# Patient Record
Sex: Female | Born: 1940 | Race: White | Hispanic: No | Smoking: Former smoker
Health system: Southern US, Community
[De-identification: ages and names within clinical notes are randomized; demographics above are authoritative.]

## PROBLEM LIST (undated history)

## (undated) DIAGNOSIS — I8500 Esophageal varices without bleeding: Secondary | ICD-10-CM

## (undated) DIAGNOSIS — K449 Diaphragmatic hernia without obstruction or gangrene: Secondary | ICD-10-CM

## (undated) DIAGNOSIS — K7460 Unspecified cirrhosis of liver: Secondary | ICD-10-CM

## (undated) DIAGNOSIS — R188 Other ascites: Secondary | ICD-10-CM

## (undated) DIAGNOSIS — K921 Melena: Secondary | ICD-10-CM

## (undated) DIAGNOSIS — N186 End stage renal disease: Secondary | ICD-10-CM

## (undated) DIAGNOSIS — I10 Essential (primary) hypertension: Secondary | ICD-10-CM

## (undated) DIAGNOSIS — K219 Gastro-esophageal reflux disease without esophagitis: Secondary | ICD-10-CM

## (undated) DIAGNOSIS — Z820 Family history of epilepsy and other diseases of the nervous system: Secondary | ICD-10-CM

## (undated) DIAGNOSIS — I5181 Takotsubo syndrome: Secondary | ICD-10-CM

## (undated) DIAGNOSIS — D649 Anemia, unspecified: Secondary | ICD-10-CM

## (undated) DIAGNOSIS — N189 Chronic kidney disease, unspecified: Secondary | ICD-10-CM

## (undated) DIAGNOSIS — M719 Bursopathy, unspecified: Secondary | ICD-10-CM

## (undated) DIAGNOSIS — N200 Calculus of kidney: Secondary | ICD-10-CM

## (undated) DIAGNOSIS — E119 Type 2 diabetes mellitus without complications: Secondary | ICD-10-CM

## (undated) DIAGNOSIS — C649 Malignant neoplasm of unspecified kidney, except renal pelvis: Secondary | ICD-10-CM

## (undated) DIAGNOSIS — Z9189 Other specified personal risk factors, not elsewhere classified: Secondary | ICD-10-CM

## (undated) DIAGNOSIS — K766 Portal hypertension: Secondary | ICD-10-CM

## (undated) DIAGNOSIS — E7800 Pure hypercholesterolemia, unspecified: Secondary | ICD-10-CM

## (undated) DIAGNOSIS — K2990 Gastroduodenitis, unspecified, without bleeding: Secondary | ICD-10-CM

## (undated) HISTORY — DX: Takotsubo syndrome: I51.81

## (undated) HISTORY — PX: HIP SURGERY: SHX245

## (undated) HISTORY — DX: Calculus of kidney: N20.0

## (undated) HISTORY — DX: Pure hypercholesterolemia, unspecified: E78.00

## (undated) HISTORY — PX: TENCKHOFF CATHETER INSERTION: SHX5251

## (undated) HISTORY — PX: BACK SURGERY: SHX140

## (undated) HISTORY — DX: Essential (primary) hypertension: I10

## (undated) HISTORY — PX: PARTIAL HYSTERECTOMY: SHX80

## (undated) HISTORY — DX: Bursopathy, unspecified: M71.9

## (undated) HISTORY — DX: Gastro-esophageal reflux disease without esophagitis: K21.9

## (undated) HISTORY — DX: Type 2 diabetes mellitus without complications: E11.9

## (undated) HISTORY — DX: Family history of epilepsy and other diseases of the nervous system: Z82.0

## (undated) HISTORY — PX: ABDOMINAL HYSTERECTOMY: SHX81

## (undated) HISTORY — PX: CHOLECYSTECTOMY: SHX55

---
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
## 1999-01-13 ENCOUNTER — Encounter: Admission: RE | Admit: 1999-01-13 | Discharge: 1999-04-13 | Payer: Self-pay | Admitting: Anesthesiology

## 2001-07-17 ENCOUNTER — Encounter: Payer: Self-pay | Admitting: Orthopedic Surgery

## 2001-07-17 ENCOUNTER — Ambulatory Visit (HOSPITAL_COMMUNITY): Admission: RE | Admit: 2001-07-17 | Discharge: 2001-07-17 | Payer: Self-pay | Admitting: Orthopedic Surgery

## 2001-11-28 ENCOUNTER — Encounter: Payer: Self-pay | Admitting: Family Medicine

## 2001-11-28 ENCOUNTER — Ambulatory Visit (HOSPITAL_COMMUNITY): Admission: RE | Admit: 2001-11-28 | Discharge: 2001-11-28 | Payer: Self-pay | Admitting: Family Medicine

## 2001-12-14 ENCOUNTER — Ambulatory Visit (HOSPITAL_COMMUNITY): Admission: RE | Admit: 2001-12-14 | Discharge: 2001-12-14 | Payer: Self-pay | Admitting: Internal Medicine

## 2001-12-14 HISTORY — PX: COLONOSCOPY: SHX174

## 2002-11-06 ENCOUNTER — Encounter: Payer: Self-pay | Admitting: Family Medicine

## 2002-11-06 ENCOUNTER — Ambulatory Visit (HOSPITAL_COMMUNITY): Admission: RE | Admit: 2002-11-06 | Discharge: 2002-11-06 | Payer: Self-pay | Admitting: Family Medicine

## 2003-01-30 ENCOUNTER — Encounter: Payer: Self-pay | Admitting: Family Medicine

## 2003-01-30 ENCOUNTER — Ambulatory Visit (HOSPITAL_COMMUNITY): Admission: RE | Admit: 2003-01-30 | Discharge: 2003-01-30 | Payer: Self-pay | Admitting: Family Medicine

## 2003-10-03 ENCOUNTER — Emergency Department (HOSPITAL_COMMUNITY): Admission: EM | Admit: 2003-10-03 | Discharge: 2003-10-03 | Payer: Self-pay | Admitting: Emergency Medicine

## 2003-10-05 ENCOUNTER — Inpatient Hospital Stay (HOSPITAL_COMMUNITY): Admission: EM | Admit: 2003-10-05 | Discharge: 2003-10-06 | Payer: Self-pay | Admitting: Emergency Medicine

## 2003-10-07 ENCOUNTER — Inpatient Hospital Stay (HOSPITAL_COMMUNITY): Admission: AD | Admit: 2003-10-07 | Discharge: 2003-10-09 | Payer: Self-pay | Admitting: Family Medicine

## 2003-10-14 ENCOUNTER — Ambulatory Visit (HOSPITAL_COMMUNITY): Admission: RE | Admit: 2003-10-14 | Discharge: 2003-10-14 | Payer: Self-pay | Admitting: Family Medicine

## 2005-06-15 ENCOUNTER — Ambulatory Visit: Payer: Self-pay | Admitting: Internal Medicine

## 2005-06-15 ENCOUNTER — Ambulatory Visit (HOSPITAL_COMMUNITY): Admission: RE | Admit: 2005-06-15 | Discharge: 2005-06-15 | Payer: Self-pay | Admitting: Internal Medicine

## 2005-06-15 HISTORY — PX: COLONOSCOPY: SHX174

## 2005-09-15 ENCOUNTER — Ambulatory Visit: Payer: Self-pay | Admitting: Orthopedic Surgery

## 2006-06-13 ENCOUNTER — Ambulatory Visit (HOSPITAL_COMMUNITY): Admission: RE | Admit: 2006-06-13 | Discharge: 2006-06-13 | Payer: Self-pay | Admitting: Family Medicine

## 2006-11-10 ENCOUNTER — Inpatient Hospital Stay (HOSPITAL_COMMUNITY): Admission: AD | Admit: 2006-11-10 | Discharge: 2006-11-14 | Payer: Self-pay | Admitting: Family Medicine

## 2007-01-02 ENCOUNTER — Ambulatory Visit (HOSPITAL_COMMUNITY): Admission: RE | Admit: 2007-01-02 | Discharge: 2007-01-02 | Payer: Self-pay | Admitting: Urology

## 2007-05-31 ENCOUNTER — Ambulatory Visit: Payer: Self-pay | Admitting: Internal Medicine

## 2007-06-12 ENCOUNTER — Ambulatory Visit: Payer: Self-pay | Admitting: Internal Medicine

## 2007-06-12 ENCOUNTER — Ambulatory Visit (HOSPITAL_COMMUNITY): Admission: RE | Admit: 2007-06-12 | Discharge: 2007-06-12 | Payer: Self-pay | Admitting: Internal Medicine

## 2007-06-12 HISTORY — PX: ESOPHAGOGASTRODUODENOSCOPY: SHX1529

## 2007-06-15 ENCOUNTER — Ambulatory Visit (HOSPITAL_COMMUNITY): Admission: RE | Admit: 2007-06-15 | Discharge: 2007-06-15 | Payer: Self-pay | Admitting: Family Medicine

## 2007-06-26 ENCOUNTER — Encounter (HOSPITAL_COMMUNITY): Admission: RE | Admit: 2007-06-26 | Discharge: 2007-07-26 | Payer: Self-pay | Admitting: Internal Medicine

## 2008-06-19 ENCOUNTER — Ambulatory Visit (HOSPITAL_COMMUNITY): Admission: RE | Admit: 2008-06-19 | Discharge: 2008-06-19 | Payer: Self-pay | Admitting: Family Medicine

## 2008-10-28 ENCOUNTER — Ambulatory Visit (HOSPITAL_COMMUNITY): Admission: RE | Admit: 2008-10-28 | Discharge: 2008-10-28 | Payer: Self-pay | Admitting: Family Medicine

## 2009-01-20 ENCOUNTER — Ambulatory Visit (HOSPITAL_COMMUNITY): Admission: RE | Admit: 2009-01-20 | Discharge: 2009-01-20 | Payer: Self-pay | Admitting: Family Medicine

## 2009-03-24 ENCOUNTER — Encounter (INDEPENDENT_AMBULATORY_CARE_PROVIDER_SITE_OTHER): Payer: Self-pay | Admitting: *Deleted

## 2009-05-14 DIAGNOSIS — I10 Essential (primary) hypertension: Secondary | ICD-10-CM | POA: Insufficient documentation

## 2009-05-14 DIAGNOSIS — K219 Gastro-esophageal reflux disease without esophagitis: Secondary | ICD-10-CM | POA: Insufficient documentation

## 2009-05-14 DIAGNOSIS — E7800 Pure hypercholesterolemia, unspecified: Secondary | ICD-10-CM | POA: Insufficient documentation

## 2009-05-14 DIAGNOSIS — N200 Calculus of kidney: Secondary | ICD-10-CM | POA: Insufficient documentation

## 2009-05-14 DIAGNOSIS — M7150 Other bursitis, not elsewhere classified, unspecified site: Secondary | ICD-10-CM | POA: Insufficient documentation

## 2009-05-14 DIAGNOSIS — K8020 Calculus of gallbladder without cholecystitis without obstruction: Secondary | ICD-10-CM | POA: Insufficient documentation

## 2009-05-14 DIAGNOSIS — E119 Type 2 diabetes mellitus without complications: Secondary | ICD-10-CM | POA: Insufficient documentation

## 2009-05-14 DIAGNOSIS — G43909 Migraine, unspecified, not intractable, without status migrainosus: Secondary | ICD-10-CM | POA: Insufficient documentation

## 2009-05-15 ENCOUNTER — Ambulatory Visit: Payer: Self-pay | Admitting: Internal Medicine

## 2009-05-15 DIAGNOSIS — K921 Melena: Secondary | ICD-10-CM | POA: Insufficient documentation

## 2009-05-15 DIAGNOSIS — Z862 Personal history of diseases of the blood and blood-forming organs and certain disorders involving the immune mechanism: Secondary | ICD-10-CM | POA: Insufficient documentation

## 2009-05-16 ENCOUNTER — Encounter: Payer: Self-pay | Admitting: Internal Medicine

## 2009-05-19 ENCOUNTER — Encounter: Payer: Self-pay | Admitting: Internal Medicine

## 2009-05-30 HISTORY — PX: COLONOSCOPY: SHX174

## 2009-06-02 ENCOUNTER — Ambulatory Visit: Payer: Self-pay | Admitting: Internal Medicine

## 2009-06-02 ENCOUNTER — Encounter: Payer: Self-pay | Admitting: Internal Medicine

## 2009-06-02 ENCOUNTER — Ambulatory Visit (HOSPITAL_COMMUNITY): Admission: RE | Admit: 2009-06-02 | Discharge: 2009-06-02 | Payer: Self-pay | Admitting: Internal Medicine

## 2009-06-06 ENCOUNTER — Encounter: Payer: Self-pay | Admitting: Internal Medicine

## 2009-06-24 ENCOUNTER — Ambulatory Visit (HOSPITAL_COMMUNITY): Admission: RE | Admit: 2009-06-24 | Discharge: 2009-06-24 | Payer: Self-pay | Admitting: Family Medicine

## 2009-08-07 ENCOUNTER — Ambulatory Visit (HOSPITAL_COMMUNITY): Admission: RE | Admit: 2009-08-07 | Discharge: 2009-08-07 | Payer: Self-pay | Admitting: Family Medicine

## 2009-08-08 ENCOUNTER — Ambulatory Visit (HOSPITAL_COMMUNITY): Admission: RE | Admit: 2009-08-08 | Discharge: 2009-08-08 | Payer: Self-pay | Admitting: Family Medicine

## 2009-09-04 ENCOUNTER — Ambulatory Visit (HOSPITAL_COMMUNITY): Admission: RE | Admit: 2009-09-04 | Discharge: 2009-09-04 | Payer: Self-pay | Admitting: Family Medicine

## 2010-06-13 ENCOUNTER — Emergency Department (HOSPITAL_COMMUNITY): Admission: EM | Admit: 2010-06-13 | Discharge: 2010-06-13 | Payer: Self-pay | Admitting: Emergency Medicine

## 2010-06-25 ENCOUNTER — Ambulatory Visit (HOSPITAL_COMMUNITY): Admission: RE | Admit: 2010-06-25 | Discharge: 2010-06-25 | Payer: Self-pay | Admitting: Family Medicine

## 2010-08-13 ENCOUNTER — Ambulatory Visit (HOSPITAL_COMMUNITY)
Admission: RE | Admit: 2010-08-13 | Discharge: 2010-08-13 | Payer: Self-pay | Source: Home / Self Care | Attending: Physical Medicine and Rehabilitation | Admitting: Physical Medicine and Rehabilitation

## 2010-09-04 ENCOUNTER — Encounter
Admission: RE | Admit: 2010-09-04 | Discharge: 2010-09-04 | Payer: Self-pay | Source: Home / Self Care | Attending: Unknown Physician Specialty | Admitting: Unknown Physician Specialty

## 2010-09-04 LAB — BASIC METABOLIC PANEL
BUN: 29 mg/dL — ABNORMAL HIGH (ref 6–23)
CO2: 26 mEq/L (ref 19–32)
Calcium: 9.2 mg/dL (ref 8.4–10.5)
Chloride: 103 mEq/L (ref 96–112)
Creatinine, Ser: 1.27 mg/dL — ABNORMAL HIGH (ref 0.4–1.2)
GFR calc Af Amer: 50 mL/min — ABNORMAL LOW (ref >60)
GFR calc non Af Amer: 42 mL/min — ABNORMAL LOW (ref >60)
Glucose, Bld: 166 mg/dL — ABNORMAL HIGH (ref 70–99)
Potassium: 4.9 mEq/L (ref 3.5–5.1)
Sodium: 137 mEq/L (ref 135–145)

## 2010-09-08 ENCOUNTER — Ambulatory Visit
Admission: RE | Admit: 2010-09-08 | Discharge: 2010-09-08 | Payer: Self-pay | Source: Home / Self Care | Attending: Unknown Physician Specialty | Admitting: Unknown Physician Specialty

## 2010-09-14 LAB — GLUCOSE, CAPILLARY
Glucose-Capillary: 156 mg/dL — ABNORMAL HIGH (ref 70–99)
Glucose-Capillary: 138 mg/dL — ABNORMAL HIGH (ref 70–99)

## 2010-09-14 LAB — POCT HEMOGLOBIN-HEMACUE: Hemoglobin: 9.4 g/dL — ABNORMAL LOW (ref 12.0–15.0)

## 2010-09-20 ENCOUNTER — Encounter: Payer: Self-pay | Admitting: Internal Medicine

## 2010-09-21 ENCOUNTER — Encounter: Payer: Self-pay | Admitting: Family Medicine

## 2010-12-03 LAB — CBC
Platelets: 302 10*3/uL (ref 150–400)
RBC: 3.86 MIL/uL — ABNORMAL LOW (ref 3.87–5.11)
WBC: 10.4 10*3/uL (ref 4.0–10.5)

## 2010-12-03 LAB — GLUCOSE, CAPILLARY: Glucose-Capillary: 107 mg/dL — ABNORMAL HIGH (ref 70–99)

## 2011-01-12 NOTE — H&P (Signed)
NAME:  [name], [name]           ACCOUNT NO.:  [id number]   MEDICAL RECORD NO.:  [id number]          PATIENT TYPE:  AMB   LOCATION:  DAY                           FACILITY:  [facility]   PHYSICIAN:  R. Garfield Cornea, M.D. DATE OF BIRTH:  1941/03/27   DATE OF ADMISSION:  DATE OF DISCHARGE:  LH                              HISTORY & PHYSICAL   REASON FOR CONSULTATION:  Hemoccult positive stool and refractory GERD.   HISTORY OF PRESENT ILLNESS:  [name] is a 70 year old Caucasian  female who has been having heartburn and indigestion as well as  rumination and regurgitation for about a year now.  She had been on PPI.  This was increased to Prevacid 30 mg b.i.d.  Recently, she continues to  complain of breakthrough symptoms, also complains of epigastric  tenderness and postprandial bloating.  She denies any nausea or  vomiting.  Denies any dysphagia or odynophagia.  Denies any anorexia or  early satiety.  She was started on Carafate 1 gram q.i.d. which does  seem to help some, although her symptoms are not completely resolved.  She generally has between one to two bowel movements a day.  Denies any  rectal bleeding or melena.  She is on aspirin 81 mg daily.  She was  found to have Hemoccult positive stools.  She is not on any other NSAID  at this time.   She has a family history of colorectal carcinoma.  She had her last high-  risk screening colonoscopy June 15, 2005, which showed minimal  internal hemorrhoids and otherwise normal exam.  Previous exam back in  2003 by [name] showed an inflammatory polyp at 30 and 35 cm which was  removed with inflammatory.  She has history of tubular adenoma on  colonoscopy in 2000.   Her weight is stable.   She had a CT scan abdomen that was with and without contrast on Jan 02, 2007, which showed bilateral renal calculi with bilateral renal cyst  including complicated cyst.  She was seen by [name] for this.  The  exam was otherwise  normal.  She had LFTs on February 02, 2007, which showed  an ALT of 41 otherwise normal.  She had a normal BMP except for a  glucose of 128 and a CBC which showed a white blood cell count of 11.4  on April 26, 2007.  She had a normal iron, TIBC and a ferritin of 53.   PAST MEDICAL AND SURGICAL HISTORY:  1. Chronic GERD.  2. Hypercholesterolemia.  3. Diabetes mellitus.  4. Hypertension.  5. Bursitis.  6. Migraine headaches.  7. Renal lithiasis.  She is followed by [name].  8. Partial hysterectomy.   1. She had right hip surgery.  2. She has had a cholecystectomy for cholelithiasis.   CURRENT MEDICATIONS:  1. Verapamil 240 mg daily.  2. Hydrochlorothiazide 25 mg daily.  3. Lipitor 20 mg daily.  4. Prevacid 30 mg b.i.d.  5. Clarinex 5 mg daily.  6. Carafate 1 gram a.c. and h.s.  7. Lovaza 2 grams b.i.d.  8. Darvocet N 100  p.r.n.  9. Aspirin 81 mg daily.  10.Metformin 500 mg b.i.d.   ALLERGIES:  NO KNOWN DRUG ALLERGIES.   FAMILY HISTORY:  Positive for a mother diagnosed with colon cancer in  her late 23s.  Father deceased due to pancreatic carcinoma at age 75.   SOCIAL HISTORY:  [name] is divorced, she lives alone.  She has  three healthy daughters.  She is retired from [employer].  She has a 30  pack-year history of tobacco use.  Denies any alcohol or drug use.   REVIEW OF SYSTEMS:  See HPI, otherwise negative.   PHYSICAL EXAMINATION:  VITAL SIGNS: Weight 129-1/2 pound, height 62  inches, temperature 98.3, blood pressure 120/80, pulse 88.  GENERAL:  [name] is a 70 year old Caucasian female who is alert,  oriented, pleasant, cooperative in no acute distress.  HEENT:  Sclerae are clear, nonicteric.  Conjunctivae are [name].  Oropharynx [name] and moist without lesions.  NECK:  Supple without thyromegaly.  CHEST:  Heart regular rhythm.  Normal Q000111Q no murmurs, clicks, rubs or  gallops.  LUNGS:  Clear to auscultation bilaterally.  ABDOMEN:  Positive bowel sounds  x4.  No bruits auscultated.  Soft,  nontender, nondistended without palpable mass or hepatosplenomegaly.  No  rebound or guarding.  EXTREMITIES:  Without clubbing or edema bilaterally.  SKIN:  [name], warm, dry without rash or jaundice.   IMPRESSION:  [name] is a 70 year old female with refractory GERD  symptoms and epigastric pain.  She has had some response to b.i.d. PPI  and Carafate, although not completely would be concerned with gastritis  or peptic ulcer disease.  She is also found to have Hemoccult positive  stool.  Her last colonoscopy was two years ago, and I do not feel she  needs repeat colonoscopy at this time.   PLAN:  1. EGD with [name] in the near future.  I have discussed the      procedure including risks and benefits which include but are not      limited to bleeding, infection, perforation, drug reaction.  She      agrees to plan and consent      will be obtained.  2. No aspirin 3 days prior to procedure.  3. Continue Prevacid 30 mg b.i.d.      [name], N.P.      [name], M.D.  Electronically Signed    KJ/MEDQ  D:  05/31/2007  T:  06/01/2007  Job:  [id number]   cc:   Marie Reaper A. Wolfgang Phoenix, MD  Fax: [phone]

## 2011-01-12 NOTE — Op Note (Signed)
NAME:  [name], [name]           ACCOUNT NO.:  [id number]   MEDICAL RECORD NO.:  [id number]          PATIENT TYPE:  AMB   LOCATION:  DAY                           FACILITY:  [facility]   PHYSICIAN:  R. Garfield Cornea, M.D. DATE OF BIRTH:  March 24, 1941   DATE OF PROCEDURE:  06/12/2007  DATE OF DISCHARGE:                               OPERATIVE REPORT   PROCEDURE:  Diagnostic esophagogastroduodenoscopy.   ENDOSCOPIST:  Cristopher Estimable. Rourk, M.D.   INDICATIONS FOR PROCEDURE:  A 70 year old lady with type 2 diabetes  mellitus with regurgitation, refractory reflux symptoms, and early  satiety.  She is Hemoccult-positive with positive family history of  colon cancer.  Last colonoscopy was 2 years ago without significant  findings.  From April 27, 2007 she is not anemic with H&H of 12.5 and  38.2, MCV 88.2.  She has had an melena, hematochezia, or hematemesis.  EGD is now being done.  This approach has been discussed with the  patient at length.  The potential risks, benefits, and alternatives have  been reviewed, questions answered, she is agreeable.  Please see the  documentation in the medical record.   PROCEDURE NOTE:  O2 saturation, blood pressure, pulse, and respirations  monitored throughout the entire procedure.   CONSCIOUS SEDATION:  Versed 3 mg IV, Demerol 75 mg IV in divided doses.   INSTRUMENT:  Pentax video chip system.   EGD FINDINGS:  Examination of the tubular esophagus revealed a patulous  EG junction.  The esophageal mucosa appeared entirely normal.  There was  a common cavity phenomena between the distal esophagus and stomach.  The  EG junction was very easily traversed.   STOMACH:  The gastric cavity was empty.  It insufflated well with air.  A thorough examination of the gastric mucosa including a retroflex view  of the proximal stomach and esophagogastric junction again demonstrated  a patulous EG junction with a moderate sized hiatal hernia, otherwise  gastric mucosa  appeared normal.  Pylorus was patent and easily  traversed.  Examination of the bulb and second portion revealed no  abnormalities.   THERAPEUTIC/DIAGNOSTIC MANEUVERS:  None.   The patient tolerated the procedure well and was reacted in endoscopy.   IMPRESSION:  Normal esophagus/patulous EG junction, moderate sized  hiatal hernia, otherwise normal stomach, D1, D2.   RECOMMENDATIONS:  1. Will proceed with a solid phase gastric emptying study to rule out      gastroparesis as a contributing factor to her upper GI tract      symptoms.  2. Continue Prevacid 30 mg orally b.i.d. for the time being.  3. We will have her return 3 more Hemoccult cards.  If any are      positive, would advocate a repeat colonoscopy in the future as      well.  4. Further recommendations to follow.      Bridgette Habermann, M.D.  Electronically Signed     RMR/MEDQ  D:  06/12/2007  T:  06/13/2007  Job:  [id number]   cc:   Nicki Reaper A. Wolfgang Phoenix, MD  Fax: [phone]

## 2011-01-15 NOTE — Discharge Summary (Signed)
[name], [name]           ACCOUNT NO.:  [id number]   MEDICAL RECORD NO.:  [id number]          PATIENT TYPE:  INP   LOCATION:  A332                          FACILITY:  [facility]   PHYSICIAN:  Margaretmary Eddy, M.D.DATE OF BIRTH:  [birth year]   DATE OF ADMISSION:  11/10/2006  DATE OF DISCHARGE:  03/17/2008LH                               DISCHARGE SUMMARY   FINAL DIAGNOSIS:  1. Pyelonephritis.  2. Type 2 diabetes.  3. Hypertension.  4. Renal stones.   DISPOSITION:  1. Patient discharged home.  2. Darvocet N-100 q. 04/06 p.r.n. for pain due to sensitivity to      hydrocodone.  3. Antibiotics to be selected based on culture results from the      office, of note culture here was negative.  However, this was after      the patient had already received an injection of Rocephin in the      office.  4. Follow up in the office next week for follow-up p.r.n. and revisit.   INITIAL HISTORY AND PHYSICAL:  Please see H&P as dictated   HOSPITAL COURSE:  This patient is a 70 year old white female with  history of type 2 diabetes, hypertension and a clinical pyelonephritis  diagnosed one month ago.  At that time, the patient grew out Proteus  which was sensitive to cephalosporins.  She was given a 10-day  prescription she felt much better.  She even had a follow-up urinalysis  which was normal.  She returned to the office on Thursday with fever,  flank pain and vomiting.  The patient was given injection Rocephin.  She  came back 6 hours later still feeling poorly.  She had a T-max of 103.  We admitted her to the hospital.  White blood count was 12,000.  Urinalysis showed 20-30 whites per high-power field.  We did a renal  ultrasound which showed a stone calculus in the left kidney area.  However, the patient has history of chronic stones.  There was no  obstructive stone present.  The patient was given IV fluids for  rehydration purposes along with IV Cipro and Rocephin.  Over the next  40  hours she improved nicely.  Her back pain diminished.  Her high fevers  came down to normal.  Today, the day of discharge, she still has slight  flank tenderness.  She has no nausea, eating well, feeling good.  She is  ready to go home.  The patient is discharged home with diagnosis and  disposition as noted above.  As noted, we will call an antibiotic to the  [facility] based on culture results in the office.      Margaretmary Eddy, M.D.  Electronically Signed     WSL/MEDQ  D:  11/14/2006  T:  11/14/2006  Job:  [id number]

## 2011-01-15 NOTE — Group Therapy Note (Signed)
NAME:  [name], [name]           ACCOUNT NO.:  [id number]   MEDICAL RECORD NO.:  [id number]          PATIENT TYPE:  INP   LOCATION:  [id number]                          FACILITY:  [facility]   PHYSICIAN:  Audria Nine, M.D.DATE OF BIRTH:  Apr 30, 1941   DATE OF PROCEDURE:  11/12/2006  DATE OF DISCHARGE:                                 PROGRESS NOTE   SUBJECTIVE:  [name] feels better today.  She denies any fevers or  chills.  Her back pain has improved. There was pain control although she  feels a little constipated and would like to have Dulcolax.   OBJECTIVE:  GENERAL:  Conscious, alert, comfortable not in acute  distress. VITAL SIGNS:  Blood pressure 109/62, pulse 76, respirations  20, temperature 97.6.  Blood sugars range between 160-170.  HEENT EXAM:  Normocephalic, atraumatic.  Oral mucosa was moist with no exudate.  NECK:  Supple.  No JVD.  LUNGS:  Clear clinically.  HEART:  S1-S2  regular.  ABDOMEN:  Soft.  EXTREMITIES:  No edema.   LABORATORY/DA GNOSTIC DATA:  White blood cell count was 11.2, hemoglobin  11.7, hematocrit 35.3, platelet count was 237 with no left shift.   ASSESSMENT AND PLAN:  Acute pyelonephritis, negative blood cultures thus  far.  The patient's temperature has improved.  Overall symptoms have  improved.  We will continue on current IV antibiotics with Ceftriaxone  and Cipro.  We do not have any identified organisms as of yet.  We will  give a Dulcolax suppository for her bowel movements.  Overall she  remains stable.      Audria Nine, M.D.  Electronically Signed     AM/MEDQ  D:  11/12/2006  T:  11/12/2006  Job:  [id number]

## 2011-01-15 NOTE — Group Therapy Note (Signed)
NAME:  KHLOEI, PALELLA           ACCOUNT NO.:  [id number]   MEDICAL RECORD NO.:  [id number]          PATIENT TYPE:  INP   LOCATION:  [id number]                          FACILITY:  [facility]   PHYSICIAN:  Audria Nine, M.D.DATE OF BIRTH:  01-31-1941   DATE OF PROCEDURE:  11/13/2006  DATE OF DISCHARGE:                                 PROGRESS NOTE   SUBJECTIVE:  The patient is doing fairly well, denies any fevers or  chills.  Back pain is improved.  She is yet to have any bowel movement.   OBJECTIVE:  GENERAL:  Conscious, alert, comfortable, in no acute  distress.   LABORATORY DATA:  White blood cell count is improved to 7.6, hemoglobin  10.8, hematocrit 32.2, platelet count 351 with no left shift.  Basic  metabolic profile was normal except for a glucose of 164, calcium was  8.8.   ASSESSMENT:  Acute pyelonephritis involving the left kidney.  The  patient has negative blood cultures thus far.  Temperature is improved.  Continue on IV antibiotics with Ceftriaxone and Cipro.  Also continue IV  fluids.  Dr. Wolfgang Phoenix will be back in the morning in time to see the  patient.      Audria Nine, M.D.  Electronically Signed     AM/MEDQ  D:  11/13/2006  T:  11/13/2006  Job:  [id number]

## 2011-01-15 NOTE — Discharge Summary (Signed)
NAME:  [name], Marie A                     ACCOUNT NO.:  o   MEDICAL RECORD NO.:  [id number]                   PATIENT TYPE:  INP   LOCATION:  A309                                 FACILITY:  [facility]   PHYSICIAN:  Micah Flesher. Halm, D.O.                DATE OF BIRTH:  [birth year]   DATE OF ADMISSION:  10/05/2003  DATE OF DISCHARGE:  10/06/2003                                 DISCHARGE SUMMARY   FINAL DIAGNOSES:  1. Epistaxis.  2. Anemia.  3. Hypovolemia.   BRIEF HISTORY:  The patient is a 70 year old female who presented to the  emergency room with recurrent and moderately severe epistaxis.  Hemostasis  was obtained in the emergency room without packing.  The patient was  orthostatic and anemic in the emergency room and was admitted to the  hospital for further observation.   HOSPITAL COURSE:  [name] was admitted for IV hydration.  She did well  in her 24 hours of being in the hospital.  She had no further epistaxis.  Her aspirin products and Clarinex were discontinued.  She was placed on  q.i.d. nasal saline solution and her activity was limited.   The patient had repeat hemoglobin on the day of discharge which revealed a  hemoglobin of 8.5 mg/dL, down from her presentation of 9.4.  This was likely  due to her acute bleeding and her hydration overnight.  Her vital signs  remained stable with blood pressure in the 123456 systolic range.  Her  heart rate came down from 112-80 with hydration.   The patient was discharged in stable condition.  Once we were sure she was  not going to continue to bleed.   DISCHARGE MEDICATIONS:  1. All of her previous medications including:     A. Verapamil.     B. Maxzide.     C. Darvocet.     D. Prevacid.     E. Lipitor.     F. Wellbutrin.  2. She was asked to discontinue her daily aspirin as well as Clarinex due to     its antihistamine and possible drying effects.  3. Prescription for Chromagen capsules to take once a day for the next  2-3     months for her anemia.  She is to get this filled if she is unable to     take over-the-counter iron sulfate 325 mg t.i.d. with orange juice.  4. She was asked to use saline nose spray q.i.d.   FOLLOWUP:  She is to see Dr. Wolfgang Phoenix in 3-5 days for recheck and to have  arrangements confirmed for an ENT evaluation which are apparently in  progress.     ___________________________________________                                         Micah Flesher.  Cleta Alberts, D.O.   [facility]/MEDQ  D:  10/06/2003  T:  10/07/2003  Job:  [id number]   cc:   Nicki Reaper A. Wolfgang Phoenix, M.D.  [address], [place]  [place] [address]  Fax: [phone]

## 2011-01-15 NOTE — Procedures (Signed)
NAME:  [name], [name]                     ACCOUNT NO.:  [id number]   MEDICAL RECORD NO.:  [id number]                   PATIENT TYPE:  INP   LOCATION:  A336                                 FACILITY:  [facility]   PHYSICIAN:  Margaretmary Eddy, M.D.             DATE OF BIRTH:  1940-10-28   DATE OF PROCEDURE:  10/07/2003  DATE OF DISCHARGE:  10/09/2003                                EKG INTERPRETATION   FINDINGS:  Electrocardiogram reveals normal sinus rhythm with nonspecific ST-  T changes.      ___________________________________________                                            Margaretmary Eddy, M.D.   WSL/MEDQ  D:  12/01/2003  T:  12/02/2003  Job:  [id number]

## 2011-01-15 NOTE — Op Note (Signed)
[facility]  Patient:    [name], [name] Visit Number: [id number] MRN: [id number]          Service Type: END Location: DAY Attending Physician:  Bridgette Habermann Dictated by:   Garfield Cornea, M.D. Proc. Date: 12/14/01 Admit Date:  12/14/2001   CC:         __________________   Operative Report  PROCEDURE:  Colonoscopy and snare polypectomy.  INDICATIONS FOR PROCEDURE:  The patient is a 70 year old lady with a history of adenomatous polyps removed from her left colon in 2000. She returns for surveillance. She is devoid of any lower GI tract symptoms. Colonoscopy as a surveillance maneuver has been discussed with [place] and the potential risks, benefits, and alternatives have been reviewed. Please see my handwritten H&P for more information.  MONITORING:  O2 saturation, blood pressure, pulse and respirations were monitored throughout the entirety of the procedure.  CONSCIOUS SEDATION:  Versed 3 mg IV, Demerol 75 mg IV in divided doses.  INSTRUMENT:  Olympus colonoscope.  FINDINGS:  Digital rectal examination revealed no abnormalities.  ENDOSCOPIC FINDINGS:  The prep was good.  RECTUM:  Examination of the rectal mucosa including retroflexed view of the anal verge revealed only internal hemorrhoids.  COLON:  The colonic mucosa was surveyed from the rectosigmoid junction through the left transverse right colon to the area of the appendiceal orifice, ileocecal valve and cecum. These structures were well seen and photographed. The patient was noted to have two 5 mm polyps at 30 and 35 cm. They were recovered with snare cautery. The remainder of the colonic mucosa appeared normal. From the level of the cecum and ileocecal valve (see photos), the scope was slowly and cautiously withdrawn. All previously mentioned mucosal surfaces were again seen. No other abnormalities were observed. The patient tolerated the procedure well and was  reactive.  IMPRESSION:  1. Internal hemorrhage, otherwise, normal rectum.  2. Polyps at 30 and 35 cm resected; the remainder of colonic mucosa appeared     normal.  RECOMMENDATIONS:  1. No aspirin or arthritis medications for 10 days.  2. Follow-up on path.  3. Further recommendations to follow. Dictated by:   Garfield Cornea, M.D. Attending Physician:  Bridgette Habermann DD:  12/14/01 TD:  12/14/01 Job: [phone] [id number]

## 2011-01-15 NOTE — H&P (Signed)
[name], [name]           ACCOUNT NO.:  [id number]   MEDICAL RECORD NO.:  [id number]          PATIENT TYPE:  INP   LOCATION:  [id number]                          FACILITY:  [facility]   PHYSICIAN:  Margaretmary Eddy, M.D.DATE OF BIRTH:  05/05/1941   DATE OF ADMISSION:  11/10/2006  DATE OF DISCHARGE:  LH                              HISTORY & PHYSICAL   CHIEF COMPLAINT:  Nausea, fever, back pain and abdominal pain.   SUBJECTIVE:  This patient is a 70 year old, white female with a history  of prior urinary tract infection a month ago, type 2 diabetes,  hypertension, reactive airways, hyperlipidemia, allergic rhinitis,  reflux and chronic back pain who presents to the office the day of  admission with acute distress. Over the last 48 hours, the patient has  developed nausea and flank pain.  This has been accompanied by fever and  chills.  The patient has felt very queasy with this. She has been aching  quite a bit and she notes a T-max of 101. Of note, the patient was just  treated for a urinary tract infection that was positive for Proteus a  month ago with Rocephin and then Ceftin which was sensitive. The patient  claims compliance with her current medications which include:   CURRENT MEDICATIONS:  1. Darvocet-N 100, 1 q.4-6 p.r.n. for mild pain.  2. Prevacid 30 mg 1 daily.  3. Clarinex 5 mg daily.  4. Lipitor 20 mg q.h.s.  5. Verapamil 240 mg p.o. q.a.m.  6. Hydrochlorothiazide 25 mg p.o. q.a.m.  7. Baby aspirin 81 mg 1 a day.  8. Ibuprofen p.r.n.  9. [place]or grams b.i.d.  10.Chantix 1 mg twice per day.  11.Glucophage 500 mg twice per day.   PAST SURGERIES:  Remote hysterectomy 1972.  Remote cholecystectomy, spur  heel surgery, remote epidurals, negative colonoscopy as of 2006.   FAMILY HISTORY:  Positive for colon cancer, hypertension, coronary  artery disease, pancreatic cancer and stroke.   ALLERGIES:  None known.   SOCIAL HISTORY:  The patient is divorced, unfortunately  has smoked up  until recently. No alcohol intake, three children.   REVIEW OF SYSTEMS:  Otherwise negative.   PHYSICAL EXAMINATION:  VITAL SIGNS:  Temperature 100.6, blood pressure  110/64,  significant malaise, weight 128, positive nausea.  HEENT:  Mild nasal congestion.  Pharynx normal.  NECK:  Supple.  LUNGS:  Clear.  No wheezes.  HEART:  Mild tachycardic.  ABDOMEN:  Diffuse mild pain, good bowel sounds, positive left flank  tenderness.  No significant right flank tenderness.  EXTREMITIES:  Thin.  NEUROLOGICAL:  No focal neurological deficits.   Urinalysis 6-8 whites per high-power field, blood work pending.   IMPRESSION:  1. Probable pyelonephritis.  2. Type 2 diabetes.  3. Hypertension.  4. Hyperlipidemia.  5. Chronic reflux.  6. History of reactive airways.   PLAN:  IV antibiotics, IV fluids.  Further orders as noted in the chart.      Margaretmary Eddy, M.D.  Electronically Signed     WSL/MEDQ  D:  11/10/2006  T:  11/11/2006  Job:  [id number]

## 2011-01-15 NOTE — H&P (Signed)
NAME:  [name], [name]                     ACCOUNT NO.:  [id number]   MEDICAL RECORD NO.:  [id number]                   PATIENT TYPE:  INP   LOCATION:  A309                                 FACILITY:  [facility]   PHYSICIAN:  Micah Flesher. Halm, D.O.                DATE OF BIRTH:  30-Dec-1940   DATE OF ADMISSION:  10/05/2003  DATE OF DISCHARGE:                                HISTORY & PHYSICAL   CHIEF COMPLAINT:  Bloody nose and weakness.   BRIEF HISTORY:  The patient is [name] 70 year old female who presents through the  emergency room with severe epistaxis.  The patient has had recurrent  episodes of epistaxis over the last two weeks and has nosebleeds every three  to five days during this time.  She states she has moderate amounts of blood  in previous episodes and this time she had severe amounts of blood in the  back of her throat and had difficulty stopping the bleeding.  In the  emergency room, she was orthostatic by blood pressure but really had no  significant symptoms of hypovolemia.  The patient was evaluated by the  emergency room doctor and noted to have mild anemia with [name] hemoglobin of 9.7  and [name] hematocrit of 28%.  Because of her orthostasis, tachycardia as well as  recurrent nosebleeds, we opted to admit [name] to the hospital for  further observation and recheck of her hemoglobin as well as intravenous  fluids and possible transfusion.   PAST MEDICAL HISTORY:  1. Non-insulin-dependent diabetes, diet controlled.  2. Hypertension.  3. Kidney stones.  4. Cholecystectomy.  5. Hysterectomy.  6. Hypercholesterolemia.  7. Gastroesophageal reflux.  8. Depression.   MEDICATIONS:  1. Wellbutrin XL formulation, she is unsure of the dose.  2. Lipitor.  We are unsure of the dose.  3. Prevacid, dose unsure of.  4. Clarinex one pill daily.  5. Verapamil daily.  6. Maxzide 25 one p.o. daily.  7. Aspirin 325 mg daily.  8. Motrin p.r.n.  9. Darvocet-N 100 p.r.n.   ALLERGIES:   No known drug allergies.   SOCIAL HISTORY:  The patient is [name] smoker, approximately one pack per day  most of her life.  She is living alone in an apartment where there is  electric heat.  She is divorced the last ten years and was married prior to  that for 30 years.  She denies any drug use or alcohol use.  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   REVIEW OF SYSTEMS:  The patient has had episodes of epistaxis as noted.  She  saw her family doctor yesterday as well as about [name] week ago.  Last week she  had cautery treatments performed to her nasal mucosa.  Arrangements were  being made for her to see an ENT specialist and are still pending.  She  denies any URI symptoms.  She has no GI  complaints other than being  nauseated with the clots in her throat and having swallowed some.  She also  has some complaints of [name] popping sensation in her left ear.   PHYSICAL EXAMINATION:  GENERAL:  The patient is in no distress.  She is very  cooperative and pleasant.  She is alert and oriented with [name] normal affect.  VITAL SIGNS:  Her blood pressure is 105/72, heart rate 112.  NOSE:  Shows evidence of fresh clots.  EARS:  She has [name] small effusion behind her left eardrum.  LUNGS:  She does have some mild wheezing noted on exam.  No rales or  rhonchi.  HEART:  Regular with no murmur.  NECK:  Supple with no adenopathy or thyromegaly or thyroid abnormality.  ABDOMEN:  Soft and nontender.  EXTREMITIES:  Unremarkable.  SKIN:  She does have rather pale-appearing skin but no obvious rash.   LABORATORY STUDIES:  Hemoglobin is 9.7 mg/dL, hematocrit 28%, platelets  413,000.  PT and PTT are normal.   IMPRESSION:  1. Epistaxis.  2. Anemia.  3. History of hypertension.  4. Diet controlled, diabetes.  5. Hypercholesterolemia.  6. Gastroesophageal reflux disease.   PLAN:  Admit [name] to the hospital for IV rehydration, and  observation for any recurrent bleeding.  We will provide her some  nasal  saline solution for hydration of the mucous membranes.  We will recheck her  hemoglobin and hematocrit in the morning and see if we can get by without  transfusing her.  If she is stable, we will plan to discharge her in the  next day or two and see the ear, nose and throat surgeons as an outpatient.   The overall plan has been reviewed with [name], and she is in  agreement.     ___________________________________________                                         Micah Flesher. Almyra Free  D:  10/05/2003  T:  10/05/2003  Job:  [id number]   cc:   Margaretmary Eddy, M.D.  [address] [place] [address]  Fax: [phone]

## 2011-01-15 NOTE — H&P (Signed)
NAME:  Marie [name], Marie [name]                     ACCOUNT NO.:  [id number]   MEDICAL RECORD NO.:  [id number]                   PATIENT TYPE:  INP   LOCATION:  A336                                 FACILITY:  [facility]   PHYSICIAN:  Margaretmary Eddy, M.D.             DATE OF BIRTH:  12-Aug-1941   DATE OF ADMISSION:  10/07/2003  DATE OF DISCHARGE:                                HISTORY & PHYSICAL   CHIEF COMPLAINT:  Vomiting and lightheadedness.   SUBJECTIVE:  This patient is [name] 70 year old female with [name] history of recent  epistaxis with multiple episodes over the past few weeks. Of note, she was  admitted to the hospital three days ago and spent the night after  experiencing [name] protracted bout of  nosebleeds. The patient was noted to be  orthostatic with [name] hemoglobin in the 9s. The patient was admitted overnight,  given IV fluids, and discharged home the following day, which was yesterday.  Today, the day of admission, the patient noted she was feeling relatively  well until the middle of the morning when she developed multiple episodes of  vomiting. This was accompanied by significant nausea. The patient did note  some abdominal discomfort in the left lower abdomen, but primarily her main  sensation was one of significant nausea and vomiting. Of note, the patient  had some achiness. She also noted some low-grade fever. The patient  mentioned no headache. She states that she is compliant with medications  which include Wellbutrin XL 150 mg daily, Lipitor daily, verapamil daily,  hydrochlorothiazide 25 mg daily, baby aspirin 81 mg daily.   ALLERGIES:  None unknown.   SOCIAL HISTORY:  The patient is [name] smoker, lives alone. She is divorced with  several children. No significant alcohol or drug use.   REVIEW OF SYSTEMS:  Otherwise negative.   PHYSICAL EXAMINATION:  VITAL SIGNS: BP 115/70, pulse rate in the range of  115-120. When the patient stands her systolic drops from [id number] to 94  diastolic.  She has obvious malaise, burping frequently, and initially  vomiting while in the exam room.  HEENT:  No bleeding currently from the nasal mucosa.  Mucous membranes  moist. Pharynx normal.  NECK: Supple.  LUNGS: Clear.  HEART: Regular rate and rhythm.  ABDOMEN: No significant tenderness to palpation in the upper abdomen.  However, in the lower abdomen the patient has significant discomfort.  PELVIC EXAM: The pain is localized primarily in the left lower quadrant  above the adnexal region. No CVA tenderness.  EXTREMITIES: Normal.   SIGNIFICANT LABORATORY:  CBC reveals 9.4 white blood count, hemoglobin 9.8  which is stable, platelet count normal. MET-7 reveals decent liver enzymes.  Urinalysis pending.   IMPRESSION:  1. Probable gastroenteritis. However, with left lower quadrant tenderness     must consider potential for diverticulitis. The patient is having [name] lot     of burping and belching, and symptoms of reflux accompanied by multiple  episodes of vomiting.  There is some bile staining noted to the vomiting.     The abdomen has good bowel sounds. The patient does not have any pain     reminiscent to any type of obstruction.  2. Epistaxis.  3. Anemia due to epistaxis with potential exacerbation of symptoms due to     anemia.  4. Hypertension, stable.  5. Chronic smoker.  6. Type 2 diabetes, controlled by diet alone.   PLAN:  As per orders.     ___________________________________________                                         Margaretmary Eddy, M.D.   WSL/MEDQ  D:  10/07/2003  T:  10/07/2003  Job:  [id number]

## 2011-01-15 NOTE — Op Note (Signed)
NAME:  Marie, [name]           ACCOUNT NO.:  [id number]   MEDICAL RECORD NO.:  [id number]          PATIENT TYPE:  AMB   LOCATION:  DAY                           FACILITY:  [facility]   PHYSICIAN:  R. Garfield Cornea, M.D. DATE OF BIRTH:  1941/05/08   DATE OF PROCEDURE:  06/15/2005  DATE OF DISCHARGE:                                 OPERATIVE REPORT   PROCEDURE:  High-risk colonoscopy.   INDICATIONS FOR PROCEDURE:  The patient is a 70 year old lady with no GI  symptoms whose family history is significant for her mother with colorectal  cancer. Colonoscopy is now being done. This approach has been discussed with  the patient at length. Potential risks, benefits, and alternatives have been  reviewed. She had colonoscopy approximately five years ago by me. She had a  couple of small polyps in her left colon removed. It turned out to be  inflammatory. Please see the documentation in the medical record.   PROCEDURE NOTE:  O2 saturation, blood pressure, pulse, and respirations were  monitored throughout the entire procedure. Conscious sedation with Versed 5  mg IV and Demerol 100 mg IV in divided doses.   INSTRUMENT:  Olympus video chip system.   FINDINGS:  Digital rectal exam revealed no abnormalities.   ENDOSCOPIC FINDINGS:  Prep was good.   Rectum:  Examination of the rectal mucosa including retroflexed view of the  anal verge revealed minimal internal hemorrhoids. Otherwise rectal mucosa  appeared normal.   Colon:  Colonic mucosa was surveyed from the rectosigmoid junction through  the left, transverse, and right colon to the area of the appendiceal  orifice, ileocecal valve, and cecum. These structures were well seen and  photographed for the record. From this level, the scope was slowly  withdrawn, and all previously mentioned mucosal surfaces were again seen.  The colonic mucosa appeared normal. The patient tolerated the procedure well  and was reactive to endoscopy.   IMPRESSION:  1.  Minimal internal hemorrhoids. Otherwise normal rectum.  2.  Normal colon.   RECOMMENDATIONS:  Repeat high-risk screening colonoscopy in five years.      Bridgette Habermann, M.D.  Electronically Signed     RMR/MEDQ  D:  06/15/2005  T:  06/15/2005  Job:  [id number]   cc:   Margaretmary Eddy, M.D.  Fax: [phone]

## 2011-01-15 NOTE — Discharge Summary (Signed)
NAME:  [name], [name]                     ACCOUNT NO.:  [id number]   MEDICAL RECORD NO.:  [id number]                   PATIENT TYPE:  INP   LOCATION:  A336                                 FACILITY:  [facility]   PHYSICIAN:  Margaretmary Eddy, M.D.             DATE OF BIRTH:  04-25-1941   DATE OF ADMISSION:  10/07/2003  DATE OF DISCHARGE:  10/09/2003                                 DISCHARGE SUMMARY   FINAL DIAGNOSES:  1. Viral gastritis.  2. Abdominal pain secondary to #1.  3. Recurrent epistaxis.  4. Anemia secondary to #3.  5. Hypertension.  6. Questionable pulmonary nodule on chest x-ray.   PLAN:  1. The patient is discharged home.  2. Hold blood pressure medications.  3. Maintain other chronic medications.  4. Hold aspirin.  5. Iron gluconate one tablet twice per day with food.  6. Follow up with ENT this afternoon at 3 p.m. as scheduled.  7. Follow up in the office next week.  8. Follow up with CT scan as scheduled.   HISTORY AND PHYSICAL:  Please see H&P as dictated.   HOSPITAL COURSE:  This patient is a 70 year old white female with history of  hypertension, diet controlled type II diabetes and recent significant  epistaxis with anemia and hospitalization for this, who presented to the  office the day of admission with profuse vomiting.  The patient also had  significant lower abdominal pain.  She was admitted to the hospital.  White  blood cell count was within normal limits.  The patient was started on  Levaquin due to potential for diverticulitis.  However, white blood cell  count remained normal.  Soon the vomiting abated and diarrhea ensued.  This  was felt to be consistent with viral gastroenteritis.  The patient's  hemoglobin remained stable in the 9's.  An abdominal series was  unremarkable.  Chest x-ray, however, found possible pulmonary nodule.  Radiology recommended a CT scan as follow up.  This is scheduled for next  week.  Today, the day of discharge, the  patient is feeling better.  We are  getting her home today with follow up as noted above and diagnosis and  disposition as noted above.     ___________________________________________                                         Margaretmary Eddy, M.D.   WSL/MEDQ  D:  10/09/2003  T:  10/09/2003  Job:  [id number]

## 2011-04-20 ENCOUNTER — Ambulatory Visit: Payer: Self-pay | Admitting: Family Medicine

## 2011-06-22 ENCOUNTER — Other Ambulatory Visit (HOSPITAL_COMMUNITY): Payer: Self-pay | Admitting: Family Medicine

## 2011-06-22 DIAGNOSIS — Z139 Encounter for screening, unspecified: Secondary | ICD-10-CM

## 2011-06-29 ENCOUNTER — Ambulatory Visit (HOSPITAL_COMMUNITY)
Admission: RE | Admit: 2011-06-29 | Discharge: 2011-06-29 | Disposition: A | Discharge status: Home / Self Care | Payer: Medicare Other | Source: Ambulatory Visit | Attending: Family Medicine | Admitting: Family Medicine

## 2011-06-29 DIAGNOSIS — Z139 Encounter for screening, unspecified: Secondary | ICD-10-CM

## 2011-06-29 DIAGNOSIS — Z1231 Encounter for screening mammogram for malignant neoplasm of breast: Secondary | ICD-10-CM | POA: Insufficient documentation

## 2012-05-19 ENCOUNTER — Other Ambulatory Visit (HOSPITAL_COMMUNITY): Payer: Self-pay | Admitting: Neurosurgery

## 2012-05-19 ENCOUNTER — Ambulatory Visit (HOSPITAL_COMMUNITY)
Admission: RE | Admit: 2012-05-19 | Discharge: 2012-05-19 | Disposition: A | Discharge status: Home / Self Care | Payer: Medicare Other | Source: Ambulatory Visit | Attending: Neurosurgery | Admitting: Neurosurgery

## 2012-05-19 DIAGNOSIS — W19XXXA Unspecified fall, initial encounter: Secondary | ICD-10-CM

## 2012-05-19 DIAGNOSIS — M4716 Other spondylosis with myelopathy, lumbar region: Secondary | ICD-10-CM

## 2012-05-19 DIAGNOSIS — N200 Calculus of kidney: Secondary | ICD-10-CM | POA: Insufficient documentation

## 2012-05-19 DIAGNOSIS — M546 Pain in thoracic spine: Secondary | ICD-10-CM | POA: Insufficient documentation

## 2012-05-30 ENCOUNTER — Other Ambulatory Visit (HOSPITAL_COMMUNITY): Payer: Self-pay | Admitting: Family Medicine

## 2012-05-30 DIAGNOSIS — Z139 Encounter for screening, unspecified: Secondary | ICD-10-CM

## 2012-06-29 ENCOUNTER — Ambulatory Visit (HOSPITAL_COMMUNITY)
Admission: RE | Admit: 2012-06-29 | Discharge: 2012-06-29 | Disposition: A | Discharge status: Home / Self Care | Payer: Medicare Other | Source: Ambulatory Visit | Attending: Family Medicine | Admitting: Family Medicine

## 2012-06-29 DIAGNOSIS — Z1231 Encounter for screening mammogram for malignant neoplasm of breast: Secondary | ICD-10-CM | POA: Insufficient documentation

## 2012-06-29 DIAGNOSIS — Z139 Encounter for screening, unspecified: Secondary | ICD-10-CM

## 2013-06-05 ENCOUNTER — Other Ambulatory Visit: Payer: Self-pay

## 2013-06-05 DIAGNOSIS — Z1231 Encounter for screening mammogram for malignant neoplasm of breast: Secondary | ICD-10-CM

## 2013-06-05 DIAGNOSIS — Z803 Family history of malignant neoplasm of breast: Secondary | ICD-10-CM

## 2013-06-11 ENCOUNTER — Encounter: Payer: Self-pay | Admitting: Internal Medicine

## 2013-06-27 ENCOUNTER — Ambulatory Visit: Payer: Medicare Other | Admitting: Gastroenterology

## 2013-06-30 DIAGNOSIS — I5181 Takotsubo syndrome: Secondary | ICD-10-CM

## 2013-06-30 HISTORY — DX: Takotsubo syndrome: I51.81

## 2013-07-02 ENCOUNTER — Inpatient Hospital Stay (HOSPITAL_COMMUNITY)
Admission: EM | Admit: 2013-07-02 | Discharge: 2013-07-06 | DRG: 871 | Disposition: A | Discharge status: Home / Self Care | Payer: Medicare Other | Source: Other Acute Inpatient Hospital | Attending: Internal Medicine | Admitting: Internal Medicine

## 2013-07-02 ENCOUNTER — Ambulatory Visit (HOSPITAL_COMMUNITY): Admit: 2013-07-02 | Payer: Self-pay | Admitting: Cardiovascular Disease

## 2013-07-02 ENCOUNTER — Encounter (HOSPITAL_COMMUNITY)
Admission: EM | Disposition: A | Discharge status: Home / Self Care | Payer: Self-pay | Source: Other Acute Inpatient Hospital | Attending: Internal Medicine

## 2013-07-02 ENCOUNTER — Encounter (HOSPITAL_COMMUNITY): Payer: Self-pay

## 2013-07-02 ENCOUNTER — Inpatient Hospital Stay (HOSPITAL_COMMUNITY): Payer: Medicare Other

## 2013-07-02 DIAGNOSIS — I5020 Unspecified systolic (congestive) heart failure: Secondary | ICD-10-CM | POA: Diagnosis present

## 2013-07-02 DIAGNOSIS — J189 Pneumonia, unspecified organism: Secondary | ICD-10-CM

## 2013-07-02 DIAGNOSIS — I214 Non-ST elevation (NSTEMI) myocardial infarction: Secondary | ICD-10-CM

## 2013-07-02 DIAGNOSIS — I10 Essential (primary) hypertension: Secondary | ICD-10-CM

## 2013-07-02 DIAGNOSIS — A419 Sepsis, unspecified organism: Principal | ICD-10-CM

## 2013-07-02 DIAGNOSIS — I219 Acute myocardial infarction, unspecified: Secondary | ICD-10-CM

## 2013-07-02 DIAGNOSIS — K219 Gastro-esophageal reflux disease without esophagitis: Secondary | ICD-10-CM

## 2013-07-02 DIAGNOSIS — D649 Anemia, unspecified: Secondary | ICD-10-CM | POA: Diagnosis present

## 2013-07-02 DIAGNOSIS — N182 Chronic kidney disease, stage 2 (mild): Secondary | ICD-10-CM | POA: Diagnosis present

## 2013-07-02 DIAGNOSIS — E876 Hypokalemia: Secondary | ICD-10-CM | POA: Diagnosis not present

## 2013-07-02 DIAGNOSIS — R5381 Other malaise: Secondary | ICD-10-CM | POA: Diagnosis present

## 2013-07-02 DIAGNOSIS — E119 Type 2 diabetes mellitus without complications: Secondary | ICD-10-CM

## 2013-07-02 DIAGNOSIS — I5181 Takotsubo syndrome: Secondary | ICD-10-CM | POA: Diagnosis present

## 2013-07-02 DIAGNOSIS — J9600 Acute respiratory failure, unspecified whether with hypoxia or hypercapnia: Secondary | ICD-10-CM

## 2013-07-02 DIAGNOSIS — I509 Heart failure, unspecified: Secondary | ICD-10-CM

## 2013-07-02 DIAGNOSIS — I319 Disease of pericardium, unspecified: Secondary | ICD-10-CM

## 2013-07-02 DIAGNOSIS — I4891 Unspecified atrial fibrillation: Secondary | ICD-10-CM | POA: Diagnosis present

## 2013-07-02 DIAGNOSIS — Z9189 Other specified personal risk factors, not elsewhere classified: Secondary | ICD-10-CM | POA: Insufficient documentation

## 2013-07-02 DIAGNOSIS — I5021 Acute systolic (congestive) heart failure: Secondary | ICD-10-CM

## 2013-07-02 DIAGNOSIS — E7800 Pure hypercholesterolemia, unspecified: Secondary | ICD-10-CM

## 2013-07-02 DIAGNOSIS — Z87891 Personal history of nicotine dependence: Secondary | ICD-10-CM

## 2013-07-02 DIAGNOSIS — I129 Hypertensive chronic kidney disease with stage 1 through stage 4 chronic kidney disease, or unspecified chronic kidney disease: Secondary | ICD-10-CM | POA: Diagnosis present

## 2013-07-02 HISTORY — DX: Other specified personal risk factors, not elsewhere classified: Z91.89

## 2013-07-02 HISTORY — PX: LEFT HEART CATHETERIZATION WITH CORONARY ANGIOGRAM: SHX5451

## 2013-07-02 LAB — CBC WITH DIFFERENTIAL/PLATELET
Basophils Absolute: 0 10*3/uL (ref 0.0–0.1)
Basophils Relative: 0 % (ref 0–1)
Eosinophils Relative: 0 % (ref 0–5)
HCT: 35.4 % — ABNORMAL LOW (ref 36.0–46.0)
MCHC: 33.3 g/dL (ref 30.0–36.0)
MCV: 87.2 fL (ref 78.0–100.0)
Monocytes Absolute: 1.8 10*3/uL — ABNORMAL HIGH (ref 0.1–1.0)
RDW: 15.2 % (ref 11.5–15.5)
WBC: 14.2 10*3/uL — ABNORMAL HIGH (ref 4.0–10.5)

## 2013-07-02 LAB — APTT: aPTT: 120 seconds — ABNORMAL HIGH (ref 24–37)

## 2013-07-02 LAB — COMPREHENSIVE METABOLIC PANEL
AST: 27 U/L (ref 0–37)
Albumin: 2.2 g/dL — ABNORMAL LOW (ref 3.5–5.2)
Alkaline Phosphatase: 165 U/L — ABNORMAL HIGH (ref 39–117)
BUN: 14 mg/dL (ref 6–23)
Calcium: 8.7 mg/dL (ref 8.4–10.5)
Chloride: 97 mEq/L (ref 96–112)
Creatinine, Ser: 1.24 mg/dL — ABNORMAL HIGH (ref 0.50–1.10)
GFR calc Af Amer: 49 mL/min — ABNORMAL LOW (ref >90)
GFR calc non Af Amer: 42 mL/min — ABNORMAL LOW (ref >90)
Glucose, Bld: 133 mg/dL — ABNORMAL HIGH (ref 70–99)

## 2013-07-02 LAB — POCT I-STAT 3, ART BLOOD GAS (G3+)
Acid-base deficit: 2 mmol/L (ref 0.0–2.0)
Bicarbonate: 23.7 mEq/L (ref 20.0–24.0)
O2 Saturation: 85 %
Patient temperature: 98.6
TCO2: 25 mmol/L (ref 0–100)
pCO2 arterial: 44.4 mmHg (ref 35.0–45.0)

## 2013-07-02 LAB — HEPARIN LEVEL (UNFRACTIONATED)
Heparin Unfractionated: 0.49 IU/mL (ref 0.30–0.70)
Heparin Unfractionated: 0.37 IU/mL (ref 0.30–0.70)

## 2013-07-02 LAB — MRSA PCR SCREENING: MRSA by PCR: NEGATIVE

## 2013-07-02 LAB — TROPONIN I: Troponin I: 0.64 ng/mL (ref <0.30)

## 2013-07-02 LAB — PRO B NATRIURETIC PEPTIDE: Pro B Natriuretic peptide (BNP): 30181 pg/mL — ABNORMAL HIGH (ref 0–125)

## 2013-07-02 LAB — GLUCOSE, CAPILLARY
Glucose-Capillary: 116 mg/dL — ABNORMAL HIGH (ref 70–99)
Glucose-Capillary: 156 mg/dL — ABNORMAL HIGH (ref 70–99)

## 2013-07-02 LAB — MAGNESIUM: Magnesium: 1.1 mg/dL — ABNORMAL LOW (ref 1.5–2.5)

## 2013-07-02 LAB — PROTIME-INR: Prothrombin Time: 13.7 seconds (ref 11.6–15.2)

## 2013-07-02 SURGERY — LEFT HEART CATHETERIZATION WITH CORONARY ANGIOGRAM
Anesthesia: LOCAL

## 2013-07-02 MED ORDER — VERAPAMIL HCL 2.5 MG/ML IV SOLN
INTRAVENOUS | Status: AC
  Filled 2013-07-02: qty 2

## 2013-07-02 MED ORDER — CHLORHEXIDINE GLUCONATE 0.12 % MT SOLN
15.0000 mL | Freq: Two times a day (BID) | OROMUCOSAL | Status: DC
  Administered 2013-07-02 – 2013-07-03 (×3): 15 mL via OROMUCOSAL
  Filled 2013-07-02 (×2): qty 15

## 2013-07-02 MED ORDER — HEPARIN (PORCINE) IN NACL 2-0.9 UNIT/ML-% IJ SOLN
INTRAMUSCULAR | Status: AC
  Filled 2013-07-02: qty 1000

## 2013-07-02 MED ORDER — FUROSEMIDE 10 MG/ML IJ SOLN
INTRAMUSCULAR | Status: AC
  Filled 2013-07-02: qty 4

## 2013-07-02 MED ORDER — ATORVASTATIN CALCIUM 40 MG PO TABS
40.0000 mg | ORAL_TABLET | Freq: Every day | ORAL | Status: DC
  Administered 2013-07-02 – 2013-07-05 (×4): 40 mg via ORAL
  Filled 2013-07-02 (×5): qty 1

## 2013-07-02 MED ORDER — LEVOFLOXACIN IN D5W 500 MG/100ML IV SOLN: 500.0000 mg | INTRAVENOUS | Status: DC

## 2013-07-02 MED ORDER — ASPIRIN 81 MG PO CHEW: 81.0000 mg | CHEWABLE_TABLET | ORAL | Status: AC

## 2013-07-02 MED ORDER — SODIUM CHLORIDE 0.9 % IJ SOLN
3.0000 mL | Freq: Two times a day (BID) | INTRAMUSCULAR | Status: DC
  Administered 2013-07-02: 09:00:00 via INTRAVENOUS
  Administered 2013-07-02: 3 mL via INTRAVENOUS

## 2013-07-02 MED ORDER — FUROSEMIDE 10 MG/ML IJ SOLN
40.0000 mg | Freq: Every day | INTRAMUSCULAR | Status: DC
  Administered 2013-07-02: 40 mg via INTRAVENOUS
  Filled 2013-07-02 (×2): qty 4

## 2013-07-02 MED ORDER — HEPARIN (PORCINE) IN NACL 100-0.45 UNIT/ML-% IJ SOLN
1050.0000 [IU]/h | INTRAMUSCULAR | Status: DC
  Administered 2013-07-03: 950 [IU]/h via INTRAVENOUS
  Filled 2013-07-02 (×2): qty 250

## 2013-07-02 MED ORDER — BIOTENE DRY MOUTH MT LIQD
15.0000 mL | Freq: Two times a day (BID) | OROMUCOSAL | Status: DC
  Administered 2013-07-02 – 2013-07-03 (×2): 15 mL via OROMUCOSAL

## 2013-07-02 MED ORDER — AMIODARONE HCL 150 MG/3ML IV SOLN
INTRAVENOUS | Status: AC
  Filled 2013-07-02: qty 3

## 2013-07-02 MED ORDER — NITROGLYCERIN IN D5W 200-5 MCG/ML-% IV SOLN: 2.0000 ug/min | INTRAVENOUS | Status: DC

## 2013-07-02 MED ORDER — NITROGLYCERIN IN D5W 200-5 MCG/ML-% IV SOLN
2.0000 ug/min | INTRAVENOUS | Status: DC
  Administered 2013-07-02: 5 ug/min via INTRAVENOUS

## 2013-07-02 MED ORDER — SODIUM CHLORIDE 0.9 % IV SOLN: 250.0000 mL | INTRAVENOUS | Status: DC | PRN

## 2013-07-02 MED ORDER — SODIUM CHLORIDE 0.9 % IJ SOLN: 3.0000 mL | INTRAMUSCULAR | Status: DC | PRN

## 2013-07-02 MED ORDER — ONDANSETRON HCL 4 MG/2ML IJ SOLN: 4.0000 mg | Freq: Four times a day (QID) | INTRAMUSCULAR | Status: DC | PRN

## 2013-07-02 MED ORDER — INSULIN ASPART 100 UNIT/ML ~~LOC~~ SOLN
0.0000 [IU] | Freq: Three times a day (TID) | SUBCUTANEOUS | Status: DC
  Administered 2013-07-03 (×2): 3 [IU] via SUBCUTANEOUS
  Administered 2013-07-03: 2 [IU] via SUBCUTANEOUS
  Administered 2013-07-04: 3 [IU] via SUBCUTANEOUS
  Administered 2013-07-04 – 2013-07-05 (×3): 2 [IU] via SUBCUTANEOUS
  Administered 2013-07-05 – 2013-07-06 (×2): 3 [IU] via SUBCUTANEOUS

## 2013-07-02 MED ORDER — NITROGLYCERIN 0.4 MG SL SUBL: 0.4000 mg | SUBLINGUAL_TABLET | SUBLINGUAL | Status: DC | PRN

## 2013-07-02 MED ORDER — FUROSEMIDE 10 MG/ML IJ SOLN
40.0000 mg | Freq: Once | INTRAMUSCULAR | Status: AC
  Administered 2013-07-02: 40 mg via INTRAVENOUS
  Filled 2013-07-02: qty 4

## 2013-07-02 MED ORDER — ASPIRIN EC 325 MG PO TBEC
325.0000 mg | DELAYED_RELEASE_TABLET | Freq: Every day | ORAL | Status: DC
  Administered 2013-07-03: 325 mg via ORAL
  Filled 2013-07-02 (×2): qty 1

## 2013-07-02 MED ORDER — SODIUM CHLORIDE 0.9 % IJ SOLN
3.0000 mL | Freq: Two times a day (BID) | INTRAMUSCULAR | Status: DC
  Administered 2013-07-02 – 2013-07-05 (×6): 3 mL via INTRAVENOUS

## 2013-07-02 MED ORDER — NITROGLYCERIN IN D5W 200-5 MCG/ML-% IV SOLN
INTRAVENOUS | Status: AC
  Filled 2013-07-02: qty 250

## 2013-07-02 MED ORDER — LIDOCAINE HCL (PF) 1 % IJ SOLN
INTRAMUSCULAR | Status: AC
  Filled 2013-07-02: qty 30

## 2013-07-02 MED ORDER — SODIUM CHLORIDE 0.9 % IJ SOLN
3.0000 mL | Freq: Two times a day (BID) | INTRAMUSCULAR | Status: DC
  Administered 2013-07-03: 3 mL via INTRAVENOUS

## 2013-07-02 MED ORDER — METOPROLOL TARTRATE 25 MG/10 ML ORAL SUSPENSION
6.2500 mg | Freq: Four times a day (QID) | ORAL | Status: DC
  Administered 2013-07-02 – 2013-07-03 (×5): 6.25 mg via ORAL
  Filled 2013-07-02 (×15): qty 2.5

## 2013-07-02 MED ORDER — HEPARIN SODIUM (PORCINE) 1000 UNIT/ML IJ SOLN
INTRAMUSCULAR | Status: AC
  Filled 2013-07-02: qty 1

## 2013-07-02 MED ORDER — MORPHINE SULFATE 2 MG/ML IJ SOLN
INTRAMUSCULAR | Status: AC
  Filled 2013-07-02: qty 1

## 2013-07-02 MED ORDER — NITROGLYCERIN 0.2 MG/ML ON CALL CATH LAB
INTRAVENOUS | Status: AC
  Filled 2013-07-02: qty 1

## 2013-07-02 MED ORDER — CHLORHEXIDINE GLUCONATE 0.12 % MT SOLN
OROMUCOSAL | Status: AC
  Administered 2013-07-02: 15 mL
  Filled 2013-07-02: qty 15

## 2013-07-02 MED ORDER — MAGNESIUM SULFATE 50 % IJ SOLN
6.0000 g | Freq: Once | INTRAMUSCULAR | Status: AC
  Administered 2013-07-02: 6 g via INTRAVENOUS
  Filled 2013-07-02: qty 12

## 2013-07-02 MED ORDER — ACETAMINOPHEN 325 MG PO TABS: 650.0000 mg | ORAL_TABLET | ORAL | Status: DC | PRN

## 2013-07-02 MED ORDER — LEVOFLOXACIN IN D5W 500 MG/100ML IV SOLN
500.0000 mg | INTRAVENOUS | Status: DC
  Administered 2013-07-03: 500 mg via INTRAVENOUS
  Filled 2013-07-02: qty 100

## 2013-07-02 MED ORDER — SODIUM PHOSPHATE 3 MMOLE/ML IV SOLN: 10.0000 mmol | Freq: Once | INTRAVENOUS | Status: DC

## 2013-07-02 MED ORDER — ASPIRIN EC 81 MG PO TBEC: 81.0000 mg | DELAYED_RELEASE_TABLET | Freq: Every day | ORAL | Status: DC

## 2013-07-02 MED ORDER — AMIODARONE LOAD VIA INFUSION
150.0000 mg | Freq: Once | INTRAVENOUS | Status: AC
  Administered 2013-07-02: 150 mg via INTRAVENOUS
  Filled 2013-07-02: qty 83.34

## 2013-07-02 MED ORDER — AMIODARONE HCL IN DEXTROSE 360-4.14 MG/200ML-% IV SOLN
30.0000 mg/h | INTRAVENOUS | Status: DC
  Administered 2013-07-03 (×2): 30 mg/h via INTRAVENOUS
  Filled 2013-07-02 (×7): qty 200

## 2013-07-02 MED ORDER — AMIODARONE HCL IN DEXTROSE 360-4.14 MG/200ML-% IV SOLN
60.0000 mg/h | INTRAVENOUS | Status: AC
  Filled 2013-07-02 (×2): qty 200

## 2013-07-02 MED ORDER — PIPERACILLIN-TAZOBACTAM 3.375 G IVPB
3.3750 g | Freq: Three times a day (TID) | INTRAVENOUS | Status: DC
  Administered 2013-07-02 – 2013-07-03 (×4): 3.375 g via INTRAVENOUS
  Filled 2013-07-02 (×7): qty 50

## 2013-07-02 MED ORDER — ASPIRIN 81 MG PO CHEW
81.0000 mg | CHEWABLE_TABLET | ORAL | Status: AC
  Administered 2013-07-02: 81 mg via ORAL
  Filled 2013-07-02: qty 1

## 2013-07-02 MED ORDER — HEPARIN (PORCINE) IN NACL 100-0.45 UNIT/ML-% IJ SOLN
960.0000 [IU]/h | INTRAMUSCULAR | Status: DC
  Filled 2013-07-02: qty 250

## 2013-07-02 MED ORDER — MORPHINE SULFATE 2 MG/ML IJ SOLN
2.0000 mg | INTRAMUSCULAR | Status: DC | PRN
  Administered 2013-07-02 – 2013-07-03 (×3): 2 mg via INTRAVENOUS
  Filled 2013-07-02 (×2): qty 1

## 2013-07-02 MED ORDER — SODIUM CHLORIDE 0.9 % IV SOLN: INTRAVENOUS | Status: DC

## 2013-07-02 MED ORDER — METOPROLOL TARTRATE 12.5 MG HALF TABLET: 6.2500 mg | ORAL_TABLET | Freq: Four times a day (QID) | ORAL | Status: DC

## 2013-07-02 MED ORDER — ACETAMINOPHEN 325 MG PO TABS
650.0000 mg | ORAL_TABLET | ORAL | Status: DC | PRN
  Administered 2013-07-03: 650 mg via ORAL
  Filled 2013-07-02: qty 2

## 2013-07-02 MED ORDER — ALPRAZOLAM 0.25 MG PO TABS
0.2500 mg | ORAL_TABLET | Freq: Three times a day (TID) | ORAL | Status: DC | PRN
  Administered 2013-07-02 – 2013-07-04 (×4): 0.25 mg via ORAL
  Filled 2013-07-02 (×4): qty 1

## 2013-07-02 MED FILL — Heparin Sodium (Porcine) 100 Unt/ML in Sodium Chloride 0.45%: INTRAMUSCULAR | Qty: 250 | Status: AC

## 2013-07-02 NOTE — Progress Notes (Signed)
Received patient from [facility] around 0025, transfer from [facility] to room 2H05. Patient has heparin infusing at 960 units per hour and Nitroglycerin gtt at 5 mcq per hour. EKG done and MD performed a bedside ultrasound of the heart. MD is in the process of writing orders now.

## 2013-07-02 NOTE — Consult Note (Signed)
PULMONARY  / CRITICAL CARE MEDICINE  Name: [name] MRN: [id number] DOB: 1941-01-25    ADMISSION DATE:  07/02/2013 CONSULTATION DATE:  07/02/2013  REFERRING MD :  Bensimhon PRIMARY SERVICE: Cardiology  CHIEF COMPLAINT:  SOB  BRIEF PATIENT DESCRIPTION: 72 y.o. F with DM2, HTN, HLD, presented to [facility] 10/30 and found to have RLL PNA and sepsis.  Developed chest pain, SOB and had troponin bump with EKG changes & diffuse infiltrates c/w edema.  Transferred to [facility] for PCI. Developed AF-RVR & worse hypoxia post cath - Cardiology consulted PCCM for further management of her PNA.  SIGNIFICANT EVENTS / STUDIES:  10/30 - Admitted to [facility] for PNA, in ICU. 11/1 - Transferred out of ICU but developed SOB and chest pain.  LINES / TUBES: Foley 11/2>>>  CULTURES: Blood 10/30 [facility]) >>  ANTIBIOTICS: Levaquin 11/3 >>> Zosyn 11/3 >>>  HISTORY OF PRESENT ILLNESS:  [name] is a 72 y.o. F with a history of non-insulin dependant DMII, HTN, Hyperlipidemia, who presented to [facility] 10/30 with a multiofocal pneumonia and sepsis. She had been in her usual state of health, and the day prior had walked 2 miles with her daughter. She was initially admitted to the ICU and treated with IV abx and fluids. Also with acute on chronic kidney injury, anemia on presentation (hgb 7), treated with 2 units PRBCs.  She was transferred out of the ICU on 11/1, however around 5pm that evening she developed some SOB, and chest/abd pain. On 11/3, her SOB progressed to the point that she required a 100% NRB and transfer back to ICU. BP remained stable but her chest pain persisted. Troponin was checked and was elevated along with elevated BNP, JVP on exam, and likely pulmonary edema, all consistent with CHF. She was transferred to the [facility] CCU for further care on BiPAP and nitro gtt.  At [facility], EKG had changes with diffuse TWI's, prompting Cardiology to take her to the  Cath lab for PCI.  Cath was negative for any ischemia, findings suggestive of likely Takatsubo disease.  Pt also went into rapid Afib during the case and was treated with amiodarone. Pt transferred back to the CCU on BiPAP. During our exam, pt reported that she was not SOB at all and was only having some mild left sided chest pain.  Denied any cough or other URI symptoms.  Did state that prior to her presentation at [facility], she felt as if she had a kidney infection since she was having to urinate much more than usual.  She did deny any burning however.  Denied any history of PNA or exposure to known sick contacts.  Pt has had the flu shot this year.  Was a former smoker, quit over 10 years ago.  Prior to that, she smoked roughly 1 ppd since she was roughly 72 years old.   PAST MEDICAL HISTORY :  Past Medical History  Diagnosis Date  . GERD (gastroesophageal reflux disease)   . Hypercholesterolemia   . Diabetes mellitus   . Hypertension   . Bursitis   . FHx: migraine headaches   . Renal lithiasis    Past Surgical History  Procedure Laterality Date  . Partial hysterectomy    . Hip surgery      right  . Cholecystectomy    . Esophagogastroduodenoscopy  06/12/2007    [id number] esophagus/patulous EG junction, moderate sized  hiatal hernia, otherwise normal stomach, D1, D2  . Colonoscopy  06/15/2005  RMR: Minimal internal hemorrhoids. Otherwise normal rectum/Normal colon  . Colonoscopy    12/14/2001    RMR: Internal hemorrhage, otherwise, normal rectum/ Polyps at 30 and 35 cm resected; the remainder of colonic mucosa appeared normal   Prior to Admission medications   Medication Sig Start Date End Date Taking? Authorizing Provider  atorvastatin (LIPITOR) 20 MG tablet Take 20 mg by mouth daily.   Yes Historical Provider, MD  FLUoxetine (PROZAC) 40 MG capsule Take 40 mg by mouth daily.   Yes Historical Provider, MD  gabapentin (NEURONTIN) 100 MG capsule Take 100 mg by mouth 3 (three)  times daily.   Yes Historical Provider, MD  lisinopril-hydrochlorothiazide (PRINZIDE,ZESTORETIC) 10-12.5 MG per tablet Take 1 tablet by mouth daily.   Yes Historical Provider, MD  omega-3 acid ethyl esters (LOVAZA) 1 G capsule Take 2 g by mouth 2 (two) times daily.   Yes Historical Provider, MD  omeprazole (PRILOSEC) 20 MG capsule Take 20 mg by mouth daily.   Yes Historical Provider, MD  oxyCODONE-acetaminophen (PERCOCET/ROXICET) 5-325 MG per tablet Take 1 tablet by mouth every 6 (six) hours as needed for pain.   Yes Historical Provider, MD  Probiotic Product ([place]) Take 1 tablet by mouth daily.   Yes Historical Provider, MD  sulfamethoxazole-trimethoprim (SMZ-TMP DS) 800-160 MG per tablet Take 1 tablet by mouth 2 (two) times daily.   Yes Historical Provider, MD  tiZANidine (ZANAFLEX) 4 MG tablet Take 4 mg by mouth every 6 (six) hours as needed.   Yes Historical Provider, MD  traZODone (DESYREL) 50 MG tablet Take 50 mg by mouth at bedtime.   Yes Historical Provider, MD   Allergies  Allergen Reactions  . Codeine     FAMILY HISTORY:  No family history on file. SOCIAL HISTORY:  does not have a smoking history on file. She has quit using smokeless tobacco. Her alcohol and drug histories are not on file.  REVIEW OF SYSTEMS:  Negative except as stated in HPI.  SUBJECTIVE:  Endorses continued mild left sided chest pain.  Denies SOB, cough, fevers.    VITAL SIGNS: Temp:  [97.6 F (36.4 C)-98.8 F (37.1 C)] 98.8 F (37.1 C) (11/03 0730) Pulse Rate:  [80-96] 95 (11/03 1253) Resp:  [10-36] 17 (11/03 1200) BP: (104-146)/(67-92) 104/78 mmHg (11/03 1200) SpO2:  [86 %-96 %] 94 % (11/03 1200) FiO2 (%):  [50 %-60 %] 60 % (11/03 0600) Weight:  [123 lb 14.4 oz (56.2 kg)] 123 lb 14.4 oz (56.2 kg) (11/03 0030) HEMODYNAMICS:   VENTILATOR SETTINGS: Vent Mode:  [-]  FiO2 (%):  [50 %-60 %] 60 % INTAKE / OUTPUT: Intake/Output     11/02 0701 - 11/03 0700 11/03 0701 - 11/04 0700    I.V. (mL/kg) 69.4 (1.2) 63 (1.1)   IV Piggyback 25 25   Total Intake(mL/kg) 94.4 (1.7) 88 (1.6)   Urine (mL/kg/hr) 2100 1850 (4.2)   Total Output 2100 1850   Net -2005.6 -1762        Stool Occurrence  1 x     PHYSICAL EXAMINATION: General:  Pleasant female, lying in bed, in NAD. Neuro:  A&O x 3.  Mood and affect appropriate. HEENT:  [place]/AT.  PERRL.  MMM Cardiovascular:  RRR.  No M/R/G. Lungs:  Resp's even and unlabored.  Decreased breath sounds in right base, mild crackles in left.  No W/R/R. Abdomen:  BS x 4.  Soft, NT/ND. Musculoskeletal:  No gross deformities.  No edema. Skin:  Warm and dry.  LABS:  CBC Recent Labs     07/02/13  0224  WBC  14.2*  HGB  11.8*  HCT  35.4*  PLT  208   Coag's Recent Labs     07/02/13  0224  APTT  120*  INR  1.07   BMET Recent Labs     07/02/13  0224  NA  132*  K  4.3  CL  97  CO2  23  BUN  14  CREATININE  1.24*  GLUCOSE  133*   Electrolytes Recent Labs     07/02/13  0224  CALCIUM  8.7  MG  1.1*   ABG Recent Labs     07/02/13  0210  PHART  7.335*  PCO2ART  44.4  PO2ART  54.0*   Liver Enzymes Recent Labs     07/02/13  0224  AST  27  ALT  38*  ALKPHOS  165*  BILITOT  0.5  ALBUMIN  2.2*   Cardiac Enzymes Recent Labs     07/02/13  0224  07/02/13  0737  TROPONINI  0.64*  0.72*  PROBNP  30181.0*   --    Glucose Recent Labs     07/02/13  0750  GLUCAP  116*    Imaging Dg Chest Port 1 View  07/02/2013   CLINICAL DATA:  Hypoxia.  EXAM: PORTABLE CHEST - 1 VIEW  COMPARISON:  CHEST x-ray 07/01/2013.  FINDINGS: Diffuse interstitial prominence and patchy predominantly perihilar airspace disease, concerning for a background of moderate pulmonary edema. There is more focal airspace consolidation in the periphery of the right lung at the base, which may suggest superimposed infection or sequela of aspiration. Probable small a moderate right-sided pleural effusion. Heart size appears borderline enlarged. The  patient is rotated to the right on today's exam, resulting in distortion of the mediastinal contours and reduced diagnostic sensitivity and specificity for mediastinal pathology.  IMPRESSION: 1. The appearance of the chest is similar to the prior study. There appears to be a background of moderate pulmonary edema, but there is a more focal airspace consolidation at the right base concerning for superimposed infection or sequela of aspiration, likely with moderate right-sided pleural effusion.   Electronically Signed   By: Vinnie Langton M.D.   On: 07/02/2013 02:23      ASSESSMENT / PLAN:  PULMONARY A: Community Acquired Pneumonia Hypoxia - in the setting of CAP. Pleural Effusion - Concern for parapneumonic effusion. Mild Pulmonary Edema -  P:   - Cont empiric Abx until tomorrow AM (Levaquin and Zosyn).  If WBC drops and pt continues to improve clinically, will change to Azithromycin. - Supplemental O2 to maintain SpO2 > 93% - f/u CXR in AM, monitor effusion.  If worsens, could possibly need thoracentesis. - IS/pulmonary hygiene. - OOB as tolerated with assist.  INFECTIOUS A:  Leukocytosis - presumed secondary to CAP given hypoxia and CXR findings. Pneumonia P:   - Cont Abx as above. - Monitor WBC's/fever curve.  CARDIOVASCULAR A:  Chest Pain - s/p cath lab, found to have no ischemia but likely Takatsubo Disease. Takatsubo Disease - given clear cath Atrial Fibrillation - s/p cardiac cath, on Amiodarone now. Troponin Leak - Presumed secondary to Takatsubo disease. P:  - follow Troponins - manage per Cardiology.  Appreciate their input.  RENAL A:  AKI - Unclear etiology.  Cr down from admission at [facility].   P:   - Monitor BMP.  If Cr continues to climb, will seek nephrology input.  GASTROINTESTINAL  A:  No acute issue. P:  - SUP:  Pantoprazole  HEMATOLOGIC A:  Anemia - improved. P:   - Stable since admission at [facility] - f/u CBC in AM.  ENDOCRINE A:  DM II  - Chronic. P:   - SSI  NEUROLOGIC A:  No acute issue. P:   - Monitor.  Montey Hora, PA - S  I have personally obtained a history, examined the patient, evaluated laboratory and imaging results, formulated the assessment and plan and placed orders. CRITICAL CARE: The patient is critically ill with multiple organ systems failure and requires high complexity decision making for assessment and support, frequent evaluation and titration of therapies, application of advanced monitoring technologies and extensive interpretation of multiple databases. Critical Care Time devoted to patient care services described in this note is 45 minutes.   Rigoberto Noel  Pulmonary and [place] Pager: [phone]  07/02/2013, 2:51 PM

## 2013-07-02 NOTE — Progress Notes (Addendum)
ANTICOAGULATION/ANTIBIOTIC CONSULT NOTE - Initial Consult  Pharmacy Consult for heparin/zosyn Indication: chest pain/ACS/ PNA, sepsis  Allergies not on file  Patient Measurements: Height: 5\' 2"  (157.5 cm) Weight: 123 lb 14.4 oz (56.2 kg) IBW/kg (Calculated) : 50.1   Vital Signs: Temp: 98.3 F (36.8 C) (11/03 0030) Temp src: Axillary (11/03 0030) BP: 116/73 mmHg (11/03 0115) Pulse Rate: 84 (11/03 0115)  Labs: No results found for this basename: HGB, HCT, PLT, APTT, LABPROT, INR, HEPARINUNFRC, CREATININE, CKTOTAL, CKMB, TROPONINI,  in the last 72 hours  Estimated Creatinine Clearance: 31.2 ml/min (by C-G formula based on Cr of 1.27).   Medical History: Past Medical History  Diagnosis Date  . GERD (gastroesophageal reflux disease)   . Hypercholesterolemia   . Diabetes mellitus   . Hypertension   . Bursitis   . FHx: migraine headaches   . Renal lithiasis     Medications:  See med history  Assessment: 72 yo lady started on heparin at [facility] for ACS.  Her Hg was 11.3 and PTLC 165.  (s/p blood products) She received heparin bolus 3500 units and drip at 960 units/hr at 22:05 11/2.  She was also started on levaquin and zosyn at [facility]   Goal of Therapy:  Heparin level 0.3-0.7 units/ml Monitor platelets by anticoagulation protocol: Yes Eradication of infection   Plan:  Continue heparin at 960 units/hr Check heparin level at 06:00. Daily heparin level and CBC while on heparin. Zosyn 3.375 gm IV q8 hours F/u cultures, clinical course and renal function.  Aritzel Krusemark Poteet 07/02/2013,1:53 AM  Addum:  Heparin level 0.49 units/ml.  Drawn a little early.  Will cont same rate and recheck in ~6 hours.

## 2013-07-02 NOTE — Care Management Note (Addendum)
    Page 1 of 2   07/06/2013     2:05:25 PM   CARE MANAGEMENT NOTE 07/06/2013  Patient:  [name], [name]   Account Number:  [id number]  Date Initiated:  07/02/2013  Documentation initiated by:  Elissa Hefty  Subjective/Objective Assessment:   adm w mi     Action/Plan:   lives alone, pcp dr Coralyn Mark daniels   Anticipated DC Date:  07/06/2013   Anticipated DC Plan:  [place]  CM consult      [place]   Choice offered to / List presented to:  C-1 Patient        [place] arranged  HH-1 RN  [place].   Status of service:  Completed, signed off Medicare Important Message given?   (If response is "NO", the following Medicare IM given date fields will be blank) Date Medicare IM given:   Date Additional Medicare IM given:    Discharge Disposition:  [place]  Per UR Regulation:  Reviewed for med. necessity/level of care/duration of stay  If discussed at [place] of Stay Meetings, dates discussed:    Comments:  07/06/13- 1400- Marvetta Gibbons RN, BSN [phone] Pt for d/c today, spoke with daughter who has concerns about mother at home alone and management diseases and medications- will arrange [place]- pt agreeable- states she has used [facility] in past- per choice would like to use them again. Order for [facility]- referral called to Butch Penny with [facility] for [facility] services- services to begin within 24-48 hr post discharge.

## 2013-07-02 NOTE — Progress Notes (Signed)
    S:  CTSP 2/2 worsening chest pain this AM despite IV Morphine and initiation and titration of IV ntg.  Pt says that she has had constant left sided chest pain for several days but that it has been worse this AM.  Pain is somewhat worse with deep breathing.  Hemodynamically stable.  O:   Filed Vitals:   07/02/13 1000  BP: 120/82  Pulse: 93  Temp:   Resp: 12   Pleasant, restless, clutching her left chest.  NRB in place.  AAOx3.  Lungs with bibasilar crackles, scattered rhonchi.  Cor rrr, Abd soft, nt/nd/bs+x4, ext no cce.  A/P:  1.  NSTEMI:  Pt presented on tx from [place] overnight after presentation there with pna/sepsis/resp failure and anemia req prbc's.  She developed chest pain @ [facility] and was transferred here for further eval.  Since arrival here, she has been having worsening c/p recalcitrant to iv ntg and morphine.  ECG now shows antlat twi.  Case discussed with Dr. Burt Knack, who has also seen patient.  We will plan to take her for diagnostic cath now.  Pt and family are agreeable.  Murray Hodgkins, NP

## 2013-07-02 NOTE — Progress Notes (Signed)
  Amiodarone Drug - Drug Interaction Consult Note  Recommendations: Monitor for adverse effects for now.  Amiodarone is metabolized by the cytochrome P450 system and therefore has the potential to cause many drug interactions. Amiodarone has an average plasma half-life of 50 days (range 20 to 100 days).   There is potential for drug interactions to occur several weeks or months after stopping treatment and the onset of drug interactions may be slow after initiating amiodarone.   []  Statins: Increased risk of myopathy. Simvastatin- restrict dose to 20mg  daily. Other statins: counsel patients to report any muscle pain or weakness immediately.  []  Anticoagulants: Amiodarone can increase anticoagulant effect. Consider warfarin dose reduction. Patients should be monitored closely and the dose of anticoagulant altered accordingly, remembering that amiodarone levels take several weeks to stabilize.  []  Antiepileptics: Amiodarone can increase plasma concentration of phenytoin, the dose should be reduced. Note that small changes in phenytoin dose can result in large changes in levels. Monitor patient and counsel on signs of toxicity.  [x]  Beta blockers: increased risk of bradycardia, AV block and myocardial depression. Sotalol - avoid concomitant use.  []   Calcium channel blockers (diltiazem and verapamil): increased risk of bradycardia, AV block and myocardial depression.  []   Cyclosporine: Amiodarone increases levels of cyclosporine. Reduced dose of cyclosporine is recommended.  []  Digoxin dose should be halved when amiodarone is started.  []  Diuretics: increased risk of cardiotoxicity if hypokalemia occurs.  []  Oral hypoglycemic agents (glyburide, glipizide, glimepiride): increased risk of hypoglycemia. Patient's glucose levels should be monitored closely when initiating amiodarone therapy.   [x]  Drugs that prolong the QT interval:  Torsades de pointes risk may be increased with concurrent use  - avoid if possible.  Monitor QTc, also keep magnesium/potassium WNL if concurrent therapy can't be avoided. Marland Kitchen Antibiotics: e.g. fluoroquinolones, erythromycin. . Antiarrhythmics: e.g. quinidine, procainamide, disopyramide, sotalol. . Antipsychotics: e.g. phenothiazines, haloperidol.  . Lithium, tricyclic antidepressants, and methadone. Thank You,  [name]  07/02/2013 2:55 PM

## 2013-07-02 NOTE — Progress Notes (Signed)
Transported pt to cath lab on BIPAP

## 2013-07-02 NOTE — Progress Notes (Signed)
Agree with plan as above. Pt with continued chest pain, evolution of EKG changes now with diffuse T wave abnormality anteriorly. Plan urgent cath and possible PCI. Discussed with patient and her family. They understand and agree to proceed.

## 2013-07-02 NOTE — Progress Notes (Signed)
SUBJECTIVE:  Patient admitted earlier today.  Pneumonia and respiratory distress with elevated troponin.  She denies any pain at present.  She has had some intermittent sharp pains.     PHYSICAL EXAM Filed Vitals:   07/02/13 0445 07/02/13 0502 07/02/13 0600 07/02/13 0700  BP: 146/90 129/92 130/88 120/86  Pulse: 91 93 90 91  Temp:      TempSrc:      Resp: 20 17 23 25   Height:      Weight:      SpO2: 96% 96% 96% 96%   General:  Acutely ill, no distress Lungs:  Decreased breath sounds bilaterally  Heart:  RRR Abdomen:  Positive bowel sounds, no rebound no guarding Extremities:  No edema Neck :  No JVD  LABS: Lab Results  Component Value Date   TROPONINI 0.64* 07/02/2013   Results for orders placed during the hospital encounter of 07/02/13 (from the past 24 hour(s))  MRSA PCR SCREENING     Status: None   Collection Time    07/02/13 12:28 AM      Result Value Range   MRSA by PCR NEGATIVE  NEGATIVE  POCT I-STAT 3, BLOOD GAS (G3+)     Status: Abnormal   Collection Time    07/02/13  2:10 AM      Result Value Range   pH, Arterial 7.335 (*) 7.350 - 7.450   pCO2 arterial 44.4  35.0 - 45.0 mmHg   pO2, Arterial 54.0 (*) 80.0 - 100.0 mmHg   Bicarbonate 23.7  20.0 - 24.0 mEq/L   TCO2 25  0 - 100 mmol/L   O2 Saturation 85.0     Acid-base deficit 2.0  0.0 - 2.0 mmol/L   Patient temperature 98.6 F     Collection site RADIAL, ALLEN'S TEST ACCEPTABLE     Drawn by RT     Sample type ARTERIAL    TROPONIN I     Status: Abnormal   Collection Time    07/02/13  2:24 AM      Result Value Range   Troponin I 0.64 (*) <0.30 ng/mL  CBC WITH DIFFERENTIAL     Status: Abnormal   Collection Time    07/02/13  2:24 AM      Result Value Range   WBC 14.2 (*) 4.0 - 10.5 K/uL   RBC 4.06  3.87 - 5.11 MIL/uL   Hemoglobin 11.8 (*) 12.0 - 15.0 g/dL   HCT 35.4 (*) 36.0 - 46.0 %   MCV 87.2  78.0 - 100.0 fL   MCH 29.1  26.0 - 34.0 pg   MCHC 33.3  30.0 - 36.0 g/dL   RDW 15.2  11.5 - 15.5 %   Platelets 208  150 - 400 K/uL   Neutrophils Relative % 78 (*) 43 - 77 %   Neutro Abs 11.0 (*) 1.7 - 7.7 K/uL   Lymphocytes Relative 9 (*) 12 - 46 %   Lymphs Abs 1.3  0.7 - 4.0 K/uL   Monocytes Relative 13 (*) 3 - 12 %   Monocytes Absolute 1.8 (*) 0.1 - 1.0 K/uL   Eosinophils Relative 0  0 - 5 %   Eosinophils Absolute 0.0  0.0 - 0.7 K/uL   Basophils Relative 0  0 - 1 %   Basophils Absolute 0.0  0.0 - 0.1 K/uL  COMPREHENSIVE METABOLIC PANEL     Status: Abnormal   Collection Time    07/02/13  2:24 AM      Result  Value Range   Sodium 132 (*) 135 - 145 mEq/L   Potassium 4.3  3.5 - 5.1 mEq/L   Chloride 97  96 - 112 mEq/L   CO2 23  19 - 32 mEq/L   Glucose, Bld 133 (*) 70 - 99 mg/dL   BUN 14  6 - 23 mg/dL   Creatinine, Ser 1.24 (*) 0.50 - 1.10 mg/dL   Calcium 8.7  8.4 - 10.5 mg/dL   Total Protein 6.4  6.0 - 8.3 g/dL   Albumin 2.2 (*) 3.5 - 5.2 g/dL   AST 27  0 - 37 U/L   ALT 38 (*) 0 - 35 U/L   Alkaline Phosphatase 165 (*) 39 - 117 U/L   Total Bilirubin 0.5  0.3 - 1.2 mg/dL   GFR calc non Af Amer 42 (*) >90 mL/min   GFR calc Af Amer 49 (*) >90 mL/min  MAGNESIUM     Status: Abnormal   Collection Time    07/02/13  2:24 AM      Result Value Range   Magnesium 1.1 (*) 1.5 - 2.5 mg/dL  PRO B NATRIURETIC PEPTIDE     Status: Abnormal   Collection Time    07/02/13  2:24 AM      Result Value Range   Pro B Natriuretic peptide (BNP) 30181.0 (*) 0 - 125 pg/mL  PROTIME-INR     Status: None   Collection Time    07/02/13  2:24 AM      Result Value Range   Prothrombin Time 13.7  11.6 - 15.2 seconds   INR 1.07  0.00 - 1.49  APTT     Status: Abnormal   Collection Time    07/02/13  2:24 AM      Result Value Range   aPTT 120 (*) 24 - 37 seconds  HEPARIN LEVEL (UNFRACTIONATED)     Status: None   Collection Time    07/02/13  4:36 AM      Result Value Range   Heparin Unfractionated 0.49  0.30 - 0.70 IU/mL    Intake/Output Summary (Last 24 hours) at 07/02/13 0737 Last data filed at  07/02/13 0600  Gross per 24 hour  Intake  62.38 ml  Output   2100 ml  Net -2037.62 ml    EKG:    NSR, rate 80, Low voltage in the limb leads.  Old anteroseptal MI. QT prolonged.   ASSESSMENT AND PLAN:  NSTEMI:  Echo today.  Her EKG looks like and old anteroseptal MI and her pain is intermittent now and atypical (Sharp and left upper chest ).  She has no acute ST changes. Given the ongoing pneumonia I would not suggest urgent cardiac cath.  I will start with an echocardiogram.  Continue ASA and heparin.  I will give Lasix 40 this PM as well. She had a dose this AM.    PNEUMONIA:  Consult CCM.  Continue current antibiotics.    Minus Breeding 07/02/2013 7:37 AM

## 2013-07-02 NOTE — Interval H&P Note (Signed)
History and Physical Interval Note:  07/02/2013 12:27 PM  [name]  has presented today for surgery, with the diagnosis of urgent  The various methods of treatment have been discussed with the patient and family. After consideration of risks, benefits and other options for treatment, the patient has consented to  Procedure(s): LEFT HEART CATHETERIZATION WITH CORONARY ANGIOGRAM (N/A) as a surgical intervention .  The patient's history has been reviewed, patient examined, no change in status, stable for surgery.  I have reviewed the patient's chart and labs.  Questions were answered to the patient's satisfaction.    Patient examined by Dr Burt Knack who felt urgent cath needed. I agree However patient is very tenuous.  She may need to be intubated during case with significant distress on NRB in unit.  Still having chest pain. Troponin elevated with anteroseptal infarct on ECG and EF 35% by echo.  No acute ST elevation.     Jenkins Rouge

## 2013-07-02 NOTE — Progress Notes (Signed)
Pharmacy Note Re: Levaquin dose  Pt with orders for Levaquin 500 mg IV q 24 hrs.  However, due to CrCl ~ 32 ml/min, only needs dosing q 48 hrs.  Per paper records, it appears she received a dose of Levaquin 750 mg yesterday at [place].  Plan: Adjusted dose to 500 mg q 48 hrs. Please call with any questions.  Thanks!  Uvaldo Rising, BCPS  Clinical Pharmacist Pager [phone]  07/02/2013 3:02 PM

## 2013-07-02 NOTE — Progress Notes (Signed)
Pt currently on venti mask at 50% and sats are 94%.  Bipap left in room at this time for precaution. Rt wil continue to monitor.

## 2013-07-02 NOTE — Progress Notes (Signed)
Pt removed from bipap by md.  Pt began to desaturate and RT called to pull abg.  Abg values in range, but placed pt on bipap at above settings to increase o2 sat.

## 2013-07-02 NOTE — Progress Notes (Signed)
ANTICOAGULATION CONSULT NOTE - Follow Up Consult  Pharmacy Consult for Heparin Indication: chest pain/ACS  Allergies  Allergen Reactions  . Codeine     Patient Measurements: Height: 5\' 2"  (157.5 cm) Weight: 123 lb 14.4 oz (56.2 kg) IBW/kg (Calculated) : 50.1  Vital Signs: Temp: 98 F (36.7 C) (11/03 1200) Temp src: Oral (11/03 0730) BP: 107/63 mmHg (11/03 1500) Pulse Rate: 82 (11/03 1500)  Labs:  Recent Labs  07/02/13 0224 07/02/13 0436 07/02/13 0737 07/02/13 1205  HGB 11.8*  --   --   --   HCT 35.4*  --   --   --   PLT 208  --   --   --   APTT 120*  --   --   --   LABPROT 13.7  --   --   --   INR 1.07  --   --   --   HEPARINUNFRC  --  0.49  --  0.37  CREATININE 1.24*  --   --   --   TROPONINI 0.64*  --  0.72*  --     Estimated Creatinine Clearance: 32 ml/min (by C-G formula based on Cr of 1.24).   Medications:  Scheduled:  . antiseptic oral rinse  15 mL Mouth Rinse q12n4p  . aspirin EC  325 mg Oral Daily  . atorvastatin  40 mg Oral q1800  . chlorhexidine  15 mL Mouth Rinse BID  . insulin aspart  0-15 Units Subcutaneous TID WC  . [START ON 07/03/2013] levofloxacin (LEVAQUIN) IV  500 mg Intravenous Q48H  . metoprolol tartrate  6.25 mg Oral QID  . piperacillin-tazobactam (ZOSYN)  IV  3.375 g Intravenous Q8H  . sodium chloride  3 mL Intravenous Q12H  . sodium chloride  3 mL Intravenous Q12H    Assessment: 72 yo F presented w/ NSTEMI on on 11/1 from [place], in the setting of pna and sepsis. Pt s/p emergent cath, held heparin during procedure. Plan to restart heparin 8hr post sheath removal. CBC stable, and renal function back to baseline. Last HL therapeutic, drawn prior to holding for procedure.   Goal of Therapy:  Heparin level 0.3-0.7 units/ml Monitor platelets by anticoagulation protocol: Yes   Plan:  1. Restart Heparin at 960 units/hr @ 2100 2. F/U with daily AM CBC and HL 3. Monitor for s/sx for bleed  Angelica Chessman 07/02/2013,3:15 PM

## 2013-07-02 NOTE — Progress Notes (Signed)
Patient was transferred to the cath lab with RN and cath lab staff.  Report was given to cath lab staff.

## 2013-07-02 NOTE — Progress Notes (Signed)
  Echocardiogram 2D Echocardiogram has been performed.  Mauricio Po 07/02/2013, 9:25 AM

## 2013-07-02 NOTE — H&P (Signed)
Admission History and Physical   Patient ID: [name], MRN: [id number], DOB: 1941/08/04 72 y.o. Date of Encounter: 07/02/2013, 1:07 AM  Primary Physician: [facility] [place] Primary Cardiologist: None  Chief Complaint:  NSTEMI  History of Present Illness: [name] is a 72 y.o. female with a history of non-insulin dependant DMII, HTN, Hyperlipidemia, who presented to [facility] hospital 10/30 with a multiofocal pneumonia and sepsis.  She had been in her usual state of health, and the day prior had walked 2 miles with her daughter.  She was initially admitted to the ICU and treated with IV abx and fluids.  Also with acute on chronic kidney injury, anemia on presentation.    She was transferred out of the ICU on 11/1, however around 5p that evening she developed some SOB, and chest/abd pain.  Today her SOB progressed to the point that she required a 100% NRB and transfer back to ICU.  BP remained stable but her chest pain persisted.  Troponin was checked and was elevated along with elevated BNP, JVP on exam, and likely pulmonary edema, all consistent with CHF.  She is transferred to the [facility] CCU for further care on BiPAP and nitro gtt.  On my history she is lethargic but able to answer questions.  She complains of ~5/10 chest pressure but no other symptoms.  She is on a 50% NRB O2 sat 92%.     Past Medical History  Diagnosis Date  . GERD (gastroesophageal reflux disease)   . Hypercholesterolemia   . Diabetes mellitus   . Hypertension   . Bursitis   . FHx: migraine headaches   . Renal lithiasis      Past Surgical History  Procedure Laterality Date  . Partial hysterectomy    . Hip surgery      right  . Cholecystectomy    . Esophagogastroduodenoscopy  06/12/2007    [id number] esophagus/patulous EG junction, moderate sized  hiatal hernia, otherwise normal stomach, D1, D2  . Colonoscopy  06/15/2005    RMR: Minimal internal hemorrhoids. Otherwise normal rectum/Normal colon   . Colonoscopy    12/14/2001    RMR: Internal hemorrhage, otherwise, normal rectum/ Polyps at 30 and 35 cm resected; the remainder of colonic mucosa appeared normal      No current facility-administered medications for this encounter.      Allergies: Allergies not on file   Social History:  Lives alone but family near by.  50 pack year smoking history but not currently.   Family History:  The patient's family history is not on file.   ROS:  ROS incomplete as patient is tired and only able to answer minimal questions.  She denies pain or discomfort other than chest pressure, left sided under her breast.  Vital Signs: Pulse 84, resp. rate 27, SpO2 94.00%. BP 110/70  PHYSICAL EXAM: General:  Moderate respiratory distress.  Somnolent.  Not chronically ill apparing. HEENT: normal Lymph: no adenopathy Neck: JVP elevated to ~ 10cm with +HJR Endocrine:  No thryomegaly Vascular: No carotid bruits; FA pulses 2+ bilaterally without bruits Cardiac:  Quiet S1, S2.  No murmurs.  Extremities luke-warm Lungs:  Coarse breath sounds bilaterally anteriorly. Abd: soft, nontender, no hepatomegaly Ext: no edema Musculoskeletal:  No deformities, BUE and BLE strength normal and equal Skin: warm and dry Neuro:  CNs 2-12 intact, no focal abnormalities noted Psych:  Normal affect   EKG:   Sinus rhythm.  Anterior Q-waves  Bedside Chest U/S:  No pericardial effusion.  RV function normal.  LVEF ~ 35% with anteroseptal akinesis.  IVC normal size, collapses < 50%.  Trace MR.   OSH Labs: Cr. 2.47 -> 1.22 Na 133 K 4.4 Hgb 10.3 (post transfusion) PLT 129 BNP 1561    Radiology/Studies:  No results found.   ASSESSMENT AND PLAN:   1. NSTEMI - In the setting of pneumonia and sepsis.  - Chest pain free on re-evaluation, stop nitroglycerin gtt given soft blood pressures. - Bedside chest u/s performed by myself showed: No pericardial effusion.  RV function normal.  LVEF ~ 35% with anteroseptal  akinesis.  IVC normal size, collapses < 50%.  Trace MR.   - concern for proximal LAD disease.  Full echo in the morning - Continue heparin gtt, aspirin. - likely cannot start ACE/BB in acute setting.   Did get dose of BB at OSH. - NPO for consideration of cath later today. - Please keep O2 sat > 92%.  May need cpap restarted.  2.  Hypoxia -  Likely combination of pneumonia with pulmonary edema.  -  Repeat CXR. -  Profound hypoxia raises possibility of ARDS.  3.  Pneumonia/Sepsis - She was fluid resuscitated at the OSH.  Had SBP as low as 60s. - Likely needs some diuresis now.  Will not be too aggressive with diuresis given likely ongoing infection. - Continue broad spectrum Abx. F/U OSH Blood cultures.   SignedStephani Police, MD 07/02/2013, 1:07 AM  Addendum 6:04 AM  -BP up to 130/90 off of nitro gtt.  Still CP free.  On 60% BiPAP.  Will start metoprolol 6.25 BID.

## 2013-07-02 NOTE — Progress Notes (Signed)
Pt transported back from cath lab to 2H05 via bipap.  Pt remains on bipap once back in room.  RT covering unit aware.

## 2013-07-02 NOTE — CV Procedure (Signed)
    Cardiac Catheterization Procedure Note  Name: [name] MRN: [id number] DOB: Dec 31, 1940  Procedure: Left Heart Cath, Selective Coronary Angiography, LV angiography  Indication:  SEMI  CHF positive enzymes   Procedural Details: The right wrist was prepped, draped, and anesthetized with 1% lidocaine. Using the modified Seldinger technique, a 5 French sheath was introduced into the right radial artery. 3 mg of verapamil was administered through the sheath, weight-based unfractionated heparin was administered intravenously. Standard Judkins catheters were used for selective coronary angiography and left ventriculography. Catheter exchanges were performed over an exchange length guidewire. There were no immediate procedural complications. A TR band was used for radial hemostasis at the completion of the procedure.  The patient was transferred to the post catheterization recovery area for further monitoring.  Procedural Findings: Hemodynamics: AO  110 78 LV  100 16 33   Both pressures when patient in rapid atrial fibrilation  Coronary angiography: Coronary dominance: right  Left mainstem:  Normal  Left anterior descending (LAD):  Normal  D1: normal  D2: normal  Left circumflex (LCx):  Normal  OM1: normal  Right coronary artery (RCA): large dominant vessel normal  Left ventriculography: not done due to previous elevation in Cr and CHF  Final Conclusions:  Likey Takatsubo disease  Patient went into rapid afib during the case.  Rx with amiodarone   Recommendations:  Continue supportive care for pneumonia , sepsis CHF and PAF.  Given iv lasix in lab 10 mg iv and iv amiodarone Nitro decreased to 5ug/min  [name] 07/02/2013, 1:13 PM

## 2013-07-03 ENCOUNTER — Inpatient Hospital Stay (HOSPITAL_COMMUNITY): Payer: Medicare Other

## 2013-07-03 ENCOUNTER — Encounter (HOSPITAL_COMMUNITY): Payer: Self-pay | Admitting: Neurology

## 2013-07-03 DIAGNOSIS — K219 Gastro-esophageal reflux disease without esophagitis: Secondary | ICD-10-CM

## 2013-07-03 DIAGNOSIS — J189 Pneumonia, unspecified organism: Secondary | ICD-10-CM

## 2013-07-03 DIAGNOSIS — I214 Non-ST elevation (NSTEMI) myocardial infarction: Secondary | ICD-10-CM

## 2013-07-03 LAB — CBC
HCT: 36.3 % (ref 36.0–46.0)
Hemoglobin: 12.5 g/dL (ref 12.0–15.0)
MCHC: 34.4 g/dL (ref 30.0–36.0)
MCV: 85.4 fL (ref 78.0–100.0)
RBC: 4.25 MIL/uL (ref 3.87–5.11)

## 2013-07-03 LAB — GLUCOSE, CAPILLARY
Glucose-Capillary: 166 mg/dL — ABNORMAL HIGH (ref 70–99)
Glucose-Capillary: 140 mg/dL — ABNORMAL HIGH (ref 70–99)

## 2013-07-03 LAB — BASIC METABOLIC PANEL
BUN: 20 mg/dL (ref 6–23)
Creatinine, Ser: 1.33 mg/dL — ABNORMAL HIGH (ref 0.50–1.10)
GFR calc Af Amer: 45 mL/min — ABNORMAL LOW (ref >90)
GFR calc non Af Amer: 39 mL/min — ABNORMAL LOW (ref >90)
Glucose, Bld: 147 mg/dL — ABNORMAL HIGH (ref 70–99)
Potassium: 3.2 mEq/L — ABNORMAL LOW (ref 3.5–5.1)

## 2013-07-03 MED ORDER — PANTOPRAZOLE SODIUM 40 MG PO TBEC
40.0000 mg | DELAYED_RELEASE_TABLET | Freq: Every day | ORAL | Status: DC
  Administered 2013-07-03 – 2013-07-06 (×4): 40 mg via ORAL
  Filled 2013-07-03 (×3): qty 1

## 2013-07-03 MED ORDER — FUROSEMIDE 10 MG/ML IJ SOLN
20.0000 mg | Freq: Once | INTRAMUSCULAR | Status: AC
  Administered 2013-07-03: 20 mg via INTRAVENOUS

## 2013-07-03 MED ORDER — POTASSIUM CHLORIDE CRYS ER 20 MEQ PO TBCR
30.0000 meq | EXTENDED_RELEASE_TABLET | Freq: Two times a day (BID) | ORAL | Status: DC
  Administered 2013-07-03 (×2): 30 meq via ORAL
  Filled 2013-07-03 (×6): qty 1

## 2013-07-03 MED ORDER — ALUM & MAG HYDROXIDE-SIMETH 200-200-20 MG/5ML PO SUSP
30.0000 mL | ORAL | Status: DC | PRN
  Administered 2013-07-03 – 2013-07-04 (×3): 30 mL via ORAL
  Filled 2013-07-03 (×3): qty 30

## 2013-07-03 MED ORDER — FUROSEMIDE 10 MG/ML IJ SOLN
INTRAMUSCULAR | Status: AC
  Filled 2013-07-03: qty 4

## 2013-07-03 MED ORDER — FLUOXETINE HCL 20 MG PO CAPS
40.0000 mg | ORAL_CAPSULE | Freq: Every day | ORAL | Status: DC
  Administered 2013-07-03 – 2013-07-06 (×4): 40 mg via ORAL
  Filled 2013-07-03 (×5): qty 2

## 2013-07-03 MED ORDER — HEPARIN (PORCINE) IN NACL 100-0.45 UNIT/ML-% IJ SOLN
1160.0000 [IU]/h | INTRAMUSCULAR | Status: DC
  Administered 2013-07-03: 1050 [IU]/h via INTRAVENOUS
  Administered 2013-07-03 – 2013-07-04 (×2): 1150 [IU]/h via INTRAVENOUS
  Filled 2013-07-03 (×2): qty 250

## 2013-07-03 NOTE — Progress Notes (Signed)
ANTICOAGULATION CONSULT NOTE - Follow Up Consult  Pharmacy Consult for Heparin  Indication: chest pain/ACS, afib during cath  Allergies  Allergen Reactions  . Codeine     Patient Measurements: Height: 5\' 2"  (157.5 cm) Weight: 123 lb 14.4 oz (56.2 kg) IBW/kg (Calculated) : 50.1  Vital Signs: Temp: 98 F (36.7 C) (11/04 0400) Temp src: Oral (11/04 0400) BP: 106/59 mmHg (11/04 0500) Pulse Rate: 75 (11/04 0500)  Labs:  Recent Labs  07/02/13 0224 07/02/13 0436 07/02/13 0737 07/02/13 1205 07/03/13 0510  HGB 11.8*  --   --   --  12.5  HCT 35.4*  --   --   --  36.3  PLT 208  --   --   --  207  APTT 120*  --   --   --   --   LABPROT 13.7  --   --   --   --   INR 1.07  --   --   --   --   HEPARINUNFRC  --  0.49  --  0.37 0.21*  CREATININE 1.24*  --   --   --   --   TROPONINI 0.64*  --  0.72*  --   --     Estimated Creatinine Clearance: 32 ml/min (by C-G formula based on Cr of 1.24).   Medications:  -Heparin 950 units/hr  Assessment: 72 y/o F s/p cath on 11/3, heparin re-started 8 hour post sheath removal at 950 units/hr. Pt also went into afib during cath and was started on amio drip. HL 0.21 after restarting.   Goal of Therapy:  Heparin level 0.3-0.7 units/ml Monitor platelets by anticoagulation protocol: Yes   Plan:  -Increase heparin to 1050 units/hr -8 hour HL at 1500 -Daily CBC/HL -F/U cardiology plans  Thank you for allowing me to take part in this patient's care,  Narda Bonds, PharmD Clinical Pharmacist Phone: [phone] Pager: [phone] 07/03/2013 6:42 AM

## 2013-07-03 NOTE — Progress Notes (Signed)
SUBJECTIVE:   She complains of chest pressure after eating her breakfast, denies shortness of breath or abdominal pain. She denies recent life stressors but has been sick for days due to pneumonia.   Denies orthopnea, shortness of breath, chest pain, leg edema, or PND prior to this admission.    PHYSICAL EXAM Filed Vitals:   07/03/13 0200 07/03/13 0300 07/03/13 0400 07/03/13 0500  BP: 95/47 96/47 99/53  106/59  Pulse: 71 74 72 75  Temp:   98 F (36.7 C)   TempSrc:   Oral   Resp: 18 21 17 9   Height:      Weight:      SpO2: 91% 93% 89% 91%     General:  Acutely ill, no distress, sitting up in bed Lungs:  Decreased breath sounds bilaterally  Heart:  RRR Abdomen:  Positive bowel sounds, no rebound no guarding Extremities:  No edema Neck :  No JVD  LABS: Lab Results  Component Value Date   TROPONINI 0.72* 07/02/2013   Results for orders placed during the hospital encounter of 07/02/13 (from the past 24 hour(s))  HEPARIN LEVEL (UNFRACTIONATED)     Status: None   Collection Time    07/02/13 12:05 PM      Result Value Range   Heparin Unfractionated 0.37  0.30 - 0.70 IU/mL  GLUCOSE, CAPILLARY     Status: Abnormal   Collection Time    07/02/13  4:45 PM      Result Value Range   Glucose-Capillary 156 (*) 70 - 99 mg/dL  GLUCOSE, CAPILLARY     Status: Abnormal   Collection Time    07/02/13  9:38 PM      Result Value Range   Glucose-Capillary 175 (*) 70 - 99 mg/dL  HEPARIN LEVEL (UNFRACTIONATED)     Status: Abnormal   Collection Time    07/03/13  5:10 AM      Result Value Range   Heparin Unfractionated 0.21 (*) 0.30 - 0.70 IU/mL  CBC     Status: Abnormal   Collection Time    07/03/13  5:10 AM      Result Value Range   WBC 12.3 (*) 4.0 - 10.5 K/uL   RBC 4.25  3.87 - 5.11 MIL/uL   Hemoglobin 12.5  12.0 - 15.0 g/dL   HCT 36.3  36.0 - 46.0 %   MCV 85.4  78.0 - 100.0 fL   MCH 29.4  26.0 - 34.0 pg   MCHC 34.4  30.0 - 36.0 g/dL   RDW 15.4  11.5 - 15.5 %   Platelets 207   150 - 400 K/uL  BASIC METABOLIC PANEL     Status: Abnormal   Collection Time    07/03/13  5:10 AM      Result Value Range   Sodium 130 (*) 135 - 145 mEq/L   Potassium 3.2 (*) 3.5 - 5.1 mEq/L   Chloride 88 (*) 96 - 112 mEq/L   CO2 29  19 - 32 mEq/L   Glucose, Bld 147 (*) 70 - 99 mg/dL   BUN 20  6 - 23 mg/dL   Creatinine, Ser 1.33 (*) 0.50 - 1.10 mg/dL   Calcium 8.6  8.4 - 10.5 mg/dL   GFR calc non Af Amer 39 (*) >90 mL/min   GFR calc Af Amer 45 (*) >90 mL/min    Intake/Output Summary (Last 24 hours) at 07/03/13 0807 Last data filed at 07/03/13 0600  Gross per 24 hour  Intake 1950.25  ml  Output   4425 ml  Net -2474.75 ml    DG port Chest X-ray 07/03/13: IMPRESSION:  Improvement in vascular congestion although a persistent right  basilar infiltrate is seen.    EKG:    NSR, rate 80, Low voltage in the limb leads.  Old anteroseptal MI. QT prolonged.   ASSESSMENT AND PLAN:  STEMI with acute HF, post cath A.fib:  Post cardiac cath on 11/3 after increased SOB, EKG changes, and elevated troponin. Cath findings negative for ischemia, echo consistent with Takatsubo. She has intermittent A. Fib.  Breathing status is gradually improving with increased diuresis.  -Continue ASA and heparin. -Will give Lasix 20mg  IV now and this PM since stable Cr and BP.  -Continue BB for Takatsubo, will add ACEi after discharge given CKD and recent AKI at outside hospital.  -Continue amiodarone for intermittent A.fib.  -Start Protonix for chest discomfort (on Prilosec at home).  -repeat EKG   PNEUMONIA:  Consult CCM.  Continue current antibiotics.    Adele Barthel D 07/03/2013 8:07 AM   The patient was seen, examined and discussed with Adele Barthel and I agree with the above.   In short, previously healthy patient with h/o hypertension, who was admitted with sepsis secondary to pneumonia. The patient was found to have troponin elevation with EKG changes and underwent a cath that showed  normal coronaries. There is a severe LV dysfunction (LV EF 20%)with akinesis of the mid anteroseptal, apical septal, mid and apical anterior walls and hypokinesis of the apical inferior and lateral walls. This is suggestive of a Tako-tsubo cardiomyopathy. We will uptitrate BB as tolerated by BP. We will hold ACEI for now, as she had acute on chronic renal failure on admission and just received iodine contrast. We will consider prior to the discharge. The patient is symptomatically better but still fluid overloaded, she diuresed 4.4 L in the last 24 hours. We will give minimal dose of Lasix today to prevent kidney failure post cath.   Ena Dawley, H 07/03/2013   The pa

## 2013-07-03 NOTE — Progress Notes (Signed)
PULMONARY  / CRITICAL CARE MEDICINE  Name: [name] MRN: [id number] DOB: 12-22-1940    ADMISSION DATE:  07/02/2013 CONSULTATION DATE:  07/02/2013  REFERRING MD :  Bensimhon PRIMARY SERVICE: Cardiology  BRIEF PATIENT DESCRIPTION: 72 y.o. F with DM2, HTN, HLD, presented to [facility] 10/30 and found to have RLL PNA and sepsis. Developed chest pain, SOB and had troponin bump with EKG changes & diffuse infiltrates c/w edema. Transferred to [facility] for PCI. Cath was negative for any ischemia, findings suggestive of likely Takatsubo disease. Developed AF-RVR & worse hypoxia post cath - Cardiology consulted PCCM for further management of her PNA.   SIGNIFICANT EVENTS / STUDIES:  10/30 - Admitted to [facility] for PNA, in ICU.  11/1 - Transferred out of ICU but developed SOB and chest pain.   LINES / TUBES:  Foley 11/2>>>  CULTURES:  Blood 10/30 [name]) >>  ANTIBIOTICS:  Levaquin 11/3 >>>  Zosyn 11/3 >>>    SUBJECTIVE:  NAEON.  Pt reports she feels better this AM compared to when she first came in.  SOB is improved somewhat, not fully.  Now on 4L O2 via [place], doing well.  Mild cough, non-productive.  Denies fevers, chills, sweats, URI symptoms.   Has some mild chest tightness, mid sternum.   VITAL SIGNS: Temp:  [97.8 F (36.6 C)-98.1 F (36.7 C)] 98.1 F (36.7 C) (11/04 0800) Pulse Rate:  [35-96] 83 (11/04 0900) Resp:  [9-28] 17 (11/04 0900) BP: (82-120)/(45-82) 95/50 mmHg (11/04 0900) SpO2:  [88 %-98 %] 96 % (11/04 0900) FiO2 (%):  [4 %-50 %] 4 % (11/04 0800) HEMODYNAMICS:   VENTILATOR SETTINGS: Vent Mode:  [-]  FiO2 (%):  [4 %-50 %] 4 % INTAKE / OUTPUT: Intake/Output     11/03 0701 - 11/04 0700 11/04 0701 - 11/05 0700   P.O. 600 120   I.V. (mL/kg) 1271.5 (22.6) 74.4 (1.3)   IV Piggyback 150 25   Total Intake(mL/kg) 2021.5 (36) 219.4 (3.9)   Urine (mL/kg/hr) 4425 (3.3)    Total Output 4425     Net -2403.6 +219.4        Stool Occurrence 1 x       PHYSICAL EXAMINATION: General:  Pleasant female, lying in bed, in NAD. Neuro:  A&O x 3.  Mood and affect appropriate. HEENT:  [place]/AT.  PERRL.  MMM Cardiovascular:  RRR.  No M/R/G. Lungs:  Resp's even and unlabored.  Decreased breath sounds in right base, mild crackles in left.  No W/R/R. Abdomen:  BS x 4.  Soft, NT/ND. Musculoskeletal:  No gross deformities.  No edema. Skin:  Warm and dry.  LABS:  CBC Recent Labs     07/02/13  0224  07/03/13  0510  WBC  14.2*  12.3*  HGB  11.8*  12.5  HCT  35.4*  36.3  PLT  208  207   Coag's Recent Labs     07/02/13  0224  APTT  120*  INR  1.07   BMET Recent Labs     07/02/13  0224  07/03/13  0510  NA  132*  130*  K  4.3  3.2*  CL  97  88*  CO2  23  29  BUN  14  20  CREATININE  1.24*  1.33*  GLUCOSE  133*  147*   Electrolytes Recent Labs     07/02/13  0224  07/03/13  0510  CALCIUM  8.7  8.6  MG  1.1*   --  ABG Recent Labs     07/02/13  0210  PHART  7.335*  PCO2ART  44.4  PO2ART  54.0*   Liver Enzymes Recent Labs     07/02/13  0224  AST  27  ALT  38*  ALKPHOS  165*  BILITOT  0.5  ALBUMIN  2.2*   Cardiac Enzymes Recent Labs     07/02/13  0224  07/02/13  0737  TROPONINI  0.64*  0.72*  PROBNP  30181.0*   --    Glucose Recent Labs     07/02/13  0750  07/02/13  1645  07/02/13  2138  07/03/13  0808  GLUCAP  116*  156*  175*  166*    Imaging Dg Chest Port 1 View  07/03/2013   CLINICAL DATA:  Shortness of breath  EXAM: PORTABLE CHEST - 1 VIEW  COMPARISON:  07/02/2013  FINDINGS: The cardiac shadow is stable. The diffuse vascular congestion and central edema has resolved in the interval. Persistent right basilar infiltrate is noted.  IMPRESSION: Improvement in vascular congestion although a persistent right basilar infiltrate is seen.   Electronically Signed   By: Inez Catalina M.D.   On: 07/03/2013 07:08   Dg Chest Port 1 View  07/02/2013   CLINICAL DATA:  Hypoxia.  EXAM: PORTABLE CHEST - 1 VIEW   COMPARISON:  CHEST x-ray 07/01/2013.  FINDINGS: Diffuse interstitial prominence and patchy predominantly perihilar airspace disease, concerning for a background of moderate pulmonary edema. There is more focal airspace consolidation in the periphery of the right lung at the base, which may suggest superimposed infection or sequela of aspiration. Probable small a moderate right-sided pleural effusion. Heart size appears borderline enlarged. The patient is rotated to the right on today's exam, resulting in distortion of the mediastinal contours and reduced diagnostic sensitivity and specificity for mediastinal pathology.  IMPRESSION: 1. The appearance of the chest is similar to the prior study. There appears to be a background of moderate pulmonary edema, but there is a more focal airspace consolidation at the right base concerning for superimposed infection or sequela of aspiration, likely with moderate right-sided pleural effusion.   Electronically Signed   By: Vinnie Langton M.D.   On: 07/02/2013 02:23     ASSESSMENT / PLAN:  PULMONARY A: Community Acquired Pneumonia - CXR this AM and clinical picture suggests PNA is improving. Hypoxia - in the setting of CAP. Pleural Effusion - Concern for parapneumonic effusion. Mild Pulmonary Edema - improving. P:   - D/c Zosyn, continue levaquin - Cont Lasix, re-assess CXR in AM. - Supplemental O2 to maintain SpO2 > 93% - f/u CXR in AM, monitor rt  effusion.  If worsens, could possibly need thoracentesis. - IS/pulmonary hygiene. - OOB as tolerated with assist.  INFECTIOUS A:  Leukocytosis - presumed secondary to CAP given hypoxia and CXR findings.  Improving Pneumonia P:   - Cont Abx as above. - Monitor WBC's/fever curve.  CARDIOVASCULAR A:  Chest Pain - s/p cath lab, found to have no ischemia but likely Takatsubo Disease. Takatsubo Disease - given clear cath Atrial Fibrillation - s/p cardiac cath, on Amiodarone now. Troponin Leak - Presumed  secondary to Takatsubo disease. P:  - follow Troponins - manage per Cardiology.    RENAL A:  AKI - Unclear etiology.  Cr down from admission at [facility].   Hypokalemia - presumed secondary to lasix P:   - K ordered - Monitor BMP.    HEMATOLOGIC A:  Anemia - improved. P:   -  Stable since admission at [facility] - f/u CBC in AM.  ENDOCRINE A:  DM II - Chronic. P:   - SSI  Kara Mead MD. Sh[place] Flood. [facility] Pager [phone] If no response call [phone]

## 2013-07-03 NOTE — Progress Notes (Signed)
ANTICOAGULATION CONSULT NOTE - Follow Up Consult  Pharmacy Consult for Heparin  Indication: chest pain/ACS, afib during cath  Allergies  Allergen Reactions  . Codeine     Patient Measurements: Height: 5\' 2"  (157.5 cm) Weight: 123 lb 14.4 oz (56.2 kg) IBW/kg (Calculated) : 50.1  Vital Signs: Temp: 97.5 F (36.4 C) (11/04 1200) Temp src: Oral (11/04 1200) BP: 98/56 mmHg (11/04 1300) Pulse Rate: 69 (11/04 1300)  Labs:  Recent Labs  07/02/13 0224  07/02/13 0737 07/02/13 1205 07/03/13 0510 07/03/13 1455  HGB 11.8*  --   --   --  12.5  --   HCT 35.4*  --   --   --  36.3  --   PLT 208  --   --   --  207  --   APTT 120*  --   --   --   --   --   LABPROT 13.7  --   --   --   --   --   INR 1.07  --   --   --   --   --   HEPARINUNFRC  --   < >  --  0.37 0.21* 0.27*  CREATININE 1.24*  --   --   --  1.33*  --   TROPONINI 0.64*  --  0.72*  --   --   --   < > = values in this interval not displayed.  Estimated Creatinine Clearance: 29.8 ml/min (by C-G formula based on Cr of 1.33).   Medications:  -Heparin 950 units/hr  Assessment: 72 y/o F s/p cath on 11/3, heparin re-started 8 hour post sheath removal at 950 units/hr. Pt also went into afib during cath and was started on amio drip. HL remains subtherapeutic 0.27. Plt and hgb wnl.   Goal of Therapy:  Heparin level 0.3-0.7 units/ml Monitor platelets by anticoagulation protocol: Yes   Plan:  -Increase heparin to 1160 u/hr -8 hour HL at 0000 -Daily CBC/HL -F/U cardiology plans  Thank you for allowing me to take part in this patient's care,  Breezy Hertenstein M. Posey Pronto, PharmD Clinical Pharmacist- Resident Pager: [phone] Pharmacy: [phone] 07/03/2013 3:55 PM    07/03/2013 3:54 PM

## 2013-07-04 ENCOUNTER — Inpatient Hospital Stay (HOSPITAL_COMMUNITY): Payer: Medicare Other

## 2013-07-04 DIAGNOSIS — I4891 Unspecified atrial fibrillation: Secondary | ICD-10-CM

## 2013-07-04 LAB — BASIC METABOLIC PANEL
CO2: 32 mEq/L (ref 19–32)
Calcium: 8.5 mg/dL (ref 8.4–10.5)
Chloride: 94 mEq/L — ABNORMAL LOW (ref 96–112)
Creatinine, Ser: 1.47 mg/dL — ABNORMAL HIGH (ref 0.50–1.10)
Glucose, Bld: 163 mg/dL — ABNORMAL HIGH (ref 70–99)

## 2013-07-04 LAB — GLUCOSE, CAPILLARY
Glucose-Capillary: 160 mg/dL — ABNORMAL HIGH (ref 70–99)
Glucose-Capillary: 154 mg/dL — ABNORMAL HIGH (ref 70–99)
Glucose-Capillary: 168 mg/dL — ABNORMAL HIGH (ref 70–99)
Glucose-Capillary: 176 mg/dL — ABNORMAL HIGH (ref 70–99)

## 2013-07-04 LAB — CBC
HCT: 34.3 % — ABNORMAL LOW (ref 36.0–46.0)
Hemoglobin: 11.3 g/dL — ABNORMAL LOW (ref 12.0–15.0)
MCH: 28.6 pg (ref 26.0–34.0)
MCHC: 32.9 g/dL (ref 30.0–36.0)
MCV: 86.8 fL (ref 78.0–100.0)
RBC: 3.95 MIL/uL (ref 3.87–5.11)

## 2013-07-04 LAB — HEPARIN LEVEL (UNFRACTIONATED): Heparin Unfractionated: 0.47 IU/mL (ref 0.30–0.70)

## 2013-07-04 MED ORDER — RIVAROXABAN 15 MG PO TABS
15.0000 mg | ORAL_TABLET | Freq: Every day | ORAL | Status: DC
  Filled 2013-07-04: qty 1

## 2013-07-04 MED ORDER — AMIODARONE HCL 200 MG PO TABS
200.0000 mg | ORAL_TABLET | Freq: Two times a day (BID) | ORAL | Status: DC
  Administered 2013-07-04 – 2013-07-06 (×5): 200 mg via ORAL
  Filled 2013-07-04 (×6): qty 1

## 2013-07-04 MED ORDER — LOPERAMIDE HCL 2 MG PO CAPS: 4.0000 mg | ORAL_CAPSULE | ORAL | Status: DC | PRN

## 2013-07-04 MED ORDER — AMOXICILLIN-POT CLAVULANATE 875-125 MG PO TABS
1.0000 | ORAL_TABLET | Freq: Two times a day (BID) | ORAL | Status: DC
  Administered 2013-07-04 – 2013-07-06 (×5): 1 via ORAL
  Filled 2013-07-04 (×8): qty 1

## 2013-07-04 MED ORDER — CARVEDILOL 3.125 MG PO TABS
3.1250 mg | ORAL_TABLET | Freq: Two times a day (BID) | ORAL | Status: DC
  Administered 2013-07-04 – 2013-07-06 (×5): 3.125 mg via ORAL
  Filled 2013-07-04 (×7): qty 1

## 2013-07-04 MED ORDER — CARVEDILOL 3.125 MG PO TABS
3.1250 mg | ORAL_TABLET | Freq: Two times a day (BID) | ORAL | Status: DC
  Filled 2013-07-04 (×2): qty 1

## 2013-07-04 MED ORDER — ENOXAPARIN SODIUM 60 MG/0.6ML ~~LOC~~ SOLN
55.0000 mg | SUBCUTANEOUS | Status: DC
  Administered 2013-07-04 – 2013-07-05 (×2): 55 mg via SUBCUTANEOUS
  Filled 2013-07-04 (×3): qty 0.6

## 2013-07-04 NOTE — Evaluation (Addendum)
Physical Therapy Evaluation Patient Details Name: [name] MRN: [id number] DOB: Jan 11, 1941 Today's Date: 07/04/2013 Time: [id number] PT Time Calculation (min): 26 min  PT Assessment / Plan / Recommendation History of Present Illness  72 y.o. female admitted to [facility] on 07/02/13 with history of non-insulin dependant DMII, HTN, Hyperlipidemia, who presented to [facility] hospital 10/30 with a multiofocal pneumonia and sepsis. She had been in her usual state of health, and the day prior had walked 2 miles with her daughter. She was initially admitted to the ICU and treated with IV abx and fluids. Also with acute on chronic kidney injury, anemia on presentation.  She was transferred out of the ICU on 11/1, however around 5p that evening she developed some SOB, and chest/abd pain.  She required a 100% NRB and transfer back to ICU. BP remained stable but her chest pain persisted. Troponin was checked and was elevated along with elevated BNP, JVP on exam, and likely pulmonary edema, all consistent with CHF. She eventually needed BiPap went through a heart cath on 07/02/13 (negative for ischemia) and is now trying to wean from O2 [place] to RA.    Clinical Impression  Pt is mobilizing well, but her O2 sats are dropping into the upper 80s on RA with gait.  The further we went walking the better the sats looked (up to the low 90s).  Pt and daughter educated about activity modification, and trying to progress slowly back to activities she was doing before. 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  I encouraged her to cut back- this may be too much and too stressful on her system.  I do not believe she will need home O2, but PT will follow acutely to monitor her activity tolerance and continue education about activity modification and progression.    PT Assessment  Patient needs continued PT services    Follow Up  Recommendations  No PT follow up;Supervision - Intermittent;Other (comment) (maybe HHRN for CHF education and some initial vitals monitor)    Does the patient have the potential to tolerate intense rehabilitation     NA  Barriers to Discharge   None      Equipment Recommendations  None recommended by PT    Recommendations for Other Services   None  Frequency Min 3X/week    Precautions / Restrictions Precautions Precautions: Other (comment) Precaution Comments: monitor O2 sats with gait.    Pertinent Vitals/Pain O2 sats on RA at rest 88%, with gait 87%, 90%, 91% (got better the more we walked) HR stable, DOE 2/4 with gait.       Mobility  Bed Mobility Bed Mobility: Supine to Sit;Sitting - Scoot to Edge of Bed;Sit to Supine Supine to Sit: 6: Modified independent (Device/Increase time);With rails;HOB elevated Sitting - Scoot to Edge of Bed: 6: Modified independent (Device/Increase time);With rail Sit to Supine: 6: Modified independent (Device/Increase time);With rail;HOB elevated Details for Bed Mobility Assistance: pt pulling on railing to get to sitting EOB.   Transfers Transfers: Sit to Stand;Stand to Sit Sit to Stand: 7: Independent;From bed Stand to Sit: 7: Independent;To bed Ambulation/Gait Ambulation/Gait Assistance: 5: Supervision Ambulation Distance (Feet): 300 Feet Assistive device: None Ambulation/Gait Assistance Details: supervision for safety and monitoring of vitals Gait Pattern: Within Functional Limits Gait velocity: midlly decreased General Gait Details: O2 sats with gait on RA: 87%, 90%, 91%  PT Diagnosis: Difficulty walking;Abnormality of gait;Generalized weakness  PT Problem List: Decreased activity tolerance;Cardiopulmonary status limiting activity;Decreased knowledge of precautions PT Treatment Interventions: Gait training;Stair training;Functional mobility training;Therapeutic activities;Therapeutic exercise;Balance training;Neuromuscular  re-education;Patient/family education     PT Goals(Current goals can be found in the care plan section) Acute Rehab PT Goals Patient Stated Goal: to go home, get back to normal PT Goal Formulation: With patient/family Time For Goal Achievement: 07/18/13 Potential to Achieve Goals: Good Additional Goals Additional Goal #1: Pt will ambulate at a community ambulator speed (>2.62 ft/sec) with DOE <2/4.  Visit Information  Last PT Received On: 07/04/13 Assistance Needed: +1 History of Present Illness: 72 y.o. female admitted to [facility] on 07/02/13 with history of non-insulin dependant DMII, HTN, Hyperlipidemia, who presented to [facility] hospital 10/30 with a multiofocal pneumonia and sepsis. She had been in her usual state of health, and the day prior had walked 2 miles with her daughter. She was initially admitted to the ICU and treated with IV abx and fluids. Also with acute on chronic kidney injury, anemia on presentation.  She was transferred out of the ICU on 11/1, however around 5p that evening she developed some SOB, and chest/abd pain.  She required a 100% NRB and transfer back to ICU. BP remained stable but her chest pain persisted. Troponin was checked and was elevated along with elevated BNP, JVP on exam, and likely pulmonary edema, all consistent with CHF. She eventually needed BiPap went through a heart cath on 07/02/13 (negative for ischemia) and is now trying to wean from O2 [place] to RA.         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    Cognition  Cognition Arousal/Alertness: Awake/alert Behavior During Therapy: WFL for tasks assessed/performed Overall Cognitive Status: Within Functional Limits for tasks assessed    Extremity/Trunk Assessment Upper Extremity Assessment Upper Extremity Assessment: Overall WFL for tasks assessed Lower Extremity Assessment Lower Extremity Assessment: Overall WFL for tasks assessed Cervical / Trunk Assessment Cervical / Trunk Assessment: Other exceptions Cervical / Trunk Exceptions: pt reports h/o two back surgeries the first of which left her "paralyzed"   Balance Balance Balance Assessed: Yes Static Sitting Balance Static Sitting - Balance Support: Feet supported Static Sitting - Level of Assistance: 7: Independent Static Standing Balance Static Standing - Balance Support: No upper extremity supported Static Standing - Level of Assistance: 7: Independent Dynamic Standing Balance Dynamic Standing - Balance Support: No upper extremity supported Dynamic Standing - Level of Assistance: 5: Stand by assistance  End of Session PT - End of Session Equipment Utilized During Treatment: Gait belt Activity Tolerance: Patient limited by fatigue Patient left: in bed;with call bell/phone within reach;with family/visitor present Nurse Communication: Mobility status;Other (comment) (O2 sats, f/u recs)    Wells Guiles B. Marji Kuehnel, PT, DPT [phone]   07/04/2013, 5:31 PM

## 2013-07-04 NOTE — Progress Notes (Signed)
Spoke with [name] daughter, had conversation about HF and cardiac rhythm.  MD please follow up with family for update with rounding 11/6.  Carol Ada, RN

## 2013-07-04 NOTE — Progress Notes (Signed)
ANTICOAGULATION CONSULT NOTE - Follow Up Consult  Pharmacy Consult for Heparin  Indication: chest pain/ACS, afib during cath  Allergies  Allergen Reactions  . Codeine     Patient Measurements: Height: 5\' 2"  (157.5 cm) Weight: 123 lb 14.4 oz (56.2 kg) IBW/kg (Calculated) : 50.1  Vital Signs: Temp: 98.5 F (36.9 C) (11/04 2354) Temp src: Oral (11/04 2354) BP: 102/55 mmHg (11/05 0000) Pulse Rate: 65 (11/05 0000)  Labs:  Recent Labs  07/02/13 0224  07/02/13 0737  07/03/13 0510 07/03/13 1455 07/03/13 2340  HGB 11.8*  --   --   --  12.5  --   --   HCT 35.4*  --   --   --  36.3  --   --   PLT 208  --   --   --  207  --   --   APTT 120*  --   --   --   --   --   --   LABPROT 13.7  --   --   --   --   --   --   INR 1.07  --   --   --   --   --   --   HEPARINUNFRC  --   < >  --   < > 0.21* 0.27* 0.47  CREATININE 1.24*  --   --   --  1.33*  --   --   TROPONINI 0.64*  --  0.72*  --   --   --   --   < > = values in this interval not displayed.  Estimated Creatinine Clearance: 29.8 ml/min (by C-G formula based on Cr of 1.33).   Medications:  -Heparin 950 units/hr  Assessment:  Heparin level therapeutic at 0.47 with no bleeding noted.   Goal of Therapy:  Heparin level 0.3-0.7 units/ml Monitor platelets by anticoagulation protocol: Yes   Plan:  -continue heparin at current rate and follow up daily labs.  Curlene Dolphin

## 2013-07-04 NOTE — Progress Notes (Signed)
Pharmacy Consult - Lovenox   Changing Xarelto to Lovenox in anticipation of possible thoracentesis With renal insufficiency.  Plan: Begin Lovenox 55 mg sq Q 24 hours Continue to follow.  Thank you. Anette Guarneri, PharmD

## 2013-07-04 NOTE — Progress Notes (Signed)
SUBJECTIVE:   No chest pain or shortness of breath. Feeling a little better.   PHYSICAL EXAM Filed Vitals:   07/04/13 0300 07/04/13 0400 07/04/13 0500 07/04/13 0600  BP: 99/72 109/65 114/67 125/71  Pulse: 66 65 69 75  Temp:  98.2 F (36.8 C)    TempSrc:  Oral    Resp: 14 19 9 16   Height:      Weight:      SpO2: 92% 91% 91% 93%     General:  Acutely ill, no distress, sitting up in bed Lungs:  Decreased breath sounds bilaterally  Heart:  RRR Abdomen:  Positive bowel sounds, no rebound no guarding Extremities:  No edema Neck :  No JVD  LABS: Lab Results  Component Value Date   TROPONINI 0.72* 07/02/2013   Results for orders placed during the hospital encounter of 07/02/13 (from the past 24 hour(s))  GLUCOSE, CAPILLARY     Status: Abnormal   Collection Time    07/03/13  8:08 AM      Result Value Range   Glucose-Capillary 166 (*) 70 - 99 mg/dL  GLUCOSE, CAPILLARY     Status: Abnormal   Collection Time    07/03/13 12:52 PM      Result Value Range   Glucose-Capillary 150 (*) 70 - 99 mg/dL  HEPARIN LEVEL (UNFRACTIONATED)     Status: Abnormal   Collection Time    07/03/13  2:55 PM      Result Value Range   Heparin Unfractionated 0.27 (*) 0.30 - 0.70 IU/mL  GLUCOSE, CAPILLARY     Status: Abnormal   Collection Time    07/03/13  5:24 PM      Result Value Range   Glucose-Capillary 140 (*) 70 - 99 mg/dL  GLUCOSE, CAPILLARY     Status: Abnormal   Collection Time    07/03/13 10:10 PM      Result Value Range   Glucose-Capillary 160 (*) 70 - 99 mg/dL  HEPARIN LEVEL (UNFRACTIONATED)     Status: None   Collection Time    07/03/13 11:40 PM      Result Value Range   Heparin Unfractionated 0.47  0.30 - 0.70 IU/mL  CBC     Status: Abnormal   Collection Time    07/04/13  5:10 AM      Result Value Range   WBC 9.7  4.0 - 10.5 K/uL   RBC 3.95  3.87 - 5.11 MIL/uL   Hemoglobin 11.3 (*) 12.0 - 15.0 g/dL   HCT 34.3 (*) 36.0 - 46.0 %   MCV 86.8  78.0 - 100.0 fL   MCH 28.6  26.0 -  34.0 pg   MCHC 32.9  30.0 - 36.0 g/dL   RDW 15.5  11.5 - 15.5 %   Platelets 225  150 - 400 K/uL  HEPARIN LEVEL (UNFRACTIONATED)     Status: None   Collection Time    07/04/13  5:10 AM      Result Value Range   Heparin Unfractionated 0.62  0.30 - 0.70 IU/mL  BASIC METABOLIC PANEL     Status: Abnormal   Collection Time    07/04/13  5:10 AM      Result Value Range   Sodium 135  135 - 145 mEq/L   Potassium 3.7  3.5 - 5.1 mEq/L   Chloride 94 (*) 96 - 112 mEq/L   CO2 32  19 - 32 mEq/L   Glucose, Bld 163 (*) 70 - 99 mg/dL  BUN 27 (*) 6 - 23 mg/dL   Creatinine, Ser 1.47 (*) 0.50 - 1.10 mg/dL   Calcium 8.5  8.4 - 10.5 mg/dL   GFR calc non Af Amer 34 (*) >90 mL/min   GFR calc Af Amer 40 (*) >90 mL/min    Intake/Output Summary (Last 24 hours) at 07/04/13 0709 Last data filed at 07/04/13 0630  Gross per 24 hour  Intake 1873.7 ml  Output   1850 ml  Net   23.7 ml    DG port Chest X-ray 07/04/13: IIMPRESSION:  1. Persistent right lower lobe pneumonia with small parapneumonic pleural effusion.  2. Atherosclerosis.   EKG: 07/03/13    A/ fib, rate 80, QTc prolonged. T wave inversion in anteroseptal leads.   ASSESSMENT AND PLAN:  STEMI with acute HF, post cath A.fib:  Post cardiac cath on 11/3 after increased SOB, EKG changes, and elevated troponin. Cath findings negative for coronary disease but, per 2D echo, severe LV dysfunction (LV EF 20%) with akinesis of the mid anteroseptal, apical septal, mid and apical anterior walls and hypokinesis of the apical inferior and lateral walls. This is suggestive of a Tako-tsubo cardiomyopathy. She had intermittent A. Fib post-cath.  Breathing status has improved with increased diuresis for the past 3 days. She is now appears to be close to euvolemia per CXR and respiratory status. Slightly fluid net positive balance yesterday but a total of over 4.3L diuresis during this hospitalization.    -Continue ASA and heparin. -Lasix 20mg  IV once today, will  continue monitoring creatinine.  -Will uptitrate BB as BP permits for Takatsubo. Will add ACEi after discharge given recent acute on chronic renal failure at outside hospital and cath iodine contrast. -Continue amiodarone for intermittent A.fib.  -Start Protonix for chest discomfort (on Prilosec at home).  -repeat EKG   PNEUMONIA:  Consult CCM.  Continue current antibiotics.    Blain Pais, MD IM, PGY-II  07/04/2013 7:09 AM  Patient seen with resident, agree with the above note.  1. 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  Mildly prolonged QT interval and T wave inversions fits the stress cardiomyopathy diagnosis.  No CAD on cath.  Will stop low dose metoprolol today and start Coreg 3.125 mg bid.  She does not appear volume overloaded on exam so hold off on Lasix for now.  If creatinine remains stable, could try to add low dose ACEi prior to discharge.  Will need to repeat echo in 2-3 months to look for improvement.  2. CKD: Stable creatinine currently.  3. Atrial fibrillation: Went back into NSR yesterday.  Stop IV amiodarone, can put her on amiodarone 200 mg bid.  Stop IV heparin gtt. Will start Xarelto 15 mg daily.  Would probably continue amiodarone until followup echo and if EF is back to normal, stop it.  4. PNA: Per CCM.  5. Prolonged QT interval: Improving, 560 msec yesterday, 500 msec today.  Suspect related to stress cardiomyopathy.  However, she is on levofloxacin for PNA so will need to continue to follow and will need to stop levofloxacin as soon as appropriate.   Loralie Champagne 07/04/2013 8:28 AM

## 2013-07-04 NOTE — Progress Notes (Addendum)
PULMONARY  / CRITICAL CARE MEDICINE  Name: [name] MRN: [id number] DOB: 09-15-1940    ADMISSION DATE:  07/02/2013 CONSULTATION DATE:  07/02/2013  REFERRING MD :  Bensimhon PRIMARY SERVICE: Cardiology  BRIEF PATIENT DESCRIPTION: 72 y.o. F with DM2, HTN, HLD, presented to [facility] 10/30 and found to have RLL PNA and sepsis. Developed chest pain, SOB and had troponin bump with EKG changes & diffuse infiltrates c/w edema. Transferred to [facility] for PCI. Cath was negative for any ischemia, findings suggestive of likely Takatsubo disease. Developed AF-RVR & worse hypoxia post cath - Cardiology consulted PCCM for further management of her PNA.   SIGNIFICANT EVENTS / STUDIES:  10/30 - Admitted to [facility] for PNA, in ICU.  11/1 - Transferred out of ICU but developed SOB and chest pain.   LINES / TUBES:  Foley 11/2>>>  CULTURES:  Blood 10/30 [facility]) >>  ANTIBIOTICS:  Levaquin 11/3 >>> 11/5 Zosyn 11/3 >>>11/5 Augmentin 11/5 >>    SUBJECTIVE:  Much improved Denies fevers, chills, sweats, URI symptoms.   Ambulating    VITAL SIGNS: Temp:  [97.5 F (36.4 C)-98.5 F (36.9 C)] 97.6 F (36.4 C) (11/05 0800) Pulse Rate:  [41-75] 67 (11/05 0700) Resp:  [9-25] 16 (11/05 0700) BP: (83-125)/(45-72) 124/67 mmHg (11/05 0700) SpO2:  [91 %-100 %] 100 % (11/05 0800) FiO2 (%):  [2 %-4 %] 2 % (11/04 1600) HEMODYNAMICS:   VENTILATOR SETTINGS: Vent Mode:  [-]  FiO2 (%):  [2 %-4 %] 2 % INTAKE / OUTPUT: Intake/Output     11/04 0701 - 11/05 0700 11/05 0701 - 11/06 0700   P.O. 900 120   I.V. (mL/kg) 886.9 (15.8)    IV Piggyback 125    Total Intake(mL/kg) 1911.9 (34) 120 (2.1)   Urine (mL/kg/hr) 1850 (1.4)    Total Output 1850     Net +61.9 +120        Urine Occurrence 2 x    Stool Occurrence 5 x      PHYSICAL EXAMINATION: General:  Pleasant female, lying in bed, in NAD. Neuro:  A&O x 3.  Mood and affect appropriate. HEENT:  /AT.  PERRL.   MMM Cardiovascular:  RRR.  No M/R/G. Lungs:  Resp's even and unlabored.  Decreased breath sounds in right base, mild crackles in left.  No W/R/R. Abdomen:  BS x 4.  Soft, NT/ND. Musculoskeletal:  No gross deformities.  No edema. Skin:  Warm and dry.  LABS:  CBC Recent Labs     07/02/13  0224  07/03/13  0510  07/04/13  0510  WBC  14.2*  12.3*  9.7  HGB  11.8*  12.5  11.3*  HCT  35.4*  36.3  34.3*  PLT  208  207  225   Coag's Recent Labs     07/02/13  0224  APTT  120*  INR  1.07   BMET Recent Labs     07/02/13  0224  07/03/13  0510  07/04/13  0510  NA  132*  130*  135  K  4.3  3.2*  3.7  CL  97  88*  94*  CO2  23  29  32  BUN  14  20  27*  CREATININE  1.24*  1.33*  1.47*  GLUCOSE  133*  147*  163*   Electrolytes Recent Labs     07/02/13  0224  07/03/13  0510  07/04/13  0510  CALCIUM  8.7  8.6  8.5  MG  1.1*   --    --    ABG Recent Labs     07/02/13  0210  PHART  7.335*  PCO2ART  44.4  PO2ART  54.0*   Liver Enzymes Recent Labs     07/02/13  0224  AST  27  ALT  38*  ALKPHOS  165*  BILITOT  0.5  ALBUMIN  2.2*   Cardiac Enzymes Recent Labs     07/02/13  0224  07/02/13  0737  TROPONINI  0.64*  0.72*  PROBNP  30181.0*   --    Glucose Recent Labs     07/02/13  2138  07/03/13  0808  07/03/13  1252  07/03/13  1724  07/03/13  2210  07/04/13  0753  GLUCAP  175*  166*  150*  140*  160*  154*    Imaging Dg Chest Port 1 View  07/04/2013   CLINICAL DATA:  Shortness of breath. Evaluate for pneumonia or pulmonary edema.  EXAM: PORTABLE CHEST - 1 VIEW  COMPARISON:  CHEST x-ray 07/03/2013.  FINDINGS: Persistent consolidation in the right lower lobe, concerning for pneumonia. Small right pleural effusion. Left lung is clear. No evidence of pulmonary edema. Heart size is normal. Mediastinal contours are unremarkable. Atherosclerosis in the thoracic aorta.  IMPRESSION: 1. Persistent right lower lobe pneumonia with small parapneumonic pleural  effusion. 2. Atherosclerosis.   Electronically Signed   By: Vinnie Langton M.D.   On: 07/04/2013 06:38   Dg Chest Port 1 View  07/03/2013   CLINICAL DATA:  Shortness of breath  EXAM: PORTABLE CHEST - 1 VIEW  COMPARISON:  07/02/2013  FINDINGS: The cardiac shadow is stable. The diffuse vascular congestion and central edema has resolved in the interval. Persistent right basilar infiltrate is noted.  IMPRESSION: Improvement in vascular congestion although a persistent right basilar infiltrate is seen.   Electronically Signed   By: Inez Catalina M.D.   On: 07/03/2013 07:08     ASSESSMENT / PLAN:  PULMONARY A: Community Acquired Pneumonia - CXR this AM and clinical picture suggests PNA is improving. Hypoxia - in the setting of CAP. Pleural Effusion - Concern for Rt parapneumonic effusion. Mild Pulmonary Edema - improved. P:   - Supplemental O2 to maintain SpO2 > 93% - IS/pulmonary hygiene. - OOB as tolerated with assist. -bedside US to  INFECTIOUS A:  Leukocytosis - presumed secondary to CAP given hypoxia and CXR findings.  Improving Pneumonia P:   - dc levaquin, feel qTc more related to amio , use augmentin instead - Monitor WBC's/fever curve.  CARDIOVASCULAR A:  Chest Pain - s/p cath lab, found to have no ischemia but likely Takatsubo Disease. Takatsubo Disease - given clear cath Atrial Fibrillation - s/p cardiac cath, on Amiodarone now. Troponin Leak - Presumed secondary to Takatsubo disease. P:  - manage per Cardiology.   -hold rivaroxaban, use lovenox - may need thora procedure  RENAL A:  AKI - Unclear etiology.  Cr down from admission at [facility].   Hypokalemia - presumed secondary to lasix P:   - Monitor BMP.    HEMATOLOGIC A:  Anemia - improved. P:   - Stable   ENDOCRINE A:  DM II - Chronic. P:   - SSI   Can transfer to tele in my opinion  Kara Mead MD. [facility]. [place] [facility] Pager [phone] If no response call [phone]

## 2013-07-04 NOTE — Progress Notes (Signed)
ANTICOAGULATION CONSULT NOTE - Initial Consult  Pharmacy Consult for Xarelto Indication: atrial fibrillation  Allergies  Allergen Reactions  . Codeine     Patient Measurements: Height: 5\' 2"  (157.5 cm) Weight: 123 lb 14.4 oz (56.2 kg) IBW/kg (Calculated) : 50.1  Vital Signs: Temp: 98.2 F (36.8 C) (11/05 0400) Temp src: Oral (11/05 0400) BP: 124/67 mmHg (11/05 0700) Pulse Rate: 67 (11/05 0700)  Labs:  Recent Labs  07/02/13 0224  07/02/13 0737  07/03/13 0510 07/03/13 1455 07/03/13 2340 07/04/13 0510  HGB 11.8*  --   --   --  12.5  --   --  11.3*  HCT 35.4*  --   --   --  36.3  --   --  34.3*  PLT 208  --   --   --  207  --   --  225  APTT 120*  --   --   --   --   --   --   --   LABPROT 13.7  --   --   --   --   --   --   --   INR 1.07  --   --   --   --   --   --   --   HEPARINUNFRC  --   < >  --   < > 0.21* 0.27* 0.47 0.62  CREATININE 1.24*  --   --   --  1.33*  --   --  1.47*  TROPONINI 0.64*  --  0.72*  --   --   --   --   --   < > = values in this interval not displayed.  Estimated Creatinine Clearance: 27 ml/min (by C-G formula based on Cr of 1.47).   Medical History: Past Medical History  Diagnosis Date  . GERD (gastroesophageal reflux disease)   . Hypercholesterolemia   . Diabetes mellitus   . Hypertension   . Bursitis   . FHx: migraine headaches   . Renal lithiasis   . 10 year risk of MI or stroke 7.5% or greater     Medications:  Scheduled:  . amiodarone  200 mg Oral BID  . atorvastatin  40 mg Oral q1800  . carvedilol  3.125 mg Oral BID WC  . FLUoxetine  40 mg Oral Daily  . insulin aspart  0-15 Units Subcutaneous TID WC  . levofloxacin (LEVAQUIN) IV  500 mg Intravenous Q48H  . pantoprazole  40 mg Oral Daily  . rivaroxaban  15 mg Oral Q supper  . sodium chloride  3 mL Intravenous Q12H    Assessment: 72 y/o F s/p cath on 11/3, went into afib during cath and was started on amio drip/hep drip. Stopped heparin and starting Xarelto due to  increased propensity to go back into atrial fibrillation. CBC is stable, however patient has poor renal function (eCrCl 27 mL/min). Patient has also transitioned from IV to PO amio.  Goal of Therapy:  Monitor platelets by anticoagulation protocol: Yes   Plan:  Start Xarelto 15 mg PO daily with supper Monitor renal function closely, CBC, and signs of bleed  Gwendlyn Deutscher M 07/04/2013,9:25 AM

## 2013-07-05 ENCOUNTER — Inpatient Hospital Stay (HOSPITAL_COMMUNITY): Payer: Medicare Other

## 2013-07-05 DIAGNOSIS — A419 Sepsis, unspecified organism: Secondary | ICD-10-CM

## 2013-07-05 LAB — CBC
Hemoglobin: 11.5 g/dL — ABNORMAL LOW (ref 12.0–15.0)
MCH: 28.9 pg (ref 26.0–34.0)
MCV: 88.4 fL (ref 78.0–100.0)
Platelets: 262 10*3/uL (ref 150–400)
RBC: 3.98 MIL/uL (ref 3.87–5.11)
RDW: 15.7 % — ABNORMAL HIGH (ref 11.5–15.5)
WBC: 8.3 10*3/uL (ref 4.0–10.5)

## 2013-07-05 LAB — GLUCOSE, CAPILLARY
Glucose-Capillary: 152 mg/dL — ABNORMAL HIGH (ref 70–99)
Glucose-Capillary: 116 mg/dL — ABNORMAL HIGH (ref 70–99)

## 2013-07-05 LAB — HEPARIN LEVEL (UNFRACTIONATED): Heparin Unfractionated: <0.1 IU/mL — ABNORMAL LOW (ref 0.30–0.70)

## 2013-07-05 NOTE — Progress Notes (Signed)
   Patient Saturations on Room Air at Rest = 97 %  Patient Saturations on Room Air while Ambulating = 91 %   [name]

## 2013-07-05 NOTE — Progress Notes (Signed)
    SUBJECTIVE: No chest pain.  Weak.  Walked with PT    PHYSICAL EXAM Filed Vitals:   07/04/13 1951 07/04/13 2002 07/05/13 0420 07/05/13 0426  BP:  96/62 143/86   Pulse:  78 70 73  Temp:  98.8 F (37.1 C) 97.8 F (36.6 C)   TempSrc:   Oral   Resp:  18 18 16   Height:      Weight:      SpO2: 93% 91% 88% 95%   General:  Acutely ill, no distress Lungs:Decreased breath sounds right greater than left. Heart:  RRR Abdomen:  Positive bowel sounds, no rebound no guarding Extremities:  No edema   LABS:  Results for orders placed during the hospital encounter of 07/02/13 (from the past 24 hour(s))  GLUCOSE, CAPILLARY     Status: Abnormal   Collection Time    07/04/13  7:53 AM      Result Value Range   Glucose-Capillary 154 (*) 70 - 99 mg/dL  GLUCOSE, CAPILLARY     Status: Abnormal   Collection Time    07/04/13 11:54 AM      Result Value Range   Glucose-Capillary 139 (*) 70 - 99 mg/dL  GLUCOSE, CAPILLARY     Status: Abnormal   Collection Time    07/04/13  5:27 PM      Result Value Range   Glucose-Capillary 168 (*) 70 - 99 mg/dL  GLUCOSE, CAPILLARY     Status: Abnormal   Collection Time    07/04/13  8:01 PM      Result Value Range   Glucose-Capillary 176 (*) 70 - 99 mg/dL  CBC     Status: Abnormal   Collection Time    07/05/13  5:56 AM      Result Value Range   WBC 8.3  4.0 - 10.5 K/uL   RBC 3.98  3.87 - 5.11 MIL/uL   Hemoglobin 11.5 (*) 12.0 - 15.0 g/dL   HCT 35.2 (*) 36.0 - 46.0 %   MCV 88.4  78.0 - 100.0 fL   MCH 28.9  26.0 - 34.0 pg   MCHC 32.7  30.0 - 36.0 g/dL   RDW 15.7 (*) 11.5 - 15.5 %   Platelets 262  150 - 400 K/uL    Intake/Output Summary (Last 24 hours) at 07/05/13 0654 Last data filed at 07/04/13 0800  Gross per 24 hour  Intake  158.2 ml  Output      0 ml  Net  158.2 ml    ASSESSMENT AND PLAN:  NSTEMI:    Takatsubo.  Coreg started yesterday.   BP is up.  However, still holding on ARB with mild CKD.  Continue low dose beta blocker.    PNEUMONIA:  Continue antibiotics per CCM.   Holding on Xarelto until we know that there are no further invasive procedures planned.  Start 15 mg daily if no procedures. Stop Lovenox at that point.   ATRIAL FIB:  NSR.  On amiodarone.   Otherwise as above.   CKD:  Creat up slightly.  Follow  PROLONGED QT:   No symptomatic arrhythmias.     Jeneen Rinks [facility] 07/05/2013 6:54 AM

## 2013-07-05 NOTE — Progress Notes (Signed)
Physical Therapy Treatment Patient Details Name: [name] MRN: [id number] DOB: 06/10/1941 Today's Date: 07/05/2013 Time: [id number] PT Time Calculation (min): 13 min  PT Assessment / Plan / Recommendation  History of Present Illness 72 y.o. female admitted to [facility] on 07/02/13 with history of non-insulin dependant DMII, HTN, Hyperlipidemia, who presented to [facility] hospital 10/30 with a multiofocal pneumonia and sepsis. She had been in her usual state of health, and the day prior had walked 2 miles with her daughter. She was initially admitted to the ICU and treated with IV abx and fluids. Also with acute on chronic kidney injury, anemia on presentation.  She was transferred out of the ICU on 11/1, however around 5p that evening she developed some SOB, and chest/abd pain.  She required a 100% NRB and transfer back to ICU. BP remained stable but her chest pain persisted. Troponin was checked and was elevated along with elevated BNP, JVP on exam, and likely pulmonary edema, all consistent with CHF. She eventually needed BiPap went through a heart cath on 07/02/13 (negative for ischemia) and is now trying to wean from O2 [place] to RA.     PT Comments   Pt making great progress. SpO2 91-94% on RA during ambulation. Some loss of balance with high level balance activities such as head turns. Will continue to address but pt on track for D/C home.  Follow Up Recommendations  No PT follow up;Supervision - Intermittent;Other (comment) (maybe HHRN for CHF education and some initial vitals monitor)           Equipment Recommendations  None recommended by PT       Frequency Min 3X/week   Progress towards PT Goals Progress towards PT goals: Progressing toward goals  Plan Current plan remains appropriate    Precautions / Restrictions Precautions Precautions: Other (comment) Precaution Comments: monitor O2 sats with gait.    Pertinent Vitals/Pain No c/o    Mobility  Transfers Transfers: Sit to  Stand;Stand to Sit Sit to Stand: 7: Independent;From chair/3-in-1 Stand to Sit: 7: Independent;To chair/3-in-1 Ambulation/Gait Ambulation/Gait Assistance: 5: Supervision;4: Min assist Ambulation Distance (Feet): 800 Feet Assistive device: None Ambulation/Gait Assistance Details: Supervision with normal ambulation, min assist with high level balance challenges (see balance section).  Gait Pattern: Within Functional Limits Gait velocity: midlly decreased General Gait Details: O2 sats with gait on RA: 91-94% Stairs: No     PT Goals (current goals can now be found in the care plan section) Acute Rehab PT Goals Patient Stated Goal: to go home, get back to normal  Visit Information  Last PT Received On: 07/05/13 Assistance Needed: +1 History of Present Illness: 72 y.o. female admitted to [facility] on 07/02/13 with history of non-insulin dependant DMII, HTN, Hyperlipidemia, who presented to [facility] hospital 10/30 with a multiofocal pneumonia and sepsis. She had been in her usual state of health, and the day prior had walked 2 miles with her daughter. She was initially admitted to the ICU and treated with IV abx and fluids. Also with acute on chronic kidney injury, anemia on presentation.  She was transferred out of the ICU on 11/1, however around 5p that evening she developed some SOB, and chest/abd pain.  She required a 100% NRB and transfer back to ICU. BP remained stable but her chest pain persisted. Troponin was checked and was elevated along with elevated BNP, JVP on exam, and likely pulmonary edema, all consistent with CHF. She eventually needed BiPap went through a heart cath on 07/02/13 (negative  for ischemia) and is now trying to wean from O2 [name] to RA.      Subjective Data  Patient Stated Goal: to go home, get back to normal   Cognition  Cognition Arousal/Alertness: Awake/alert Behavior During Therapy: WFL for tasks assessed/performed Overall Cognitive Status: Within Functional Limits for  tasks assessed    Balance  Balance Balance Assessed: Yes Static Sitting Balance Static Sitting - Balance Support: Feet supported Static Sitting - Level of Assistance: 7: Independent Static Standing Balance Static Standing - Balance Support: No upper extremity supported Static Standing - Level of Assistance: 7: Independent Dynamic Standing Balance Dynamic Standing - Balance Support: No upper extremity supported Dynamic Standing - Level of Assistance: 5: Stand by assistance High Level Balance High Level Balance Activites: Head turns;Backward walking;Direction changes;Sudden stops;Turns High Level Balance Comments: Min assist for vertical head turns/upward gaze and occasionally for horizontal head turns.   End of Session PT - End of Session Equipment Utilized During Treatment: Gait belt Activity Tolerance: Patient tolerated treatment well Patient left: with call bell/phone within reach;in chair Nurse Communication: Mobility status;Other (comment) (O2 sats)   GP     Lahoma Rocker 07/05/2013, 1:41 PM

## 2013-07-05 NOTE — Progress Notes (Signed)
PULMONARY  / CRITICAL CARE MEDICINE  Name: [name] MRN: [id number] DOB: [birth year]    ADMISSION DATE:  07/02/2013 CONSULTATION DATE:  07/02/2013  REFERRING MD :  Bensimhon PRIMARY SERVICE: Cardiology  BRIEF PATIENT DESCRIPTION: 72 y.o. F with DM2, HTN, HLD, presented to [facility] 10/30 and found to have RLL PNA and sepsis. Developed chest pain, SOB and had troponin bump with EKG changes & diffuse infiltrates c/w edema. Transferred to [facility] for PCI. Cath was negative for any ischemia, findings suggestive of likely Takatsubo disease. Developed AF-RVR & worse hypoxia post cath - Cardiology consulted PCCM for further management of her PNA.   SIGNIFICANT EVENTS / STUDIES:  10/30 - Admitted to [facility] for PNA, in ICU.  11/1 - Transferred out of ICU but developed SOB and chest pain.   LINES / TUBES:  Foley 11/2>>>  CULTURES:  Blood 10/30 [facility]) >>  ANTIBIOTICS:  Levaquin 11/3 >>> 11/5 Zosyn 11/3 >>>11/5 Augmentin 11/5 >>  SUBJECTIVE:  Much improved Denies fevers, chills, sweats, URI symptoms.   Ambulating    VITAL SIGNS: Temp:  [97.5 F (36.4 C)-98.8 F (37.1 C)] 97.8 F (36.6 C) (11/06 0420) Pulse Rate:  [69-78] 73 (11/06 0426) Resp:  [15-18] 16 (11/06 0426) BP: (96-143)/(62-86) 143/86 mmHg (11/06 0420) SpO2:  [84 %-95 %] 95 % (11/06 0426) HEMODYNAMICS:   VENTILATOR SETTINGS:   INTAKE / OUTPUT: Intake/Output     11/05 0701 - 11/06 0700 11/06 0701 - 11/07 0700   P.O. 120 240   I.V. (mL/kg)     IV Piggyback     Total Intake(mL/kg) 120 (2.1) 240 (4.3)   Urine (mL/kg/hr)     Total Output       Net +120 +240          PHYSICAL EXAMINATION: General:  Pleasant female, lying in bed, in NAD. Neuro:  A&O x 3.  Mood and affect appropriate. HEENT:  [place]/AT.  PERRL.  MMM Cardiovascular:  RRR.  No M/R/G. Lungs:  Resp's even and unlabored.  Decreased breath sounds in right base, mild crackles in left.  No W/R/R. Abdomen:  BS x 4.  Soft,  NT/ND. Musculoskeletal:  No gross deformities.  No edema. Skin:  Warm and dry.  LABS:  CBC Recent Labs     07/03/13  0510  07/04/13  0510  07/05/13  0556  WBC  12.3*  9.7  8.3  HGB  12.5  11.3*  11.5*  HCT  36.3  34.3*  35.2*  PLT  207  225  262   Coag's No results found for this basename: APTT, INR,  in the last 72 hours BMET Recent Labs     07/03/13  0510  07/04/13  0510  NA  130*  135  K  3.2*  3.7  CL  88*  94*  CO2  29  32  BUN  20  27*  CREATININE  1.33*  1.47*  GLUCOSE  147*  163*   Electrolytes Recent Labs     07/03/13  0510  07/04/13  0510  CALCIUM  8.6  8.5   ABG No results found for this basename: PHART, PCO2ART, PO2ART,  in the last 72 hours Liver Enzymes No results found for this basename: AST, ALT, ALKPHOS, BILITOT, ALBUMIN,  in the last 72 hours Cardiac Enzymes No results found for this basename: TROPONINI, PROBNP,  in the last 72 hours Glucose Recent Labs     07/03/13  2210  07/04/13  0753  07/04/13  1154  07/04/13  1727  07/04/13  2001  07/05/13  0737  GLUCAP  160*  154*  139*  168*  176*  152*    Imaging Dg Chest Bilateral Decubitus  07/05/2013   CLINICAL DATA:  Pneumonia and pleural effusion.  EXAM: CHEST - BILATERAL DECUBITUS VIEW  COMPARISON:  07/04/2013  FINDINGS: Bilateral decubitus views demonstrate a layering small to moderate sized right pleural effusion and a small left pleural effusion. No pneumothorax is identified.  IMPRESSION: Bilateral layering pleural effusions, right greater than left.   Electronically Signed   By: Aletta Edouard M.D.   On: 07/05/2013 08:21   Dg Chest Port 1 View  07/04/2013   CLINICAL DATA:  Shortness of breath. Evaluate for pneumonia or pulmonary edema.  EXAM: PORTABLE CHEST - 1 VIEW  COMPARISON:  CHEST x-ray 07/03/2013.  FINDINGS: Persistent consolidation in the right lower lobe, concerning for pneumonia. Small right pleural effusion. Left lung is clear. No evidence of pulmonary edema. Heart size is  normal. Mediastinal contours are unremarkable. Atherosclerosis in the thoracic aorta.  IMPRESSION: 1. Persistent right lower lobe pneumonia with small parapneumonic pleural effusion. 2. Atherosclerosis.   Electronically Signed   By: Vinnie Langton M.D.   On: 07/04/2013 06:38     ASSESSMENT / PLAN:  PULMONARY A: Community Acquired Pneumonia  Pleural Effusion - Concern for Rt parapneumonic effusion. Mild Pulmonary Edema - improved. P:   - Supplemental O2 to maintain SpO2 > 93%, wean as tolerated. - IS/pulmonary hygiene. - OOB as tolerated with assist. -diuresis as tolerated -would not perform thora, no distress, feeling better, hope can maximzie diuresis, although crt noted -risk outwt benefit at this stage -will need outpt pcxr follow up -can start anticoagulants / per cards  INFECTIOUS A:  Leukocytosis - presumed secondary to CAP given hypoxia and CXR findings.  Improving Pneumonia P:   Augmentin, consider total abx 8 days  CARDIOVASCULAR A:  Chest Pain - s/p cath lab, found to have no ischemia but likely Takatsubo Disease. Takatsubo Disease - given clear cath Atrial Fibrillation - s/p cardiac cath, on Amiodarone now. Troponin Leak - Presumed secondary to Takatsubo disease. P:  - manage per Cardiology.   -do not anticipate thora, add rivaroxaban as planned by cards  RENAL Lab Results  Component Value Date   CREATININE 1.47* 07/04/2013   CREATININE 1.33* 07/03/2013   CREATININE 1.24* 07/02/2013    A:  AKI - Unclear etiology.  Cr down from admission at [facility].   Hypokalemia - presumed secondary to lasix P:   - Monitor BMP.  -consider neg 500 cc balance  HEMATOLOGIC  Recent Labs  07/04/13 0510 07/05/13 0556  HGB 11.3* 11.5*    A:  Anemia - improved. P:   - Stable   ENDOCRINE A:  DM II - Chronic. P:   - SSI Summary: Diuresis as tolerated. No need for thoracentesis. PCCM will sign off, please call if needed.   Richardson Landry Minor ACNP Maryanna Shape  PCCM Pager [phone] till 3 pm If no answer page [phone] 07/05/2013, 10:18 AM  I have fully examined this patient and agree with above findings.    And edited infill  Lavon Paganini. Titus Mould, MD, [place] Pgr: [facility]

## 2013-07-06 ENCOUNTER — Ambulatory Visit: Payer: Medicare Other

## 2013-07-06 ENCOUNTER — Telehealth: Payer: Self-pay | Admitting: *Deleted

## 2013-07-06 DIAGNOSIS — E7800 Pure hypercholesterolemia, unspecified: Secondary | ICD-10-CM

## 2013-07-06 DIAGNOSIS — I10 Essential (primary) hypertension: Secondary | ICD-10-CM

## 2013-07-06 DIAGNOSIS — R5381 Other malaise: Secondary | ICD-10-CM | POA: Diagnosis not present

## 2013-07-06 LAB — BASIC METABOLIC PANEL
BUN: 24 mg/dL — ABNORMAL HIGH (ref 6–23)
CO2: 28 mEq/L (ref 19–32)
Calcium: 9.1 mg/dL (ref 8.4–10.5)
Chloride: 99 mEq/L (ref 96–112)
Creatinine, Ser: 1.21 mg/dL — ABNORMAL HIGH (ref 0.50–1.10)
GFR calc non Af Amer: 43 mL/min — ABNORMAL LOW (ref >90)
Glucose, Bld: 154 mg/dL — ABNORMAL HIGH (ref 70–99)
Potassium: 3.8 mEq/L (ref 3.5–5.1)
Sodium: 136 mEq/L (ref 135–145)

## 2013-07-06 LAB — GLUCOSE, CAPILLARY: Glucose-Capillary: 108 mg/dL — ABNORMAL HIGH (ref 70–99)

## 2013-07-06 MED ORDER — RIVAROXABAN 15 MG PO TABS: 15.0000 mg | ORAL_TABLET | Freq: Every day | ORAL | Status: DC

## 2013-07-06 MED ORDER — FUROSEMIDE 20 MG PO TABS: 20.0000 mg | ORAL_TABLET | Freq: Every day | ORAL | Status: DC

## 2013-07-06 MED ORDER — AMIODARONE HCL 200 MG PO TABS: 200.0000 mg | ORAL_TABLET | Freq: Two times a day (BID) | ORAL | Status: DC

## 2013-07-06 MED ORDER — ATORVASTATIN CALCIUM 40 MG PO TABS: 40.0000 mg | ORAL_TABLET | Freq: Every day | ORAL | Status: DC

## 2013-07-06 MED ORDER — AMOXICILLIN-POT CLAVULANATE 875-125 MG PO TABS: 1.0000 | ORAL_TABLET | Freq: Two times a day (BID) | ORAL | Status: DC

## 2013-07-06 MED ORDER — CARVEDILOL 3.125 MG PO TABS: 3.1250 mg | ORAL_TABLET | Freq: Two times a day (BID) | ORAL | Status: DC

## 2013-07-06 MED ORDER — CARVEDILOL 6.25 MG PO TABS: 6.2500 mg | ORAL_TABLET | Freq: Two times a day (BID) | ORAL | Status: DC

## 2013-07-06 NOTE — Telephone Encounter (Signed)
TCM F/U ON 07/12/13 WITH KL AT 1:10

## 2013-07-06 NOTE — Discharge Summary (Signed)
Ena Dawley, H 07/06/2013

## 2013-07-06 NOTE — Discharge Summary (Signed)
CARDIOLOGY DISCHARGE SUMMARY   Patient ID: [name] MRN: [id number] DOB/AGE: 73-May-1942 73 y.o.  Admit date: 07/02/2013 Discharge date: 07/06/2013  Primary Discharge Diagnosis:   NSTEMI (non-ST elevated myocardial infarction) - Takotsubo cardiomyopathy  Secondary Discharge Diagnosis:   DM   HYPERCHOLESTEROLEMIA   HYPERTENSION   GERD   Community acquired pneumonia   Sepsis(995.91)   Acute respiratory failure   Acute systolic CHF (congestive heart failure)   Paroxysmal atrial fibrillation   Deconditioning   Anticoagulation with Xarelto  Consults: CCM  Procedures: Left Heart Cath, Selective Coronary Angiography, LV angiography, 2-D echocardiogram  Hospital Course: [name] is a 72 y.o. female with no history of CAD. She went to [facility] with shortness of breath and was found to have pneumonia and sepsis. She was admitted here but then developed chest pain and had an elevated troponin. She was transferred to [facility] and admitted for further evaluation and treatment.  1. Non-STEMI: She was taken to the cath lab on 07/02/2013. She had no coronary artery disease. She was having heart failure and her creatinine was elevated so this was felt likely Takotsubo disease. She went into rapid atrial fibrillation during the case was treated with amiodarone plus IV Lasix for her heart failure. A 2-D echocardiogram was performed with her EF 20-25%. She has renal insufficiency, so an ACE inhibitor was not added to her medication regimen. However, she is tolerating a low dose of a beta blocker. Because of the atrial fibrillation, she will need full anticoagulation. Therefore, no aspirin was added to her medication regimen. She is to get a followup echocardiogram in 4-6 weeks. She will have a home health RN after discharge.  2. Acute respiratory failure/community-acquired pneumonia/sepsis: She had initially been started on antibiotics at [facility]. The  critical care medicine consult was called to assist in managing her respiratory issues and her sepsis. She was on Levaquin and Zosyn for her pneumonia. Her sepsis improved. Her chest x-ray showed a pleural effusion and there was concern for a parapneumonic effusion. This was followed closely throughout her stay and there was concern she would need thoracentesis. However, she had improved with diuretic therapy and therefore thoracentesis was not needed. Because of concerns about medication interactions, her antibiotic therapy was changed from Levaquin to Augmentin. She is to complete oral Augmentin as an outpatient.  3. acute systolic CHF: Her chest x-ray showed heart failure in addition to the pleural fusions. Her BNP was significantly elevated at greater than 30,000. She initially required BiPAP but improved with diuresis. She was diuresed. Her renal function was followed closely and she did not have a significant change in her creatinine. Her BUN did go up to 27 with diuresis but her diuretics were changed to oral medications and her BUN was 24 with creatinine of 1.21 at discharge. This will be followed closely as an outpatient. At discharge, her oxygen saturation was checked and remained greater than 90% with ambulation. She is not currently on diuretics  4. Chronic kidney disease: 2 years ago, her BUN was 29 and creatinine 1.27. On admission her BUN was 14 with creatinine 1.24. Her GFR was 42. Her GFR got as low as 34 with diuresis. Once her diuretics were changed to oral medications her creatinine improved and her GFR was 43 at discharge.  5. Diabetes: She has a history of diabetes but is not on any medications for this. Her blood sugars were managed with sliding scale insulin. She is encouraged  to stick tightly to a diabetic diet and followup with her primary care physician.  6. Hyperlipidemia: She had been on Lipitor 20 mg prior to admission. This was increased to 40 mg daily and she is to followup  with cardiology her primary care for a lipid profile and liver function testing in 6 weeks.  7. GERD: She has a history of reflux and had been on omeprazole prior to admission at 20 mg daily. She was on protonic while hospitalized but can resume the omeprazole at discharge.  8. Hypertension: She had been on lisinopril/HCTZ prior to admission. This was held because of hypotension due to sepsis and acute on chronic renal insufficiency. She will be on a low dose of Lasix at discharge and a beta blocker. Currently her blood pressure is under good control.  9. Atrial fibrillation: She developed rapid atrial fibrillation during the cath. She was started on IV amiodarone which was changed to oral medication once her condition improved. She spontaneously converted to sinus rhythm and was maintaining sinus rhythm at discharge on amiodarone and a beta blocker.  10. Anticoagulation: She was initially on heparin/Lovenox but then was planned to be started on Xarelto. However, because of the concerns about possible thoracentesis, this was not done until discharge. At discharge she will be on Xarelto but no aspirin and follow up as an outpatient.  11. Deconditioning: She was felt to be deconditioned because of her hospital stay. She was seen by physical therapy and her oxygen saturation remained greater than 90% with ambulation. Her ambulation improved as they continue to work with her. By discharge, she was considered stable for intermittent supervision and no PT followup is recommended.   By 07/06/2013, [name] was feeling better. She was evaluated by Dr. Meda [name] and considered stable for discharge, in improved condition, to be followed closely as an outpatient.  Labs:  Lab Results  Component Value Date   WBC 8.3 07/05/2013   HGB 11.5* 07/05/2013   HCT 35.2* 07/05/2013   MCV 88.4 07/05/2013   PLT 262 07/05/2013    Recent Labs Lab 07/02/13 0224  07/06/13 0420  NA 132*  < > 136  K 4.3  < > 3.8  CL 97  <  > 99  CO2 23  < > 28  BUN 14  < > 24*  CREATININE 1.24*  < > 1.21*  CALCIUM 8.7  < > 9.1  PROT 6.4  --   --   BILITOT 0.5  --   --   ALKPHOS 165*  --   --   ALT 38*  --   --   AST 27  --   --   GLUCOSE 133*  < > 154*  < > = values in this interval not displayed.  Lab Results  Component Value Date   TROPONINI 0.72* 07/02/2013   Pro B Natriuretic peptide (BNP)  Date/Time Value Range Status  07/02/2013  2:24 AM 30181.0* 0 - 125 pg/mL Final      Radiology: Dg Chest Bilateral Decubitus 07/05/2013   CLINICAL DATA:  Pneumonia and pleural effusion.  EXAM: CHEST - BILATERAL DECUBITUS VIEW  COMPARISON:  07/04/2013  FINDINGS: Bilateral decubitus views demonstrate a layering small to moderate sized right pleural effusion and a small left pleural effusion. No pneumothorax is identified.  IMPRESSION: Bilateral layering pleural effusions, right greater than left.   Electronically Signed   By: Aletta Edouard M.D.   On: 07/05/2013 08:21   Dg Chest Port 1 View 07/04/2013  CLINICAL DATA:  Shortness of breath. Evaluate for pneumonia or pulmonary edema.  EXAM: PORTABLE CHEST - 1 VIEW  COMPARISON:  CHEST x-ray 07/03/2013.  FINDINGS: Persistent consolidation in the right lower lobe, concerning for pneumonia. Small right pleural effusion. Left lung is clear. No evidence of pulmonary edema. Heart size is normal. Mediastinal contours are unremarkable. Atherosclerosis in the thoracic aorta.  IMPRESSION: 1. Persistent right lower lobe pneumonia with small parapneumonic pleural effusion. 2. Atherosclerosis.   Electronically Signed   By: Vinnie Langton M.D.   On: 07/04/2013 06:38   Dg Chest Port 1 View 07/03/2013   CLINICAL DATA:  Shortness of breath  EXAM: PORTABLE CHEST - 1 VIEW  COMPARISON:  07/02/2013  FINDINGS: The cardiac shadow is stable. The diffuse vascular congestion and central edema has resolved in the interval. Persistent right basilar infiltrate is noted.  IMPRESSION: Improvement in vascular congestion  although a persistent right basilar infiltrate is seen.   Electronically Signed   By: Inez Catalina M.D.   On: 07/03/2013 07:08   Dg Chest Port 1 View 07/02/2013   CLINICAL DATA:  Hypoxia.  EXAM: PORTABLE CHEST - 1 VIEW  COMPARISON:  CHEST x-ray 07/01/2013.  FINDINGS: Diffuse interstitial prominence and patchy predominantly perihilar airspace disease, concerning for a background of moderate pulmonary edema. There is more focal airspace consolidation in the periphery of the right lung at the base, which may suggest superimposed infection or sequela of aspiration. Probable small a moderate right-sided pleural effusion. Heart size appears borderline enlarged. The patient is rotated to the right on today's exam, resulting in distortion of the mediastinal contours and reduced diagnostic sensitivity and specificity for mediastinal pathology.  IMPRESSION: 1. The appearance of the chest is similar to the prior study. There appears to be a background of moderate pulmonary edema, but there is a more focal airspace consolidation at the right base concerning for superimposed infection or sequela of aspiration, likely with moderate right-sided pleural effusion.   Electronically Signed   By: Vinnie Langton M.D.   On: 07/02/2013 02:23   Cardiac catheterization: 07/02/2013 Coronary dominance: right  Left mainstem: Normal  Left anterior descending (LAD): Normal  D1: normal  D2: normal  Left circumflex (LCx): Normal  OM1: normal  Right coronary artery (RCA): large dominant vessel normal  Left ventriculography: not done due to previous elevation in Cr and CHF  Final Conclusions: Primus Bravo disease Patient went into rapid afib during the case. Rx with amiodarone  Recommendations: Continue supportive care for pneumonia , sepsis CHF and PAF. Given iv lasix in lab 10 mg iv and iv amiodarone  Nitro decreased to 5ug/min  EKG: 07/04/2013 Sinus rhythm Vent. rate 70 BPM PR interval 192 ms QRS duration 70 ms QT/QTc  464/501 ms P-R-T axes 12 -9 23  Echo: 07/02/2013 Conclusions - Left ventricle: The cavity size was normal. Wall thickness was normal. Systolic function was severely reduced. The estimated ejection fraction was in the range of 20% to 25%. There is akinesis of the anteroseptal myocardium. There is akinesis of the apical myocardium. There is akinesis of the inferior myocardium. Doppler parameters are consistent with abnormal left ventricular relaxation (grade 1 diastolic dysfunction). - Mitral valve: Mild regurgitation. - Right ventricle: Systolic function was mildly reduced. - Pulmonary arteries: Systolic pressure was mildly increased. - Pericardium, extracardiac: A small pericardial effusion was identified.  FOLLOW UP PLANS AND APPOINTMENTS Allergies  Allergen Reactions  . Codeine      Medication List    STOP taking these  medications       lisinopril-hydrochlorothiazide 10-12.5 MG per tablet  Commonly known as:  PRINZIDE,ZESTORETIC     SMZ-TMP DS 800-160 MG per tablet  Generic drug:  sulfamethoxazole-trimethoprim      TAKE these medications       amiodarone 200 MG tablet  Commonly known as:  PACERONE  Take 1 tablet (200 mg total) by mouth 2 (two) times daily.     amoxicillin-clavulanate 875-125 MG per tablet  Commonly known as:  AUGMENTIN  Take 1 tablet by mouth every 12 (twelve) hours.     atorvastatin 40 MG tablet  Commonly known as:  LIPITOR  Take 1 tablet (40 mg total) by mouth daily.     carvedilol 6.25 MG tablet  Commonly known as:  COREG  Take 1 tablet (6.25 mg total) by mouth 2 (two) times daily with a meal.     FLUoxetine 40 MG capsule  Commonly known as:  PROZAC  Take 40 mg by mouth daily.     furosemide 20 MG tablet  Commonly known as:  LASIX  Take 1 tablet (20 mg total) by mouth daily.     gabapentin 100 MG capsule  Commonly known as:  NEURONTIN  Take 100 mg by mouth 3 (three) times daily.     omega-3 acid ethyl esters 1 G capsule    Commonly known as:  LOVAZA  Take 2 g by mouth 2 (two) times daily.     omeprazole 20 MG capsule  Commonly known as:  PRILOSEC  Take 20 mg by mouth daily.     oxyCODONE-acetaminophen 5-325 MG per tablet  Commonly known as:  PERCOCET/ROXICET  Take 1 tablet by mouth every 6 (six) hours as needed for pain.     PHILLIPS COLON HEALTH PO  Take 1 tablet by mouth daily.     Rivaroxaban 15 MG Tabs tablet  Commonly known as:  XARELTO  Take 1 tablet (15 mg total) by mouth daily.     tiZANidine 4 MG tablet  Commonly known as:  ZANAFLEX  Take 4 mg by mouth every 6 (six) hours as needed.     traZODone 50 MG tablet  Commonly known as:  DESYREL  Take 50 mg by mouth at bedtime.        Discharge Orders   Future Appointments Provider Department Dept Phone   07/12/2013 1:10 PM Lendon Colonel, NP [facility] [place] [phone]   07/17/2013 10:30 AM Westly Pam [facility] [phone]   08/01/2013 4:00 PM [facility] [phone]   Patient should wear two piece clothing and wear no powder or deodorant. Patient should arrive 15 minutes early.   Future Orders Complete By Expires   (HEART FAILURE PATIENTS) Call MD:  Anytime you have any of the following symptoms: 1) 3 pound weight gain in 24 hours or 5 pounds in 1 week 2) shortness of breath, with or without a dry hacking cough 3) swelling in the hands, feet or stomach 4) if you have to sleep on extra pillows at night in order to breathe.  As directed    Diet - low sodium heart healthy  As directed    Diet Carb Modified  As directed    Increase activity slowly  As directed      Follow-up Information   Follow up with Jory Sims, NP On 07/12/2013. (at 1:10 pm)    Specialty:  Nurse Practitioner   Contact information:   [phone]  [place] [address] [phone]       BRING ALL MEDICATIONS WITH YOU TO FOLLOW UP APPOINTMENTS  Time spent with  patient to include physician time: 36 min Signed: Rosaria Ferries, PA-C 07/06/2013, 1:08 PM Co-Sign MD

## 2013-07-06 NOTE — Progress Notes (Addendum)
SUBJECTIVE: No chest pain.  Weak.  Walked with PT   . amiodarone  200 mg Oral BID  . amoxicillin-clavulanate  1 tablet Oral Q12H  . atorvastatin  40 mg Oral q1800  . carvedilol  3.125 mg Oral BID WC  . enoxaparin (LOVENOX) injection  55 mg Subcutaneous Q24H  . FLUoxetine  40 mg Oral Daily  . insulin aspart  0-15 Units Subcutaneous TID WC  . pantoprazole  40 mg Oral Daily  . sodium chloride  3 mL Intravenous Q12H    PHYSICAL EXAM Filed Vitals:   07/05/13 1401 07/05/13 1713 07/05/13 1926 07/06/13 0441  BP: 121/65 130/78 125/82 142/81  Pulse: 73 74 73 67  Temp: 97.7 F (36.5 C)  98.1 F (36.7 C) 97.3 F (36.3 C)  TempSrc: Oral   Oral  Resp: 20  20 18   Height:      Weight:      SpO2: 97%  95% 96%   General:  Acutely ill, no distress Lungs:Decreased breath sounds right greater than left. Heart:  RRR Abdomen:  Positive bowel sounds, no rebound no guarding Extremities:  No edema   LABS:  Results for orders placed during the hospital encounter of 07/02/13 (from the past 24 hour(s))  GLUCOSE, CAPILLARY     Status: Abnormal   Collection Time    07/05/13 11:20 AM      Result Value Range   Glucose-Capillary 145 (*) 70 - 99 mg/dL   Comment 1 Documented in Chart    GLUCOSE, CAPILLARY     Status: Abnormal   Collection Time    07/05/13  4:59 PM      Result Value Range   Glucose-Capillary 116 (*) 70 - 99 mg/dL  GLUCOSE, CAPILLARY     Status: Abnormal   Collection Time    07/05/13  7:29 PM      Result Value Range   Glucose-Capillary 163 (*) 70 - 99 mg/dL  BASIC METABOLIC PANEL     Status: Abnormal   Collection Time    07/06/13  4:20 AM      Result Value Range   Sodium 136  135 - 145 mEq/L   Potassium 3.8  3.5 - 5.1 mEq/L   Chloride 99  96 - 112 mEq/L   CO2 28  19 - 32 mEq/L   Glucose, Bld 154 (*) 70 - 99 mg/dL   BUN 24 (*) 6 - 23 mg/dL   Creatinine, Ser 1.21 (*) 0.50 - 1.10 mg/dL   Calcium 9.1  8.4 - 10.5 mg/dL   GFR calc non Af Amer 43 (*) >90 mL/min   GFR calc  Af Amer 50 (*) >90 mL/min  GLUCOSE, CAPILLARY     Status: Abnormal   Collection Time    07/06/13  7:38 AM      Result Value Range   Glucose-Capillary 161 (*) 70 - 99 mg/dL   Comment 1 Documented in Chart     Comment 2 Notify RN      Intake/Output Summary (Last 24 hours) at 07/06/13 1101 Last data filed at 07/06/13 0931  Gross per 24 hour  Intake    840 ml  Output      0 ml  Net    840 ml     ASSESSMENT AND PLAN:  NSTEMI:    Takatsubo.  Coreg started yesterday.   Hypertensive, we will uptitrate coreg to 6.25 BID.  However, still holding on ARB with mild CKD.  Follow up echo in 4-6  weeks to assess LV function.  PNEUMONIA:  Continue antibiotics per CCM.   Holding on Xarelto until we know that there are no further invasive procedures planned.  Start 15 mg daily if no procedures. Stop Lovenox at that point.   ATRIAL FIB:  NSR.  On amiodarone.   Otherwise as above.   CKD:  Creat up slightly.  Follow  PROLONGED QT:   No symptomatic arrhythmias.    We will discharge and follow up in the clinic in 1-2 weeks with BMP (follow up Crea for initiation of ACEI) with an echo in 4 -6 weeks  Ena Dawley, H 07/06/2013 11:01 AM

## 2013-07-06 NOTE — Progress Notes (Signed)
Pts daughter had concerns of need for o2. We ambulated patient the length of the hallway, sats never below 93% on RA. Pt not in any distress. Family satisfied

## 2013-07-10 ENCOUNTER — Telehealth: Payer: Self-pay | Admitting: *Deleted

## 2013-07-10 NOTE — Telephone Encounter (Signed)
error 

## 2013-07-10 NOTE — Telephone Encounter (Signed)
.  left message to have patient return my call.  

## 2013-07-12 ENCOUNTER — Encounter: Payer: Self-pay | Admitting: Adult Health

## 2013-07-12 ENCOUNTER — Ambulatory Visit (INDEPENDENT_AMBULATORY_CARE_PROVIDER_SITE_OTHER): Payer: Medicare Other | Admitting: Adult Health

## 2013-07-12 ENCOUNTER — Ambulatory Visit (HOSPITAL_COMMUNITY)
Admission: RE | Admit: 2013-07-12 | Discharge: 2013-07-12 | Disposition: A | Discharge status: Home / Self Care | Payer: Medicare Other | Source: Ambulatory Visit | Attending: Adult Health | Admitting: Adult Health

## 2013-07-12 VITALS — BP 113/65 | HR 67 | Ht 62.0 in | Wt 114.0 lb

## 2013-07-12 DIAGNOSIS — I5021 Acute systolic (congestive) heart failure: Secondary | ICD-10-CM

## 2013-07-12 DIAGNOSIS — I429 Cardiomyopathy, unspecified: Secondary | ICD-10-CM

## 2013-07-12 DIAGNOSIS — I10 Essential (primary) hypertension: Secondary | ICD-10-CM

## 2013-07-12 DIAGNOSIS — Z862 Personal history of diseases of the blood and blood-forming organs and certain disorders involving the immune mechanism: Secondary | ICD-10-CM

## 2013-07-12 DIAGNOSIS — Z8701 Personal history of pneumonia (recurrent): Secondary | ICD-10-CM | POA: Insufficient documentation

## 2013-07-12 DIAGNOSIS — I509 Heart failure, unspecified: Secondary | ICD-10-CM

## 2013-07-12 DIAGNOSIS — N200 Calculus of kidney: Secondary | ICD-10-CM

## 2013-07-12 DIAGNOSIS — J90 Pleural effusion, not elsewhere classified: Secondary | ICD-10-CM | POA: Insufficient documentation

## 2013-07-12 DIAGNOSIS — I428 Other cardiomyopathies: Secondary | ICD-10-CM

## 2013-07-12 LAB — BASIC METABOLIC PANEL
BUN: 30 mg/dL — ABNORMAL HIGH (ref 6–23)
CO2: 27 mEq/L (ref 19–32)
Chloride: 100 mEq/L (ref 96–112)
Creat: 1.6 mg/dL — ABNORMAL HIGH (ref 0.50–1.10)
Glucose, Bld: 127 mg/dL — ABNORMAL HIGH (ref 70–99)
Sodium: 137 mEq/L (ref 135–145)

## 2013-07-12 NOTE — Patient Instructions (Addendum)
  Your physician recommends that you schedule a follow-up appointment in: 1 month with Dr Burt Knack  A chest x-ray takes a picture of the organs and structures inside the chest, including the heart, lungs, and blood vessels. This test can show several things, including, whether the heart is enlarges; whether fluid is building up in the lungs; and whether pacemaker / defibrillator leads are still in place.  Your physician recommends that you return for lab work today. CBC, BMET, UA

## 2013-07-12 NOTE — Telephone Encounter (Signed)
.  left message to have patient return my call.  

## 2013-07-12 NOTE — Assessment & Plan Note (Signed)
No recurrent chest pain. She is deconditoned and is easily fatigued. Will continue PT.

## 2013-07-12 NOTE — Assessment & Plan Note (Signed)
There is no significant changes in her breath sounds, but crackles are noted in the right base. I will check CXR for evaluation of status. Incentive spirometer is provided with instructions for use.

## 2013-07-12 NOTE — Progress Notes (Signed)
HPI: [name] is a 72 year old patient we are seeing posthospitalization with no history of CAD, but was admitted with recurrent chest pain and shortness of breath to [facility], transferred to [place] on 07/02/2013 in the setting of non-ST elevation MI, Takotsubo cardiomyopathy. She had a cardiac catheterization completed demonstrating no coronary artery disease. A 2-D echocardiogram demonstrated significant systolic dysfunction with an EF of 20-25%. She was found to have renal insufficiency, and therefore ACE inhibitor was not started. He did tolerate a low-dose beta blocker. She was also found to be in atrial fibrillation and placed on full fibrillation. Patient was started on Xarelto 15 mg daily, carvedilol 6.25 mg twice a day, amiodarone 2 mg daily. Patient was also treated for community-acquired pneumonia. She is also having outpatient physical therapy at her home   She remains frail but is feeling better since being home. She complains of RLL pain in her back with deep inspiration. She denies recurrent chest pain or significant shortness of breath.  She has sustained a bruise under her chin since starting Xarelto.  Allergies  Allergen Reactions  . Codeine     Current Outpatient Prescriptions  Medication Sig Dispense Refill  . amiodarone (PACERONE) 200 MG tablet Take 1 tablet (200 mg total) by mouth 2 (two) times daily.  60 tablet  11  . amoxicillin-clavulanate (AUGMENTIN) 875-125 MG per tablet Take 1 tablet by mouth every 12 (twelve) hours.  10 tablet  0  . atorvastatin (LIPITOR) 40 MG tablet Take 1 tablet (40 mg total) by mouth daily.  30 tablet  11  . carvedilol (COREG) 6.25 MG tablet Take 1 tablet (6.25 mg total) by mouth 2 (two) times daily with a meal.  60 tablet  11  . FLUoxetine (PROZAC) 40 MG capsule Take 40 mg by mouth daily.      . furosemide (LASIX) 20 MG tablet Take 1 tablet (20 mg total) by mouth daily.  30 tablet  11  . gabapentin (NEURONTIN) 100 MG  capsule Take 100 mg by mouth 3 (three) times daily.      Marland Kitchen omega-3 acid ethyl esters (LOVAZA) 1 G capsule Take 2 g by mouth 2 (two) times daily.      Marland Kitchen omeprazole (PRILOSEC) 20 MG capsule Take 20 mg by mouth daily.      Marland Kitchen oxyCODONE-acetaminophen (PERCOCET/ROXICET) 5-325 MG per tablet Take 1 tablet by mouth every 6 (six) hours as needed for pain.      . Probiotic Product (PHILLIPS COLON HEALTH PO) Take 1 tablet by mouth daily.      . Rivaroxaban (XARELTO) 15 MG TABS tablet Take 1 tablet (15 mg total) by mouth daily.  30 tablet  11  . tiZANidine (ZANAFLEX) 4 MG tablet Take 4 mg by mouth every 6 (six) hours as needed.      . traZODone (DESYREL) 50 MG tablet Take 50 mg by mouth at bedtime.       No current facility-administered medications for this visit.    Past Medical History  Diagnosis Date  . GERD (gastroesophageal reflux disease)   . Hypercholesterolemia   . Diabetes mellitus   . Hypertension   . Bursitis   . FHx: migraine headaches   . Renal lithiasis   . 10 year risk of MI or stroke 7.5% or greater     Past Surgical History  Procedure Laterality Date  . Partial hysterectomy    . Hip surgery      right  . Cholecystectomy    .  Esophagogastroduodenoscopy  06/12/2007    [id number] esophagus/patulous EG junction, moderate sized  hiatal hernia, otherwise normal stomach, D1, D2  . Colonoscopy  06/15/2005    RMR: Minimal internal hemorrhoids. Otherwise normal rectum/Normal colon  . Colonoscopy    12/14/2001    RMR: Internal hemorrhage, otherwise, normal rectum/ Polyps at 30 and 35 cm resected; the remainder of colonic mucosa appeared normal    ROS: Review of systems complete and found to be negative unless listed above  PHYSICAL EXAM BP 113/65  Pulse 67  Ht 5\' 2"  (1.575 m)  Wt 114 lb (51.71 kg)  BMI 20.85 kg/m2  General: Well developed, well nourished, in no acute distress Head: Eyes PERRLA, No xanthomas.   Normal cephalic and atramatic  Lungs:  Some crackles in the right  base. No wheezes or coughing. Heart: HRRR S1 S2, without MRG.  Pulses are 2+ & equal.            No carotid bruit. No JVD.  No abdominal bruits. No femoral bruits. Abdomen: Bowel sounds are positive, abdomen soft and non-tender without masses or                  Hernia's noted. Msk:  Back normal, normal gait. Normal strength and tone for age. Extremities: No clubbing, cyanosis or edema.  DP +1 Neuro: Alert and oriented X 3. Psych:  Good affect, responds appropriately    ASSESSMENT AND PLAN

## 2013-07-12 NOTE — Assessment & Plan Note (Signed)
Will check CBC today.  

## 2013-07-12 NOTE — Assessment & Plan Note (Signed)
BP is slightly lower than when she was in hospital. Will check CBC.

## 2013-07-12 NOTE — Progress Notes (Deleted)
Name: [name]    DOB: 02/25/1941  Age: 72 y.o.  MR#: [id number]       PCP:  Kern Alberta      Insurance: Payor: [place] / Plan: BLUE MEDICARE / Product Type: *No Product type* /   CC:   No chief complaint on file. PT COMPLAINS OF INDEGESTION  VS Filed Vitals:   07/12/13 1323  BP: 113/65  Pulse: 67  Height: 5\' 2"  (1.575 m)  Weight: 114 lb (51.71 kg)    Weights Current Weight  07/12/13 114 lb (51.71 kg)  07/02/13 123 lb 14.4 oz (56.2 kg)  07/02/13 123 lb 14.4 oz (56.2 kg)    Blood Pressure  BP Readings from Last 3 Encounters:  07/12/13 113/65  07/06/13 125/63  07/06/13 125/63     Admit date:  (Not on file) Last encounter with RMR:  Visit date not found   Allergy Codeine  Current Outpatient Prescriptions  Medication Sig Dispense Refill  . amiodarone (PACERONE) 200 MG tablet Take 1 tablet (200 mg total) by mouth 2 (two) times daily.  60 tablet  11  . amoxicillin-clavulanate (AUGMENTIN) 875-125 MG per tablet Take 1 tablet by mouth every 12 (twelve) hours.  10 tablet  0  . atorvastatin (LIPITOR) 40 MG tablet Take 1 tablet (40 mg total) by mouth daily.  30 tablet  11  . carvedilol (COREG) 6.25 MG tablet Take 1 tablet (6.25 mg total) by mouth 2 (two) times daily with a meal.  60 tablet  11  . FLUoxetine (PROZAC) 40 MG capsule Take 40 mg by mouth daily.      . furosemide (LASIX) 20 MG tablet Take 1 tablet (20 mg total) by mouth daily.  30 tablet  11  . gabapentin (NEURONTIN) 100 MG capsule Take 100 mg by mouth 3 (three) times daily.      Marland Kitchen omega-3 acid ethyl esters (LOVAZA) 1 G capsule Take 2 g by mouth 2 (two) times daily.      Marland Kitchen omeprazole (PRILOSEC) 20 MG capsule Take 20 mg by mouth daily.      Marland Kitchen oxyCODONE-acetaminophen (PERCOCET/ROXICET) 5-325 MG per tablet Take 1 tablet by mouth every 6 (six) hours as needed for pain.      . Probiotic Product (PHILLIPS COLON HEALTH PO) Take 1 tablet by mouth daily.      . Rivaroxaban (XARELTO) 15 MG  TABS tablet Take 1 tablet (15 mg total) by mouth daily.  30 tablet  11  . tiZANidine (ZANAFLEX) 4 MG tablet Take 4 mg by mouth every 6 (six) hours as needed.      . traZODone (DESYREL) 50 MG tablet Take 50 mg by mouth at bedtime.       No current facility-administered medications for this visit.    Discontinued Meds:   There are no discontinued medications.  Patient Active Problem List   Diagnosis Date Noted  . Physical deconditioning 07/06/2013  . NSTEMI (non-ST elevated myocardial infarction) 07/02/2013  . Community acquired pneumonia 07/02/2013  . Sepsis(995.91) 07/02/2013  . Acute respiratory failure 07/02/2013  . Acute systolic CHF (congestive heart failure) 07/02/2013  . 10 year risk of MI or stroke 7.5% or greater   . HEMOCCULT POSITIVE STOOL 05/15/2009  . ANEMIA, IRON DEFICIENCY, HX OF 05/15/2009  . DM 05/14/2009  . HYPERCHOLESTEROLEMIA 05/14/2009  . MIGRAINE HEADACHE 05/14/2009  . HYPERTENSION 05/14/2009  . GERD 05/14/2009  . CHOLELITHIASIS 05/14/2009  . NEPHROLITHIASIS 05/14/2009  . BURSITIS 05/14/2009  LABS    Component Value Date/Time   NA 136 07/06/2013 0420   NA 135 07/04/2013 0510   NA 130* 07/03/2013 0510   K 3.8 07/06/2013 0420   K 3.7 07/04/2013 0510   K 3.2* 07/03/2013 0510   CL 99 07/06/2013 0420   CL 94* 07/04/2013 0510   CL 88* 07/03/2013 0510   CO2 28 07/06/2013 0420   CO2 32 07/04/2013 0510   CO2 29 07/03/2013 0510   GLUCOSE 154* 07/06/2013 0420   GLUCOSE 163* 07/04/2013 0510   GLUCOSE 147* 07/03/2013 0510   BUN 24* 07/06/2013 0420   BUN 27* 07/04/2013 0510   BUN 20 07/03/2013 0510   CREATININE 1.21* 07/06/2013 0420   CREATININE 1.47* 07/04/2013 0510   CREATININE 1.33* 07/03/2013 0510   CALCIUM 9.1 07/06/2013 0420   CALCIUM 8.5 07/04/2013 0510   CALCIUM 8.6 07/03/2013 0510   GFRNONAA 43* 07/06/2013 0420   GFRNONAA 34* 07/04/2013 0510   GFRNONAA 39* 07/03/2013 0510   GFRAA 50* 07/06/2013 0420   GFRAA 40* 07/04/2013 0510   GFRAA 45* 07/03/2013 0510   CMP      Component Value Date/Time   NA 136 07/06/2013 0420   K 3.8 07/06/2013 0420   CL 99 07/06/2013 0420   CO2 28 07/06/2013 0420   GLUCOSE 154* 07/06/2013 0420   BUN 24* 07/06/2013 0420   CREATININE 1.21* 07/06/2013 0420   CALCIUM 9.1 07/06/2013 0420   PROT 6.4 07/02/2013 0224   ALBUMIN 2.2* 07/02/2013 0224   AST 27 07/02/2013 0224   ALT 38* 07/02/2013 0224   ALKPHOS 165* 07/02/2013 0224   BILITOT 0.5 07/02/2013 0224   GFRNONAA 43* 07/06/2013 0420   GFRAA 50* 07/06/2013 0420       Component Value Date/Time   WBC 8.3 07/05/2013 0556   WBC 9.7 07/04/2013 0510   WBC 12.3* 07/03/2013 0510   HGB 11.5* 07/05/2013 0556   HGB 11.3* 07/04/2013 0510   HGB 12.5 07/03/2013 0510   HCT 35.2* 07/05/2013 0556   HCT 34.3* 07/04/2013 0510   HCT 36.3 07/03/2013 0510   MCV 88.4 07/05/2013 0556   MCV 86.8 07/04/2013 0510   MCV 85.4 07/03/2013 0510    Lipid Panel  No results found for this basename: chol, trig, hdl, cholhdl, vldl, ldlcalc    ABG    Component Value Date/Time   PHART 7.335* 07/02/2013 0210   PCO2ART 44.4 07/02/2013 0210   PO2ART 54.0* 07/02/2013 0210   HCO3 23.7 07/02/2013 0210   TCO2 25 07/02/2013 0210   ACIDBASEDEF 2.0 07/02/2013 0210   O2SAT 85.0 07/02/2013 0210     No results found for this basename: TSH   BNP (last 3 results)  Recent Labs  07/02/13 0224  PROBNP 30181.0*   Cardiac Panel (last 3 results) No results found for this basename: CKTOTAL, CKMB, TROPONINI, RELINDX,  in the last 72 hours  Iron/TIBC/Ferritin No results found for this basename: iron, tibc, ferritin     EKG Orders placed during the hospital encounter of 07/02/13  . EKG 12-LEAD  . EKG 12-LEAD  . EKG 12-LEAD  . EKG 12-LEAD  . EKG 12-LEAD  . EKG 12-LEAD  . EKG 12-LEAD  . EKG 12-LEAD  . EKG 12-LEAD  . EKG 12-LEAD  . EKG     Prior Assessment and Plan Problem List as of 07/12/2013   DM   HYPERCHOLESTEROLEMIA   MIGRAINE HEADACHE   HYPERTENSION   GERD   CHOLELITHIASIS   HEMOCCULT POSITIVE STOOL  NEPHROLITHIASIS   BURSITIS   ANEMIA, IRON DEFICIENCY, HX OF   NSTEMI (non-ST elevated myocardial infarction)   Community acquired pneumonia   Sepsis(995.91)   Acute respiratory failure   Acute systolic CHF (congestive heart failure)   10 year risk of MI or stroke 7.5% or greater   Physical deconditioning       Imaging: Dg Chest Bilateral Decubitus  07/05/2013   CLINICAL DATA:  Pneumonia and pleural effusion.  EXAM: CHEST - BILATERAL DECUBITUS VIEW  COMPARISON:  07/04/2013  FINDINGS: Bilateral decubitus views demonstrate a layering small to moderate sized right pleural effusion and a small left pleural effusion. No pneumothorax is identified.  IMPRESSION: Bilateral layering pleural effusions, right greater than left.   Electronically Signed   By: Aletta Edouard M.D.   On: 07/05/2013 08:21   Dg Chest Port 1 View  07/04/2013   CLINICAL DATA:  Shortness of breath. Evaluate for pneumonia or pulmonary edema.  EXAM: PORTABLE CHEST - 1 VIEW  COMPARISON:  CHEST x-ray 07/03/2013.  FINDINGS: Persistent consolidation in the right lower lobe, concerning for pneumonia. Small right pleural effusion. Left lung is clear. No evidence of pulmonary edema. Heart size is normal. Mediastinal contours are unremarkable. Atherosclerosis in the thoracic aorta.  IMPRESSION: 1. Persistent right lower lobe pneumonia with small parapneumonic pleural effusion. 2. Atherosclerosis.   Electronically Signed   By: Vinnie Langton M.D.   On: 07/04/2013 06:38   Dg Chest Port 1 View  07/03/2013   CLINICAL DATA:  Shortness of breath  EXAM: PORTABLE CHEST - 1 VIEW  COMPARISON:  07/02/2013  FINDINGS: The cardiac shadow is stable. The diffuse vascular congestion and central edema has resolved in the interval. Persistent right basilar infiltrate is noted.  IMPRESSION: Improvement in vascular congestion although a persistent right basilar infiltrate is seen.   Electronically Signed   By: Inez Catalina M.D.   On: 07/03/2013 07:08   Dg  Chest Port 1 View  07/02/2013   CLINICAL DATA:  Hypoxia.  EXAM: PORTABLE CHEST - 1 VIEW  COMPARISON:  CHEST x-ray 07/01/2013.  FINDINGS: Diffuse interstitial prominence and patchy predominantly perihilar airspace disease, concerning for a background of moderate pulmonary edema. There is more focal airspace consolidation in the periphery of the right lung at the base, which may suggest superimposed infection or sequela of aspiration. Probable small a moderate right-sided pleural effusion. Heart size appears borderline enlarged. The patient is rotated to the right on today's exam, resulting in distortion of the mediastinal contours and reduced diagnostic sensitivity and specificity for mediastinal pathology.  IMPRESSION: 1. The appearance of the chest is similar to the prior study. There appears to be a background of moderate pulmonary edema, but there is a more focal airspace consolidation at the right base concerning for superimposed infection or sequela of aspiration, likely with moderate right-sided pleural effusion.   Electronically Signed   By: Vinnie Langton M.D.   On: 07/02/2013 02:23

## 2013-07-12 NOTE — Assessment & Plan Note (Signed)
She may be having flank pain. I will check a U/A

## 2013-07-13 ENCOUNTER — Telehealth: Payer: Self-pay

## 2013-07-13 LAB — URINALYSIS
Bilirubin Urine: NEGATIVE
Glucose, UA: NEGATIVE mg/dL
Hgb urine dipstick: NEGATIVE
Protein, ur: NEGATIVE mg/dL
Specific Gravity, Urine: 1.016 (ref 1.005–1.030)
Urobilinogen, UA: 0.2 mg/dL (ref 0.0–1.0)
pH: 5 (ref 5.0–8.0)

## 2013-07-13 LAB — CBC
HCT: 35.7 % — ABNORMAL LOW (ref 36.0–46.0)
MCHC: 31.4 g/dL (ref 30.0–36.0)
MCV: 89.5 fL (ref 78.0–100.0)
Platelets: 375 10*3/uL (ref 150–400)
RBC: 3.99 MIL/uL (ref 3.87–5.11)
RDW: 14.9 % (ref 11.5–15.5)
WBC: 10.8 10*3/uL — ABNORMAL HIGH (ref 4.0–10.5)

## 2013-07-13 MED ORDER — FUROSEMIDE 20 MG PO TABS: ORAL_TABLET | ORAL | Status: DC

## 2013-07-13 NOTE — Addendum Note (Signed)
Addended by: Karlyne Greenspan C on: 07/13/2013 10:05 AM   Modules accepted: Orders

## 2013-07-13 NOTE — Telephone Encounter (Signed)
Noted pt concern addressed with HD LPN via result note

## 2013-07-13 NOTE — Telephone Encounter (Signed)
Patient returning call, she stated she received message to reduce lasix and take when swelling.

## 2013-07-16 ENCOUNTER — Telehealth: Payer: Self-pay | Admitting: *Deleted

## 2013-07-16 NOTE — Telephone Encounter (Signed)
Called pt back to advise the providers have left for the day and her question will be answered tomorrow, however if the pt has any worsening sxs to please be evaluated at the ED, the pt daughter advised that she really feels that will not happen because the pt is feeling fine and no other sxs noted, pt daughter understood

## 2013-07-16 NOTE — Telephone Encounter (Signed)
Noted incoming call from pt daughter to clarify the pt was to STOP taking Lasix unless she has swelling, pt daughter was educated per recent labs the pt is to take Lasix PRN for swelling, concern noted for when the weight gain started 111.4 on Saturday, 112.4 on Sunday and today 113.4, pt denies chest pain/SOB/swelling not seen, please advise per swelling not located does the pt need to take for this amount of weight gain?

## 2013-07-17 ENCOUNTER — Encounter: Payer: Self-pay | Admitting: Gastroenterology

## 2013-07-17 ENCOUNTER — Ambulatory Visit (INDEPENDENT_AMBULATORY_CARE_PROVIDER_SITE_OTHER): Payer: Medicare Other | Admitting: Gastroenterology

## 2013-07-17 VITALS — BP 101/62 | HR 64 | Temp 96.4°F | Ht 62.0 in | Wt 114.2 lb

## 2013-07-17 DIAGNOSIS — K219 Gastro-esophageal reflux disease without esophagitis: Secondary | ICD-10-CM

## 2013-07-17 DIAGNOSIS — Z862 Personal history of diseases of the blood and blood-forming organs and certain disorders involving the immune mechanism: Secondary | ICD-10-CM

## 2013-07-17 NOTE — Telephone Encounter (Signed)
She is to take lasix for fluid retention and swelling only. Wt gain of greater than 5 lbs should be reported to [place] for further instructions.

## 2013-07-17 NOTE — Progress Notes (Signed)
Primary Care Physician:  Kern Alberta  Primary Gastroenterologist:  Garfield Cornea, MD   Chief Complaint  Patient presents with  . Anemia    HPI:  [name] is a 72 y.o. female here for further evaluation of IDA. Recent admission at [facility] with community acquired PNA/sepsis. During hospitalization she developed chest pain and elevated troponin. She was transferred to [facility]. She was diagnosed with NSTEMI-Takotsubo cardiomyopathy. Cardiac cath showed no CAD. She developed A.Fib during cath and now on Xarelto.  Prior to her transfer from [facility] to [facility], patient states she received 2 units of prbcs.  She denies melena, brbpr. No constipation. BM 3-4 times per day. Stools soft. No nocturnal BMs. No abdominal pain. Has had a lot of indigestion in the hospital. Increased omeprazole to BID. Feels better with increased dose. No dysphagia. No vomiting.      Current Outpatient Prescriptions  Medication Sig Dispense Refill  . amiodarone (PACERONE) 200 MG tablet Take 1 tablet (200 mg total) by mouth 2 (two) times daily.  60 tablet  11  . atorvastatin (LIPITOR) 40 MG tablet Take 1 tablet (40 mg total) by mouth daily.  30 tablet  11  . carvedilol (COREG) 6.25 MG tablet Take 1 tablet (6.25 mg total) by mouth 2 (two) times daily with a meal.  60 tablet  11  . FLUoxetine (PROZAC) 40 MG capsule Take 40 mg by mouth daily.      . furosemide (LASIX) 20 MG tablet Take 1 tablet (20 mg) as needed for swelling.  30 tablet  11  . omega-3 acid ethyl esters (LOVAZA) 1 G capsule Take 2 g by mouth 2 (two) times daily.      Marland Kitchen omeprazole (PRILOSEC) 20 MG capsule Take 20 mg by mouth 2 (two) times daily before a meal.       . oxyCODONE-acetaminophen (PERCOCET/ROXICET) 5-325 MG per tablet Take 1 tablet by mouth every 6 (six) hours as needed for pain.      . Probiotic Product (PHILLIPS COLON HEALTH PO) Take 1 tablet by mouth daily.      . Rivaroxaban (XARELTO) 15 MG TABS tablet Take 1 tablet (15 mg total) by mouth daily.   30 tablet  11  . tiZANidine (ZANAFLEX) 4 MG tablet Take 4 mg by mouth every 6 (six) hours as needed.      . traZODone (DESYREL) 50 MG tablet Take 50 mg by mouth at bedtime.       No current facility-administered medications for this visit.    Allergies as of 07/17/2013 - Review Complete 07/17/2013  Allergen Reaction Noted  . Codeine  07/02/2013    Past Medical History  Diagnosis Date  . GERD (gastroesophageal reflux disease)   . Hypercholesterolemia   . Diabetes mellitus   . Hypertension   . Bursitis   . FHx: migraine headaches   . Renal lithiasis   . 10 year risk of MI or stroke 7.5% or greater   . 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 Partial hysterectomy    . Hip surgery      right  . Cholecystectomy    . Esophagogastroduodenoscopy  06/12/2007    [id number] esophagus/patulous EG junction, moderate sized  hiatal hernia, otherwise normal stomach, D1, D2  . Colonoscopy  06/15/2005    RMR: Minimal internal hemorrhoids. Otherwise normal rectum/Normal colon  . Colonoscopy    12/14/2001    RMR: Internal hemorrhage, otherwise, normal rectum/ Polyps at 30 and  35 cm resected; the remainder of colonic mucosa appeared normal  . Colonoscopy  05/2009    RMR: minimal hemorrhoids, sigmoid hyperplastic polyp    Family History  Problem Relation Age of Onset  . Pancreatic cancer Father 58  . Colon cancer Mother     late 29s    History   Social History  . Marital Status: Divorced    Spouse Name: N/A    Number of Children: 3  . Years of Education: N/A   Occupational History  . retired from [employer]    Social History Main Topics  . Smoking status: Former Smoker -- 1.00 packs/day for 43 years    Types: Cigarettes    Start date: 08/31/1959    Quit date: 08/30/2002  . Smokeless tobacco: Never Used     Comment: Quit x 10 years  . Alcohol Use: No  . Drug Use: No  . Sexual Activity: Not on file   Other Topics Concern  .  Not on file   Social History Narrative  . No narrative on file      ROS:  General: + for anorexia, weakness, No weight loss, fever, chills. Eyes: Negative for vision changes.  ENT: Negative for hoarseness, difficulty swallowing , nasal congestion. CV: Negative for chest pain, angina, palpitations. + dyspnea on exertion. No peripheral edema.  Respiratory: Negative for dyspnea at rest, + dyspnea on exertion. No cough, sputum, wheezing.  GI: See history of present illness. GU:  Negative for dysuria, hematuria, urinary incontinence, urinary frequency, nocturnal urination.  MS: Negative for joint pain, low back pain.  Derm: Negative for rash or itching.  Neuro: Negative for weakness, abnormal sensation, seizure, frequent headaches, memory loss, confusion.  Psych: Negative for anxiety, depression, suicidal ideation, hallucinations.  Endo: Negative for unusual weight change.  Heme: Negative for bruising or bleeding. Allergy: Negative for rash or hives.    Physical Examination:  BP 101/62  Pulse 64  Temp(Src) 96.4 F (35.8 C) (Oral)  Ht 5\' 2"  (1.575 m)  Wt 114 lb 3.2 oz (51.801 kg)  BMI 20.88 kg/m2   General: Well-nourished, well-developed in no acute distress.  Head: Normocephalic, atraumatic.   Eyes: Conjunctiva pale, no icterus. Mouth: Oropharyngeal mucosa moist and pink , no lesions erythema or exudate.bruising under chin Neck: Supple without thyromegaly, masses, or lymphadenopathy.  Lungs: Clear to auscultation bilaterally.  Heart: Regular rate and rhythm, no murmurs rubs or gallops.  Abdomen: Bowel sounds are normal, nontender, nondistended, no hepatosplenomegaly or masses, no abdominal bruits or    hernia , no rebound or guarding.   Rectal: not performed Extremities: No lower extremity edema. No clubbing or deformities.  Neuro: Alert and oriented x 4 , grossly normal neurologically.  Skin: Warm and dry, no rash or jaundice.   Psych: Alert and cooperative, normal mood and  affect.  Labs: Lab Results  Component Value Date   WBC 10.8* 07/12/2013   HGB 11.2* 07/12/2013   HCT 35.7* 07/12/2013   MCV 89.5 07/12/2013   PLT 375 07/12/2013   No results found for this basename: IRON,  TIBC,  FERRITIN   No results found for this basename: VITAMINB12   No results found for this basename: FOLATE   Lab Results  Component Value Date   CREATININE 1.60* 07/12/2013   BUN 30* 07/12/2013   NA 137 07/12/2013   K 4.4 07/12/2013   CL 100 07/12/2013   CO2 27 07/12/2013   Lab Results  Component Value Date   ALT 38* 07/02/2013  AST 27 07/02/2013   ALKPHOS 165* 07/02/2013   BILITOT 0.5 07/02/2013     Labs from PCP dated 05/24/2013 Iron 29 low, iron saturations 9% low, TIBC 333, ferritin 11 low, vitamin B12 217 low normal, folate 9.1, white blood cell count 6800, hemoglobin 8.6, hematocrit 28.3, MCV 88 normal, platelets 245,000, BUN 24, creatinine 1.54 high, total bilirubin 0.2, alkaline phosphatase 67, AST 36, ALT 26, albumin 4.2.   Imaging Studies: Dg Chest 2 View  07/12/2013   CLINICAL DATA:  Recent pneumonia.  EXAM: CHEST  2 VIEW  COMPARISON:  07/04/2013  FINDINGS: Minimal residual right pleural effusion.  No left pleural effusion.  Minimal opacity at the left lateral lung base may reflect atelectasis or minimal residual pneumonia. The lungs are hyperexpanded but otherwise clear. No pneumothorax.  Heart, mediastinum and hila are unremarkable.  Bony thorax is demineralized but intact.  IMPRESSION: Minimal residual right pleural fluid with minimal residual lung opacity at the right lateral lung base which may reflect residual infiltrate or atelectasis.  Lungs otherwise clear.  No left pleural effusion.   Electronically Signed   By: Lajean Manes M.D.   On: 07/12/2013 18:41

## 2013-07-17 NOTE — Assessment & Plan Note (Signed)
H/O IDA with previous work-up on 2010. Recently required transfusion. Hemoccult status unknown. Denies overt GI bleeding. She has had some refractory GERD but improved on PPI BID.   Check ifobt. Discuss with patient's cardiologist regarding clearance for potential colonoscopy/EGD with regards to recent NSTEMI/[place] cardiomyopathy and need to hold Xarelto for procedure.  Discussed with patient, will likely need to hold off on procedures for now unless overt GI bleeding noted.

## 2013-07-17 NOTE — Patient Instructions (Signed)
1. Please collect your stool specimen as soon as possible and return to our office. 2. I will touch base with your cardiologist regarding timing of possible procedures. We will call you with further instructions.

## 2013-07-17 NOTE — Telephone Encounter (Signed)
Spoke to pt to advise results/instructions. Pt understood.  

## 2013-07-18 ENCOUNTER — Ambulatory Visit (INDEPENDENT_AMBULATORY_CARE_PROVIDER_SITE_OTHER): Payer: Medicare Other | Admitting: Gastroenterology

## 2013-07-18 DIAGNOSIS — D649 Anemia, unspecified: Secondary | ICD-10-CM

## 2013-07-18 NOTE — Progress Notes (Signed)
Cc PCP 

## 2013-07-18 NOTE — Progress Notes (Signed)
Pt returned ifobt and it was positive 

## 2013-07-23 ENCOUNTER — Telehealth: Payer: Self-pay | Admitting: Gastroenterology

## 2013-07-23 DIAGNOSIS — Z862 Personal history of diseases of the blood and blood-forming organs and certain disorders involving the immune mechanism: Secondary | ICD-10-CM

## 2013-07-23 LAB — IRON
ALT: 26 U/L (ref 7–35)
Alkaline Phosphatase: 67 U/L
Creat: 1.54
Folate: 9.1
HCT: 28 %
Iron: 29
Total Bilirubin: 0.2 mg/dL
WBC: 6.8
platelet count: 245

## 2013-07-23 NOTE — Telephone Encounter (Signed)
Message copied by Mahala Menghini on Mon Jul 23, 2013  8:38 AM ------      Message from: Lendon Colonel      Created: Thu Jul 19, 2013 11:06 AM       [name] has a CHA2DS2  score of 6 (Age, hypertension, diabetes, CHF, and female sex) with a stroke risk of 18.2  % annual stroke risk,  And therefore will need Lovenox bridging if [name] is stopped. Would wait for a minimum of 3 months before proceeding with colonoscopy, unless, as you said, it becomes an issue with significant bleeding.         Hope this helps.            ----- Message -----         From: Mahala Menghini, PA-C         Sent: 07/19/2013  10:43 AM           To: Lendon Colonel, NP, Doretha Sou,             [name] came to see [place] today for anemia requiring transfusion during recent hospitalization. She is heme positive and has some refractory GERD but otherwise no gross bleeding. Her PCP sent her for evaluation.              Given her recent NSTEMI/Tokutusoby cardiomyopathy and need to hold Xarelto for EGD/colonoscopy I am requesting your input. I discussed with patient, will likely need to hold off on procedures for now unless overt GI bleeding noted but can you give me some sort of direction when you think she would be stable from cardiac standpoint and when Xarelto could be held or if lovenox bridging necessary.             Thanks for your input!            [name]       ------

## 2013-07-23 NOTE — Progress Notes (Signed)
Reviewed records from [facility] hospitalization October 31 to 07/01/13 for sepsis and acute kidney injury secondary to community-acquired right lower lobe and right middle lobe pneumonia. Developed acute coronary syndrome and transferred to [facility]. History of chronic iron deficiency anemia, upon presentation she had Hgb 7.4 and received 2 units of packed blood cells.

## 2013-07-23 NOTE — Telephone Encounter (Signed)
Please let patient know, I discussed with her cardiologist. They want [place] to wait at least until February (3 months after her heart attack) to proceed with EGD/TCS. She will also need Lovenox bridge. She was occult blood positive, but we will continue to monitor for now given recent cardiac issues.   Let's bring her back in 09/2013 for E30 OV to schedule procedures.  Repeat her CBC at end of 07/2013.  If she develops gross bleeding, melena, she needs to let us know or go to ER.

## 2013-07-24 ENCOUNTER — Other Ambulatory Visit: Payer: Self-pay

## 2013-07-24 DIAGNOSIS — Z862 Personal history of diseases of the blood and blood-forming organs and certain disorders involving the immune mechanism: Secondary | ICD-10-CM

## 2013-07-24 NOTE — Telephone Encounter (Signed)
Pt is aware. Lab order on file. Manuela Schwartz, please nic ov in 09/2013

## 2013-07-25 NOTE — Telephone Encounter (Signed)
Reminder in epic °

## 2013-08-01 ENCOUNTER — Ambulatory Visit: Admit: 2013-08-01 | Discharge: 2013-08-01 | Disposition: A | Discharge status: Home / Self Care | Payer: Medicare Other

## 2013-08-01 DIAGNOSIS — Z1231 Encounter for screening mammogram for malignant neoplasm of breast: Secondary | ICD-10-CM

## 2013-08-01 DIAGNOSIS — Z803 Family history of malignant neoplasm of breast: Secondary | ICD-10-CM

## 2013-09-05 ENCOUNTER — Encounter (INDEPENDENT_AMBULATORY_CARE_PROVIDER_SITE_OTHER): Payer: Self-pay

## 2013-09-05 ENCOUNTER — Ambulatory Visit (INDEPENDENT_AMBULATORY_CARE_PROVIDER_SITE_OTHER): Payer: Medicare Other | Admitting: Cardiovascular Disease

## 2013-09-05 ENCOUNTER — Encounter: Payer: Self-pay | Admitting: Cardiovascular Disease

## 2013-09-05 VITALS — BP 132/70 | HR 67 | Ht 62.0 in | Wt 118.4 lb

## 2013-09-05 DIAGNOSIS — I5022 Chronic systolic (congestive) heart failure: Secondary | ICD-10-CM

## 2013-09-05 LAB — CBC WITH DIFFERENTIAL/PLATELET
BASOS ABS: 0 10*3/uL (ref 0.0–0.1)
BASOS PCT: 0.3 % (ref 0.0–3.0)
Eosinophils Absolute: 0.1 10*3/uL (ref 0.0–0.7)
Eosinophils Relative: 0.9 % (ref 0.0–5.0)
HCT: 27.5 % — ABNORMAL LOW (ref 36.0–46.0)
Hemoglobin: 9.1 g/dL — ABNORMAL LOW (ref 12.0–15.0)
LYMPHS PCT: 19.3 % (ref 12.0–46.0)
Lymphs Abs: 1.7 10*3/uL (ref 0.7–4.0)
MCHC: 33 g/dL (ref 30.0–36.0)
MCV: 89.9 fl (ref 78.0–100.0)
MONOS PCT: 8.1 % (ref 3.0–12.0)
Monocytes Absolute: 0.7 10*3/uL (ref 0.1–1.0)
NEUTROS ABS: 6.3 10*3/uL (ref 1.4–7.7)
Neutrophils Relative %: 71.4 % (ref 43.0–77.0)
Platelets: 194 10*3/uL (ref 150.0–400.0)
RBC: 3.06 Mil/uL — AB (ref 3.87–5.11)
RDW: 15.3 % — ABNORMAL HIGH (ref 11.5–14.6)
WBC: 8.8 10*3/uL (ref 4.5–10.5)

## 2013-09-05 LAB — BASIC METABOLIC PANEL
BUN: 24 mg/dL — AB (ref 6–23)
CHLORIDE: 104 meq/L (ref 96–112)
CO2: 26 meq/L (ref 19–32)
Calcium: 8.8 mg/dL (ref 8.4–10.5)
Creatinine, Ser: 1.5 mg/dL — ABNORMAL HIGH (ref 0.4–1.2)
GFR: 36.97 mL/min — ABNORMAL LOW (ref >60.00)
Glucose, Bld: 103 mg/dL — ABNORMAL HIGH (ref 70–99)
Potassium: 4.5 mEq/L (ref 3.5–5.1)
Sodium: 139 mEq/L (ref 135–145)

## 2013-09-05 NOTE — Patient Instructions (Addendum)
Your physician recommends that you have lab work today:  CBC, BMET  Your physician has requested that you have an echocardiogram. Echocardiography is a painless test that uses sound waves to create images of your heart. It provides your doctor with information about the size and shape of your heart and how well your heart's chambers and valves are working. This procedure takes approximately one hour. There are no restrictions for this procedure.  Your physician recommends that you schedule a follow-up appointment in: 2 months with Dr. Burt Knack   Dr. Burt Knack will advise you regarding the colonoscopy after he reviews your echocardiogram

## 2013-09-05 NOTE — Progress Notes (Signed)
HPI:   73 year old woman presenting for followup evaluation. She was hospitalized in November with acute respiratory failure. She was felt to have pneumonia and sepsis. During her critical illness, she sustained a non-ST elevation infarction and was found to have severe LV dysfunction. Her LVEF was 20-25%. She underwent cardiac catheterization that demonstrated no evidence of obstructive CAD. 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 Her hospitalization was complicated by atrial fibrillation and she required amiodarone. .She has been started on anticoagulation with Xarelto, but has had problems with anemia and guaiac positive stools. Colonoscopy has been recommended, but she needs cardiac clearance first.   The patient is doing well. Other than significant fatigue, she has no specific symptoms. She's had no chest pain. Her breathing is improved. She denies leg swelling. She feels like she is making slow progress. She does report dark stools but no obvious blood.  Outpatient Encounter Prescriptions as of 09/05/2013  Medication Sig  . amiodarone (PACERONE) 200 MG tablet Take 1 tablet (200 mg total) by mouth 2 (two) times daily.  Marland Kitchen atorvastatin (LIPITOR) 40 MG tablet Take 1 tablet (40 mg total) by mouth daily.  . carvedilol (COREG) 6.25 MG tablet Take 1 tablet (6.25 mg total) by mouth 2 (two) times daily with a meal.  . FLUoxetine (PROZAC) 40 MG capsule Take 40 mg by mouth daily.  . furosemide (LASIX) 20 MG tablet Take 1 tablet (20 mg) as needed for swelling.  . metFORMIN (GLUCOPHAGE) 500 MG tablet Take by mouth 4 (four) times daily.  . Multiple Vitamins-Minerals (PRESERVISION AREDS PO) Take by mouth.  . omega-3 acid ethyl esters (LOVAZA) 1 G capsule Take 2 g by mouth 2 (two) times daily.  Marland Kitchen omeprazole (PRILOSEC) 20 MG capsule Take 20 mg by mouth 2 (two) times daily before a meal.   . oxyCODONE-acetaminophen (PERCOCET/ROXICET) 5-325 MG per tablet Take 1 tablet by mouth every 6 (six) hours  as needed for pain.  . Probiotic Product (PHILLIPS COLON HEALTH PO) Take 1 tablet by mouth daily.  . Rivaroxaban (XARELTO) 15 MG TABS tablet Take 1 tablet (15 mg total) by mouth daily.  Marland Kitchen tiZANidine (ZANAFLEX) 4 MG tablet Take 4 mg by mouth every 6 (six) hours as needed.  . traZODone (DESYREL) 50 MG tablet Take 50 mg by mouth at bedtime.  . Zinc 50 MG TABS Take by mouth as directed.    Allergies  Allergen Reactions  . Codeine     Past Medical History  Diagnosis Date  . GERD (gastroesophageal reflux disease)   . Hypercholesterolemia   . Diabetes mellitus   . Hypertension   . Bursitis   . FHx: migraine headaches   . Renal lithiasis   . 10 year risk of MI or stroke 7.5% or greater   . 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 Takotsubo's Cardiomyopathy. She has done well and is tolerating carvedilol. She has no signs of CHF on exam. Recommend a repeat echocardiogram to reassess LV function. Would  anticipate significant improvement from her previously severe LV dysfunction during her critical illness.  2. Paroxysmal atrial fibrillation. The patient appears to be maintaining sinus rhythm. She is being treated with the strategy of rhythm control using amiodarone. She's also anticoagulated with Xarelto. I'm concerned about her anemia and guaiac-positive stools. Will recheck a CBC today. She needs colonoscopy, and will plan on tentatively stopping her Xarelto as long as her echo looks okay. May need to consider discontinuation of Xarelto  altogether, especially since her atrial fib occurred in the setting of her acute illness.  3. Hypertension. Blood pressure is controlled on carvedilol.  4. Iron deficiency anemia. Plans for colonoscopy as above. CBC repeated today.  Sherren Mocha 09/05/2013 2:22 PM

## 2013-09-06 ENCOUNTER — Ambulatory Visit: Payer: Medicare Other | Admitting: Cardiovascular Disease

## 2013-09-06 ENCOUNTER — Other Ambulatory Visit: Payer: Self-pay

## 2013-09-10 ENCOUNTER — Encounter: Payer: Self-pay | Admitting: Cardiology

## 2013-09-10 ENCOUNTER — Ambulatory Visit (HOSPITAL_COMMUNITY): Payer: Medicare Other | Attending: Cardiovascular Disease | Admitting: Cardiology

## 2013-09-10 ENCOUNTER — Other Ambulatory Visit (HOSPITAL_COMMUNITY): Payer: Medicare Other

## 2013-09-10 DIAGNOSIS — E785 Hyperlipidemia, unspecified: Secondary | ICD-10-CM | POA: Insufficient documentation

## 2013-09-10 DIAGNOSIS — I5022 Chronic systolic (congestive) heart failure: Secondary | ICD-10-CM

## 2013-09-10 DIAGNOSIS — I509 Heart failure, unspecified: Secondary | ICD-10-CM | POA: Insufficient documentation

## 2013-09-10 DIAGNOSIS — E119 Type 2 diabetes mellitus without complications: Secondary | ICD-10-CM | POA: Insufficient documentation

## 2013-09-10 DIAGNOSIS — I10 Essential (primary) hypertension: Secondary | ICD-10-CM | POA: Insufficient documentation

## 2013-09-10 NOTE — Progress Notes (Signed)
Limited echo performed. 

## 2013-09-10 NOTE — Progress Notes (Deleted)
Echo performed. 

## 2013-09-12 ENCOUNTER — Telehealth: Payer: Self-pay | Admitting: Nurse Practitioner

## 2013-09-12 MED ORDER — ASPIRIN EC 81 MG PO TBEC: 81.0000 mg | DELAYED_RELEASE_TABLET | Freq: Every day | ORAL | Status: DC

## 2013-09-12 NOTE — Telephone Encounter (Signed)
Reviewed echo results with patient who verbalized understanding.  Patient d/c'ed Xarelto on 1/9 following CBC results per MD instruction.  Patient will take ASA 81 mg daily if no contraindication from GI.

## 2013-09-18 ENCOUNTER — Other Ambulatory Visit (HOSPITAL_COMMUNITY): Payer: Medicare Other

## 2013-10-08 ENCOUNTER — Other Ambulatory Visit: Payer: Self-pay | Admitting: Gastroenterology

## 2013-10-08 ENCOUNTER — Ambulatory Visit (INDEPENDENT_AMBULATORY_CARE_PROVIDER_SITE_OTHER): Payer: Medicare Other | Admitting: Gastroenterology

## 2013-10-08 ENCOUNTER — Encounter: Payer: Self-pay | Admitting: Gastroenterology

## 2013-10-08 ENCOUNTER — Other Ambulatory Visit: Payer: Self-pay | Admitting: Internal Medicine

## 2013-10-08 VITALS — BP 152/78 | HR 66 | Temp 97.2°F | Ht 62.0 in | Wt 114.4 lb

## 2013-10-08 DIAGNOSIS — D509 Iron deficiency anemia, unspecified: Secondary | ICD-10-CM

## 2013-10-08 DIAGNOSIS — R195 Other fecal abnormalities: Secondary | ICD-10-CM

## 2013-10-08 DIAGNOSIS — K219 Gastro-esophageal reflux disease without esophagitis: Secondary | ICD-10-CM

## 2013-10-08 DIAGNOSIS — Z862 Personal history of diseases of the blood and blood-forming organs and certain disorders involving the immune mechanism: Secondary | ICD-10-CM

## 2013-10-08 DIAGNOSIS — K921 Melena: Secondary | ICD-10-CM

## 2013-10-08 MED ORDER — PEG-KCL-NACL-NASULF-NA ASC-C 100 G PO SOLR: 1.0000 | ORAL | Status: DC

## 2013-10-08 NOTE — Assessment & Plan Note (Signed)
73 year old lady with iron deficiency anemia, Hemoccult positive stool, questionable melena who presents for colonoscopy with possible upper endoscopy which was delayed last year due to recent NSTEMI/Tokutusoby cardiomyopathy. She was on Xarelto at last OV. Recently came of Xarelto per cardiologist recommendations. She has maintained sinus rhythm and given recent decline in H&H the medication was stopped. She has been advised to start aspirin daily once her procedures are complete and we give the okay.  Recommend colonoscopy with possible upper endoscopy for further evaluation of IDA, heme positive stool, questionable melena. Differential for source of GI bleeding is quite broad at this time. I have discussed the risks, alternatives, benefits with regards to but not limited to the risk of reaction to medication, bleeding, infection, perforation and the patient is agreeable to proceed. Written consent to be obtained.  Augment conscious sedation with Phenergan 12.5 mg IV 30 minutes before the procedure given polypharmacy.  Obtain most recent labs from PCP.

## 2013-10-08 NOTE — Patient Instructions (Signed)
1. Colonoscopy with possible upper endoscopy with Dr. Gala Romney. Please see separate instructions.

## 2013-10-08 NOTE — Progress Notes (Signed)
Primary Care Physician:  Kern Alberta  Primary Gastroenterologist:  Garfield Cornea, MD   Chief Complaint  Patient presents with  . Colonoscopy    HPI:  [name] is a 73 y.o. female here to schedule colonoscopy. We saw her back in November for further evaluation of iron deficiency anemia (documented by labs in September 2014 with a low ferritin, low iron and iron saturations. Vitamin B12 is also low normal.). Colonoscopy had to be delayed because of diagnosis of NSTEMI-Takotsubo cardiomyopathy. She had developed atrial fibrillation during her catheterization and was on Xarelto when I last saw her. She really did not have any GI symptoms outside of indigestion during her hospitalization. This had resolved with increasing omeprazole to twice a day. She did have Hemoccult positive stool back in November. Recent H&H showed a decline in her hemoglobin from 11 to 9. Her last colonoscopy was in October 2010 at which time she had sigmoid hyperplastic polyp. Her last upper endoscopy was in October 2008, she had a moderate sized hiatal hernia and a patulous EG junction. Mother has a history of colon cancer diagnosed in her 39s.  No fresh blood per rectum. Complains of seeing very dark stools, almost black, very loose. BM 4-5 per day. Cannot remember last solid stool. Maybe once per month. Chronically loose stools for past two years. No heartburn on omeprazole. Back to once per day. No anorexia. No n/v, dysphagia. Had another round of labs last week with her PCP. Has not heard results yet.  Has not started aspirin yet. Was advised to start after her procedures if okay with GI.  Current Outpatient Prescriptions  Medication Sig Dispense Refill  . aspirin EC 81 MG tablet Take 1 tablet (81 mg total) by mouth daily.  30 tablet  11  . atorvastatin (LIPITOR) 40 MG tablet Take 1 tablet (40 mg total) by mouth daily.  30 tablet  11  . carvedilol (COREG) 6.25 MG tablet Take 1 tablet (6.25 mg total) by mouth  2 (two) times daily with a meal.  60 tablet  11  . FLUoxetine (PROZAC) 40 MG capsule Take 40 mg by mouth daily.      . furosemide (LASIX) 20 MG tablet Take 1 tablet (20 mg) as needed for swelling.  30 tablet  11  . metFORMIN (GLUCOPHAGE) 500 MG tablet Take by mouth 4 (four) times daily.      . Multiple Vitamins-Minerals (PRESERVISION AREDS PO) Take by mouth.      . omega-3 acid ethyl esters (LOVAZA) 1 G capsule Take 2 g by mouth 2 (two) times daily.      Marland Kitchen omeprazole (PRILOSEC) 20 MG capsule Take 20 mg by mouth daily.       Marland Kitchen oxyCODONE-acetaminophen (PERCOCET/ROXICET) 5-325 MG per tablet Take 1 tablet by mouth every 6 (six) hours as needed for pain.      . Probiotic Product (PHILLIPS COLON HEALTH PO) Take 1 tablet by mouth daily.      Marland Kitchen tiZANidine (ZANAFLEX) 4 MG tablet Take 4 mg by mouth every 6 (six) hours as needed.      . traZODone (DESYREL) 50 MG tablet Take 50 mg by mouth at bedtime.      . Zinc 50 MG TABS Take by mouth as directed.       No current facility-administered medications for this visit.    Allergies as of 10/08/2013 - Review Complete 10/08/2013  Allergen Reaction Noted  . Codeine  07/02/2013    Past Medical History  Diagnosis Date  . GERD (gastroesophageal reflux disease)   . Hypercholesterolemia   . Diabetes mellitus   . Hypertension   . Bursitis   . FHx: migraine headaches   . Renal lithiasis   . 10 year risk of MI or stroke 7.5% or greater   . 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 Partial hysterectomy    . Hip surgery      right  . Cholecystectomy    . Esophagogastroduodenoscopy  06/12/2007    [id number] esophagus/patulous EG junction, moderate sized  hiatal hernia, otherwise normal stomach, D1, D2  . Colonoscopy  06/15/2005    RMR: Minimal internal hemorrhoids. Otherwise normal rectum/Normal colon  . Colonoscopy    12/14/2001    RMR: Internal hemorrhage, otherwise, normal rectum/ Polyps at 30 and 35  cm resected; the remainder of colonic mucosa appeared normal  . Colonoscopy  05/2009    RMR: minimal hemorrhoids, sigmoid hyperplastic polyp    Family History  Problem Relation Age of Onset  . Pancreatic cancer Father 65  . Colon cancer Mother     late 68s    History   Social History  . Marital Status: Divorced    Spouse Name: N/A    Number of Children: 3  . Years of Education: N/A   Occupational History  . retired from [employer]    Social History Main Topics  . Smoking status: Former Smoker -- 1.00 packs/day for 43 years    Types: Cigarettes    Start date: 08/31/1959    Quit date: 08/30/2002  . Smokeless tobacco: Never Used     Comment: Quit x 10 years  . Alcohol Use: No  . Drug Use: No  . Sexual Activity: Not on file   Other Topics Concern  . Not on file   Social History Narrative  . No narrative on file      ROS:  General: Negative for anorexia, weight loss, fever, chills, fatigue, weakness. Eyes: Negative for vision changes.  ENT: Negative for hoarseness, difficulty swallowing , nasal congestion. CV: Negative for chest pain, angina, palpitations, dyspnea on exertion, peripheral edema.  Respiratory: Negative for dyspnea at rest, dyspnea on exertion, cough, sputum, wheezing.  GI: See history of present illness. GU:  Negative for dysuria, hematuria, urinary incontinence, urinary frequency, nocturnal urination.  MS: Negative for joint pain, low back pain.  Derm: Negative for rash or itching.  Neuro: Negative for weakness, abnormal sensation, seizure, frequent headaches, memory loss, confusion.  Psych: Negative for anxiety, depression, suicidal ideation, hallucinations.  Endo: Negative for unusual weight change.  Heme: Negative for bruising or bleeding. Allergy: Negative for rash or hives.    Physical Examination:  BP 152/78  Pulse 66  Temp(Src) 97.2 F (36.2 C) (Oral)  Ht 5\' 2"  (1.575 m)  Wt 114 lb 6.4 oz (51.891 kg)  BMI 20.92 kg/m2   General:  Well-nourished, well-developed in no acute distress.  Head: Normocephalic, atraumatic.   Eyes: Conjunctiva pink, no icterus. Mouth: Oropharyngeal mucosa moist and pink , no lesions erythema or exudate. Neck: Supple without thyromegaly, masses, or lymphadenopathy.  Lungs: Clear to auscultation bilaterally.  Heart: Regular rate and rhythm, no murmurs rubs or gallops.  Abdomen: Bowel sounds are normal, nontender, nondistended, no hepatosplenomegaly or masses, no abdominal bruits or    hernia , no rebound or guarding.   Rectal: not performed Extremities: No lower extremity edema. No clubbing or deformities.  Neuro: Alert and oriented x 4 ,  grossly normal neurologically.  Skin: Warm and dry, no rash or jaundice.   Psych: Alert and cooperative, normal mood and affect.  Labs: Lab Results  Component Value Date   WBC 8.8 09/05/2013   HGB 9.1* 09/05/2013   HCT 27.5* 09/05/2013   MCV 89.9 09/05/2013   PLT 194.0 09/05/2013     Imaging Studies: No results found.

## 2013-10-08 NOTE — Progress Notes (Signed)
cc'd to pcp 

## 2013-10-10 NOTE — Progress Notes (Signed)
Labs from PCP dated 10/04/2013 BUN 17, creatinine 1.48, total bilirubin 0.3, alkaline phosphatase 62, AST 41, ALT 38, TSH 3.770, hemoglobin A1c 5.7, white blood cell count 6400, hemoglobin 10.1, hematocrit 32.5, MCV 91, platelets 213,000.  TCS/EGD as planned. Repeat CBC, ferritin in 8 weeks.

## 2013-10-12 ENCOUNTER — Encounter (HOSPITAL_COMMUNITY): Payer: Self-pay | Admitting: Pharmacy Technician

## 2013-10-15 ENCOUNTER — Other Ambulatory Visit: Payer: Self-pay | Admitting: Gastroenterology

## 2013-10-15 DIAGNOSIS — Z862 Personal history of diseases of the blood and blood-forming organs and certain disorders involving the immune mechanism: Secondary | ICD-10-CM

## 2013-10-15 NOTE — Progress Notes (Signed)
Lab order on file. 

## 2013-10-24 ENCOUNTER — Ambulatory Visit (HOSPITAL_COMMUNITY)
Admission: RE | Admit: 2013-10-24 | Discharge: 2013-10-24 | Disposition: A | Discharge status: Home / Self Care | Payer: Medicare Other | Source: Ambulatory Visit | Attending: Internal Medicine | Admitting: Internal Medicine

## 2013-10-24 ENCOUNTER — Encounter (HOSPITAL_COMMUNITY)
Admission: RE | Disposition: A | Discharge status: Home / Self Care | Payer: Self-pay | Source: Ambulatory Visit | Attending: Internal Medicine

## 2013-10-24 ENCOUNTER — Encounter (HOSPITAL_COMMUNITY): Payer: Self-pay | Admitting: *Deleted

## 2013-10-24 DIAGNOSIS — K449 Diaphragmatic hernia without obstruction or gangrene: Secondary | ICD-10-CM | POA: Diagnosis not present

## 2013-10-24 DIAGNOSIS — Z79899 Other long term (current) drug therapy: Secondary | ICD-10-CM | POA: Diagnosis not present

## 2013-10-24 DIAGNOSIS — E119 Type 2 diabetes mellitus without complications: Secondary | ICD-10-CM | POA: Diagnosis not present

## 2013-10-24 DIAGNOSIS — D509 Iron deficiency anemia, unspecified: Secondary | ICD-10-CM | POA: Insufficient documentation

## 2013-10-24 DIAGNOSIS — K5730 Diverticulosis of large intestine without perforation or abscess without bleeding: Secondary | ICD-10-CM | POA: Insufficient documentation

## 2013-10-24 DIAGNOSIS — Z7982 Long term (current) use of aspirin: Secondary | ICD-10-CM | POA: Diagnosis not present

## 2013-10-24 DIAGNOSIS — K921 Melena: Secondary | ICD-10-CM | POA: Insufficient documentation

## 2013-10-24 DIAGNOSIS — D126 Benign neoplasm of colon, unspecified: Secondary | ICD-10-CM | POA: Insufficient documentation

## 2013-10-24 DIAGNOSIS — R195 Other fecal abnormalities: Secondary | ICD-10-CM

## 2013-10-24 DIAGNOSIS — I10 Essential (primary) hypertension: Secondary | ICD-10-CM | POA: Insufficient documentation

## 2013-10-24 DIAGNOSIS — E7800 Pure hypercholesterolemia, unspecified: Secondary | ICD-10-CM | POA: Insufficient documentation

## 2013-10-24 HISTORY — PX: ESOPHAGOGASTRODUODENOSCOPY: SHX5428

## 2013-10-24 HISTORY — PX: COLONOSCOPY: SHX5424

## 2013-10-24 SURGERY — COLONOSCOPY
Anesthesia: Moderate Sedation

## 2013-10-24 MED ORDER — MIDAZOLAM HCL 5 MG/5ML IJ SOLN
INTRAMUSCULAR | Status: AC
  Filled 2013-10-24: qty 10

## 2013-10-24 MED ORDER — SODIUM CHLORIDE 0.9 % IV SOLN
INTRAVENOUS | Status: DC
Start: 2013-10-24 — End: 2013-10-24
  Administered 2013-10-24: 1000 mL via INTRAVENOUS

## 2013-10-24 MED ORDER — MEPERIDINE HCL 100 MG/ML IJ SOLN
INTRAMUSCULAR | Status: DC | PRN
  Administered 2013-10-24: 50 mg via INTRAVENOUS

## 2013-10-24 MED ORDER — MEPERIDINE HCL 100 MG/ML IJ SOLN
INTRAMUSCULAR | Status: AC
  Filled 2013-10-24: qty 2

## 2013-10-24 MED ORDER — LIDOCAINE VISCOUS 2 % MT SOLN
OROMUCOSAL | Status: DC
Start: 2013-10-24 — End: 2013-10-24
  Filled 2013-10-24: qty 15

## 2013-10-24 MED ORDER — SODIUM CHLORIDE 0.9 % IJ SOLN
INTRAMUSCULAR | Status: AC
  Filled 2013-10-24: qty 10

## 2013-10-24 MED ORDER — PROMETHAZINE HCL 25 MG/ML IJ SOLN
INTRAMUSCULAR | Status: AC
  Filled 2013-10-24: qty 1

## 2013-10-24 MED ORDER — PROMETHAZINE HCL 25 MG/ML IJ SOLN
12.5000 mg | Freq: Once | INTRAMUSCULAR | Status: AC
  Administered 2013-10-24: 12.5 mg via INTRAVENOUS

## 2013-10-24 MED ORDER — ONDANSETRON HCL 4 MG/2ML IJ SOLN
INTRAMUSCULAR | Status: AC
  Filled 2013-10-24: qty 2

## 2013-10-24 MED ORDER — STERILE WATER FOR IRRIGATION IR SOLN
Status: DC | PRN
  Administered 2013-10-24: 11:00:00

## 2013-10-24 MED ORDER — MIDAZOLAM HCL 5 MG/5ML IJ SOLN
INTRAMUSCULAR | Status: DC | PRN
  Administered 2013-10-24: 1 mg via INTRAVENOUS
  Administered 2013-10-24: 2 mg via INTRAVENOUS

## 2013-10-24 MED ORDER — ONDANSETRON HCL 4 MG/2ML IJ SOLN
INTRAMUSCULAR | Status: DC | PRN
  Administered 2013-10-24: 4 mg via INTRAVENOUS

## 2013-10-24 MED ORDER — LIDOCAINE VISCOUS 2 % MT SOLN
OROMUCOSAL | Status: DC | PRN
  Administered 2013-10-24: 3 mL via OROMUCOSAL

## 2013-10-24 NOTE — H&P (View-Only) (Signed)
Primary Care Physician:  Kern Alberta  Primary Gastroenterologist:  Garfield Cornea, MD   Chief Complaint  Patient presents with  . Colonoscopy    HPI:  [name] is a 73 y.o. female here to schedule colonoscopy. We saw her back in November for further evaluation of iron deficiency anemia (documented by labs in September 2014 with a low ferritin, low iron and iron saturations. Vitamin B12 is also low normal.). Colonoscopy had to be delayed because of diagnosis of NSTEMI-Takotsubo cardiomyopathy. She had developed atrial fibrillation during her catheterization and was on Xarelto when I last saw her. She really did not have any GI symptoms outside of indigestion during her hospitalization. This had resolved with increasing omeprazole to twice a day. She did have Hemoccult positive stool back in November. Recent H&H showed a decline in her hemoglobin from 11 to 9. Her last colonoscopy was in October 2010 at which time she had sigmoid hyperplastic polyp. Her last upper endoscopy was in October 2008, she had a moderate sized hiatal hernia and a patulous EG junction. Mother has a history of colon cancer diagnosed in her 30s.  No fresh blood per rectum. Complains of seeing very dark stools, almost black, very loose. BM 4-5 per day. Cannot remember last solid stool. Maybe once per month. Chronically loose stools for past two years. No heartburn on omeprazole. Back to once per day. No anorexia. No n/v, dysphagia. Had another round of labs last week with her PCP. Has not heard results yet.  Has not started aspirin yet. Was advised to start after her procedures if okay with GI.  Current Outpatient Prescriptions  Medication Sig Dispense Refill  . aspirin EC 81 MG tablet Take 1 tablet (81 mg total) by mouth daily.  30 tablet  11  . atorvastatin (LIPITOR) 40 MG tablet Take 1 tablet (40 mg total) by mouth daily.  30 tablet  11  . carvedilol (COREG) 6.25 MG tablet Take 1 tablet (6.25 mg total) by mouth  2 (two) times daily with a meal.  60 tablet  11  . FLUoxetine (PROZAC) 40 MG capsule Take 40 mg by mouth daily.      . furosemide (LASIX) 20 MG tablet Take 1 tablet (20 mg) as needed for swelling.  30 tablet  11  . metFORMIN (GLUCOPHAGE) 500 MG tablet Take by mouth 4 (four) times daily.      . Multiple Vitamins-Minerals (PRESERVISION AREDS PO) Take by mouth.      . omega-3 acid ethyl esters (LOVAZA) 1 G capsule Take 2 g by mouth 2 (two) times daily.      Marland Kitchen omeprazole (PRILOSEC) 20 MG capsule Take 20 mg by mouth daily.       Marland Kitchen oxyCODONE-acetaminophen (PERCOCET/ROXICET) 5-325 MG per tablet Take 1 tablet by mouth every 6 (six) hours as needed for pain.      . Probiotic Product (PHILLIPS COLON HEALTH PO) Take 1 tablet by mouth daily.      Marland Kitchen tiZANidine (ZANAFLEX) 4 MG tablet Take 4 mg by mouth every 6 (six) hours as needed.      . traZODone (DESYREL) 50 MG tablet Take 50 mg by mouth at bedtime.      . Zinc 50 MG TABS Take by mouth as directed.       No current facility-administered medications for this visit.    Allergies as of 10/08/2013 - Review Complete 10/08/2013  Allergen Reaction Noted  . Codeine  07/02/2013    Past Medical History  Diagnosis Date  . GERD (gastroesophageal reflux disease)   . Hypercholesterolemia   . Diabetes mellitus   . Hypertension   . Bursitis   . FHx: migraine headaches   . Renal lithiasis   . 10 year risk of MI or stroke 7.5% or greater   . 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 Partial hysterectomy    . Hip surgery      right  . Cholecystectomy    . Esophagogastroduodenoscopy  06/12/2007    [id number] esophagus/patulous EG junction, moderate sized  hiatal hernia, otherwise normal stomach, D1, D2  . Colonoscopy  06/15/2005    RMR: Minimal internal hemorrhoids. Otherwise normal rectum/Normal colon  . Colonoscopy    12/14/2001    RMR: Internal hemorrhage, otherwise, normal rectum/ Polyps at 30 and 35  cm resected; the remainder of colonic mucosa appeared normal  . Colonoscopy  05/2009    RMR: minimal hemorrhoids, sigmoid hyperplastic polyp    Family History  Problem Relation Age of Onset  . Pancreatic cancer Father 54  . Colon cancer Mother     late 63s    History   Social History  . Marital Status: Divorced    Spouse Name: N/A    Number of Children: 3  . Years of Education: N/A   Occupational History  . retired from [employer]    Social History Main Topics  . Smoking status: Former Smoker -- 1.00 packs/day for 43 years    Types: Cigarettes    Start date: 08/31/1959    Quit date: 08/30/2002  . Smokeless tobacco: Never Used     Comment: Quit x 10 years  . Alcohol Use: No  . Drug Use: No  . Sexual Activity: Not on file   Other Topics Concern  . Not on file   Social History Narrative  . No narrative on file      ROS:  General: Negative for anorexia, weight loss, fever, chills, fatigue, weakness. Eyes: Negative for vision changes.  ENT: Negative for hoarseness, difficulty swallowing , nasal congestion. CV: Negative for chest pain, angina, palpitations, dyspnea on exertion, peripheral edema.  Respiratory: Negative for dyspnea at rest, dyspnea on exertion, cough, sputum, wheezing.  GI: See history of present illness. GU:  Negative for dysuria, hematuria, urinary incontinence, urinary frequency, nocturnal urination.  MS: Negative for joint pain, low back pain.  Derm: Negative for rash or itching.  Neuro: Negative for weakness, abnormal sensation, seizure, frequent headaches, memory loss, confusion.  Psych: Negative for anxiety, depression, suicidal ideation, hallucinations.  Endo: Negative for unusual weight change.  Heme: Negative for bruising or bleeding. Allergy: Negative for rash or hives.    Physical Examination:  BP 152/78  Pulse 66  Temp(Src) 97.2 F (36.2 C) (Oral)  Ht 5\' 2"  (1.575 m)  Wt 114 lb 6.4 oz (51.891 kg)  BMI 20.92 kg/m2   General:  Well-nourished, well-developed in no acute distress.  Head: Normocephalic, atraumatic.   Eyes: Conjunctiva pink, no icterus. Mouth: Oropharyngeal mucosa moist and pink , no lesions erythema or exudate. Neck: Supple without thyromegaly, masses, or lymphadenopathy.  Lungs: Clear to auscultation bilaterally.  Heart: Regular rate and rhythm, no murmurs rubs or gallops.  Abdomen: Bowel sounds are normal, nontender, nondistended, no hepatosplenomegaly or masses, no abdominal bruits or    hernia , no rebound or guarding.   Rectal: not performed Extremities: No lower extremity edema. No clubbing or deformities.  Neuro: Alert and oriented x 4 ,  grossly normal neurologically.  Skin: Warm and dry, no rash or jaundice.   Psych: Alert and cooperative, normal mood and affect.  Labs: Lab Results  Component Value Date   WBC 8.8 09/05/2013   HGB 9.1* 09/05/2013   HCT 27.5* 09/05/2013   MCV 89.9 09/05/2013   PLT 194.0 09/05/2013     Imaging Studies: No results found.

## 2013-10-24 NOTE — Op Note (Signed)
[facility] [address] [place], [address]   ENDOSCOPY PROCEDURE REPORT  PATIENT: [name], [name]  MR#: [id number] BIRTHDATE: 17-Nov-1940 , 72  yrs. old GENDER: Female ENDOSCOPIST: R.  Garfield Cornea, MD FACP [facility] REFERRED BY:  Gar Ponto, M.D. PROCEDURE DATE:  10/24/2013 PROCEDURE:     Diagnostic EGD  INDICATIONS:      Reported melena; iron deficiency anemia  INFORMED CONSENT:   The risks, benefits, limitations, alternatives and imponderables have been discussed.  The potential for biopsy, esophogeal dilation, etc. have also been reviewed.  Questions have been answered.  All parties agreeable.  Please see the history and physical in the medical record for more information.  MEDICATIONS:    Versed 3 mg IV and Demerol 50 mg IV and  Xylocaine 2%  orally  DESCRIPTION OF PROCEDURE:   The [id number])  endoscope was introduced through the mouth and advanced to the second portion of the duodenum without difficulty or limitations.  The mucosal surfaces were surveyed very carefully during advancement of the scope and upon withdrawal.  Retroflexion view of the proximal stomach and esophagogastric junction was performed.      FINDINGS:  Normal esophagus. Stomach empty. Small hiatal hernia; otherwise, the gastric because appeared normal. Patent pylorus. Normal first, second and third portion of the duodenum.  THERAPEUTIC / DIAGNOSTIC MANEUVERS PERFORMED :  None   COMPLICATIONS:  None  IMPRESSION:  Small hiatal hernia; otherwise, normal EGD  RECOMMENDATIONS:  See colonoscopy report    _______________________________ R. Garfield Cornea, MD FACP [facility] eSigned:  R. Garfield Cornea, MD FACP [facility] 10/24/2013 11:20 AM     CC:  PATIENT NAME:  [name], [name] MR#: [id number]

## 2013-10-24 NOTE — Discharge Instructions (Signed)
Colonoscopy Discharge Instructions  Read the instructions outlined below and refer to this sheet in the next few weeks. These discharge instructions provide you with general information on caring for yourself after you leave the hospital. Your doctor may also give you specific instructions. While your treatment has been planned according to the most current medical practices available, unavoidable complications occasionally occur. If you have any problems or questions after discharge, call Dr. Gala Romney at [phone]. ACTIVITY  You may resume your regular activity, but move at a slower pace for the next 24 hours.   Take frequent rest periods for the next 24 hours.   Walking will help get rid of the air and reduce the bloated feeling in your belly (abdomen).   No driving for 24 hours (because of the medicine (anesthesia) used during the test).    Do not sign any important legal documents or operate any machinery for 24 hours (because of the anesthesia used during the test).  NUTRITION  Drink plenty of fluids.   You may resume your normal diet as instructed by your doctor.   Begin with a light meal and progress to your normal diet. Heavy or fried foods are harder to digest and may make you feel sick to your stomach (nauseated).   Avoid alcoholic beverages for 24 hours or as instructed.  MEDICATIONS  You may resume your normal medications unless your doctor tells you otherwise.  WHAT YOU CAN EXPECT TODAY  Some feelings of bloating in the abdomen.   Passage of more gas than usual.   Spotting of blood in your stool or on the toilet paper.  IF YOU HAD POLYPS REMOVED DURING THE COLONOSCOPY:  No aspirin products for 7 days or as instructed.   No alcohol for 7 days or as instructed.   Eat a soft diet for the next 24 hours.  FINDING OUT THE RESULTS OF YOUR TEST Not all test results are available during your visit. If your test results are not back during the visit, make an appointment  with your caregiver to find out the results. Do not assume everything is normal if you have not heard from your caregiver or the medical facility. It is important for you to follow up on all of your test results.  SEEK IMMEDIATE MEDICAL ATTENTION IF:  You have more than a spotting of blood in your stool.   Your belly is swollen (abdominal distention).   You are nauseated or vomiting.   You have a temperature over 101.  You have abdominal pain or discomfort that is severe or gets worse throughout the day. EGD Discharge instructions Please read the instructions outlined below and refer to this sheet in the next few weeks. These discharge instructions provide you with general information on caring for yourself after you leave the hospital. Your doctor may also give you specific instructions. While your treatment has been planned according to the most current medical practices available, unavoidable complications occasionally occur. If you have any problems or questions after discharge, please call your doctor. ACTIVITY You may resume your regular activity but move at a slower pace for the next 24 hours.  Take frequent rest periods for the next 24 hours.  Walking will help expel (get rid of) the air and reduce the bloated feeling in your abdomen.  No driving for 24 hours (because of the anesthesia (medicine) used during the test).  You may shower.  Do not sign any important legal documents or operate any machinery for 24  hours (because of the anesthesia used during the test).  NUTRITION Drink plenty of fluids.  You may resume your normal diet.  Begin with a light meal and progress to your normal diet.  Avoid alcoholic beverages for 24 hours or as instructed by your caregiver.  MEDICATIONS You may resume your normal medications unless your caregiver tells you otherwise.  WHAT YOU CAN EXPECT TODAY You may experience abdominal discomfort such as a feeling of fullness or gas pains.   FOLLOW-UP Your doctor will discuss the results of your test with you.  SEEK IMMEDIATE MEDICAL ATTENTION IF ANY OF THE FOLLOWING OCCUR: Excessive nausea (feeling sick to your stomach) and/or vomiting.  Severe abdominal pain and distention (swelling).  Trouble swallowing.  Temperature over 101 F (37.8 C).  Rectal bleeding or vomiting of blood.    Polyp and diverticulosis information provided  Further recommendations to follow pending review of pathology report  Begin 81 mg aspirin daily tomorrow   Diverticulosis Diverticulosis is a common condition that develops when small pouches (diverticula) form in the wall of the colon. The risk of diverticulosis increases with age. It happens more often in people who eat a low-fiber diet. Most individuals with diverticulosis have no symptoms. Those individuals with symptoms usually experience abdominal pain, constipation, or loose stools (diarrhea). HOME CARE INSTRUCTIONS   Increase the amount of fiber in your diet as directed by your caregiver or dietician. This may reduce symptoms of diverticulosis.  Your caregiver may recommend taking a dietary fiber supplement.  Drink at least 6 to 8 glasses of water each day to prevent constipation.  Try not to strain when you have a bowel movement.  Your caregiver may recommend avoiding nuts and seeds to prevent complications, although this is still an uncertain benefit.  Only take over-the-counter or prescription medicines for pain, discomfort, or fever as directed by your caregiver. FOODS WITH HIGH FIBER CONTENT INCLUDE:  Fruits. Apple, peach, pear, tangerine, raisins, prunes.  Vegetables. Brussels sprouts, asparagus, broccoli, cabbage, carrot, cauliflower, romaine lettuce, spinach, summer squash, tomato, winter squash, zucchini.  Starchy Vegetables. Baked beans, kidney beans, lima beans, split peas, lentils, potatoes (with skin).  Grains. Whole wheat bread, brown rice, bran flake cereal, plain  oatmeal, white rice, shredded wheat, bran muffins. SEEK IMMEDIATE MEDICAL CARE IF:   You develop increasing pain or severe bloating.  You have an oral temperature above 102 F (38.9 C), not controlled by medicine.  You develop vomiting or bowel movements that are bloody or black. Document Released: 05/13/2004 Document Revised: 11/08/2011 Document Reviewed: 01/14/2010 [facility] Patient Information 2014 [place]. Colon Polyps Polyps are lumps of extra tissue growing inside the body. Polyps can grow in the large intestine (colon). Most colon polyps are noncancerous (benign). However, some colon polyps can become cancerous over time. Polyps that are larger than a pea may be harmful. To be safe, caregivers remove and test all polyps. CAUSES  Polyps form when mutations in the genes cause your cells to grow and divide even though no more tissue is needed. RISK FACTORS There are a number of risk factors that can increase your chances of getting colon polyps. They include:  Being older than 50 years.  Family history of colon polyps or colon cancer.  Long-term colon diseases, such as colitis or Crohn disease.  Being overweight.  Smoking.  Being inactive.  Drinking too much alcohol. SYMPTOMS  Most small polyps do not cause symptoms. If symptoms are present, they may include:  Blood in the stool. The  stool may look dark red or black.  Constipation or diarrhea that lasts longer than 1 week. DIAGNOSIS People often do not know they have polyps until their caregiver finds them during a regular checkup. Your caregiver can use 4 tests to check for polyps:  Digital rectal exam. The caregiver wears gloves and feels inside the rectum. This test would find polyps only in the rectum.  Barium enema. The caregiver puts a liquid called barium into your rectum before taking X-rays of your colon. Barium makes your colon look white. Polyps are dark, so they are easy to see in the X-ray  pictures.  Sigmoidoscopy. A thin, flexible tube (sigmoidoscope) is placed into your rectum. The sigmoidoscope has a light and tiny camera in it. The caregiver uses the sigmoidoscope to look at the last third of your colon.  Colonoscopy. This test is like sigmoidoscopy, but the caregiver looks at the entire colon. This is the most common method for finding and removing polyps. TREATMENT  Any polyps will be removed during a sigmoidoscopy or colonoscopy. The polyps are then tested for cancer. PREVENTION  To help lower your risk of getting more colon polyps:  Eat plenty of fruits and vegetables. Avoid eating fatty foods.  Do not smoke.  Avoid drinking alcohol.  Exercise every day.  Lose weight if recommended by your caregiver.  Eat plenty of calcium and folate. Foods that are rich in calcium include milk, cheese, and broccoli. Foods that are rich in folate include chickpeas, kidney beans, and spinach. HOME CARE INSTRUCTIONS Keep all follow-up appointments as directed by your caregiver. You may need periodic exams to check for polyps. SEEK MEDICAL CARE IF: You notice bleeding during a bowel movement. Document Released: 05/12/2004 Document Revised: 11/08/2011 Document Reviewed: 10/26/2011 [facility] Patient Information 2014 [place].

## 2013-10-24 NOTE — Op Note (Signed)
[facility] [address] [place], [address]   COLONOSCOPY PROCEDURE REPORT  PATIENT: [name], [name]  MR#:         [id number] BIRTHDATE: [birth year] , 72  yrs. old GENDER: Female ENDOSCOPIST: R.  Garfield Cornea, MD FACP [facility] REFERRED BY:  Gar Ponto, M.D. PROCEDURE DATE:  10/24/2013 PROCEDURE:     Ileocolonoscopy with multiple snare polypectomies  INDICATIONS:  Iron deficiency anemia; Hemoccult positive stool  INFORMED CONSENT:  The risks, benefits, alternatives and imponderables including but not limited to bleeding, perforation as well as the possibility of a missed lesion have been reviewed.  The potential for biopsy, lesion removal, etc. have also been discussed.  Questions have been answered.  All parties agreeable. Please see the history and physical in the medical record for more information.  MEDICATIONS: Versed 2 mg IV and Demerol 50 mg IV in a single dose. Zofran 4 mg IV  DESCRIPTION OF PROCEDURE:  After a digital rectal exam was performed, the [id number])  colonoscope was advanced from the anus through the rectum and colon to the area of the cecum, ileocecal valve and appendiceal orifice.  The cecum was deeply intubated.  These structures were well-seen and photographed for the record.  From the level of the cecum and ileocecal valve, the scope was slowly and cautiously withdrawn.  The mucosal surfaces were carefully surveyed utilizing scope tip deflection to facilitate fold flattening as needed.  The scope was pulled down into the rectum where a thorough examination including retroflexion was performed.    FINDINGS: Adequate preparation. Scattered left-sided diverticula (1) 8 mm pedunculated polyp in the mid sigmoid segment; (1) 5 mm x 1.25 cm flat polyp at the base of the ileocecal valve. There was (1) 8 mm flat polyp in the mid ascending segment; The remainder of the colonic mucosa appeared normal. The distal 5 cm of terminal  ileum mucosa also appeared normal.  THERAPEUTIC / DIAGNOSTIC MANEUVERS PERFORMED:  The above-mentioned polyps were hot snare removed. Polyp at base of the  ileocecal valve was lifted away from the mucosa with 1 cc of normal saline injected submucosally.It was removed in piecemeal fashion.  COMPLICATIONS: None  CECAL WITHDRAWAL TIME:  23 minutes  IMPRESSION:  Multiple colonic polyps removed as described above. Colonic diverticulosis  RECOMMENDATIONS: Followup on pathology. See EGD report  _______________________________ eSigned:  R. Garfield Cornea, MD FACP [facility] 10/24/2013 11:08 AM   CC:    PATIENT NAME:  Aqueel[place], Bureau MR#: [id number]

## 2013-10-24 NOTE — Interval H&P Note (Signed)
History and Physical Interval Note:  10/24/2013 10:26 AM  [name]  has presented today for surgery, with the diagnosis of IDA  AND HEME POSITIVE STOOLS  The various methods of treatment have been discussed with the patient and family. After consideration of risks, benefits and other options for treatment, the patient has consented to  Procedure(s) with comments: COLONOSCOPY (N/A) - 10:30 ESOPHAGOGASTRODUODENOSCOPY (EGD) (N/A) as a surgical intervention .  The patient's history has been reviewed, patient examined, no change in status, stable for surgery.  I have reviewed the patient's chart and labs.  Questions were answered to the patient's satisfaction.     [name]  No change. Colonoscopy possible EGD per plan.The risks, benefits, limitations, imponderables and alternatives regarding both EGD and colonoscopy have been reviewed with the patient. Questions have been answered. All parties agreeable.

## 2013-10-25 ENCOUNTER — Encounter: Payer: Self-pay | Admitting: Internal Medicine

## 2013-10-29 ENCOUNTER — Encounter (HOSPITAL_COMMUNITY): Payer: Self-pay | Admitting: Internal Medicine

## 2013-11-07 ENCOUNTER — Ambulatory Visit (INDEPENDENT_AMBULATORY_CARE_PROVIDER_SITE_OTHER): Payer: Medicare Other | Admitting: Cardiovascular Disease

## 2013-11-07 ENCOUNTER — Encounter: Payer: Self-pay | Admitting: Cardiovascular Disease

## 2013-11-07 ENCOUNTER — Other Ambulatory Visit: Payer: Self-pay

## 2013-11-07 VITALS — BP 136/67 | HR 62 | Ht 62.0 in | Wt 116.0 lb

## 2013-11-07 DIAGNOSIS — I2510 Atherosclerotic heart disease of native coronary artery without angina pectoris: Secondary | ICD-10-CM

## 2013-11-07 MED ORDER — ATORVASTATIN CALCIUM 10 MG PO TABS: 10.0000 mg | ORAL_TABLET | Freq: Every day | ORAL | Status: DC

## 2013-11-07 NOTE — Progress Notes (Signed)
HPI:  73 year old woman presenting for followup evaluation. The patient had a prolonged hospitalization in 2014 with acute respiratory failure related to pneumonia and sepsis. During that acute illness she had a non-ST elevation MI and was found to have severe LV dysfunction with an ejection fraction of 20-25%. Cardiac catheterization demonstrated no evidence of obstructive CAD. 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 She developed atrial fibrillation requiring amiodarone and the patient was anticoagulated with Xarelto. However, she had difficulty with anemia and guaiac positive stools. A followup echocardiogram in January demonstrated normalization of LV function. Xarelto was discontinued in the setting of her anemia. It was recommended that she start aspirin 81 mg daily.  The patient is doing well from a cardiac perspective. She denies chest pain, shortness of breath, or palpitations. She's complaining of muscle aches in her upper legs and is not sure what this is coming from. There is no relation to physical activity. She denies calf claudication symptoms.  Outpatient Encounter Prescriptions as of 11/07/2013  Medication Sig  . aspirin EC 81 MG tablet Take 1 tablet (81 mg total) by mouth daily.  Marland Kitchen atorvastatin (LIPITOR) 40 MG tablet Take 1 tablet (40 mg total) by mouth daily.  . carvedilol (COREG) 6.25 MG tablet Take 1 tablet (6.25 mg total) by mouth 2 (two) times daily with a meal.  . FLUoxetine (PROZAC) 40 MG capsule Take 40 mg by mouth daily.  . furosemide (LASIX) 20 MG tablet Take 1 tablet (20 mg) as needed for swelling.  . Multiple Vitamins-Minerals (PRESERVISION AREDS PO) Take by mouth.  Marland Kitchen omeprazole (PRILOSEC) 20 MG capsule Take 20 mg by mouth daily.   Marland Kitchen oxyCODONE-acetaminophen (PERCOCET/ROXICET) 5-325 MG per tablet Take 1 tablet by mouth every 6 (six) hours as needed for moderate pain or severe pain (for back pain).   . Probiotic Product (PHILLIPS COLON HEALTH PO) Take 1  tablet by mouth daily.  . traZODone (DESYREL) 50 MG tablet Take 50 mg by mouth at bedtime.  . Zinc 50 MG TABS Take by mouth as directed.  . [DISCONTINUED] peg 3350 powder (MOVIPREP) 100 G SOLR Take 1 kit (200 g total) by mouth as directed. PHARMACIST USE THE FOLLOWING FOR PATIENT DISCOUNT BIN: [id number] GROUP: [id number] ID: [id number] PATIENTS WILL SAVE UP TO $10 ON THEIR OUT-OF-POCKET EXPENSE FOR PROCESSING QUESTIONS, CALL [phone]    Allergies  Allergen Reactions  . Codeine Other (See Comments)    Reaction unknown     Past Medical History  Diagnosis Date  . GERD (gastroesophageal reflux disease)   . Hypercholesterolemia   . Diabetes mellitus   . Hypertension   . Bursitis   . FHx: migraine headaches   . Renal lithiasis   . 10 year risk of MI or stroke 7.5% or greater   . 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 Systolic function was normal. The estimated ejection fraction was in the range of 55% to 60%. Wall motion was normal; there were no regional  wall motion abnormalities. - Left atrium: The atrium was mildly dilated.  ASSESSMENT AND PLAN: 1. 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 As above, the patient's left ventricular function has normalized. She has no signs of congestive heart failure on exam or from a symptomatic perspective. I think her cardiac prognosis is very good. I would recommend that she continue on carvedilol 6.25 mg twice daily and followup in 1  year.  2. Hyperlipidemia. The patient takes atorvastatin 40 mg. I suspect her myalgias are related to this. Lipids are followed by her primary care physician, Dr. Olena Heckle. I asked her to take a one-week statin holiday, then reduce atorvastatin to 10 mg, then followup with Dr. Olena Heckle in a few months with lab work.  3. Coronary artery disease, nonobstructive. Continue aspirin. Hopefully she will be able to tolerate atorvastatin and a lower dose.  For followup I will see her back in one year.  Sherren Mocha 11/07/2013 9:18 AM

## 2013-11-07 NOTE — Patient Instructions (Addendum)
Your physician has recommended you make the following change in your medication:  HOLD Atorvastatin for 1 week then resume at 10 mg daily  Your physician recommends that you have repeat cholesterol screening with your Primary Care Physician and send results to [place]  Your physician wants you to follow-up in: 1 year with Dr. Burt Knack. You will receive a reminder letter in the mail two months in advance. If you don't receive a letter, please call our office to schedule the follow-up appointment.

## 2013-11-20 ENCOUNTER — Other Ambulatory Visit: Payer: Self-pay

## 2013-11-20 DIAGNOSIS — Z862 Personal history of diseases of the blood and blood-forming organs and certain disorders involving the immune mechanism: Secondary | ICD-10-CM

## 2013-12-03 LAB — CBC WITH DIFFERENTIAL/PLATELET
Basophils Absolute: 0 10*3/uL (ref 0.0–0.1)
Basophils Relative: 0 % (ref 0–1)
EOS PCT: 2 % (ref 0–5)
Eosinophils Absolute: 0.1 10*3/uL (ref 0.0–0.7)
HCT: 30.2 % — ABNORMAL LOW (ref 36.0–46.0)
Hemoglobin: 9.6 g/dL — ABNORMAL LOW (ref 12.0–15.0)
LYMPHS ABS: 1.4 10*3/uL (ref 0.7–4.0)
LYMPHS PCT: 19 % (ref 12–46)
MCH: 27 pg (ref 26.0–34.0)
MCHC: 31.8 g/dL (ref 30.0–36.0)
MCV: 85.1 fL (ref 78.0–100.0)
Monocytes Absolute: 0.6 10*3/uL (ref 0.1–1.0)
Monocytes Relative: 8 % (ref 3–12)
NEUTROS ABS: 5.3 10*3/uL (ref 1.7–7.7)
Neutrophils Relative %: 71 % (ref 43–77)
PLATELETS: 236 10*3/uL (ref 150–400)
RBC: 3.55 MIL/uL — AB (ref 3.87–5.11)
RDW: 14.6 % (ref 11.5–15.5)
WBC: 7.4 10*3/uL (ref 4.0–10.5)

## 2013-12-04 LAB — FERRITIN: FERRITIN: 49 ng/mL (ref 10–291)

## 2013-12-06 ENCOUNTER — Telehealth: Payer: Self-pay | Admitting: Cardiovascular Disease

## 2013-12-06 NOTE — Telephone Encounter (Signed)
New message    Patient having upcoming surgery on  5/11 @ [facility].     Dr. Kirk Ruths is asking for cardiac clearance. Patient stated she does not have phone number

## 2013-12-07 NOTE — Telephone Encounter (Signed)
The physician is Bethann Goo, M.D. (urologist) and the office phone number is [phone]. I contacted the MD office and they will fax a request to our office for surgical clearance.

## 2013-12-10 ENCOUNTER — Other Ambulatory Visit: Payer: Self-pay | Admitting: Gastroenterology

## 2013-12-10 DIAGNOSIS — Z862 Personal history of diseases of the blood and blood-forming organs and certain disorders involving the immune mechanism: Secondary | ICD-10-CM

## 2013-12-21 NOTE — Telephone Encounter (Signed)
Records faxed and pt aware.

## 2013-12-21 NOTE — Telephone Encounter (Signed)
I spoke with Juliann Pulse at Dr Leward Quan office and they are requesting surgical clearance for the pt to have a percutaneous nephrostolithotomy, no restriction on ASA. Clearance should be Faxed to [phone]. I will forward this message to Dr Burt Knack for review.

## 2013-12-21 NOTE — Telephone Encounter (Signed)
She is ok to proceed at low-risk of cardiac complications. She was recently seen in the office and was doing well.  [name] 12/21/2013 3:30 PM

## 2014-01-14 ENCOUNTER — Telehealth: Payer: Self-pay | Admitting: Cardiovascular Disease

## 2014-01-14 NOTE — Telephone Encounter (Signed)
New message    Just release from hospital from at [facility] on yesterday . Surgery last Monday .Marland Kitchen   CHF , fluid on lung, on coumadin & lasix now .    Need an appointment prior to going back to [facility] as follow up  . No appointment has not been set up yet .  Daughter think she need to follow up with Dr. Burt Knack.

## 2014-01-14 NOTE — Telephone Encounter (Signed)
I spoke with the pt's daughter [name] and she said the pt is scheduled for another procedure at [facility] on 01/24/14 for kidney stone.  The pt had surgery last Monday and after the procedure the pt's BP dropped and they gave her fluids.  The pt then developed symptoms of CHF and a possible blood clot.  The pt was discharged on Coumadin and Lasix.  Per instructions from [facility] the pt is scheduled to see her PCP Dr Olena Heckle tomorrow at 1:00 for INR, address lasix, kidney function and liver function.  The pt is also scheduled to go back to [facility] on Friday at 8 AM. The pt's daughter said they also instructed the pt to follow-up with Cardiology this week.  Dr Burt Knack is out of the office this week so I will try to arrange an appointment with an extender.   I have arranged for the pt to see Truitt Merle NP at 3:30 on 01/14/14. The pt's daughter will try to arrange earlier follow-up tomorrow with Dr Olena Heckle.  [facility] records are in [place].

## 2014-01-14 NOTE — Telephone Encounter (Signed)
Left message on machine for [name] to contact the office.

## 2014-01-15 ENCOUNTER — Encounter: Payer: Self-pay | Admitting: Cardiovascular Disease

## 2014-01-15 ENCOUNTER — Encounter: Payer: Self-pay | Admitting: Nurse Practitioner

## 2014-01-15 ENCOUNTER — Ambulatory Visit (INDEPENDENT_AMBULATORY_CARE_PROVIDER_SITE_OTHER): Payer: Medicare Other | Admitting: Nurse Practitioner

## 2014-01-15 VITALS — BP 110/60 | HR 69 | Ht 62.0 in | Wt 113.2 lb

## 2014-01-15 DIAGNOSIS — R0989 Other specified symptoms and signs involving the circulatory and respiratory systems: Secondary | ICD-10-CM

## 2014-01-15 DIAGNOSIS — I5022 Chronic systolic (congestive) heart failure: Secondary | ICD-10-CM

## 2014-01-15 DIAGNOSIS — R7989 Other specified abnormal findings of blood chemistry: Secondary | ICD-10-CM

## 2014-01-15 DIAGNOSIS — I5032 Chronic diastolic (congestive) heart failure: Secondary | ICD-10-CM

## 2014-01-15 DIAGNOSIS — R0600 Dyspnea, unspecified: Secondary | ICD-10-CM

## 2014-01-15 DIAGNOSIS — I480 Paroxysmal atrial fibrillation: Secondary | ICD-10-CM

## 2014-01-15 DIAGNOSIS — Z9189 Other specified personal risk factors, not elsewhere classified: Secondary | ICD-10-CM

## 2014-01-15 DIAGNOSIS — I4891 Unspecified atrial fibrillation: Secondary | ICD-10-CM

## 2014-01-15 DIAGNOSIS — R945 Abnormal results of liver function studies: Secondary | ICD-10-CM

## 2014-01-15 DIAGNOSIS — Z79899 Other long term (current) drug therapy: Secondary | ICD-10-CM

## 2014-01-15 DIAGNOSIS — R0609 Other forms of dyspnea: Secondary | ICD-10-CM

## 2014-01-15 DIAGNOSIS — Z789 Other specified health status: Secondary | ICD-10-CM

## 2014-01-15 MED ORDER — AMIODARONE HCL 200 MG PO TABS: 200.0000 mg | ORAL_TABLET | Freq: Every day | ORAL | Status: DC

## 2014-01-15 NOTE — Progress Notes (Addendum)
[name] Date of Birth: 11-Oct-1940 Medical Record [id number]  History of Present Illness: [name] is seen back today for a work in visit. She is seen for [name]. She is a 73 year old female with a very complex history. She had a prolonged hospitalization back in 2014 with respiratory failure from pneumonia and sepsis. This was associated with a NSTEMI and found to have severe LV dysfunction with EF of 20 to 25%. Cath showed NO obstructive CAD - felt to have a stress induced CM. Did have atrial fib and was put on amiodarone and Xarelto but had difficulty with anemia and guaiac positive stools. Her Xarelto was stopped and she was kept on aspirin. Her EF did recover and was 50 to 55% per echo in January of 2015.   Her other issues include HLD, GERD, and HTN.  She last saw [name] back in March - was felt to be doing ok. She was no longer on amiodarone according to that note - not clear to me when this was stopped - she was on in January of 2015.   Has had a percutaneous nephrostolithotomy with [name] at [facility] for stones - complicated by hypotension requiring fluids which aggravated her heart failure. ?possible "clot". Discharged on lasix and coumadin. Back on amiodarone taking BID. She is apparently scheduled for another procedure later next week.   Comes in today. Here with her daughter. She is weak and looks weak. Not dizzy. Breathing ok. Thinks her weight is ok. No chest pain. Has already seen her PCP earlier today with labs - not clear as to what was done but may have had LFTs, BMET and INR (which comes back 2.0). No other results noted. She is still having hematuria. She is taking pain medicine. Eating ok. Was on fluid restriction while hospitalized for those 6 days. I have found her records from [facility] - echo was done - normal EF - small pericardial effusion. Low probability VQ scan. BNP 800. Na 129 at discharge with elevated LFTs, and BUN, creat. Does not  look like she had a urine checked. Very concerned about proceeding on with repeat surgery next week. No nausea. No fever.    Current Outpatient Prescriptions  Medication Sig Dispense Refill  . amiodarone (PACERONE) 200 MG tablet Take 1 tablet (200 mg total) by mouth daily.      Marland Kitchen atorvastatin (LIPITOR) 10 MG tablet Take 1 tablet (10 mg total) by mouth daily.  30 tablet  11  . carvedilol (COREG) 6.25 MG tablet Take 1 tablet (6.25 mg total) by mouth 2 (two) times daily with a meal.  60 tablet  11  . cycloSPORINE (RESTASIS) 0.05 % ophthalmic emulsion 1 drop 2 (two) times daily.      Marland Kitchen docusate sodium (COLACE) 100 MG capsule Take 100 mg by mouth 2 (two) times daily.      Marland Kitchen estrogens, conjugated, (PREMARIN) 0.3 MG tablet Take 0.3 mg by mouth daily. Take daily for 21 days then do not take for 7 days.      Marland Kitchen FLUoxetine (PROZAC) 40 MG capsule Take 40 mg by mouth daily.      . furosemide (LASIX) 20 MG tablet Take 20 mg by mouth daily.      . Multiple Vitamins-Minerals (PRESERVISION AREDS PO) Take by mouth.      Marland Kitchen omeprazole (PRILOSEC) 20 MG capsule Take 20 mg by mouth daily.       Marland Kitchen oxyCODONE (OXY IR/ROXICODONE) 5 MG immediate release  tablet Take 5 mg by mouth every 4 (four) hours as needed for severe pain. As directed      . Probiotic Product (PHILLIPS COLON HEALTH PO) Take 1 tablet by mouth daily.      . tamsulosin (FLOMAX) 0.4 MG CAPS capsule Take 0.4 mg by mouth daily.      Marland Kitchen warfarin (COUMADIN) 1 MG tablet Take 4 mg by mouth daily.      . Zinc 50 MG TABS Take by mouth as directed.       No current facility-administered medications for this visit.    Allergies  Allergen Reactions  . Codeine Other (See Comments)    Reaction unknown     Past Medical History  Diagnosis Date  . GERD (gastroesophageal reflux disease)   . Hypercholesterolemia   . Diabetes mellitus   . Hypertension   . Bursitis   . FHx: migraine headaches   . Renal lithiasis   . 10 year risk of MI or stroke 7.5% or greater    . 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 Partial hysterectomy    . Hip surgery      right  . Cholecystectomy    . Esophagogastroduodenoscopy  06/12/2007    [id number] esophagus/patulous EG junction, moderate sized  hiatal hernia, otherwise normal stomach, D1, D2  . Colonoscopy  06/15/2005    RMR: Minimal internal hemorrhoids. Otherwise normal rectum/Normal colon  . Colonoscopy    12/14/2001    RMR: Internal hemorrhage, otherwise, normal rectum/ Polyps at 30 and 35 cm resected; the remainder of colonic mucosa appeared normal  . Colonoscopy  05/2009    RMR: minimal hemorrhoids, sigmoid hyperplastic polyp  . Colonoscopy N/A 10/24/2013    [id number] colonic polyps removed as described above/Colonic diverticulosis  . Esophagogastroduodenoscopy N/A 10/24/2013    RMR: Small hiatal hernia; otherwise, normal EGD    History  Smoking status  . Former Smoker -- 1.00 packs/day for 43 years  . Types: Cigarettes  . Start date: 08/31/1959  . Quit date: 08/30/2002  Smokeless tobacco  . Never Used    Comment: Quit x 10 years    History  Alcohol Use No    Family History  Problem Relation Age of Onset  . Pancreatic cancer Father 71  . Colon cancer Mother     late 67s    Review of Systems: The review of systems is per the HPI.  All other systems were reviewed and are negative.  Physical Exam: BP 110/60  Pulse 69  Ht 5\' 2"  (1.575 m)  Wt 113 lb 3.2 oz (51.347 kg)  BMI 20.70 kg/m2  SpO2 97% Patient appears weak, looks frail but in no acute distress. Skin is warm and dry. Color is normal.  HEENT is unremarkable. Normocephalic/atraumatic. PERRL. Sclera are nonicteric. Neck is supple. No masses. No JVD. Lungs are clear. Cardiac exam shows a regular rate and rhythm. Abdomen is soft. Extremities are without edema. Gait and ROM are intact. No gross neurologic deficits noted.  LABORATORY DATA: EKG today here shows sinus rate of 69.  Prolonged QT of 476 and QTc of 510  Lab Results  Component Value Date   WBC 7.4 12/03/2013   HGB 9.6* 12/03/2013   HCT 30.2* 12/03/2013   PLT 236 12/03/2013   GLUCOSE 103* 09/05/2013   ALT 38* 07/02/2013   AST 27 07/02/2013   NA 139 09/05/2013   K 4.5 09/05/2013   CL 104 09/05/2013   CREATININE  1.5* 09/05/2013   BUN 24* 09/05/2013   CO2 26 09/05/2013   INR 1.07 07/02/2013   Echo Study Conclusions from January 2015  - Left ventricle: The cavity size was normal. Systolic function was normal. The estimated ejection fraction was in the range of 55% to 60%. Wall motion was normal; there were no regional wall motion abnormalities. - Left atrium: The atrium was mildly dilated.    ECHO SUMMARY MAY 2015 The left ventricular size is normal.There is mild concentric left ventricular  hypertrophy.Left ventricular systolic function is low normal.LV ejection  fraction = 50-55%.No segmental wall motion abnormalities seen in the left  ventricle.  The right ventricle is normal in size and function.  The left atrium is mildly dilated.  There is mild mitral regurgitation.  Mild pulmonary hypertension.Estimated right ventricular systolic pressure is  39 mmHg.  Small pericardial effusion.  There is no comparison study available.  -  FINDINGS:  LEFT VENTRICLE  The left ventricular size is normal. There is mild concentric left  ventricular hypertrophy. Left ventricular systolic function is low normal. LV  ejection fraction = 50-55%. Left ventricular filling pattern is impaired. No  segmental wall motion abnormalities seen in the left ventricle.  -  RIGHT VENTRICLE  The right ventricle is normal in size and function.  LEFT ATRIUM  The left atrium is mildly dilated.  RIGHT ATRIUM  Right atrial size is normal. The interatrial septum is intact with no  evidence for an atrial septal defect.  -  AORTIC VALVE  The aortic valve is normal in structure and function. The aortic valve is  trileaflet. The aortic valve  opens well. No aortic regurgitation is present.  -  MITRAL VALVE  The mitral valve leaflets appear normal. There is mild mitral regurgitation.  -  TRICUSPID VALVE  The tricuspid valve is normal in structure and function. There is trace  tricuspid regurgitation. Estimated right atrial pressure is 5 mmHg..  Estimated right ventricular systolic pressure is 39 mmHg. Mild pulmonary  hypertension.  -  PULMONIC VALVE  The pulmonic valve is not well visualized. There is no pulmonic valvular  regurgitation.  -  ARTERIES  The aortic root is normal size.  -  VENOUS  Pulmonary venous flow pattern not well visualized. IVC size was normal.  -  EFFUSION  Small pericardial effusion.  -  -  MMode/2D Measurements & Calculations  IVSd: 1.2 cm  LVIDd: 4.5 cm  LVPWd: 1.3 cm  LVIDs: 3.2 cm  LA dim: 4.4 cm  Ao root: 2.7 cm  EDV(MOD-sp4): 88.4 ml  ESV(MOD-sp4): 36.4 ml  EDV(MOD-sp2): 87.7 ml  ESV(MOD-sp2): 38.6 ml  asc Aorta Diam: 3.1 cm  LVOT diam: 1.8 cm  LA area A2: 22.3 cm2  LA area A4: 16.7 cm2  LA length (vol): 5.5 cm  LA vol: 57.2 ml  LA vol index: 37.6 ml/m2  RA area A4: 13.7 cm2  Doppler Measurements & Calculations  MV E max vel: 72.3 cm/sec  MV A max vel: 105.7 cm/sec  MV E/A: 0.68  Med Peak E' Vel: 5.1 cm/sec  Lat Peak E' Vel: 6.4 cm/sec  E/Lat E`: 11.3  E/Med E`: 14.1  MV dec time: 0.23 sec  SV(LVOT): 53.5 ml  Ao max PG: 8.6 mmHg  Ao mean PG: 4.6 mmHg  Ao V2 VTI: 35.4 cm  AVA (VTI): 1.5 cm2  LV V1 VTI: 21.6 cm  TR max vel: 292.0 cm/sec  TR max PG: 34.1 mmHg  RVSP(TR): 39.1 mmHg  RAP  systole: 5.0 mmHg  Reading Physician:  [place], [id number] 01/11/2014 12:24 PM   PULMONARY VENTILATION PERFUSION (V/Q) SCAN, Jan 10, 2014 06:59:04 PM . INDICATION: CHEST PAIN, PE SUSPECTED786.05 SOB (shortness of breath) 518.81 Acute respirator  COMPARISON: Chest x-ray dated 01/09/2014 . TECHNIQUE: 7.88mCi of Xenon-133 gas was administered via a rebreathing mask, with single  breath, equilibrium, and washout images of the lungs obtained in LPO and RPO projections. Perfusion images of both lung fields in multiple projections were obtained following the intravenous administration of 5.5 mCi of Technetium-52m MAA.  Marland Kitchen FINDINGS: . Perfusion:  Small wedge-shaped photopenic defect in the posterior inferior left lung base best seen on posterior and RPO images. Nonsegmental photopenic defects in the medial aspects of both lungs on the LAO and RAO images. . . Ventilation  Ventilation pattern closely matches the perfusion pattern. The small left basilar defect is not seen, however small perfusion defects are typically without correlate on the ventilation images due to intrinsic limitations of ventilation imaging. No focal gas trapping is evident. .  CONCLUSION:  Very low likelihood ratio for pulmonary embolus by modified PIOPED 2 criteria.     Assessment / Plan: 1. Malaise and fatigue post GU tube placement - I wonder if she has a UTI?? Will check UA.   2. Post op atrial fib - back on amiodarone - I am cutting her dose back to just one a day - will need to readdress this if LFTs do not improve.   3. Past stress induced CM - EF has recovered. Hard to say what the sequence of events last week was - but regardless - had PAF and HF exacerbation. Now back in sinus and does not seem to be overloaded at this time.   4. CKD - has had labs earlier today - will need to review.   5. Past anemia - now back on coumadin - low threshold to stop again if needed - I have stopped her Aspirin  Seeing her GU on Friday. May need a little time to get stronger before proceeding on. Will get her labs from her PCP. Further disposition to follow. Will also ask [name] to review.  Patient is agreeable to this plan and will call if any problems develop in the interim.   Burtis Junes, [name], [place] [address] [place], [place]   [id number] [phone]   Addendum:  Reviewed her labs from [name] on 01/17/14 Her hemoglobin is unchanged at 9.1. Kidney function has improved to 32/1.67 from 38/1.7. LFTS still up at 223 AST and 205 ALT. TSH is normal. She is to have an US of the liver. Her amiodarone was cut back at her OV with me. Urine not totally clean but negative nitrate - awaiting culture.

## 2014-01-15 NOTE — Addendum Note (Signed)
Addended by: Phineas Inches D on: 01/15/2014 04:36 PM   Modules accepted: Orders

## 2014-01-15 NOTE — Patient Instructions (Addendum)
Stop the aspirin  Cut the amiodarone back to just one a day  We will check a urinalysis today and culture  I will get your labs from your primary care doctor  Call the [place] office at [phone] if you have any questions, problems or concerns.

## 2014-01-16 ENCOUNTER — Telehealth: Payer: Self-pay | Admitting: *Deleted

## 2014-01-16 LAB — URINALYSIS
Glucose, UA: NEGATIVE mg/dL
Nitrite: NEGATIVE
Protein, ur: 100 mg/dL — AB
Specific Gravity, Urine: 1.014 (ref 1.005–1.030)
Urobilinogen, UA: 1 mg/dL (ref 0.0–1.0)
pH: 5.5 (ref 5.0–8.0)

## 2014-01-16 NOTE — Telephone Encounter (Signed)
S/w Sonia Baller from Dr. Coralyn Mark Daniel's office  @ [phone] is suppose to be faxing over pt's lab results from yesterday still have not received will try later

## 2014-01-16 NOTE — Telephone Encounter (Signed)
S/w Katie at Dr. Arcola Jansky office still waiting for lab results to be faxed over stated will be sent right now

## 2014-01-17 NOTE — Progress Notes (Signed)
Called patient's daughter - was told that she was admitted back to [facility] - had bleeding from the tube with hgb down to 5. Was transfused. Also with fever. Now on antibiotics.   I asked her daughter to call us and give [place] an update when she is discharged.   Coumadin has been stopped.

## 2014-01-18 LAB — URINE CULTURE: Colony Count: >=100000

## 2014-01-22 ENCOUNTER — Ambulatory Visit: Payer: Medicare Other | Admitting: Nurse Practitioner

## 2014-01-22 ENCOUNTER — Telehealth: Payer: Self-pay | Admitting: Nurse Practitioner

## 2014-01-22 NOTE — Telephone Encounter (Signed)
Pt scheduled to see Dr Burt Knack 01/23/14.

## 2014-01-22 NOTE — Telephone Encounter (Signed)
Left message for [name] to call the office about appointment for [name].

## 2014-01-22 NOTE — Telephone Encounter (Signed)
Theodosia Quay, NP aware pt needs appt.

## 2014-01-22 NOTE — Telephone Encounter (Signed)
lmovm Truitt Merle, NP, had a cancellation today at 1:30 lm to see of pt wanted to come in today waiting to here back also made Theodosia Quay, RN aware

## 2014-01-22 NOTE — Telephone Encounter (Signed)
New message     Just got message for appt today----cannot get here that early.   Please resc appt.

## 2014-01-22 NOTE — Telephone Encounter (Signed)
New message    Daughter calling back to speak with nurse.

## 2014-01-22 NOTE — Telephone Encounter (Signed)
S/w pt's daughter pt got out of the hospital yesterday had sepsis from kidney infection pt is now on Cipro for 14 days and needs to be seen this week for EKG due to antibiotic and rhythm issues, is going to back to have kidney surgery on June 8th.  Cecille Rubin does not have anything available stated pt needs to be seen by Dr. Burt Knack. Will route to Theodosia Quay, RN, Dr. Antionette Char nurse and have Lauren call pt's daughter back.

## 2014-01-23 ENCOUNTER — Encounter: Payer: Self-pay | Admitting: Cardiovascular Disease

## 2014-01-23 ENCOUNTER — Ambulatory Visit (INDEPENDENT_AMBULATORY_CARE_PROVIDER_SITE_OTHER): Payer: Medicare Other | Admitting: Cardiovascular Disease

## 2014-01-23 VITALS — BP 100/70 | HR 65 | Ht 62.0 in | Wt 112.0 lb

## 2014-01-23 DIAGNOSIS — D649 Anemia, unspecified: Secondary | ICD-10-CM

## 2014-01-23 DIAGNOSIS — I4891 Unspecified atrial fibrillation: Secondary | ICD-10-CM

## 2014-01-23 LAB — CBC
HCT: 28.1 % — ABNORMAL LOW (ref 36.0–46.0)
HEMOGLOBIN: 9.1 g/dL — AB (ref 12.0–15.0)
MCHC: 32.3 g/dL (ref 30.0–36.0)
MCV: 87.7 fl (ref 78.0–100.0)
Platelets: 420 10*3/uL — ABNORMAL HIGH (ref 150.0–400.0)
RBC: 3.2 Mil/uL — ABNORMAL LOW (ref 3.87–5.11)
RDW: 15.9 % — AB (ref 11.5–15.5)

## 2014-01-23 NOTE — Patient Instructions (Signed)
Your physician recommends that you schedule a follow-up appointment in: 8 WEEKS with Truitt Merle NP  Your physician recommends that you have lab work today: CBC  Your physician recommends that you continue on your current medications as directed. Please refer to the Current Medication list given to you today.

## 2014-01-27 ENCOUNTER — Encounter: Payer: Self-pay | Admitting: Cardiovascular Disease

## 2014-01-27 NOTE — Progress Notes (Signed)
HPI: Ms Bednarik is a 73 year old female with a very complex history. She had a prolonged hospitalization back in 2014 with respiratory failure from pneumonia and sepsis. This was associated with a NSTEMI and found to have severe LV dysfunction with EF of 20 to 25%. Cath showed NO obstructive CAD - felt to have a stress induced CM. Did have atrial fib and was put on amiodarone and Xarelto but had difficulty with anemia and guaiac positive stools. Her Xarelto was stopped and she was kept on aspirin. Her EF did recover and was 50 to 55% per echo in January of 2015.  Her other issues include HLD, GERD, and HTN.  She's recently been hospitalized with kidney stones and required a percutaneous nephrostomy tube. She was placed on cipro for pyelonephritis and was asked to follow-up closely for monitoring of QT interval considering concomitant use of amiodarone.   She feels weak, no specific cardiac complaints. No chest pain, dyspnea, or edema. She is scheduled to return for another urologic surgical procedure in the next few weeks. She was also noted to have a HgB of 5 during admission  Outpatient Encounter Prescriptions as of 01/23/2014  Medication Sig  . amiodarone (PACERONE) 200 MG tablet Take 1 tablet (200 mg total) by mouth daily.  Marland Kitchen atorvastatin (LIPITOR) 10 MG tablet Take 1 tablet (10 mg total) by mouth daily.  . carvedilol (COREG) 6.25 MG tablet Take 1 tablet (6.25 mg total) by mouth 2 (two) times daily with a meal.  . ciprofloxacin (CIPRO) 750 MG tablet Take 750 mg by mouth daily.  . cycloSPORINE (RESTASIS) 0.05 % ophthalmic emulsion 1 drop 2 (two) times daily.  Marland Kitchen docusate sodium (COLACE) 100 MG capsule Take 100 mg by mouth 2 (two) times daily.  Marland Kitchen estrogens, conjugated, (PREMARIN) 0.3 MG tablet Take 0.3 mg by mouth daily. Take daily for 21 days then do not take for 7 days.  Marland Kitchen FLUoxetine (PROZAC) 40 MG capsule Take 40 mg by mouth daily.  . furosemide (LASIX) 20 MG tablet Take 20 mg by mouth  daily.  . Multiple Vitamins-Minerals (PRESERVISION AREDS PO) Take by mouth.  Marland Kitchen omeprazole (PRILOSEC) 20 MG capsule Take 20 mg by mouth daily.   Marland Kitchen oxyCODONE (OXY IR/ROXICODONE) 5 MG immediate release tablet Take 2.5 mg by mouth every 4 (four) hours as needed for severe pain. As directed  . Probiotic Product (PHILLIPS COLON HEALTH PO) Take 1 tablet by mouth daily.  . tamsulosin (FLOMAX) 0.4 MG CAPS capsule Take 0.4 mg by mouth daily.  . Zinc 50 MG TABS Take by mouth as directed.  . [DISCONTINUED] warfarin (COUMADIN) 1 MG tablet Take 4 mg by mouth daily.    Allergies  Allergen Reactions  . Codeine Other (See Comments)    Reaction unknown     Past Medical History  Diagnosis Date  . GERD (gastroesophageal reflux disease)   . Hypercholesterolemia   . Diabetes mellitus   . Hypertension   . Bursitis   . FHx: migraine headaches   . Renal lithiasis   . 10 year risk of MI or stroke 7.5% or greater   . Takotsubo cardiomyopathy 06/2013    ROS: Negative except as per HPI  BP 100/70  Pulse 65  Ht 5\' 2"  (1.575 m)  Wt 50.803 kg (112 lb)  BMI 20.48 kg/m2  PHYSICAL EXAM: Pt is alert and oriented, frail appearing woman in NAD HEENT: normal Neck: JVP - normal, carotids 2+= without bruits Lungs: CTA bilaterally CV: RRR  without murmur or gallop Abd: soft, NT, Positive BS, no hepatomegaly Ext: no C/C/E, distal pulses intact and equal Skin: warm/dry no rash  EKG:  NSR 65 bpm, QT 472 ms, QTc 490 ms  ASSESSMENT AND PLAN: 1. Paroxysmal atrial fibrillation. Maintaining sinus rhythm on amiodarone once daily. QT is ok with amio/cipro. No anticoagulation. She's had 2 episodes of major bleeding now on anticoagulation and I would not be in favor of resuming warfarin or a NOAC. This patient is very frail in my opinion. She asked about aspirin but there is minimal benefit and I think it would be best to stay off of antiplatelet or anticoagulant drugs for the near future.   2. Stress induced  cardiomyopathy. No evidence of volume overload and LVEF remains within normal limits on recent echo study.  3. Anemia - will repeat a CBC since patient continues to feel very weak.  For follow-up, will arrange a visit with Truitt Merle in 2 months.   Sherren Mocha 01/27/2014 8:53 PM

## 2014-01-29 ENCOUNTER — Other Ambulatory Visit: Payer: Self-pay

## 2014-01-29 DIAGNOSIS — Z862 Personal history of diseases of the blood and blood-forming organs and certain disorders involving the immune mechanism: Secondary | ICD-10-CM

## 2014-03-13 ENCOUNTER — Ambulatory Visit (INDEPENDENT_AMBULATORY_CARE_PROVIDER_SITE_OTHER): Payer: Medicare Other | Admitting: Nurse Practitioner

## 2014-03-13 ENCOUNTER — Encounter: Payer: Self-pay | Admitting: Nurse Practitioner

## 2014-03-13 VITALS — BP 120/70 | HR 58 | Ht 62.0 in | Wt 112.4 lb

## 2014-03-13 DIAGNOSIS — M791 Myalgia, unspecified site: Secondary | ICD-10-CM

## 2014-03-13 DIAGNOSIS — I480 Paroxysmal atrial fibrillation: Secondary | ICD-10-CM

## 2014-03-13 DIAGNOSIS — IMO0001 Reserved for inherently not codable concepts without codable children: Secondary | ICD-10-CM

## 2014-03-13 DIAGNOSIS — R945 Abnormal results of liver function studies: Secondary | ICD-10-CM

## 2014-03-13 DIAGNOSIS — Z79899 Other long term (current) drug therapy: Secondary | ICD-10-CM

## 2014-03-13 DIAGNOSIS — R7989 Other specified abnormal findings of blood chemistry: Secondary | ICD-10-CM

## 2014-03-13 DIAGNOSIS — I4891 Unspecified atrial fibrillation: Secondary | ICD-10-CM

## 2014-03-13 LAB — BASIC METABOLIC PANEL
BUN: 19 mg/dL (ref 6–23)
CO2: 26 mEq/L (ref 19–32)
Calcium: 9 mg/dL (ref 8.4–10.5)
Chloride: 105 mEq/L (ref 96–112)
Creatinine, Ser: 1.2 mg/dL (ref 0.4–1.2)
GFR: 48.52 mL/min — ABNORMAL LOW (ref >60.00)
Glucose, Bld: 146 mg/dL — ABNORMAL HIGH (ref 70–99)
Potassium: 3.7 mEq/L (ref 3.5–5.1)
Sodium: 137 mEq/L (ref 135–145)

## 2014-03-13 LAB — CBC
HCT: 29.5 % — ABNORMAL LOW (ref 36.0–46.0)
Hemoglobin: 9.8 g/dL — ABNORMAL LOW (ref 12.0–15.0)
MCHC: 33.3 g/dL (ref 30.0–36.0)
MCV: 86.1 fl (ref 78.0–100.0)
Platelets: 233 10*3/uL (ref 150.0–400.0)
RBC: 3.43 Mil/uL — ABNORMAL LOW (ref 3.87–5.11)
RDW: 16.1 % — ABNORMAL HIGH (ref 11.5–15.5)
WBC: 9.9 10*3/uL (ref 4.0–10.5)

## 2014-03-13 LAB — TSH: TSH: 1.31 u[IU]/mL (ref 0.35–4.50)

## 2014-03-13 LAB — HEPATIC FUNCTION PANEL
ALT: 44 U/L — ABNORMAL HIGH (ref 0–35)
AST: 84 U/L — ABNORMAL HIGH (ref 0–37)
Albumin: 3 g/dL — ABNORMAL LOW (ref 3.5–5.2)
Alkaline Phosphatase: 228 U/L — ABNORMAL HIGH (ref 39–117)
Bilirubin, Direct: 0 mg/dL (ref 0.0–0.3)
Total Bilirubin: 0.5 mg/dL (ref 0.2–1.2)
Total Protein: 6.6 g/dL (ref 6.0–8.3)

## 2014-03-13 NOTE — Patient Instructions (Addendum)
Stop Aspirin  Stay on your current medicines but stop the Lipitor  Keep up the walking  We will check labs today.   Tell the GI doctor that I have stopped the Lipitor today  See me in 3 months  Call the [place] office at [phone] if you have any questions, problems or concerns.

## 2014-03-13 NOTE — Progress Notes (Signed)
[name] Date of Birth: 1941-02-17 Medical Record [id number]  History of Present Illness: [name] is seen back today for an 8 week check. Seen for Dr. Burt [name]. She is a 73 year old female with a very complex history. She had a prolonged hospitalization back in 2014 with respiratory failure from pneumonia and sepsis. This was associated with a NSTEMI and found to have severe LV dysfunction with EF of 20 to 25%. Cath showed NO obstructive CAD - felt to have a stress induced CM. Did have atrial fib and was put on amiodarone and Xarelto but had difficulty with anemia and guaiac positive stools. Her Xarelto was stopped and she was kept on aspirin. Her EF did recover and was 50 to 55% per echo in January of 2015.   Her other issues include HLD, GERD, and HTN.   She's recently been hospitalized with kidney stones and required a percutaneous nephrostomy tube. She was placed on cipro for pyelonephritis and was asked to follow-up closely for monitoring of QT interval considering concomitant use of amiodarone. She was also noted to have a HgB of 5 during admission.   Saw Dr. Burt [name] 2 months ago - he recommended NOT restarting her anticoagulation nor NOAC given that she has had 2 major bleeds on anticoagulation. Also advised to stay off antiplatelet therapy as well.   Comes back today. Here with her daughter.  Looks stronger. Has just started walking more - does get short of breath when she is finished. No chest pain. No bleeding. Has lots of bruises on her - still taking her aspirin. Rhythm ok. Mostly limited by bilateral hip pain - wonders if it is from her Lipitor - has had her dose cut back in the past. Also reports that she has continued to have elevated LFTs  - seeing GI later today. Reports no recent labs.   Current Outpatient Prescriptions  Medication Sig Dispense Refill  . amiodarone (PACERONE) 200 MG tablet Take 1 tablet (200 mg total) by mouth daily.      Marland Kitchen aspirin 81 MG tablet Take  81 mg by mouth daily.      Marland Kitchen atorvastatin (LIPITOR) 10 MG tablet Take 1 tablet (10 mg total) by mouth daily.  30 tablet  11  . carvedilol (COREG) 6.25 MG tablet Take 1 tablet (6.25 mg total) by mouth 2 (two) times daily with a meal.  60 tablet  11  . cycloSPORINE (RESTASIS) 0.05 % ophthalmic emulsion 1 drop 2 (two) times daily.      Marland Kitchen docusate sodium (COLACE) 100 MG capsule Take 100 mg by mouth 2 (two) times daily.      Marland Kitchen FLUoxetine (PROZAC) 40 MG capsule Take 40 mg by mouth daily.      . furosemide (LASIX) 20 MG tablet Take 20 mg by mouth as needed.       . Multiple Vitamins-Minerals (PRESERVISION AREDS PO) Take by mouth.      Marland Kitchen omeprazole (PRILOSEC) 20 MG capsule Take 20 mg by mouth daily.       Marland Kitchen oxyCODONE (OXY IR/ROXICODONE) 5 MG immediate release tablet Take 2.5 mg by mouth every 4 (four) hours as needed for severe pain. As directed      . Probiotic Product (PHILLIPS COLON HEALTH PO) Take 1 tablet by mouth daily.      . Zinc 50 MG TABS Take by mouth as directed.       No current facility-administered medications for this visit.    Allergies  Allergen  Reactions  . Codeine Other (See Comments)    Reaction unknown     Past Medical History  Diagnosis Date  . GERD (gastroesophageal reflux disease)   . Hypercholesterolemia   . Diabetes mellitus   . Hypertension   . Bursitis   . FHx: migraine headaches   . Renal lithiasis   . 10 year risk of MI or stroke 7.5% or greater   . 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 Partial hysterectomy    . Hip surgery      right  . Cholecystectomy    . Esophagogastroduodenoscopy  06/12/2007    [id number] esophagus/patulous EG junction, moderate sized  hiatal hernia, otherwise normal stomach, D1, D2  . Colonoscopy  06/15/2005    RMR: Minimal internal hemorrhoids. Otherwise normal rectum/Normal colon  . Colonoscopy    12/14/2001    RMR: Internal hemorrhage, otherwise, normal rectum/ Polyps at 30  and 35 cm resected; the remainder of colonic mucosa appeared normal  . Colonoscopy  05/2009    RMR: minimal hemorrhoids, sigmoid hyperplastic polyp  . Colonoscopy N/A 10/24/2013    [id number] colonic polyps removed as described above/Colonic diverticulosis  . Esophagogastroduodenoscopy N/A 10/24/2013    RMR: Small hiatal hernia; otherwise, normal EGD    History  Smoking status  . Former Smoker -- 1.00 packs/day for 43 years  . Types: Cigarettes  . Start date: 08/31/1959  . Quit date: 08/30/2002  Smokeless tobacco  . Never Used    Comment: Quit x 10 years    History  Alcohol Use No    Family History  Problem Relation Age of Onset  . Pancreatic cancer Father 67  . Colon cancer Mother     late 18s    Review of Systems: The review of systems is per the HPI.  All other systems were reviewed and are negative.  Physical Exam: Ht 5\' 2"  (1.575 m)  Wt 112 lb 6.4 oz (50.984 kg)  BMI 20.55 kg/m2 Patient is very pleasant and in no acute distress. Skin is warm and dry. Color is normal.  HEENT is unremarkable. Normocephalic/atraumatic. PERRL. Sclera are nonicteric. Neck is supple. No masses. No JVD. Lungs are clear. Cardiac exam shows a regular rate and rhythm. Abdomen is soft. Extremities are without edema. Gait and ROM are intact. No gross neurologic deficits noted.  Wt Readings from Last 3 Encounters:  03/13/14 112 lb 6.4 oz (50.984 kg)  01/23/14 112 lb (50.803 kg)  01/15/14 113 lb 3.2 oz (51.347 kg)    LABORATORY DATA/PROCEDURES:  Lab Results  Component Value Date   WBC 18.4 Repeated and verified X2.* 01/23/2014   HGB 9.1* 01/23/2014   HCT 28.1* 01/23/2014   PLT 420.0* 01/23/2014   GLUCOSE 103* 09/05/2013   ALT 38* 07/02/2013   AST 27 07/02/2013   NA 139 09/05/2013   K 4.5 09/05/2013   CL 104 09/05/2013   CREATININE 1.5* 09/05/2013   BUN 24* 09/05/2013   CO2 26 09/05/2013   INR 1.07 07/02/2013    BNP (last 3 results)  Recent Labs  07/02/13 0224  PROBNP 30181.0*   Echo Study  Conclusions from January 2015  - Left ventricle: The cavity size was normal. Systolic function was normal. The estimated ejection fraction was in the range of 55% to 60%. Wall motion was normal; there were no regional wall motion abnormalities. - Left atrium: The atrium was mildly dilated.  ECHO SUMMARY MAY 2015  The left ventricular size  is normal.There is mild concentric left ventricular  hypertrophy.Left ventricular systolic function is low normal.LV ejection  fraction = 50-55%.No segmental wall motion abnormalities seen in the left  ventricle.  The right ventricle is normal in size and function.  The left atrium is mildly dilated.  There is mild mitral regurgitation.  Mild pulmonary hypertension.Estimated right ventricular systolic pressure is  39 mmHg.  Small pericardial effusion.  There is no comparison study available.   Reading Physician:  [place], [place], [id number] 01/11/2014 12:24 PM     PULMONARY VENTILATION PERFUSION (V/Q) SCAN, Jan 10, 2014 06:59:04 PM . INDICATION: CHEST PAIN, PE SUSPECTED786.05 SOB (shortness of breath) 518.81 Acute respirator  COMPARISON: Chest x-ray dated 01/09/2014 . TECHNIQUE: 7.58mCi of Xenon-133 gas was administered via a rebreathing mask, with single breath, equilibrium, and washout images of the lungs obtained in LPO and RPO projections. Perfusion images of both lung fields in multiple projections were obtained following the intravenous administration of 5.5 mCi of Technetium-68m MAA.  Marland Kitchen FINDINGS: . Perfusion:  Small wedge-shaped photopenic defect in the posterior inferior left lung base best seen on posterior and RPO images. Nonsegmental photopenic defects in the medial aspects of both lungs on the LAO and RAO images. . . Ventilation  Ventilation pattern closely matches the perfusion pattern. The small left basilar defect is not seen, however small perfusion defects are typically without correlate on the ventilation images due to intrinsic  limitations of ventilation imaging. No focal gas trapping is evident. .  CONCLUSION:  Very low likelihood ratio for pulmonary embolus by modified PIOPED 2 criteria.     Assessment / Plan:   1. Malaise and fatigue post GU tube placement/GU surgery -  She is getting stronger.   2. PAF - back on amiodarone - needs surveillance labs checked today -  Remains in sinus by exam today.   3. Past stress induced CM - EF has recovered.   4. Reported increased LFTs - I am stopping her Lipitor today.    5. Past anemia/bruises - stop the aspirin. Check labs  6. Hip pain - limited in what she can take - will talk with PCP - stopping statin today as well.  I will see back in 3 months.   Patient is agreeable to this plan and will call if any problems develop in the interim.   Burtis Junes, RN, [place] [address] [place], [place]  [address] [phone]

## 2014-03-20 ENCOUNTER — Ambulatory Visit: Payer: Medicare Other | Admitting: Nurse Practitioner

## 2014-04-01 ENCOUNTER — Telehealth: Payer: Self-pay | Admitting: Nurse Practitioner

## 2014-04-01 NOTE — Telephone Encounter (Signed)
S/w pt's daughter pt is going to see PCP tonite at 89 daughter will call tomorrow with the outcome of office visit

## 2014-04-01 NOTE — Telephone Encounter (Signed)
New Prob    Pt recently fell about 1 week ago and has been experiencing new onset HA and high blood pressure. Daughter is concerns and requesting to speak to nurse. Please call.

## 2014-04-02 ENCOUNTER — Telehealth: Payer: Self-pay | Admitting: Cardiovascular Disease

## 2014-04-02 ENCOUNTER — Telehealth: Payer: Self-pay | Admitting: Nurse Practitioner

## 2014-04-02 NOTE — Telephone Encounter (Signed)
New message    Daughter calling  Asking for Rainey [place] to call her back will discuss the matter with her.

## 2014-04-02 NOTE — Telephone Encounter (Signed)
°  Patients daughter would like a call back regarding CT scan that was done at [facility]. Please call and advise.

## 2014-04-02 NOTE — Telephone Encounter (Signed)
S/w with daughter about ct scan.  Pt did not have any bleeding bp still high.  Stated to keep taking bp at different times twice a day and record and if bp does not come down in couple of weeks give [place] a call.  Just started on Lisinopril.  Pt is seeing PCP back in two weeks.

## 2014-04-02 NOTE — Telephone Encounter (Signed)
Pt's daughter called to [place] pt saw PCP last night and is going for a CT scan today.  PCP thinks pt has hematoma.  Started on Lisinopril ( 12.5 mg ) at PCP yesterday due to bp issues.  Pt's daughter will call with update on CT scan.

## 2014-05-01 ENCOUNTER — Telehealth: Payer: Self-pay | Admitting: Nurse Practitioner

## 2014-05-02 NOTE — Telephone Encounter (Signed)
Walk-In Patient form received from front reception with records attached from [facility]. Will give to Danielle G/Lori G on 9.8.15 when they return to the office:djc

## 2014-05-08 ENCOUNTER — Encounter: Payer: Self-pay | Admitting: Internal Medicine

## 2014-05-15 ENCOUNTER — Telehealth: Payer: Self-pay | Admitting: Nurse Practitioner

## 2014-05-15 NOTE — Telephone Encounter (Signed)
New message      Daughter brought MRI results from the GI doctor stating that patients liver is enlarged.  GI doctor thinks her heart medications may be causing this.  No one has called daughter regarding this.  Please call

## 2014-05-15 NOTE — Telephone Encounter (Signed)
Pt's daughter called, she said that 2 weeks ago she brought an MRI report to this office. The MRI was done at  [facility]. The Radiologist that read the results said that pt had an enlarged Liver, and he thinks that is the Amiodarone that is causing to be enlarged. The MD  asked for pt to bring the results to her Cardiologist. Daughter  has not heard anything  from this office.

## 2014-05-16 ENCOUNTER — Other Ambulatory Visit: Payer: Self-pay

## 2014-05-16 NOTE — Telephone Encounter (Signed)
Agree with plan 

## 2014-05-16 NOTE — Telephone Encounter (Signed)
Dr Burt Knack reviewed report again and he said that as long as LFT's are normal, would continue amiodarone.  I spoke with the pt's daughter and made her aware of this information. In reviewing the pt's chart her LFT's have been elevated.  I advised the daughter to have the pt stop amiodarone. They are currently out of town and will stop this medication when they get back home this weekend. The pt's daughter said that the pt has undergone testing by GI at [facility] and these records are available through [facility].  I will forward this message to Dr Burt Knack to review.

## 2014-06-03 ENCOUNTER — Other Ambulatory Visit: Payer: Self-pay

## 2014-06-03 DIAGNOSIS — Z1239 Encounter for other screening for malignant neoplasm of breast: Secondary | ICD-10-CM

## 2014-06-18 ENCOUNTER — Ambulatory Visit (INDEPENDENT_AMBULATORY_CARE_PROVIDER_SITE_OTHER): Payer: Medicare Other | Admitting: Nurse Practitioner

## 2014-06-18 ENCOUNTER — Encounter: Payer: Self-pay | Admitting: Nurse Practitioner

## 2014-06-18 VITALS — BP 128/76 | HR 66 | Ht 62.0 in | Wt 118.0 lb

## 2014-06-18 DIAGNOSIS — R509 Fever, unspecified: Secondary | ICD-10-CM

## 2014-06-18 DIAGNOSIS — R197 Diarrhea, unspecified: Secondary | ICD-10-CM

## 2014-06-18 DIAGNOSIS — I429 Cardiomyopathy, unspecified: Secondary | ICD-10-CM

## 2014-06-18 DIAGNOSIS — I428 Other cardiomyopathies: Secondary | ICD-10-CM

## 2014-06-18 NOTE — Progress Notes (Addendum)
[name] Date of Birth: [birth year] Medical Record [id number]  History of Present Illness: Ms. Rieb is seen back today for an 8 week check. Seen for Dr. Burt Knack. She is a 73 year old female with a very complex history. She had a prolonged hospitalization back in 2014 with respiratory failure from pneumonia and sepsis. This was associated with a NSTEMI and found to have severe LV dysfunction with EF of 20 to 25%. Cath showed NO obstructive CAD - felt to have a stress induced CM. Did have atrial fib and was put on amiodarone and Xarelto but had difficulty with anemia and guaiac positive stools. Her Xarelto was stopped and she was kept on aspirin. Her EF did recover and was 50 to 55% per echo in January of 2015.   Her other issues include HLD, GERD, and HTN. She has had lots of GU/GI issues this past summer - has had kidney stones - required a percutaneous nephrostomy tube - pyelonephritis - then elevated LFTs. Now off of amiodarone. Off of her statin. Has had prior GI bleed and is no longer on anticoagulation.   Comes in today. Here with her daughter. Pretty pitiful. Feels like "something is not right". Feels like her stomach is "about to burst". Low grade fevers and chills. Vomiting every day - looks like bile. Some diarrhea. Has had several UTI's - has UA in process now by her PCP. She can't eat. Weight is up a few pounds. Feels bloated. Not taking her Lasix. No chest pain. No palpitations. LFTs are improving. Going to see GU on Friday with plans for an ultrasound and visit. Family would really like to try and coordinate her care here in [place]. They did bring by an MRI last month - concerning for renal cell neoplasm.   Current Outpatient Prescriptions  Medication Sig Dispense Refill  . carvedilol (COREG) 6.25 MG tablet Take 1 tablet (6.25 mg total) by mouth 2 (two) times daily with a meal.  60 tablet  11  . FLUoxetine (PROZAC) 40 MG capsule Take 40 mg by mouth daily.      .  furosemide (LASIX) 20 MG tablet Take 20 mg by mouth as needed.       Marland Kitchen liothyronine (CYTOMEL) 25 MCG tablet Take 25 mcg by mouth daily.      . Multiple Vitamins-Minerals (PRESERVISION AREDS PO) Take by mouth.      Marland Kitchen omeprazole (PRILOSEC) 20 MG capsule Take 20 mg by mouth daily.       . Probiotic Product (PHILLIPS COLON HEALTH PO) Take 1 tablet by mouth daily.      . traMADol (ULTRAM) 50 MG tablet Take 50 mg by mouth every 6 (six) hours as needed.      . Zinc 50 MG TABS Take by mouth as directed.       No current facility-administered medications for this visit.    Allergies  Allergen Reactions  . Codeine Other (See Comments)    Reaction unknown     Past Medical History  Diagnosis Date  . GERD (gastroesophageal reflux disease)   . Hypercholesterolemia   . Diabetes mellitus   . Hypertension   . Bursitis   . FHx: migraine headaches   . Renal lithiasis   . 10 year risk of MI or stroke 7.5% or greater   . 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 Partial hysterectomy    . Hip surgery      right  .  Cholecystectomy    . Esophagogastroduodenoscopy  06/12/2007    [id number] esophagus/patulous EG junction, moderate sized  hiatal hernia, otherwise normal stomach, D1, D2  . Colonoscopy  06/15/2005    RMR: Minimal internal hemorrhoids. Otherwise normal rectum/Normal colon  . Colonoscopy    12/14/2001    RMR: Internal hemorrhage, otherwise, normal rectum/ Polyps at 30 and 35 cm resected; the remainder of colonic mucosa appeared normal  . Colonoscopy  05/2009    RMR: minimal hemorrhoids, sigmoid hyperplastic polyp  . Colonoscopy N/A 10/24/2013    [id number] colonic polyps removed as described above/Colonic diverticulosis  . Esophagogastroduodenoscopy N/A 10/24/2013    RMR: Small hiatal hernia; otherwise, normal EGD    History  Smoking status  . Former Smoker -- 1.00 packs/day for 43 years  . Types: Cigarettes  . Start date:  08/31/1959  . Quit date: 08/30/2002  Smokeless tobacco  . Never Used    Comment: Quit x 10 years    History  Alcohol Use No    Family History  Problem Relation Age of Onset  . Pancreatic cancer Father 69  . Colon cancer Mother     late 51s    Review of Systems: The review of systems is per the HPI.  All other systems were reviewed and are negative.  Physical Exam: BP 128/76  Pulse 66  Ht 5\' 2"  (1.575 m)  Wt 118 lb (53.524 kg)  BMI 21.58 kg/m2 Patient is alert and in no acute distress but looks chronically ill. Skin is warm and dry. Color is normal.  HEENT is unremarkable. Normocephalic/atraumatic. PERRL. Sclera are nonicteric. Neck is supple. No masses. No JVD. Lungs are clear. Cardiac exam shows a regular rate and rhythm. Abdomen is distended but soft. Extremities are without edema. Gait and ROM are intact. No gross neurologic deficits noted.  Wt Readings from Last 3 Encounters:  06/18/14 118 lb (53.524 kg)  03/13/14 112 lb 6.4 oz (50.984 kg)  01/23/14 112 lb (50.803 kg)    LABORATORY DATA/PROCEDURES:  Lab Results  Component Value Date   WBC 9.9 03/13/2014   HGB 9.8* 03/13/2014   HCT 29.5* 03/13/2014   PLT 233.0 03/13/2014   GLUCOSE 146* 03/13/2014   ALT 44* 03/13/2014   AST 84* 03/13/2014   NA 137 03/13/2014   K 3.7 03/13/2014   CL 105 03/13/2014   CREATININE 1.2 03/13/2014   BUN 19 03/13/2014   CO2 26 03/13/2014   TSH 1.31 03/13/2014   INR 1.07 07/02/2013    BNP (last 3 results)  Recent Labs  07/02/13 0224  PROBNP 30181.0*    Echo Study Conclusions from January 2015  - Left ventricle: The cavity size was normal. Systolic function was normal. The estimated ejection fraction was in the range of 55% to 60%. Wall motion was normal; there were no regional wall motion abnormalities. - Left atrium: The atrium was mildly dilated.  ECHO SUMMARY MAY 2015  The left ventricular size is normal.There is mild concentric left ventricular  hypertrophy.Left ventricular  systolic function is low normal.LV ejection  fraction = 50-55%.No segmental wall motion abnormalities seen in the left  ventricle.  The right ventricle is normal in size and function.  The left atrium is mildly dilated.  There is mild mitral regurgitation.  Mild pulmonary hypertension.Estimated right ventricular systolic pressure is  39 mmHg.  Small pericardial effusion.  There is no comparison study available.  Reading Physician:  [place]pper, [place], [id number] 01/11/2014 12:24 PM      Assessment /  Plan:  1. Multiple somatic complaints with vomiting, fever, chills and abdominal pain - abnormal MRI - to see GU on Friday - may need to try and establish with Dr. Rosana Hoes since he comes to [facility]. I am checking labs today and will also check a stool for CDiff - she has had multiple antibiotics - she is agreeable. I do not feel that her current status is cardiac related but she clearly has something medical going on.   2. PAF - off of amiodarone - - Remains in sinus by exam today. Not a candidate for anticoagulation due to prior bleeding  3. Past stress induced CM - EF has recovered per last echo in January of 2015.   4. Elevated LFTs - off of statin and amiodarone  Patient is agreeable to this plan and will call if any problems develop in the interim.   Burtis Junes, RN, [place] [address] [place], [place]  [address] [phone]   Addendum from 06/24/2014 thru MyChart  "Hello this is [name], her daughter, I was told to let you know what is going on with my mother doctor appointment at [facility] Friday June 21, 2014. Mom has a lot going on, she has two kidney stones that need to be removed , doctor is scheduling a Lipotripsy on July 01, 2014. Also because of the significant swelling she is having in abdomen she needs to see a GI doctor ASAP, they should be sending a referral out soon. If you would like to contact me for  any other information you can [phone]. [name]"

## 2014-06-18 NOTE — Patient Instructions (Addendum)
We will be checking the following labs today BMET, CBC, HPF and stool for c diff  Take your Lasix for the next several days  Let us know what happens on Friday  See if you can get established with Dr. Lawerance Bach - he is a urologist at [facility] that comes to [facility]  Call the [place] office at [phone] if you have any questions, problems or concerns.

## 2014-06-19 LAB — BASIC METABOLIC PANEL
BUN: 18 mg/dL (ref 6–23)
CO2: 23 mEq/L (ref 19–32)
Calcium: 8.7 mg/dL (ref 8.4–10.5)
Chloride: 106 mEq/L (ref 96–112)
Creatinine, Ser: 1.4 mg/dL — ABNORMAL HIGH (ref 0.4–1.2)
GFR: 39.35 mL/min — ABNORMAL LOW (ref >60.00)
Glucose, Bld: 94 mg/dL (ref 70–99)
Potassium: 4 mEq/L (ref 3.5–5.1)
Sodium: 140 mEq/L (ref 135–145)

## 2014-06-19 LAB — HEPATIC FUNCTION PANEL
ALT: 32 U/L (ref 0–35)
AST: 98 U/L — ABNORMAL HIGH (ref 0–37)
Albumin: 2.4 g/dL — ABNORMAL LOW (ref 3.5–5.2)
Alkaline Phosphatase: 190 U/L — ABNORMAL HIGH (ref 39–117)
Bilirubin, Direct: 0.2 mg/dL (ref 0.0–0.3)
Total Bilirubin: 1.1 mg/dL (ref 0.2–1.2)
Total Protein: 6.1 g/dL (ref 6.0–8.3)

## 2014-06-19 LAB — CBC
HCT: 29.7 % — ABNORMAL LOW (ref 36.0–46.0)
Hemoglobin: 9.6 g/dL — ABNORMAL LOW (ref 12.0–15.0)
MCHC: 32.2 g/dL (ref 30.0–36.0)
MCV: 82.8 fl (ref 78.0–100.0)
Platelets: 201 10*3/uL (ref 150.0–400.0)
RBC: 3.59 Mil/uL — ABNORMAL LOW (ref 3.87–5.11)
RDW: 17 % — ABNORMAL HIGH (ref 11.5–15.5)
WBC: 10.2 10*3/uL (ref 4.0–10.5)

## 2014-06-20 ENCOUNTER — Telehealth: Payer: Self-pay | Admitting: *Deleted

## 2014-06-20 ENCOUNTER — Telehealth: Payer: Self-pay | Admitting: Nurse Practitioner

## 2014-06-20 NOTE — Telephone Encounter (Signed)
New message ° ° ° ° °Want lab results °

## 2014-06-20 NOTE — Telephone Encounter (Signed)
Error

## 2014-06-24 ENCOUNTER — Encounter (HOSPITAL_COMMUNITY): Payer: Self-pay | Admitting: Cardiovascular Disease

## 2014-07-05 NOTE — Telephone Encounter (Signed)
Follow up    Daughter calling has some updates information for Cecille Rubin- Liver Diease stage 3.

## 2014-07-05 NOTE — Telephone Encounter (Signed)
S/w pt's daughter wanted to let Cecille Rubin know pt has Stage III Liver Disease per GI doctor.  Pt's daughter stated Dr stated it was from long term diabetes and Long term high cholesterol.  Pt's daughter wanted me to explain about the liver disease I advised pt's daughter to call GI doctor.  Pt is having endo on Tuesday and MRI on the 21st.  Pt daughter stated pt's weight was up 2 lbs. I will send to Truitt Merle, NP, to [facility].

## 2014-07-18 ENCOUNTER — Other Ambulatory Visit: Payer: Self-pay | Admitting: Physician Assistant

## 2014-08-02 ENCOUNTER — Ambulatory Visit: Payer: Medicare Other

## 2014-08-08 ENCOUNTER — Encounter (HOSPITAL_COMMUNITY): Payer: Self-pay | Admitting: Cardiovascular Disease

## 2014-09-05 ENCOUNTER — Other Ambulatory Visit (HOSPITAL_COMMUNITY): Payer: Self-pay | Admitting: Family Medicine

## 2014-09-05 DIAGNOSIS — M810 Age-related osteoporosis without current pathological fracture: Secondary | ICD-10-CM

## 2014-09-05 DIAGNOSIS — Z1231 Encounter for screening mammogram for malignant neoplasm of breast: Secondary | ICD-10-CM

## 2014-09-09 ENCOUNTER — Ambulatory Visit (HOSPITAL_COMMUNITY)
Admission: RE | Admit: 2014-09-09 | Discharge: 2014-09-09 | Disposition: A | Discharge status: Home / Self Care | Payer: Medicare Other | Source: Ambulatory Visit | Attending: Family Medicine | Admitting: Family Medicine

## 2014-09-09 DIAGNOSIS — Z1231 Encounter for screening mammogram for malignant neoplasm of breast: Secondary | ICD-10-CM | POA: Diagnosis present

## 2014-09-10 ENCOUNTER — Ambulatory Visit (HOSPITAL_COMMUNITY)
Admission: RE | Admit: 2014-09-10 | Discharge: 2014-09-10 | Disposition: A | Discharge status: Home / Self Care | Payer: Medicare Other | Source: Ambulatory Visit | Attending: Family Medicine | Admitting: Family Medicine

## 2014-09-10 DIAGNOSIS — M810 Age-related osteoporosis without current pathological fracture: Secondary | ICD-10-CM | POA: Insufficient documentation

## 2014-09-17 ENCOUNTER — Other Ambulatory Visit: Payer: Self-pay | Admitting: Physician Assistant

## 2015-01-08 ENCOUNTER — Encounter: Payer: Self-pay | Admitting: Nurse Practitioner

## 2015-01-08 ENCOUNTER — Ambulatory Visit (INDEPENDENT_AMBULATORY_CARE_PROVIDER_SITE_OTHER): Payer: Medicare Other | Admitting: Nurse Practitioner

## 2015-01-08 VITALS — BP 120/66 | HR 64 | Ht 62.0 in | Wt 108.8 lb

## 2015-01-08 DIAGNOSIS — I5022 Chronic systolic (congestive) heart failure: Secondary | ICD-10-CM | POA: Diagnosis not present

## 2015-01-08 DIAGNOSIS — Z79899 Other long term (current) drug therapy: Secondary | ICD-10-CM | POA: Diagnosis not present

## 2015-01-08 DIAGNOSIS — I5032 Chronic diastolic (congestive) heart failure: Secondary | ICD-10-CM | POA: Diagnosis not present

## 2015-01-08 NOTE — Progress Notes (Signed)
CARDIOLOGY OFFICE NOTE  Date:  01/08/2015    [name] Date of Birth: [birth year] Medical Record [id number]  PCP:  [facility]  Cardiologist:  Burt Knack    Chief Complaint  Patient presents with  . Coronary Artery Disease    Follow up visit - seen for Dr. Burt Knack    History of Present Illness: [name] is a 74 y.o. female who presents today for a follow up visit. Seen for Dr. Burt Knack. She has a very complex history. She had a prolonged hospitalization back in 2014 with respiratory failure from pneumonia and sepsis. This was associated with a NSTEMI and found to have severe LV dysfunction with EF of 20 to 25%. Cath showed NO obstructive CAD - felt to have a stress induced CM. Did have atrial fib and was put on amiodarone and Xarelto but had difficulty with anemia and guaiac positive stools. Her Xarelto was stopped and she was kept on aspirin. Her EF did recover and was 50 to 55% per echo in January of 2015 and 55% in May of 2015.   Her other issues include HLD, GERD, and HTN. She has had lots of GU/GI issues last summer - has had kidney stones - required a percutaneous nephrostomy tube - pyelonephritis - then elevated LFTs. Taken off of amiodarone. Off of her statin. Has had prior GI bleed and is no longer on any form of anticoagulation.   I last saw her in October - she was not doing well. Seemed to be having more GI issues. Her cardiac status was felt to be stable. There was concern for a renal cell carcinoma.   Recent admission in April at [facility] - no records available - apparently her potassium was high - was 6.4. Had been on aldactone by the liver doctor. Still with a "spot on her kidney" but family has decided to "leave alone". For repeat lab by [facility] tomorrow. Was switched to Lasix from aldactone.   Comes in today. Here with her daughter. Says she is doing ok. Swelling in her abdomen has been controlled with the Lasix. Off aldactone now. Lab tomorrow.  No chest pain. No real dizziness. Will have some in the early morning. No passing out. Rhythm has been good. Has good family support - daughter sees her every day.   Past Medical History  Diagnosis Date  . GERD (gastroesophageal reflux disease)   . Hypercholesterolemia   . Diabetes mellitus   . Hypertension   . Bursitis   . FHx: migraine headaches   . Renal lithiasis   . 10 year risk of MI or stroke 7.5% or greater   . 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 Partial hysterectomy    . Hip surgery      right  . Cholecystectomy    . Esophagogastroduodenoscopy  06/12/2007    [id number] esophagus/patulous EG junction, moderate sized  hiatal hernia, otherwise normal stomach, D1, D2  . Colonoscopy  06/15/2005    RMR: Minimal internal hemorrhoids. Otherwise normal rectum/Normal colon  . Colonoscopy    12/14/2001    RMR: Internal hemorrhage, otherwise, normal rectum/ Polyps at 30 and 35 cm resected; the remainder of colonic mucosa appeared normal  . Colonoscopy  05/2009    RMR: minimal hemorrhoids, sigmoid hyperplastic polyp  . Colonoscopy N/A 10/24/2013    [id number] colonic polyps removed as described above/Colonic diverticulosis  . Esophagogastroduodenoscopy N/A 10/24/2013    RMR: Small hiatal  hernia; otherwise, normal EGD  . Left heart catheterization with coronary angiogram N/A 07/02/2013    Procedure: LEFT HEART CATHETERIZATION WITH CORONARY ANGIOGRAM;  Surgeon: Josue Hector, MD;  Location: [facility] CATH LAB;  Service: Cardiovascular;  Laterality: N/A;     Medications: Current Outpatient Prescriptions  Medication Sig Dispense Refill  . buPROPion (WELLBUTRIN SR) 150 MG 12 hr tablet Take 150 mg by mouth daily.    . carvedilol (COREG) 6.25 MG tablet TAKE ONE TABLET TWICE DAILY WITH A MEAL 60 tablet 5  . cholecalciferol (VITAMIN D) 1000 UNITS tablet Take 2,000 Units by mouth daily.    Marland Kitchen FLUoxetine (PROZAC) 40 MG capsule Take 40 mg by  mouth daily.    . furosemide (LASIX) 20 MG tablet TAKE ONE (1) TABLET EACH DAY 30 tablet 6  . Multiple Vitamins-Minerals (PRESERVISION AREDS PO) Take by mouth.    Marland Kitchen omeprazole (PRILOSEC) 20 MG capsule Take 20 mg by mouth daily.     . ondansetron (ZOFRAN) 4 MG tablet Take 4 mg by mouth every 8 (eight) hours as needed. One tab q 8 h prn for nausea, vomiting    . Probiotic Product (PHILLIPS COLON HEALTH PO) Take 1 tablet by mouth daily.    Marland Kitchen sulfamethoxazole-trimethoprim (BACTRIM,SEPTRA) 400-80 MG per tablet Take 1 tablet by mouth daily.    . traMADol (ULTRAM) 50 MG tablet Take 50 mg by mouth every 6 (six) hours as needed.    . traZODone (DESYREL) 50 MG tablet Take 50 mg by mouth at bedtime.     . Zinc 50 MG TABS Take by mouth as directed.     No current facility-administered medications for this visit.    Allergies: Allergies  Allergen Reactions  . Codeine Other (See Comments)    Reaction unknown     Social History: The patient  reports that she quit smoking about 12 years ago. Her smoking use included Cigarettes. She started smoking about 55 years ago. She has a 43 pack-year smoking history. She has never used smokeless tobacco. She reports that she does not drink alcohol or use illicit drugs.   Family History: The patient's family history includes Colon cancer in her mother; Pancreatic cancer (age of onset: 28) in her father.   Review of Systems: Please see the history of present illness.   Otherwise, the review of systems is positive for fatigue.   All other systems are reviewed and negative.   Physical Exam: VS:  BP 120/66 mmHg  Pulse 64  Ht 5\' 2"  (1.575 m)  Wt 108 lb 12.8 oz (49.351 kg)  BMI 19.89 kg/m2  SpO2 97% .  BMI Body mass index is 19.89 kg/(m^2).  Wt Readings from Last 3 Encounters:  01/08/15 108 lb 12.8 oz (49.351 kg)  06/18/14 118 lb (53.524 kg)  03/13/14 112 lb 6.4 oz (50.984 kg)    General: Pleasant. She remains quite thin. Looks chronically ill but in no  acute distress.  HEENT: Normal. Neck: Supple, no JVD, carotid bruits, or masses noted.  Cardiac: Regular rate and rhythm. No murmurs, rubs, or gallops. No edema.  Respiratory:  Lungs are clear to auscultation bilaterally with normal work of breathing.  GI: Soft and nontender.  MS: No deformity or atrophy. Gait and ROM intact. Skin: Warm and dry. Color is sallow. Eyes look a little jaundiced. Neuro:  Strength and sensation are intact and no gross focal deficits noted.  Psych: Alert, appropriate and with normal affect.   LABORATORY DATA:  EKG:  EKG is  not ordered today.  Lab Results  Component Value Date   WBC 10.2 06/18/2014   HGB 9.6* 06/18/2014   HCT 29.7* 06/18/2014   PLT 201.0 06/18/2014   GLUCOSE 94 06/18/2014   ALT 32 06/18/2014   AST 98* 06/18/2014   NA 140 06/18/2014   K 4.0 06/18/2014   CL 106 06/18/2014   CREATININE 1.4* 06/18/2014   BUN 18 06/18/2014   CO2 23 06/18/2014   TSH 1.31 03/13/2014   INR 1.07 07/02/2013    BNP (last 3 results) No results for input(s): BNP in the last 8760 hours.  ProBNP (last 3 results) No results for input(s): PROBNP in the last 8760 hours.   Other Studies Reviewed Today:  ECHO SUMMARY MAY 2015  The left ventricular size is normal.There is mild concentric left ventricular  hypertrophy.Left ventricular systolic function is low normal.LV ejection  fraction = 50-55%.No segmental wall motion abnormalities seen in the left  ventricle.  The right ventricle is normal in size and function.  The left atrium is mildly dilated.  There is mild mitral regurgitation.  Mild pulmonary hypertension.Estimated right ventricular systolic pressure is  39 mmHg.  Small pericardial effusion.  There is no comparison study available.  Reading Physician:  [place], [id number] 01/11/2014 12:24 PM    MR KIDNEY [place] (11/15/2014 5:50 PM) MR KIDNEY [place] (11/15/2014 5:50 PM)  Impressions      Cystic enhancing  lesion at the upper pole of right kidney remains concerning for cystic neoplasm. The size of the lesion is not significantly changed compared with study from 07/20/2014.     Minimally complex cyst interpolar left kidney. Attention on follow up.     Cirrhosis and portal hypertension.    MR KIDNEY WWO CONTRAST (11/15/2014 5:50 PM)  Narrative  MR ABDOMEN WITH AND WITHOUT CONTRAST, Nov 15, 2014 05:50:00 PM  .  INDICATION:ABDOMINAL PAIN NAUSEA, VOMITING, DIARRHEAQ61.00 Renal cyst R79.89 LFTs abnormal   COMPARISON: Renal ultrasound 08/09/2014. MRI 07/20/2014.  Marland Kitchen  TECHNIQUE: Multiplanar, multisequence MR images of the entire abdomen were obtained before and after intravenous administration of gadolinium-based contrast. 5 cc Multihance.     PROBLEM SPECIFIC FINDINGS:     Multiple simple and minimally complex T2 hyperintense cysts some with internal septations. Scarring left kidney. Hemorrhagic cyst in the upper pole left kidney.    T2 hyperintense cyst upper pole right kidney measuring 1.7 x 1.7 cm. Measuring at a similar level on prior this is essentially unchanged in size, from MRI dated 07/20/2014. This lesion contains areas of hemorrhage on the precontrast study. Lesion also demonstrates heterogeneous nodular enhancement on dynamic contrast enhanced sequences. The degree of nodular enhancement is slightly increased from prior. There is some restricted diffusion in the area of nodular enhancement.   Minimally complex cyst interpolar left kidney with a thin internal enhancing septation.         ADDITIONAL FINDINGS:  Lower Chest:  .Heart: Normal size. No pericardial effusion.  .Lungs/Pleura: No masses, consolidations, or effusions.   Vessels: Atherosclerosis of the abdominal aorta.  Abdomen:  .Liver: Cirrhotic liver morphology. Liver measures 17 cm. T2 hyperintense cyst or hamartoma right hepatic lobe. Areas of geographic steatosis. Periportal lymph  nodes in the drainage pathway of the liver. Tiny area of early arterial enhancement likely AP shunt image 57 series 13.  .Gallbladder: Cholecystectomy.   .Biliary: No obstruction.  Marland KitchenSpleen: Splenomegaly. This is likely related to portal hypertension.  .Pancreas: Normal  .Adrenals: Normal  .Stomach/bowel: Hiatus hernia.  Musculoskeletal:  Degenerative changes in the spine with posterior fixation hardware in the lumbar spine not optimally imaged by this modality.          Assessment / Plan:  1. Past stress induced CM - EF has recovered per last echo in May of 2015. No symptoms of heart failure. Seems to be holding her own from our standpoint. Her other medical issues are more pressing.   2. PAF - off of amiodarone - - Remains in sinus by exam today. Not a candidate for anticoagulation due to prior bleeding. I would assume she is no longer a candidate for amiodarone given her cirrhosis. Hopefully she will remain in NSR.  3. Cirrhosis - off of her statin and no longer on amiodarone.  4. Possible renal cell carcinoma. Family has opted for no further evaluation at this time. I worry that this is a renal cell carcinoma.   5. Prior bout of hyperkalemia - from aldactone - labs and plan of care per PCP  Current medicines are reviewed with the patient today.  The patient does not have concerns regarding medicines other than what has been noted above.  The following changes have been made:  See above.  Labs/ tests ordered today include:   No orders of the defined types were placed in this encounter.     Disposition:   FU with me in 6 months - will be available as needed - overall prognosis quite tenuous at best .   Patient is agreeable to this plan and will call if any problems develop in the interim.   Signed: Burtis Junes, RN, ANP-C 01/08/2015 1:47 PM  [facility] [address] [place], [place]  [address] Phone: [phone] Fax: [phone]

## 2015-01-08 NOTE — Patient Instructions (Signed)
We will be checking the following labs today - NONE   Medication Instructions:    Continue with your current medicines.     Testing/Procedures To Be Arranged:  N/A  Follow-Up:   I will see you back in 6 months    Other Special Instructions:   N/A  Call the [place] office at [phone] if you have any questions, problems or concerns.

## 2015-01-22 ENCOUNTER — Other Ambulatory Visit: Payer: Self-pay | Admitting: Cardiovascular Disease

## 2015-02-09 ENCOUNTER — Other Ambulatory Visit (HOSPITAL_COMMUNITY)
Admission: RE | Admit: 2015-02-09 | Discharge: 2015-02-09 | Disposition: A | Discharge status: Home / Self Care | Payer: Medicare Other | Source: Ambulatory Visit | Attending: Family Medicine | Admitting: Family Medicine

## 2015-02-09 DIAGNOSIS — N390 Urinary tract infection, site not specified: Secondary | ICD-10-CM | POA: Diagnosis present

## 2015-02-09 LAB — URINALYSIS, ROUTINE W REFLEX MICROSCOPIC
BILIRUBIN URINE: NEGATIVE
GLUCOSE, UA: NEGATIVE mg/dL
Hgb urine dipstick: NEGATIVE
Ketones, ur: NEGATIVE mg/dL
Leukocytes, UA: NEGATIVE
Nitrite: NEGATIVE
PH: 6.5 (ref 5.0–8.0)
PROTEIN: NEGATIVE mg/dL
SPECIFIC GRAVITY, URINE: 1.01 (ref 1.005–1.030)
Urobilinogen, UA: 0.2 mg/dL (ref 0.0–1.0)

## 2015-02-12 LAB — URINE CULTURE: Colony Count: 70000

## 2015-02-24 ENCOUNTER — Other Ambulatory Visit: Payer: Self-pay

## 2015-05-20 ENCOUNTER — Encounter: Payer: Self-pay | Admitting: *Deleted

## 2015-05-28 ENCOUNTER — Other Ambulatory Visit: Payer: Self-pay

## 2015-05-28 DIAGNOSIS — Z1231 Encounter for screening mammogram for malignant neoplasm of breast: Secondary | ICD-10-CM

## 2015-05-28 DIAGNOSIS — Z1239 Encounter for other screening for malignant neoplasm of breast: Secondary | ICD-10-CM

## 2015-07-11 ENCOUNTER — Ambulatory Visit: Payer: Medicare Other | Admitting: Nurse Practitioner

## 2015-07-22 ENCOUNTER — Other Ambulatory Visit: Payer: Self-pay | Admitting: Cardiovascular Disease

## 2015-07-25 ENCOUNTER — Ambulatory Visit: Payer: Medicare Other | Admitting: Nurse Practitioner

## 2015-07-30 ENCOUNTER — Ambulatory Visit (INDEPENDENT_AMBULATORY_CARE_PROVIDER_SITE_OTHER): Payer: Medicare Other | Admitting: Nurse Practitioner

## 2015-07-30 ENCOUNTER — Encounter: Payer: Self-pay | Admitting: Nurse Practitioner

## 2015-07-30 VITALS — BP 110/78 | HR 72 | Ht 62.0 in | Wt 113.0 lb

## 2015-07-30 DIAGNOSIS — I429 Cardiomyopathy, unspecified: Secondary | ICD-10-CM | POA: Diagnosis not present

## 2015-07-30 DIAGNOSIS — I5022 Chronic systolic (congestive) heart failure: Secondary | ICD-10-CM

## 2015-07-30 DIAGNOSIS — I480 Paroxysmal atrial fibrillation: Secondary | ICD-10-CM

## 2015-07-30 DIAGNOSIS — I428 Other cardiomyopathies: Secondary | ICD-10-CM

## 2015-07-30 NOTE — Patient Instructions (Addendum)
We will be checking the following labs today - NONE   Medication Instructions:    Continue with your current medicines.     Testing/Procedures To Be Arranged:  N/A  Follow-Up:   See me in a year    Other Special Instructions:   N/A    If you need a refill on your cardiac medications before your next appointment, please call your pharmacy.   Call the [place] office at [phone] if you have any questions, problems or concerns.

## 2015-07-30 NOTE — Progress Notes (Signed)
CARDIOLOGY OFFICE NOTE  Date:  07/30/2015    [name] Date of Birth: 1941-06-29 Medical Record [id number]  PCP:  Kern Alberta  Cardiologist:  Burt Knack    Chief Complaint  Patient presents with  . Congestive Heart Failure    6 month check - seen for Dr. Burt Knack  . Atrial Fibrillation    History of Present Illness: [name] is a 74 y.o. female who presents today for a 6 month check. Seen for Dr. Burt Knack. She has a very complex history. She had a prolonged hospitalization back in 2014 with respiratory failure from pneumonia and sepsis. This was associated with a NSTEMI and found to have severe LV dysfunction with EF of 20 to 25%. Cath showed NO obstructive CAD - felt to have a stress induced CM. Did have atrial fib and was put on amiodarone and Xarelto but had difficulty with anemia and guaiac positive stools. Her Xarelto was stopped and she was kept on aspirin. Her EF did recover and was 50 to 55% per echo in January of 2015 and 55% in May of 2015.   Her other issues include HLD, GERD, and HTN. She has had lots of GU/GI issues  - has had kidney stones - required a percutaneous nephrostomy tube - pyelonephritis - then elevated LFTs. Taken off of amiodarone. Off of her statin. Has had prior GI bleed and is no longer on any form of anticoagulation.   I last saw her last October in 2015 - she was not doing well. Seemed to be having more GI issues. Her cardiac status was felt to be stable. There was concern for a renal cell carcinoma.   Admitted back in April at [facility] - no records available - apparently her potassium was high - was 6.4. Had been on aldactone by the liver doctor. Still with a "spot on her kidney" but family has decided to "leave alone".   I last saw her in May - she seem to have stabilized for the most part.   Comes in today. Here with her daughter. Doing ok from cardiac standpoint. Rhythm is good. No chest pain. Not short of breath. Is eating too  much in the way of sugars. Not as active. Does not like going into this time of year - too dreary. GI doc from [facility] wanting he to cut down on her depression meds - she will discuss with PCP but admits that she does not do well in the winter time.   Past Medical History  Diagnosis Date  . GERD (gastroesophageal reflux disease)   . Hypercholesterolemia   . Diabetes mellitus ([place])   . Hypertension   . Bursitis   . FHx: migraine headaches   . Renal lithiasis   . 10 year risk of MI or stroke 7.5% or greater   . 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 Partial hysterectomy    . Hip surgery      right  . Cholecystectomy    . Esophagogastroduodenoscopy  06/12/2007    [id number] esophagus/patulous EG junction, moderate sized  hiatal hernia, otherwise normal stomach, D1, D2  . Colonoscopy  06/15/2005    RMR: Minimal internal hemorrhoids. Otherwise normal rectum/Normal colon  . Colonoscopy    12/14/2001    RMR: Internal hemorrhage, otherwise, normal rectum/ Polyps at 30 and 35 cm resected; the remainder of colonic mucosa appeared normal  . Colonoscopy  05/2009    RMR: minimal  hemorrhoids, sigmoid hyperplastic polyp  . Colonoscopy N/A 10/24/2013    [id number] colonic polyps removed as described above/Colonic diverticulosis  . Esophagogastroduodenoscopy N/A 10/24/2013    RMR: Small hiatal hernia; otherwise, normal EGD  . Left heart catheterization with coronary angiogram N/A 07/02/2013    Procedure: LEFT HEART CATHETERIZATION WITH CORONARY ANGIOGRAM;  Surgeon: Josue Hector, MD;  Location: [facility] CATH LAB;  Service: Cardiovascular;  Laterality: N/A;     Medications: Current Outpatient Prescriptions  Medication Sig Dispense Refill  . buPROPion (WELLBUTRIN SR) 150 MG 12 hr tablet Take 150 mg by mouth daily.    . carvedilol (COREG) 6.25 MG tablet TAKE ONE TABLET TWICE DAILY WITH A MEAL 60 tablet 0  . cholecalciferol (VITAMIN D) 1000 UNITS tablet  Take 2,000 Units by mouth daily.    Marland Kitchen FLUoxetine (PROZAC) 40 MG capsule Take 40 mg by mouth daily.    . furosemide (LASIX) 20 MG tablet TAKE ONE (1) TABLET EACH DAY 30 tablet 6  . glipiZIDE (GLUCOTROL) 5 MG tablet Take 5 mg by mouth.    . Multiple Vitamins-Minerals (PRESERVISION AREDS PO) Take by mouth.    Marland Kitchen omeprazole (PRILOSEC) 20 MG capsule Take 20 mg by mouth daily.     . ondansetron (ZOFRAN) 4 MG tablet Take 4 mg by mouth every 8 (eight) hours as needed. One tab q 8 h prn for nausea, vomiting    . Probiotic Product (PHILLIPS COLON HEALTH PO) Take 1 tablet by mouth daily.    Marland Kitchen sulfamethoxazole-trimethoprim (BACTRIM,SEPTRA) 400-80 MG per tablet Take 1 tablet by mouth daily.    . traMADol (ULTRAM) 50 MG tablet Take 50 mg by mouth every 6 (six) hours as needed.    . traZODone (DESYREL) 50 MG tablet Take 50 mg by mouth at bedtime.     . Zinc 50 MG TABS Take by mouth as directed.     No current facility-administered medications for this visit.    Allergies: Allergies  Allergen Reactions  . Codeine Other (See Comments)    Reaction unknown     Social History: The patient  reports that she quit smoking about 12 years ago. Her smoking use included Cigarettes. She started smoking about 55 years ago. She has a 43 pack-year smoking history. She has never used smokeless tobacco. She reports that she does not drink alcohol or use illicit drugs.   Family History: The patient's family history includes Colon cancer in her mother; Pancreatic cancer (age of onset: 51) in her father.   Review of Systems: Please see the history of present illness.   Otherwise, the review of systems is positive for none.   All other systems are reviewed and negative.   Physical Exam: VS:  BP 110/78 mmHg  Pulse 72  Ht 5\' 2"  (1.575 m)  Wt 113 lb (51.256 kg)  BMI 20.66 kg/m2 .  BMI Body mass index is 20.66 kg/(m^2).  Wt Readings from Last 3 Encounters:  07/30/15 113 lb (51.256 kg)  01/08/15 108 lb 12.8 oz (49.351  kg)  06/18/14 118 lb (53.524 kg)    General: Pleasant. Elderly female - looks a little stronger to me today - she is in no acute distress.  HEENT: Normal. Neck: Supple, no JVD, carotid bruits, or masses noted.  Cardiac: Regular rate and rhythm. No murmurs, rubs, or gallops. No edema.  Respiratory:  Lungs are clear to auscultation bilaterally with normal work of breathing.  GI: Soft and nontender.  MS: No deformity or atrophy. Gait and ROM  intact. Skin: Warm and dry. Color is normal.  Neuro:  Strength and sensation are intact and no gross focal deficits noted.  Psych: Alert, appropriate and with normal affect.   LABORATORY DATA:  EKG:  EKG is ordered today. This demonstrates NSR with nonspecific ST changes.  Lab Results  Component Value Date   WBC 10.2 06/18/2014   HGB 9.6* 06/18/2014   HCT 29.7* 06/18/2014   PLT 201.0 06/18/2014   GLUCOSE 94 06/18/2014   ALT 32 06/18/2014   AST 98* 06/18/2014   NA 140 06/18/2014   K 4.0 06/18/2014   CL 106 06/18/2014   CREATININE 1.4* 06/18/2014   BUN 18 06/18/2014   CO2 23 06/18/2014   TSH 1.31 03/13/2014   INR 1.07 07/02/2013    BNP (last 3 results) No results for input(s): BNP in the last 8760 hours.  ProBNP (last 3 results) No results for input(s): PROBNP in the last 8760 hours.   Other Studies Reviewed Today:  ECHO SUMMARY MAY 2015  The left ventricular size is normal.There is mild concentric left ventricular  hypertrophy.Left ventricular systolic function is low normal.LV ejection  fraction = 50-55%.No segmental wall motion abnormalities seen in the left  ventricle.  The right ventricle is normal in size and function.  The left atrium is mildly dilated.  There is mild mitral regurgitation.  Mild pulmonary hypertension.Estimated right ventricular systolic pressure is  39 mmHg.  Small pericardial effusion.  There is no comparison study available.  Reading Physician:  [place], [place], [id number] 01/11/2014  12:24 PM    MR KIDNEY [place] (11/15/2014 5:50 PM) MR KIDNEY [place] (11/15/2014 5:50 PM)  Impressions      Cystic enhancing lesion at the upper pole of right kidney remains concerning for cystic neoplasm. The size of the lesion is not significantly changed compared with study from 07/20/2014.     Minimally complex cyst interpolar left kidney. Attention on follow up.     Cirrhosis and portal hypertension.    MR KIDNEY WWO CONTRAST (11/15/2014 5:50 PM)  Narrative  MR ABDOMEN WITH AND WITHOUT CONTRAST, Nov 15, 2014 05:50:00 PM  .  INDICATION:ABDOMINAL PAIN NAUSEA, VOMITING, DIARRHEAQ61.00 Renal cyst R79.89 LFTs abnormal   COMPARISON: Renal ultrasound 08/09/2014. MRI 07/20/2014.  Marland Kitchen  TECHNIQUE: Multiplanar, multisequence MR images of the entire abdomen were obtained before and after intravenous administration of gadolinium-based contrast. 5 cc Multihance.     PROBLEM SPECIFIC FINDINGS:     Multiple simple and minimally complex T2 hyperintense cysts some with internal septations. Scarring left kidney. Hemorrhagic cyst in the upper pole left kidney.    T2 hyperintense cyst upper pole right kidney measuring 1.7 x 1.7 cm. Measuring at a similar level on prior this is essentially unchanged in size, from MRI dated 07/20/2014. This lesion contains areas of hemorrhage on the precontrast study. Lesion also demonstrates heterogeneous nodular enhancement on dynamic contrast enhanced sequences. The degree of nodular enhancement is slightly increased from prior. There is some restricted diffusion in the area of nodular enhancement.   Minimally complex cyst interpolar left kidney with a thin internal enhancing septation.         ADDITIONAL FINDINGS:  Lower Chest:  .Heart: Normal size. No pericardial effusion.  .Lungs/Pleura: No masses, consolidations, or effusions.   Vessels: Atherosclerosis of the abdominal aorta.   Abdomen:  .Liver: Cirrhotic liver morphology. Liver measures 17 cm. T2 hyperintense cyst or hamartoma right hepatic lobe. Areas of geographic steatosis. Periportal lymph nodes in the drainage pathway of  the liver. Tiny area of early arterial enhancement likely AP shunt image 57 series 13.  .Gallbladder: Cholecystectomy.   .Biliary: No obstruction.  Marland KitchenSpleen: Splenomegaly. This is likely related to portal hypertension.  .Pancreas: Normal  .Adrenals: Normal  .Stomach/bowel: Hiatus hernia.  Musculoskeletal: Degenerative changes in the spine with posterior fixation hardware in the lumbar spine not optimally imaged by this modality.          Assessment / Plan:  1. Past stress induced CM - EF recovered per last echo from May of 2015. No symptoms of heart failure. Seems to be holding her own from our standpoint. Her other medical issues remain more pressing.   2. PAF - off of amiodarone - - Remains in sinus by exam today. Not a candidate for anticoagulation due to prior bleeding. I would assume she is no longer a candidate for amiodarone given her cirrhosis. Hopefully she will remain in NSR.  3. Cirrhosis - off of her statin and no longer on amiodarone. Followed by [facility]  4. Possible renal cell carcinoma. Family has opted for no further evaluation at this time but will be seeing urology in the next month or so with what sounds like a scan. I worry that this is a renal cell carcinoma.          Current medicines are reviewed with the patient today.  The patient does not have concerns regarding medicines other than what has been noted above.  The following changes have been made:  See above.  Labs/ tests ordered today include:   No orders of the defined types were placed in this encounter.     Disposition:   FU with me in 12  months.   Patient is agreeable to this plan and will call if any problems develop in the interim.   Signed: Burtis Junes, RN, ANP-C 07/30/2015 3:00 PM  [facility] [address] [place], [place]  [address] Phone: [phone] Fax: [phone]

## 2015-08-04 ENCOUNTER — Ambulatory Visit
Admission: RE | Admit: 2015-08-04 | Discharge: 2015-08-04 | Disposition: A | Discharge status: Home / Self Care | Payer: Medicare Other | Source: Ambulatory Visit

## 2015-08-04 DIAGNOSIS — Z1231 Encounter for screening mammogram for malignant neoplasm of breast: Secondary | ICD-10-CM

## 2015-08-04 DIAGNOSIS — Z1239 Encounter for other screening for malignant neoplasm of breast: Secondary | ICD-10-CM

## 2015-08-27 ENCOUNTER — Other Ambulatory Visit: Payer: Self-pay | Admitting: Cardiovascular Disease

## 2016-06-30 ENCOUNTER — Encounter: Payer: Self-pay | Admitting: *Deleted

## 2016-08-03 ENCOUNTER — Ambulatory Visit: Payer: Medicare Other | Admitting: Nurse Practitioner

## 2016-08-05 ENCOUNTER — Encounter: Payer: Self-pay | Admitting: Nurse Practitioner

## 2016-08-10 ENCOUNTER — Encounter: Payer: Self-pay | Admitting: Nurse Practitioner

## 2016-08-10 ENCOUNTER — Ambulatory Visit (INDEPENDENT_AMBULATORY_CARE_PROVIDER_SITE_OTHER): Payer: Medicare Other | Admitting: Nurse Practitioner

## 2016-08-10 VITALS — BP 120/70 | HR 66 | Ht 62.0 in | Wt 116.0 lb

## 2016-08-10 DIAGNOSIS — I480 Paroxysmal atrial fibrillation: Secondary | ICD-10-CM | POA: Diagnosis not present

## 2016-08-10 DIAGNOSIS — L218 Other seborrheic dermatitis: Secondary | ICD-10-CM | POA: Diagnosis not present

## 2016-08-10 DIAGNOSIS — I428 Other cardiomyopathies: Secondary | ICD-10-CM

## 2016-08-10 DIAGNOSIS — L281 Prurigo nodularis: Secondary | ICD-10-CM | POA: Diagnosis not present

## 2016-08-10 DIAGNOSIS — I5022 Chronic systolic (congestive) heart failure: Secondary | ICD-10-CM | POA: Diagnosis not present

## 2016-08-10 DIAGNOSIS — L648 Other androgenic alopecia: Secondary | ICD-10-CM | POA: Diagnosis not present

## 2016-08-10 MED ORDER — CARVEDILOL 6.25 MG PO TABS: ORAL_TABLET | ORAL | 3 refills | Status: DC

## 2016-08-10 NOTE — Progress Notes (Signed)
CARDIOLOGY OFFICE NOTE  Date:  08/10/2016    [name] Date of Birth: 1940-12-02 Medical Record #[id number]  PCP:  Kern Alberta (Inactive)  Cardiologist:  Jerel Shepherd  Chief Complaint  Patient presents with  . Atrial Fibrillation    One year check - seen for Dr. Burt Knack    History of Present Illness: KEILEY LEVEY is a 75 y.o. female who presents today for a one year check.  Seen for Dr. Burt Knack.   She has a very complex history. She had a prolonged hospitalization back in 2014 with respiratory failure from pneumonia and sepsis. This was associated with a NSTEMI and found to have severe LV dysfunction with EF of 20 to 25%. Cath showed NO obstructive CAD - felt to have a stress induced CM. Did have atrial fib and was put on amiodarone and Xarelto but had difficulty with anemia and guaiac positive stools. Her Xarelto was stopped and she was kept on aspirin. Her EF did recover and was 50 to 55% per echo in January of 2015 and 55% in May of 2015.   Her other issues include HLD, GERD, and HTN. She has had lots of GU/GI issues  - has had kidney stones - required a percutaneous nephrostomy tube - pyelonephritis - then elevated LFTs. Taken off of amiodarone. Off of her statin. Has had prior GI bleed and is no longer on any form of anticoagulation.   I last saw her last October in 2015 - she was not doing well. Seemed to be having more GI issues. Her cardiac status was felt to be stable. There was concern for a renal cell carcinoma.   Admitted back in April at [facility] - no records available - apparently her potassium was high - was 6.4. Had been on aldactone by the liver doctor. Still with a "spot on her kidney" but family has decided to "leave alone".   I last saw her in November - overall seemed to be stable from a cardiac standpoint.   Comes in today. Here with her daughter. She has done pretty well. Did get her tumor out of her kidney - this was a renal  cell - no further treatment needed. No chest pain. More active. Has gained a little weight. Very little in the way of palpitations. Seeing PCP next week and having labs tomorrow. Needs Coreg refilled. Still followed by GI for her cirrhosis. Still sees GU as well.   Past Medical History:  Diagnosis Date  . 10 year risk of MI or stroke 7.5% or greater   . Bursitis   . Diabetes mellitus ([place])   . FHx: migraine headaches   . GERD (gastroesophageal reflux disease)   . Hypercholesterolemia   . Hypertension   . Renal lithiasis   . Takotsubo cardiomyopathy 06/2013    Past Surgical History:  Procedure Laterality Date  . CHOLECYSTECTOMY    . COLONOSCOPY  06/15/2005   RMR: Minimal internal hemorrhoids. Otherwise normal rectum/Normal colon  . COLONOSCOPY    12/14/2001   RMR: Internal hemorrhage, otherwise, normal rectum/ Polyps at 30 and 35 cm resected; the remainder of colonic mucosa appeared normal  . COLONOSCOPY  05/2009   RMR: minimal hemorrhoids, sigmoid hyperplastic polyp  . COLONOSCOPY N/A 10/24/2013   [id number] colonic polyps removed as described above/Colonic diverticulosis  . ESOPHAGOGASTRODUODENOSCOPY  06/12/2007   [id number]:[id number] esophagus/patulous EG junction, moderate sized  hiatal hernia, otherwise normal stomach, D1, D2  . ESOPHAGOGASTRODUODENOSCOPY N/A 10/24/2013   RMR:  Small hiatal hernia; otherwise, normal EGD  . HIP SURGERY     right  . LEFT HEART CATHETERIZATION WITH CORONARY ANGIOGRAM N/A 07/02/2013   Procedure: LEFT HEART CATHETERIZATION WITH CORONARY ANGIOGRAM;  Surgeon: Josue Hector, MD;  Location: [facility] CATH LAB;  Service: Cardiovascular;  Laterality: N/A;  . PARTIAL HYSTERECTOMY       Medications: Current Outpatient Prescriptions  Medication Sig Dispense Refill  . buPROPion (WELLBUTRIN SR) 150 MG 12 hr tablet Take 150 mg by mouth daily.    . carvedilol (COREG) 6.25 MG tablet TAKE ONE TABLET TWICE DAILY A MEAL 180 tablet 3  . cholecalciferol (VITAMIN D) 1000 UNITS  tablet Take 2,000 Units by mouth daily.    Marland Kitchen FLUoxetine (PROZAC) 40 MG capsule Take 40 mg by mouth daily.    . furosemide (LASIX) 20 MG tablet TAKE ONE (1) TABLET EACH DAY 30 tablet 6  . glipiZIDE (GLUCOTROL) 5 MG tablet Take 5 mg by mouth.    . Multiple Vitamins-Minerals (PRESERVISION AREDS PO) Take by mouth.    Marland Kitchen omeprazole (PRILOSEC) 20 MG capsule Take 20 mg by mouth daily.     . ondansetron (ZOFRAN) 4 MG tablet Take 4 mg by mouth every 8 (eight) hours as needed. One tab q 8 h prn for nausea, vomiting    . Probiotic Product (PHILLIPS COLON HEALTH PO) Take 1 tablet by mouth daily.    Marland Kitchen sulfamethoxazole-trimethoprim (BACTRIM,SEPTRA) 400-80 MG per tablet Take 1 tablet by mouth daily.    . traMADol (ULTRAM) 50 MG tablet Take 50 mg by mouth every 6 (six) hours as needed.    . traZODone (DESYREL) 50 MG tablet Take 50 mg by mouth at bedtime.     . Zinc 50 MG TABS Take by mouth as directed.     No current facility-administered medications for this visit.     Allergies: Allergies  Allergen Reactions  . Codeine Other (See Comments)    Reaction unknown     Social History: The patient  reports that she quit smoking about 13 years ago. Her smoking use included Cigarettes. She started smoking about 56 years ago. She has a 43.00 pack-year smoking history. She has never used smokeless tobacco. She reports that she does not drink alcohol or use drugs.   Family History: The patient's family history includes Colon cancer in her mother; Pancreatic cancer (age of onset: 10) in her father.   Review of Systems: Please see the history of present illness.   Otherwise, the review of systems is positive for none.   All other systems are reviewed and negative.   Physical Exam: VS:  BP 120/70   Pulse 66   Ht 5\' 2"  (1.575 m)   Wt 116 lb (52.6 kg)   BMI 21.22 kg/m  .  BMI Body mass index is 21.22 kg/m.  Wt Readings from Last 3 Encounters:  08/10/16 116 lb (52.6 kg)  07/30/15 113 lb (51.3 kg)    01/08/15 108 lb 12.8 oz (49.4 kg)    General: Pleasant. Well developed, well nourished and in no acute distress.   HEENT: Normal.  Neck: Supple, no JVD, carotid bruits, or masses noted.  Cardiac: Regular rate and rhythm. No murmurs, rubs, or gallops. No edema.  Respiratory:  Lungs are clear to auscultation bilaterally with normal work of breathing.  GI: Soft and nontender.  MS: No deformity or atrophy. Gait and ROM intact.  Skin: Warm and dry. Color is normal.  Neuro:  Strength and sensation are intact  and no gross focal deficits noted.  Psych: Alert, appropriate and with normal affect.   LABORATORY DATA:  EKG:  EKG is ordered today. This demonstrates NSR  Lab Results  Component Value Date   WBC 10.2 06/18/2014   HGB 9.6 (L) 06/18/2014   HCT 29.7 (L) 06/18/2014   PLT 201.0 06/18/2014   GLUCOSE 94 06/18/2014   ALT 32 06/18/2014   AST 98 (H) 06/18/2014   NA 140 06/18/2014   K 4.0 06/18/2014   CL 106 06/18/2014   CREATININE 1.4 (H) 06/18/2014   BUN 18 06/18/2014   CO2 23 06/18/2014   TSH 1.31 03/13/2014   INR 1.07 07/02/2013    BNP (last 3 results) No results for input(s): BNP in the last 8760 hours.  ProBNP (last 3 results) No results for input(s): PROBNP in the last 8760 hours.   Other Studies Reviewed Today:  ECHO SUMMARY MAY 2015  The left ventricular size is normal.There is mild concentric left ventricular  hypertrophy.Left ventricular systolic function is low normal.LV ejection  fraction = 50-55%.No segmental wall motion abnormalities seen in the left  ventricle.  The right ventricle is normal in size and function.  The left atrium is mildly dilated.  There is mild mitral regurgitation.  Mild pulmonary hypertension.Estimated right ventricular systolic pressure is  39 mmHg.  Small pericardial effusion.  There is no comparison study available.  Reading Physician:  Conni Slipper, [place], [id number] 01/11/2014 12:24 PM    MR KIDNEY [place]  (11/15/2014 5:50 PM)  Impressions      Cystic enhancing lesion at the upper pole of right kidney remains concerning for cystic neoplasm. The size of the lesion is not significantly changed compared with study from 07/20/2014.     Minimally complex cyst interpolar left kidney. Attention on follow up.     Cirrhosis and portal hypertension.    MR KIDNEY WWO CONTRAST (11/15/2014 5:50 PM)  Narrative  MR ABDOMEN WITH AND WITHOUT CONTRAST, Nov 15, 2014 05:50:00 PM  .  INDICATION:ABDOMINAL PAIN NAUSEA, VOMITING, DIARRHEAQ61.00 Renal cyst R79.89 LFTs abnormal   COMPARISON: Renal ultrasound 08/09/2014. MRI 07/20/2014.  Marland Kitchen  TECHNIQUE: Multiplanar, multisequence MR images of the entire abdomen were obtained before and after intravenous administration of gadolinium-based contrast. 5 cc Multihance.     PROBLEM SPECIFIC FINDINGS:     Multiple simple and minimally complex T2 hyperintense cysts some with internal septations. Scarring left kidney. Hemorrhagic cyst in the upper pole left kidney.    T2 hyperintense cyst upper pole right kidney measuring 1.7 x 1.7 cm. Measuring at a similar level on prior this is essentially unchanged in size, from MRI dated 07/20/2014. This lesion contains areas of hemorrhage on the precontrast study. Lesion also demonstrates heterogeneous nodular enhancement on dynamic contrast enhanced sequences. The degree of nodular enhancement is slightly increased from prior. There is some restricted diffusion in the area of nodular enhancement.   Minimally complex cyst interpolar left kidney with a thin internal enhancing septation.         ADDITIONAL FINDINGS:  Lower Chest:  .Heart: Normal size. No pericardial effusion.  .Lungs/Pleura: No masses, consolidations, or effusions.   Vessels: Atherosclerosis of the abdominal aorta.  Abdomen:  .Liver: Cirrhotic liver morphology. Liver measures 17 cm. T2  hyperintense cyst or hamartoma right hepatic lobe. Areas of geographic steatosis. Periportal lymph nodes in the drainage pathway of the liver. Tiny area of early arterial enhancement likely AP shunt image 57 series 13.  .Gallbladder: Cholecystectomy.   .Biliary: No obstruction.  Marland Kitchen  Spleen: Splenomegaly. This is likely related to portal hypertension.  .Pancreas: Normal  .Adrenals: Normal  .Stomach/bowel: Hiatus hernia.  Musculoskeletal: Degenerative changes in the spine with posterior fixation hardware in the lumbar spine not optimally imaged by this modality.          Assessment / Plan:  1. Past stress induced CM - EF recovered per last echo from May of 2015. No symptoms of heart failure. Seems to be holding her own from our standpoint. Her other medical issues remain more pressing but seem to have stabilized.   2. PAF - off of amiodarone - - Remains in sinus by exam today and by EKG. Not a candidate for anticoagulation due to prior bleeding. I would assume she is no longer a candidate for amiodarone given her cirrhosis. Hopefully she will remain in NSR.  3. Cirrhosis - off of her statin and no longer on amiodarone. Followed by [facility]  4. Renal cell carcinoma.  Has had tumor removed. Followed by GU at [facility].     Overall, she looks better to me today.   Current medicines are reviewed with the patient today.  The patient does not have concerns regarding medicines other than what has been noted above.  The following changes have been made:  See above.  Labs/ tests ordered today include:    Orders Placed This Encounter  Procedures  . EKG 12-Lead     Disposition:   FU with me in one year.   Patient is agreeable to this plan and will call if any problems develop in the interim.   Signed: Burtis Junes, RN, ANP-C 08/10/2016 4:00 PM  [facility] [address] [place], [place]  [address] Phone:  [phone] Fax: [phone]

## 2016-08-10 NOTE — Patient Instructions (Addendum)
We will be checking the following labs today - NONE   Medication Instructions:    Continue with your current medicines.   I have refilled your Coreg today.    Testing/Procedures To Be Arranged:  N/A  Follow-Up:   See me in one year.     Other Special Instructions:   You look great!!    If you need a refill on your cardiac medications before your next appointment, please call your pharmacy.   Call the [place] office at [phone] if you have any questions, problems or concerns.

## 2016-08-11 DIAGNOSIS — E782 Mixed hyperlipidemia: Secondary | ICD-10-CM | POA: Diagnosis not present

## 2016-08-11 DIAGNOSIS — D500 Iron deficiency anemia secondary to blood loss (chronic): Secondary | ICD-10-CM | POA: Diagnosis not present

## 2016-08-11 DIAGNOSIS — I10 Essential (primary) hypertension: Secondary | ICD-10-CM | POA: Diagnosis not present

## 2016-08-11 DIAGNOSIS — E875 Hyperkalemia: Secondary | ICD-10-CM | POA: Diagnosis not present

## 2016-08-11 DIAGNOSIS — N183 Chronic kidney disease, stage 3 (moderate): Secondary | ICD-10-CM | POA: Diagnosis not present

## 2016-08-11 DIAGNOSIS — E039 Hypothyroidism, unspecified: Secondary | ICD-10-CM | POA: Diagnosis not present

## 2016-08-11 DIAGNOSIS — K219 Gastro-esophageal reflux disease without esophagitis: Secondary | ICD-10-CM | POA: Diagnosis not present

## 2016-08-11 DIAGNOSIS — E1165 Type 2 diabetes mellitus with hyperglycemia: Secondary | ICD-10-CM | POA: Diagnosis not present

## 2016-08-11 DIAGNOSIS — D519 Vitamin B12 deficiency anemia, unspecified: Secondary | ICD-10-CM | POA: Diagnosis not present

## 2016-08-16 DIAGNOSIS — E782 Mixed hyperlipidemia: Secondary | ICD-10-CM | POA: Diagnosis not present

## 2016-08-16 DIAGNOSIS — I10 Essential (primary) hypertension: Secondary | ICD-10-CM | POA: Diagnosis not present

## 2016-08-16 DIAGNOSIS — N281 Cyst of kidney, acquired: Secondary | ICD-10-CM | POA: Diagnosis not present

## 2016-08-16 DIAGNOSIS — D649 Anemia, unspecified: Secondary | ICD-10-CM | POA: Diagnosis not present

## 2016-08-16 DIAGNOSIS — N184 Chronic kidney disease, stage 4 (severe): Secondary | ICD-10-CM | POA: Diagnosis not present

## 2016-08-16 DIAGNOSIS — F331 Major depressive disorder, recurrent, moderate: Secondary | ICD-10-CM | POA: Diagnosis not present

## 2016-08-16 DIAGNOSIS — Z1212 Encounter for screening for malignant neoplasm of rectum: Secondary | ICD-10-CM | POA: Diagnosis not present

## 2016-08-16 DIAGNOSIS — K219 Gastro-esophageal reflux disease without esophagitis: Secondary | ICD-10-CM | POA: Diagnosis not present

## 2016-08-16 DIAGNOSIS — G6289 Other specified polyneuropathies: Secondary | ICD-10-CM | POA: Diagnosis not present

## 2016-08-16 DIAGNOSIS — E1122 Type 2 diabetes mellitus with diabetic chronic kidney disease: Secondary | ICD-10-CM | POA: Diagnosis not present

## 2016-08-16 DIAGNOSIS — R300 Dysuria: Secondary | ICD-10-CM | POA: Diagnosis not present

## 2016-08-16 DIAGNOSIS — E1129 Type 2 diabetes mellitus with other diabetic kidney complication: Secondary | ICD-10-CM | POA: Diagnosis not present

## 2016-08-16 DIAGNOSIS — D500 Iron deficiency anemia secondary to blood loss (chronic): Secondary | ICD-10-CM | POA: Diagnosis not present

## 2016-08-16 DIAGNOSIS — J301 Allergic rhinitis due to pollen: Secondary | ICD-10-CM | POA: Diagnosis not present

## 2016-08-25 DIAGNOSIS — I252 Old myocardial infarction: Secondary | ICD-10-CM | POA: Diagnosis not present

## 2016-08-25 DIAGNOSIS — Z87891 Personal history of nicotine dependence: Secondary | ICD-10-CM | POA: Diagnosis not present

## 2016-08-25 DIAGNOSIS — I110 Hypertensive heart disease with heart failure: Secondary | ICD-10-CM | POA: Diagnosis not present

## 2016-08-25 DIAGNOSIS — Z87442 Personal history of urinary calculi: Secondary | ICD-10-CM | POA: Diagnosis not present

## 2016-08-25 DIAGNOSIS — G4730 Sleep apnea, unspecified: Secondary | ICD-10-CM | POA: Diagnosis not present

## 2016-08-25 DIAGNOSIS — G4700 Insomnia, unspecified: Secondary | ICD-10-CM | POA: Diagnosis not present

## 2016-08-25 DIAGNOSIS — N281 Cyst of kidney, acquired: Secondary | ICD-10-CM | POA: Diagnosis not present

## 2016-08-25 DIAGNOSIS — K219 Gastro-esophageal reflux disease without esophagitis: Secondary | ICD-10-CM | POA: Diagnosis not present

## 2016-08-25 DIAGNOSIS — F329 Major depressive disorder, single episode, unspecified: Secondary | ICD-10-CM | POA: Diagnosis not present

## 2016-08-25 DIAGNOSIS — I509 Heart failure, unspecified: Secondary | ICD-10-CM | POA: Diagnosis not present

## 2016-08-25 DIAGNOSIS — Z85528 Personal history of other malignant neoplasm of kidney: Secondary | ICD-10-CM | POA: Diagnosis not present

## 2016-08-25 DIAGNOSIS — E119 Type 2 diabetes mellitus without complications: Secondary | ICD-10-CM | POA: Diagnosis not present

## 2016-08-25 DIAGNOSIS — N390 Urinary tract infection, site not specified: Secondary | ICD-10-CM | POA: Diagnosis not present

## 2016-08-25 DIAGNOSIS — N201 Calculus of ureter: Secondary | ICD-10-CM | POA: Diagnosis not present

## 2016-09-01 ENCOUNTER — Encounter: Payer: Self-pay | Admitting: Internal Medicine

## 2016-11-01 DIAGNOSIS — R300 Dysuria: Secondary | ICD-10-CM | POA: Diagnosis not present

## 2016-11-01 DIAGNOSIS — I480 Paroxysmal atrial fibrillation: Secondary | ICD-10-CM | POA: Diagnosis not present

## 2016-11-01 DIAGNOSIS — N281 Cyst of kidney, acquired: Secondary | ICD-10-CM | POA: Diagnosis not present

## 2016-11-01 DIAGNOSIS — N184 Chronic kidney disease, stage 4 (severe): Secondary | ICD-10-CM | POA: Diagnosis not present

## 2016-11-01 DIAGNOSIS — K7460 Unspecified cirrhosis of liver: Secondary | ICD-10-CM | POA: Diagnosis not present

## 2016-11-01 DIAGNOSIS — E1122 Type 2 diabetes mellitus with diabetic chronic kidney disease: Secondary | ICD-10-CM | POA: Diagnosis not present

## 2016-11-01 DIAGNOSIS — I10 Essential (primary) hypertension: Secondary | ICD-10-CM | POA: Diagnosis not present

## 2016-11-10 DIAGNOSIS — Z23 Encounter for immunization: Secondary | ICD-10-CM | POA: Diagnosis not present

## 2016-11-10 DIAGNOSIS — I10 Essential (primary) hypertension: Secondary | ICD-10-CM | POA: Diagnosis not present

## 2016-11-10 DIAGNOSIS — D500 Iron deficiency anemia secondary to blood loss (chronic): Secondary | ICD-10-CM | POA: Diagnosis not present

## 2016-11-10 DIAGNOSIS — E782 Mixed hyperlipidemia: Secondary | ICD-10-CM | POA: Diagnosis not present

## 2016-11-10 DIAGNOSIS — N184 Chronic kidney disease, stage 4 (severe): Secondary | ICD-10-CM | POA: Diagnosis not present

## 2016-11-10 DIAGNOSIS — G6289 Other specified polyneuropathies: Secondary | ICD-10-CM | POA: Diagnosis not present

## 2016-11-10 DIAGNOSIS — D649 Anemia, unspecified: Secondary | ICD-10-CM | POA: Diagnosis not present

## 2016-11-10 DIAGNOSIS — E1122 Type 2 diabetes mellitus with diabetic chronic kidney disease: Secondary | ICD-10-CM | POA: Diagnosis not present

## 2016-11-10 DIAGNOSIS — Z1212 Encounter for screening for malignant neoplasm of rectum: Secondary | ICD-10-CM | POA: Diagnosis not present

## 2016-11-10 DIAGNOSIS — F331 Major depressive disorder, recurrent, moderate: Secondary | ICD-10-CM | POA: Diagnosis not present

## 2016-11-10 DIAGNOSIS — J301 Allergic rhinitis due to pollen: Secondary | ICD-10-CM | POA: Diagnosis not present

## 2016-11-10 DIAGNOSIS — N281 Cyst of kidney, acquired: Secondary | ICD-10-CM | POA: Diagnosis not present

## 2016-11-10 DIAGNOSIS — K219 Gastro-esophageal reflux disease without esophagitis: Secondary | ICD-10-CM | POA: Diagnosis not present

## 2016-11-18 DIAGNOSIS — R5382 Chronic fatigue, unspecified: Secondary | ICD-10-CM | POA: Diagnosis not present

## 2016-12-13 DIAGNOSIS — R1012 Left upper quadrant pain: Secondary | ICD-10-CM | POA: Diagnosis not present

## 2016-12-13 DIAGNOSIS — R300 Dysuria: Secondary | ICD-10-CM | POA: Diagnosis not present

## 2016-12-13 DIAGNOSIS — Z6820 Body mass index (BMI) 20.0-20.9, adult: Secondary | ICD-10-CM | POA: Diagnosis not present

## 2016-12-13 DIAGNOSIS — N10 Acute tubulo-interstitial nephritis: Secondary | ICD-10-CM | POA: Diagnosis not present

## 2016-12-13 DIAGNOSIS — M545 Low back pain: Secondary | ICD-10-CM | POA: Diagnosis not present

## 2016-12-22 DIAGNOSIS — Z79891 Long term (current) use of opiate analgesic: Secondary | ICD-10-CM | POA: Diagnosis not present

## 2016-12-22 DIAGNOSIS — M6281 Muscle weakness (generalized): Secondary | ICD-10-CM | POA: Diagnosis not present

## 2016-12-22 DIAGNOSIS — Z87891 Personal history of nicotine dependence: Secondary | ICD-10-CM | POA: Diagnosis not present

## 2016-12-22 DIAGNOSIS — E119 Type 2 diabetes mellitus without complications: Secondary | ICD-10-CM | POA: Diagnosis not present

## 2016-12-22 DIAGNOSIS — Z7984 Long term (current) use of oral hypoglycemic drugs: Secondary | ICD-10-CM | POA: Diagnosis not present

## 2016-12-22 DIAGNOSIS — I10 Essential (primary) hypertension: Secondary | ICD-10-CM | POA: Diagnosis not present

## 2016-12-22 DIAGNOSIS — N10 Acute tubulo-interstitial nephritis: Secondary | ICD-10-CM | POA: Diagnosis not present

## 2016-12-22 DIAGNOSIS — M549 Dorsalgia, unspecified: Secondary | ICD-10-CM | POA: Diagnosis not present

## 2016-12-22 DIAGNOSIS — G8220 Paraplegia, unspecified: Secondary | ICD-10-CM | POA: Diagnosis not present

## 2016-12-23 DIAGNOSIS — N10 Acute tubulo-interstitial nephritis: Secondary | ICD-10-CM | POA: Diagnosis not present

## 2016-12-23 DIAGNOSIS — Z79891 Long term (current) use of opiate analgesic: Secondary | ICD-10-CM | POA: Diagnosis not present

## 2016-12-23 DIAGNOSIS — E119 Type 2 diabetes mellitus without complications: Secondary | ICD-10-CM | POA: Diagnosis not present

## 2016-12-23 DIAGNOSIS — I10 Essential (primary) hypertension: Secondary | ICD-10-CM | POA: Diagnosis not present

## 2016-12-23 DIAGNOSIS — M549 Dorsalgia, unspecified: Secondary | ICD-10-CM | POA: Diagnosis not present

## 2016-12-23 DIAGNOSIS — Z87891 Personal history of nicotine dependence: Secondary | ICD-10-CM | POA: Diagnosis not present

## 2016-12-23 DIAGNOSIS — G8220 Paraplegia, unspecified: Secondary | ICD-10-CM | POA: Diagnosis not present

## 2016-12-23 DIAGNOSIS — Z7984 Long term (current) use of oral hypoglycemic drugs: Secondary | ICD-10-CM | POA: Diagnosis not present

## 2016-12-23 DIAGNOSIS — M6281 Muscle weakness (generalized): Secondary | ICD-10-CM | POA: Diagnosis not present

## 2016-12-25 DIAGNOSIS — M6281 Muscle weakness (generalized): Secondary | ICD-10-CM | POA: Diagnosis not present

## 2016-12-25 DIAGNOSIS — M549 Dorsalgia, unspecified: Secondary | ICD-10-CM | POA: Diagnosis not present

## 2016-12-25 DIAGNOSIS — Z7984 Long term (current) use of oral hypoglycemic drugs: Secondary | ICD-10-CM | POA: Diagnosis not present

## 2016-12-25 DIAGNOSIS — I10 Essential (primary) hypertension: Secondary | ICD-10-CM | POA: Diagnosis not present

## 2016-12-25 DIAGNOSIS — E119 Type 2 diabetes mellitus without complications: Secondary | ICD-10-CM | POA: Diagnosis not present

## 2016-12-25 DIAGNOSIS — N10 Acute tubulo-interstitial nephritis: Secondary | ICD-10-CM | POA: Diagnosis not present

## 2016-12-25 DIAGNOSIS — Z79891 Long term (current) use of opiate analgesic: Secondary | ICD-10-CM | POA: Diagnosis not present

## 2016-12-25 DIAGNOSIS — Z87891 Personal history of nicotine dependence: Secondary | ICD-10-CM | POA: Diagnosis not present

## 2016-12-25 DIAGNOSIS — G8220 Paraplegia, unspecified: Secondary | ICD-10-CM | POA: Diagnosis not present

## 2016-12-29 DIAGNOSIS — G8220 Paraplegia, unspecified: Secondary | ICD-10-CM | POA: Diagnosis not present

## 2016-12-29 DIAGNOSIS — Z7984 Long term (current) use of oral hypoglycemic drugs: Secondary | ICD-10-CM | POA: Diagnosis not present

## 2016-12-29 DIAGNOSIS — M549 Dorsalgia, unspecified: Secondary | ICD-10-CM | POA: Diagnosis not present

## 2016-12-29 DIAGNOSIS — E119 Type 2 diabetes mellitus without complications: Secondary | ICD-10-CM | POA: Diagnosis not present

## 2016-12-29 DIAGNOSIS — M6281 Muscle weakness (generalized): Secondary | ICD-10-CM | POA: Diagnosis not present

## 2016-12-29 DIAGNOSIS — Z87891 Personal history of nicotine dependence: Secondary | ICD-10-CM | POA: Diagnosis not present

## 2016-12-29 DIAGNOSIS — I10 Essential (primary) hypertension: Secondary | ICD-10-CM | POA: Diagnosis not present

## 2016-12-29 DIAGNOSIS — Z79891 Long term (current) use of opiate analgesic: Secondary | ICD-10-CM | POA: Diagnosis not present

## 2016-12-29 DIAGNOSIS — N10 Acute tubulo-interstitial nephritis: Secondary | ICD-10-CM | POA: Diagnosis not present

## 2016-12-31 DIAGNOSIS — Z79891 Long term (current) use of opiate analgesic: Secondary | ICD-10-CM | POA: Diagnosis not present

## 2016-12-31 DIAGNOSIS — I10 Essential (primary) hypertension: Secondary | ICD-10-CM | POA: Diagnosis not present

## 2016-12-31 DIAGNOSIS — N10 Acute tubulo-interstitial nephritis: Secondary | ICD-10-CM | POA: Diagnosis not present

## 2016-12-31 DIAGNOSIS — M549 Dorsalgia, unspecified: Secondary | ICD-10-CM | POA: Diagnosis not present

## 2016-12-31 DIAGNOSIS — M6281 Muscle weakness (generalized): Secondary | ICD-10-CM | POA: Diagnosis not present

## 2016-12-31 DIAGNOSIS — E119 Type 2 diabetes mellitus without complications: Secondary | ICD-10-CM | POA: Diagnosis not present

## 2016-12-31 DIAGNOSIS — G8220 Paraplegia, unspecified: Secondary | ICD-10-CM | POA: Diagnosis not present

## 2016-12-31 DIAGNOSIS — Z87891 Personal history of nicotine dependence: Secondary | ICD-10-CM | POA: Diagnosis not present

## 2016-12-31 DIAGNOSIS — Z7984 Long term (current) use of oral hypoglycemic drugs: Secondary | ICD-10-CM | POA: Diagnosis not present

## 2017-01-06 DIAGNOSIS — Z87891 Personal history of nicotine dependence: Secondary | ICD-10-CM | POA: Diagnosis not present

## 2017-01-06 DIAGNOSIS — M6281 Muscle weakness (generalized): Secondary | ICD-10-CM | POA: Diagnosis not present

## 2017-01-06 DIAGNOSIS — G8220 Paraplegia, unspecified: Secondary | ICD-10-CM | POA: Diagnosis not present

## 2017-01-06 DIAGNOSIS — M549 Dorsalgia, unspecified: Secondary | ICD-10-CM | POA: Diagnosis not present

## 2017-01-06 DIAGNOSIS — Z79891 Long term (current) use of opiate analgesic: Secondary | ICD-10-CM | POA: Diagnosis not present

## 2017-01-06 DIAGNOSIS — N10 Acute tubulo-interstitial nephritis: Secondary | ICD-10-CM | POA: Diagnosis not present

## 2017-01-06 DIAGNOSIS — E119 Type 2 diabetes mellitus without complications: Secondary | ICD-10-CM | POA: Diagnosis not present

## 2017-01-06 DIAGNOSIS — I10 Essential (primary) hypertension: Secondary | ICD-10-CM | POA: Diagnosis not present

## 2017-01-06 DIAGNOSIS — Z7984 Long term (current) use of oral hypoglycemic drugs: Secondary | ICD-10-CM | POA: Diagnosis not present

## 2017-01-08 DIAGNOSIS — R69 Illness, unspecified: Secondary | ICD-10-CM | POA: Diagnosis not present

## 2017-01-10 DIAGNOSIS — E119 Type 2 diabetes mellitus without complications: Secondary | ICD-10-CM | POA: Diagnosis not present

## 2017-01-10 DIAGNOSIS — M549 Dorsalgia, unspecified: Secondary | ICD-10-CM | POA: Diagnosis not present

## 2017-01-10 DIAGNOSIS — Z79891 Long term (current) use of opiate analgesic: Secondary | ICD-10-CM | POA: Diagnosis not present

## 2017-01-10 DIAGNOSIS — G8220 Paraplegia, unspecified: Secondary | ICD-10-CM | POA: Diagnosis not present

## 2017-01-10 DIAGNOSIS — N10 Acute tubulo-interstitial nephritis: Secondary | ICD-10-CM | POA: Diagnosis not present

## 2017-01-10 DIAGNOSIS — M6281 Muscle weakness (generalized): Secondary | ICD-10-CM | POA: Diagnosis not present

## 2017-01-10 DIAGNOSIS — Z87891 Personal history of nicotine dependence: Secondary | ICD-10-CM | POA: Diagnosis not present

## 2017-01-10 DIAGNOSIS — Z7984 Long term (current) use of oral hypoglycemic drugs: Secondary | ICD-10-CM | POA: Diagnosis not present

## 2017-01-10 DIAGNOSIS — I10 Essential (primary) hypertension: Secondary | ICD-10-CM | POA: Diagnosis not present

## 2017-01-11 DIAGNOSIS — N10 Acute tubulo-interstitial nephritis: Secondary | ICD-10-CM | POA: Diagnosis not present

## 2017-01-11 DIAGNOSIS — G8220 Paraplegia, unspecified: Secondary | ICD-10-CM | POA: Diagnosis not present

## 2017-01-11 DIAGNOSIS — Z87891 Personal history of nicotine dependence: Secondary | ICD-10-CM | POA: Diagnosis not present

## 2017-01-11 DIAGNOSIS — I10 Essential (primary) hypertension: Secondary | ICD-10-CM | POA: Diagnosis not present

## 2017-01-11 DIAGNOSIS — M549 Dorsalgia, unspecified: Secondary | ICD-10-CM | POA: Diagnosis not present

## 2017-01-11 DIAGNOSIS — Z7984 Long term (current) use of oral hypoglycemic drugs: Secondary | ICD-10-CM | POA: Diagnosis not present

## 2017-01-11 DIAGNOSIS — M6281 Muscle weakness (generalized): Secondary | ICD-10-CM | POA: Diagnosis not present

## 2017-01-11 DIAGNOSIS — E119 Type 2 diabetes mellitus without complications: Secondary | ICD-10-CM | POA: Diagnosis not present

## 2017-01-11 DIAGNOSIS — Z79891 Long term (current) use of opiate analgesic: Secondary | ICD-10-CM | POA: Diagnosis not present

## 2017-01-12 DIAGNOSIS — K7460 Unspecified cirrhosis of liver: Secondary | ICD-10-CM | POA: Diagnosis not present

## 2017-01-12 DIAGNOSIS — K760 Fatty (change of) liver, not elsewhere classified: Secondary | ICD-10-CM | POA: Diagnosis not present

## 2017-01-12 DIAGNOSIS — N281 Cyst of kidney, acquired: Secondary | ICD-10-CM | POA: Diagnosis not present

## 2017-01-12 DIAGNOSIS — R945 Abnormal results of liver function studies: Secondary | ICD-10-CM | POA: Diagnosis not present

## 2017-01-12 DIAGNOSIS — D509 Iron deficiency anemia, unspecified: Secondary | ICD-10-CM | POA: Diagnosis not present

## 2017-01-13 DIAGNOSIS — Z7984 Long term (current) use of oral hypoglycemic drugs: Secondary | ICD-10-CM | POA: Diagnosis not present

## 2017-01-13 DIAGNOSIS — Z79891 Long term (current) use of opiate analgesic: Secondary | ICD-10-CM | POA: Diagnosis not present

## 2017-01-13 DIAGNOSIS — Z87891 Personal history of nicotine dependence: Secondary | ICD-10-CM | POA: Diagnosis not present

## 2017-01-13 DIAGNOSIS — M6281 Muscle weakness (generalized): Secondary | ICD-10-CM | POA: Diagnosis not present

## 2017-01-13 DIAGNOSIS — N10 Acute tubulo-interstitial nephritis: Secondary | ICD-10-CM | POA: Diagnosis not present

## 2017-01-13 DIAGNOSIS — E119 Type 2 diabetes mellitus without complications: Secondary | ICD-10-CM | POA: Diagnosis not present

## 2017-01-13 DIAGNOSIS — G8220 Paraplegia, unspecified: Secondary | ICD-10-CM | POA: Diagnosis not present

## 2017-01-13 DIAGNOSIS — M549 Dorsalgia, unspecified: Secondary | ICD-10-CM | POA: Diagnosis not present

## 2017-01-13 DIAGNOSIS — I10 Essential (primary) hypertension: Secondary | ICD-10-CM | POA: Diagnosis not present

## 2017-01-17 DIAGNOSIS — Z23 Encounter for immunization: Secondary | ICD-10-CM | POA: Diagnosis not present

## 2017-01-20 DIAGNOSIS — E119 Type 2 diabetes mellitus without complications: Secondary | ICD-10-CM | POA: Diagnosis not present

## 2017-01-20 DIAGNOSIS — K7469 Other cirrhosis of liver: Secondary | ICD-10-CM | POA: Diagnosis not present

## 2017-01-20 DIAGNOSIS — C641 Malignant neoplasm of right kidney, except renal pelvis: Secondary | ICD-10-CM | POA: Diagnosis not present

## 2017-01-20 DIAGNOSIS — D500 Iron deficiency anemia secondary to blood loss (chronic): Secondary | ICD-10-CM | POA: Diagnosis not present

## 2017-01-20 DIAGNOSIS — D538 Other specified nutritional anemias: Secondary | ICD-10-CM | POA: Diagnosis not present

## 2017-01-21 DIAGNOSIS — R0602 Shortness of breath: Secondary | ICD-10-CM | POA: Diagnosis not present

## 2017-01-21 DIAGNOSIS — R531 Weakness: Secondary | ICD-10-CM | POA: Diagnosis not present

## 2017-01-21 DIAGNOSIS — D649 Anemia, unspecified: Secondary | ICD-10-CM | POA: Diagnosis not present

## 2017-01-21 DIAGNOSIS — I959 Hypotension, unspecified: Secondary | ICD-10-CM | POA: Diagnosis not present

## 2017-01-27 ENCOUNTER — Telehealth: Payer: Self-pay | Admitting: Nurse Practitioner

## 2017-01-27 NOTE — Telephone Encounter (Signed)
Ready previous telephone note. S/w Daughter per (DPR)  Stated this medication is being shipped to [name]'s house and [name] lives by herself. Stated daughter was worried about [name]'s heart and CKD. [name]'s daughter was also concerned that does someone need to observe [name] after shots are given??  Stated did whoever gave [name] this medication go over instructions??   Stated not yet.   Also mentioned the [name] went for blood work on May 9 received a copy of labs through computer.  HGB 6.6. Went to Dr. Arcola Jansky on May 14 Hgb was still not addressed.  Friday, May 25 [name]'s hgb was finally addressed and stated doctor's were scrambling, received two pints of blood that day. Will send to Cecille Rubin to advise.

## 2017-01-27 NOTE — Telephone Encounter (Signed)
New message   Pt c/o medication issue:  1. Name of Medication: erythrotoietin  2. How are you currently taking this medication (dosage and times per day)? 4000 units 3x day for 30 days  3. Are you having a reaction (difficulty breathing--STAT)? no  4. What is your medication issue? Dr at [place] wants to give pt this injection and pt daughter states that after reading up on this medication it is not good for patients with heart problems. She needs advice on whether or not her mother can take these injections.

## 2017-01-27 NOTE — Telephone Encounter (Signed)
I am not familiar with the administration of this drug. Typically given either in the office setting (hematology/nephrology) or thru outpatient centers. It is to help her hemoglobin come up. Ok to use. Would hope that source of low hemoglobin is being worked up.

## 2017-01-28 NOTE — Telephone Encounter (Signed)
S/w pt's daughter per [facility]) is aware of Lori's recommendations.

## 2017-02-01 DIAGNOSIS — D500 Iron deficiency anemia secondary to blood loss (chronic): Secondary | ICD-10-CM | POA: Diagnosis not present

## 2017-02-04 DIAGNOSIS — D500 Iron deficiency anemia secondary to blood loss (chronic): Secondary | ICD-10-CM | POA: Diagnosis not present

## 2017-02-23 DIAGNOSIS — N2889 Other specified disorders of kidney and ureter: Secondary | ICD-10-CM | POA: Diagnosis not present

## 2017-02-23 DIAGNOSIS — N281 Cyst of kidney, acquired: Secondary | ICD-10-CM | POA: Diagnosis not present

## 2017-03-08 DIAGNOSIS — R69 Illness, unspecified: Secondary | ICD-10-CM | POA: Diagnosis not present

## 2017-03-25 DIAGNOSIS — D649 Anemia, unspecified: Secondary | ICD-10-CM | POA: Diagnosis not present

## 2017-03-25 DIAGNOSIS — D500 Iron deficiency anemia secondary to blood loss (chronic): Secondary | ICD-10-CM | POA: Diagnosis not present

## 2017-03-25 DIAGNOSIS — E782 Mixed hyperlipidemia: Secondary | ICD-10-CM | POA: Diagnosis not present

## 2017-03-25 DIAGNOSIS — E1122 Type 2 diabetes mellitus with diabetic chronic kidney disease: Secondary | ICD-10-CM | POA: Diagnosis not present

## 2017-03-25 DIAGNOSIS — Z9189 Other specified personal risk factors, not elsewhere classified: Secondary | ICD-10-CM | POA: Diagnosis not present

## 2017-03-25 DIAGNOSIS — E875 Hyperkalemia: Secondary | ICD-10-CM | POA: Diagnosis not present

## 2017-03-25 DIAGNOSIS — N184 Chronic kidney disease, stage 4 (severe): Secondary | ICD-10-CM | POA: Diagnosis not present

## 2017-03-25 DIAGNOSIS — E1165 Type 2 diabetes mellitus with hyperglycemia: Secondary | ICD-10-CM | POA: Diagnosis not present

## 2017-03-28 DIAGNOSIS — E1122 Type 2 diabetes mellitus with diabetic chronic kidney disease: Secondary | ICD-10-CM | POA: Diagnosis not present

## 2017-03-28 DIAGNOSIS — N184 Chronic kidney disease, stage 4 (severe): Secondary | ICD-10-CM | POA: Diagnosis not present

## 2017-03-28 DIAGNOSIS — D500 Iron deficiency anemia secondary to blood loss (chronic): Secondary | ICD-10-CM | POA: Diagnosis not present

## 2017-03-28 DIAGNOSIS — K219 Gastro-esophageal reflux disease without esophagitis: Secondary | ICD-10-CM | POA: Diagnosis not present

## 2017-03-28 DIAGNOSIS — Z6820 Body mass index (BMI) 20.0-20.9, adult: Secondary | ICD-10-CM | POA: Diagnosis not present

## 2017-03-28 DIAGNOSIS — D649 Anemia, unspecified: Secondary | ICD-10-CM | POA: Diagnosis not present

## 2017-03-28 DIAGNOSIS — R69 Illness, unspecified: Secondary | ICD-10-CM | POA: Diagnosis not present

## 2017-03-28 DIAGNOSIS — Z0001 Encounter for general adult medical examination with abnormal findings: Secondary | ICD-10-CM | POA: Diagnosis not present

## 2017-03-28 DIAGNOSIS — J301 Allergic rhinitis due to pollen: Secondary | ICD-10-CM | POA: Diagnosis not present

## 2017-03-28 DIAGNOSIS — I10 Essential (primary) hypertension: Secondary | ICD-10-CM | POA: Diagnosis not present

## 2017-03-28 DIAGNOSIS — E782 Mixed hyperlipidemia: Secondary | ICD-10-CM | POA: Diagnosis not present

## 2017-03-28 DIAGNOSIS — Z1212 Encounter for screening for malignant neoplasm of rectum: Secondary | ICD-10-CM | POA: Diagnosis not present

## 2017-03-28 DIAGNOSIS — G6289 Other specified polyneuropathies: Secondary | ICD-10-CM | POA: Diagnosis not present

## 2017-04-18 DIAGNOSIS — D500 Iron deficiency anemia secondary to blood loss (chronic): Secondary | ICD-10-CM | POA: Diagnosis not present

## 2017-04-25 DIAGNOSIS — R300 Dysuria: Secondary | ICD-10-CM | POA: Diagnosis not present

## 2017-04-25 DIAGNOSIS — Z6820 Body mass index (BMI) 20.0-20.9, adult: Secondary | ICD-10-CM | POA: Diagnosis not present

## 2017-04-25 DIAGNOSIS — N200 Calculus of kidney: Secondary | ICD-10-CM | POA: Diagnosis not present

## 2017-04-25 DIAGNOSIS — N3001 Acute cystitis with hematuria: Secondary | ICD-10-CM | POA: Diagnosis not present

## 2017-05-04 DIAGNOSIS — R69 Illness, unspecified: Secondary | ICD-10-CM | POA: Diagnosis not present

## 2017-05-07 DIAGNOSIS — R300 Dysuria: Secondary | ICD-10-CM | POA: Diagnosis not present

## 2017-05-07 DIAGNOSIS — N3000 Acute cystitis without hematuria: Secondary | ICD-10-CM | POA: Diagnosis not present

## 2017-05-07 DIAGNOSIS — Z6820 Body mass index (BMI) 20.0-20.9, adult: Secondary | ICD-10-CM | POA: Diagnosis not present

## 2017-07-02 DIAGNOSIS — R69 Illness, unspecified: Secondary | ICD-10-CM | POA: Diagnosis not present

## 2017-07-20 DIAGNOSIS — C641 Malignant neoplasm of right kidney, except renal pelvis: Secondary | ICD-10-CM | POA: Diagnosis not present

## 2017-07-20 DIAGNOSIS — D538 Other specified nutritional anemias: Secondary | ICD-10-CM | POA: Diagnosis not present

## 2017-07-20 DIAGNOSIS — K7469 Other cirrhosis of liver: Secondary | ICD-10-CM | POA: Diagnosis not present

## 2017-07-20 DIAGNOSIS — E1122 Type 2 diabetes mellitus with diabetic chronic kidney disease: Secondary | ICD-10-CM | POA: Diagnosis not present

## 2017-07-20 DIAGNOSIS — E119 Type 2 diabetes mellitus without complications: Secondary | ICD-10-CM | POA: Diagnosis not present

## 2017-07-20 DIAGNOSIS — N189 Chronic kidney disease, unspecified: Secondary | ICD-10-CM | POA: Diagnosis not present

## 2017-07-20 DIAGNOSIS — D509 Iron deficiency anemia, unspecified: Secondary | ICD-10-CM | POA: Diagnosis not present

## 2017-07-26 DIAGNOSIS — N184 Chronic kidney disease, stage 4 (severe): Secondary | ICD-10-CM | POA: Diagnosis not present

## 2017-07-29 DIAGNOSIS — C641 Malignant neoplasm of right kidney, except renal pelvis: Secondary | ICD-10-CM | POA: Diagnosis not present

## 2017-07-29 DIAGNOSIS — D509 Iron deficiency anemia, unspecified: Secondary | ICD-10-CM | POA: Diagnosis not present

## 2017-07-29 DIAGNOSIS — D538 Other specified nutritional anemias: Secondary | ICD-10-CM | POA: Diagnosis not present

## 2017-07-29 DIAGNOSIS — E119 Type 2 diabetes mellitus without complications: Secondary | ICD-10-CM | POA: Diagnosis not present

## 2017-07-29 DIAGNOSIS — K7469 Other cirrhosis of liver: Secondary | ICD-10-CM | POA: Diagnosis not present

## 2017-08-03 DIAGNOSIS — N3000 Acute cystitis without hematuria: Secondary | ICD-10-CM | POA: Diagnosis not present

## 2017-08-03 DIAGNOSIS — N184 Chronic kidney disease, stage 4 (severe): Secondary | ICD-10-CM | POA: Diagnosis not present

## 2017-08-03 DIAGNOSIS — C641 Malignant neoplasm of right kidney, except renal pelvis: Secondary | ICD-10-CM | POA: Diagnosis not present

## 2017-08-03 DIAGNOSIS — I10 Essential (primary) hypertension: Secondary | ICD-10-CM | POA: Diagnosis not present

## 2017-08-03 DIAGNOSIS — E1122 Type 2 diabetes mellitus with diabetic chronic kidney disease: Secondary | ICD-10-CM | POA: Diagnosis not present

## 2017-08-03 DIAGNOSIS — Z681 Body mass index (BMI) 19 or less, adult: Secondary | ICD-10-CM | POA: Diagnosis not present

## 2017-08-09 ENCOUNTER — Ambulatory Visit: Payer: Medicare Other | Admitting: Nurse Practitioner

## 2017-08-17 DIAGNOSIS — Z6820 Body mass index (BMI) 20.0-20.9, adult: Secondary | ICD-10-CM | POA: Diagnosis not present

## 2017-08-17 DIAGNOSIS — R300 Dysuria: Secondary | ICD-10-CM | POA: Diagnosis not present

## 2017-08-17 DIAGNOSIS — M545 Low back pain: Secondary | ICD-10-CM | POA: Diagnosis not present

## 2017-08-31 ENCOUNTER — Encounter: Payer: Self-pay | Admitting: Nurse Practitioner

## 2017-08-31 ENCOUNTER — Ambulatory Visit (INDEPENDENT_AMBULATORY_CARE_PROVIDER_SITE_OTHER): Payer: Medicare HMO | Admitting: Nurse Practitioner

## 2017-08-31 ENCOUNTER — Other Ambulatory Visit: Payer: Self-pay | Admitting: Nurse Practitioner

## 2017-08-31 VITALS — BP 160/70 | HR 72 | Ht 62.0 in | Wt 110.4 lb

## 2017-08-31 DIAGNOSIS — I259 Chronic ischemic heart disease, unspecified: Secondary | ICD-10-CM | POA: Diagnosis not present

## 2017-08-31 DIAGNOSIS — Z961 Presence of intraocular lens: Secondary | ICD-10-CM | POA: Diagnosis not present

## 2017-08-31 DIAGNOSIS — Z9849 Cataract extraction status, unspecified eye: Secondary | ICD-10-CM | POA: Diagnosis not present

## 2017-08-31 DIAGNOSIS — H3530 Unspecified macular degeneration: Secondary | ICD-10-CM | POA: Diagnosis not present

## 2017-08-31 NOTE — Progress Notes (Signed)
CARDIOLOGY OFFICE NOTE  Date:  08/31/2017    [name] Date of Birth: 08-18-1941 Medical Record #[id number]  PCP:  [facility] (Inactive)  Cardiologist:  Jerel Shepherd    Chief Complaint  Patient presents with  . Atrial Fibrillation  . Coronary Artery Disease  . Hypertension    Follow up visit - seen for Dr. Burt Knack    History of Present Illness: [name] is a 77 y.o. female who presents today for a follow up visit. Seen for Dr. Burt Knack.   She has a very complex history. She had a prolonged hospitalization back in 2014 with respiratory failure from pneumonia and sepsis. This was associated with a NSTEMI and found to have severe LV dysfunction with EF of 20 to 25%. Cath showed NO obstructive CAD - felt to have a stress induced CM. Did have atrial fib and was put on amiodarone and Xarelto but had difficulty with anemia and guaiac positive stools. Her Xarelto was stopped and she was kept on aspirin. Her EF did recover and was 50 to 55% per echo in January of 2015 and 55% in May of 2015.   Her other issues include HLD, GERD, and HTN. She has had lots of GU/GI issues - has had kidney stones - required a percutaneous nephrostomy tube - pyelonephritis - then elevated LFTs. Taken off of amiodarone. Off of her statin. Has had prior GI bleed and is no longer on any form of anticoagulation.   I saw her last October in 2015 - she was not doing well. Seemed to be having more GI issues. Her cardiac status was felt to be stable. There was concern for a renal cell carcinoma.  She has had hyperkalemia with Aldactone.   Last seen by me a year ago - was doing ok - did get her tumor out - this was a renal cell carcinoma. Stable from our standpoint.   Comes in today. Here with her daughter. Says she is doing well. Not very active. But doing ok. No chest pain. Not short of breath. Rhythm remains ok. No bleeding. Daughter feels like she is not active - sleeps to 2 pm  sometimes. Eating only one meal a day. Appetite is poor. Has had multiple UTIs - on chronic antibiotics. Her labs are checked by her other doctors.   Past Medical History:  Diagnosis Date  . 10 year risk of MI or stroke 7.5% or greater   . Bursitis   . Diabetes mellitus ([place])   . FHx: migraine headaches   . GERD (gastroesophageal reflux disease)   . Hypercholesterolemia   . Hypertension   . Renal lithiasis   . Takotsubo cardiomyopathy 06/2013    Past Surgical History:  Procedure Laterality Date  . CHOLECYSTECTOMY    . COLONOSCOPY  06/15/2005   RMR: Minimal internal hemorrhoids. Otherwise normal rectum/Normal colon  . COLONOSCOPY    12/14/2001   RMR: Internal hemorrhage, otherwise, normal rectum/ Polyps at 30 and 35 cm resected; the remainder of colonic mucosa appeared normal  . COLONOSCOPY  05/2009   RMR: minimal hemorrhoids, sigmoid hyperplastic polyp  . COLONOSCOPY N/A 10/24/2013   JHE:RDEYCXKG colonic polyps removed as described above/Colonic diverticulosis  . ESOPHAGOGASTRODUODENOSCOPY  06/12/2007   YJE:HUDJSH esophagus/patulous EG junction, moderate sized  hiatal hernia, otherwise normal stomach, D1, D2  . ESOPHAGOGASTRODUODENOSCOPY N/A 10/24/2013   RMR: Small hiatal hernia; otherwise, normal EGD  . HIP SURGERY     right  . LEFT HEART CATHETERIZATION  WITH CORONARY ANGIOGRAM N/A 07/02/2013   Procedure: LEFT HEART CATHETERIZATION WITH CORONARY ANGIOGRAM;  Surgeon: Josue Hector, MD;  Location: [facility] CATH LAB;  Service: Cardiovascular;  Laterality: N/A;  . PARTIAL HYSTERECTOMY       Medications: Current Meds  Medication Sig  . buPROPion (WELLBUTRIN SR) 150 MG 12 hr tablet Take 150 mg by mouth daily.  . carvedilol (COREG) 6.25 MG tablet TAKE ONE TABLET TWICE DAILY A MEAL  . cholecalciferol (VITAMIN D) 1000 UNITS tablet Take 2,000 Units by mouth daily.  Marland Kitchen epoetin alfa (EPOGEN,PROCRIT) 4000 UNIT/ML injection INJECT 1ML (4000 UNITS) INTO THE SKIN THREE TIMES A WEEK  . FLUoxetine  (PROZAC) 40 MG capsule Take 40 mg by mouth daily.  . furosemide (LASIX) 20 MG tablet TAKE ONE (1) TABLET EACH DAY  . glipiZIDE (GLUCOTROL) 5 MG tablet Take 5 mg by mouth.  . Multiple Vitamins-Minerals (PRESERVISION AREDS PO) Take by mouth.  Marland Kitchen omeprazole (PRILOSEC) 20 MG capsule Take 20 mg by mouth daily.   . ondansetron (ZOFRAN) 4 MG tablet Take 4 mg by mouth every 8 (eight) hours as needed. One tab q 8 h prn for nausea, vomiting  . Probiotic Product ([place] [employer]ON HEALTH PO) Take 1 tablet by mouth daily.  Marland Kitchen sulfamethoxazole-trimethoprim (BACTRIM,SEPTRA) 400-80 MG per tablet Take 1 tablet by mouth daily.  . traMADol (ULTRAM) 50 MG tablet Take 50 mg by mouth every 6 (six) hours as needed.  . traZODone (DESYREL) 50 MG tablet Take 50 mg by mouth at bedtime.   . Zinc 50 MG TABS Take by mouth as directed.     Allergies: Allergies  Allergen Reactions  . Codeine Other (See Comments)    Reaction unknown     Social History: The patient  reports that she quit smoking about 15 years ago. Her smoking use included cigarettes. She started smoking about 58 years ago. She has a 43.00 pack-year smoking history. she has never used smokeless tobacco. She reports that she does not drink alcohol or use drugs.   Family History: The patient's family history includes Colon cancer in her mother; Pancreatic cancer (age of onset: 38) in her father.   Review of Systems: Please see the history of present illness.   Otherwise, the review of systems is positive for none.   All other systems are reviewed and negative.   Physical Exam: VS:  BP (!) 160/70 (BP Location: Left Arm, Patient Position: Sitting, Cuff Size: Normal)   Pulse 72   Ht 5\' 2"  (1.575 m)   Wt 110 lb 6.4 oz (50.1 kg)   BMI 20.19 kg/m  .  BMI Body mass index is 20.19 kg/m.  Wt Readings from Last 3 Encounters:  08/31/17 110 lb 6.4 oz (50.1 kg)  08/10/16 116 lb (52.6 kg)  07/30/15 113 lb (51.3 kg)   Bp is 124/78 by me.   General: Pleasant.  Looks chronically ill. Alert and in no acute distress. Her weight is down 6 pounds.  She is quite thin.  HEENT: Normal.  Neck: Supple, no JVD, carotid bruits, or masses noted.  Cardiac: Regular rate and rhythm. No murmurs, rubs, or gallops. No edema.  Respiratory:  Lungs are clear to auscultation bilaterally with normal work of breathing.  GI: Soft and nontender.  MS: No deformity or atrophy. Gait and ROM intact.  Skin: Warm and dry. Color is normal.  Neuro:  Strength and sensation are intact and no gross focal deficits noted.  Psych: Alert, appropriate and with normal affect.  LABORATORY DATA:  EKG:  EKG is ordered today. This demonstrates NSR.  Lab Results  Component Value Date   WBC 10.2 06/18/2014   HGB 9.6 (L) 06/18/2014   HCT 29.7 (L) 06/18/2014   PLT 201.0 06/18/2014   GLUCOSE 94 06/18/2014   ALT 32 06/18/2014   AST 98 (H) 06/18/2014   NA 140 06/18/2014   K 4.0 06/18/2014   CL 106 06/18/2014   CREATININE 1.4 (H) 06/18/2014   BUN 18 06/18/2014   CO2 23 06/18/2014   TSH 1.31 03/13/2014   INR 1.07 07/02/2013     BNP (last 3 results) No results for input(s): BNP in the last 8760 hours.  ProBNP (last 3 results) No results for input(s): PROBNP in the last 8760 hours.   Other Studies Reviewed Today:  ECHO SUMMARY MAY 2015 The left ventricular size is normal.There is mild concentric left ventricular  hypertrophy.Left ventricular systolic function is low normal.LV ejection  fraction = 50-55%.No segmental wall motion abnormalities seen in the left  ventricle.  The right ventricle is normal in size and function.  The left atrium is mildly dilated.  There is mild mitral regurgitation.  Mild pulmonary hypertension.Estimated right ventricular systolic pressure is  39 mmHg.  Small pericardial effusion.  There is no comparison study available.  Reading Physician:  [place], [id number] 01/11/2014 12:24 PM    MR KIDNEY [place] (11/15/2014  5:50 PM)  Impressions      Cystic enhancing lesion at the upper pole of right kidney remains concerning for cystic neoplasm. The size of the lesion is not significantly changed compared with study from 07/20/2014.     Minimally complex cyst interpolar left kidney. Attention on follow up.     Cirrhosis and portal hypertension.    MR KIDNEY WWO CONTRAST (11/15/2014 5:50 PM)  Narrative  MR ABDOMEN WITH AND WITHOUT CONTRAST, Nov 15, 2014 05:50:00 PM  .  INDICATION:ABDOMINAL PAIN NAUSEA, VOMITING, DIARRHEAQ61.00 Renal cyst R79.89 LFTs abnormal   COMPARISON: Renal ultrasound 08/09/2014. MRI 07/20/2014.  Marland Kitchen  TECHNIQUE: Multiplanar, multisequence MR images of the entire abdomen were obtained before and after intravenous administration of gadolinium-based contrast. 5 cc Multihance.     PROBLEM SPECIFIC FINDINGS:     Multiple simple and minimally complex T2 hyperintense cysts some with internal septations. Scarring left kidney. Hemorrhagic cyst in the upper pole left kidney.    T2 hyperintense cyst upper pole right kidney measuring 1.7 x 1.7 cm. Measuring at a similar level on prior this is essentially unchanged in size, from MRI dated 07/20/2014. This lesion contains areas of hemorrhage on the precontrast study. Lesion also demonstrates heterogeneous nodular enhancement on dynamic contrast enhanced sequences. The degree of nodular enhancement is slightly increased from prior. There is some restricted diffusion in the area of nodular enhancement.   Minimally complex cyst interpolar left kidney with a thin internal enhancing septation.         ADDITIONAL FINDINGS:  Lower Chest:  .Heart: Normal size. No pericardial effusion.  .Lungs/Pleura: No masses, consolidations, or effusions.   Vessels: Atherosclerosis of the abdominal aorta.  Abdomen:  .Liver: Cirrhotic liver morphology. Liver measures 17 cm. T2 hyperintense cyst  or hamartoma right hepatic lobe. Areas of geographic steatosis. Periportal lymph nodes in the drainage pathway of the liver. Tiny area of early arterial enhancement likely AP shunt image 57 series 13.  .Gallbladder: Cholecystectomy.   .Biliary: No obstruction.  Marland KitchenSpleen: Splenomegaly. This is likely related to portal hypertension.  .Pancreas: Normal  .Adrenals: Normal  .  Stomach/bowel: Hiatus hernia.  Musculoskeletal: Degenerative changes in the spine with posterior fixation hardware in the lumbar spine not optimally imaged by this modality.          Assessment / Plan: 1. Past stress induced CM - EF recovered per last echo from May of 2015. No symptoms of heart failure. She continues to do ok from our standpoint. Her medical issues remain more pressing.   2. PAF - off of amiodarone - - Remains in sinus by exam today and by EKG. Not a candidate for anticoagulation due to prior bleeding. I would assume she is no longer a candidate for amiodarone given her cirrhosis. Fortunately, she remains in NSR.   3. Cirrhosis - off of her statin and no longer on amiodarone. Followed by [facility]  4. Renal cell carcinoma.  Has had tumor removed. Followed by GU at [facility].  5. Probable malnutrition     Current medicines are reviewed with the patient today.  The patient does not have concerns regarding medicines other than what has been noted above.  The following changes have been made:  See above.  Labs/ tests ordered today include:    Orders Placed This Encounter  Procedures  . EKG 12-Lead     Disposition:   FU with me in 1 year.   Patient is agreeable to this plan and will call if any problems develop in the interim.   SignedTruitt Merle, NP  08/31/2017 3:22 PM  [facility] [address] [place], [place]  [address] Phone: [phone] Fax: [phone]

## 2017-08-31 NOTE — Patient Instructions (Addendum)
We will be checking the following labs today - NONE   Medication Instructions:    Continue with your current medicines.     Testing/Procedures To Be Arranged:  N/A  Follow-Up:   See me in one year    Other Special Instructions:   N/A    If you need a refill on your cardiac medications before your next appointment, please call your pharmacy.   Call the [place] office at [phone] if you have any questions, problems or concerns.

## 2017-09-02 DIAGNOSIS — N2889 Other specified disorders of kidney and ureter: Secondary | ICD-10-CM | POA: Diagnosis not present

## 2017-09-02 DIAGNOSIS — N281 Cyst of kidney, acquired: Secondary | ICD-10-CM | POA: Diagnosis not present

## 2017-09-02 DIAGNOSIS — N200 Calculus of kidney: Secondary | ICD-10-CM | POA: Diagnosis not present

## 2017-09-02 DIAGNOSIS — R161 Splenomegaly, not elsewhere classified: Secondary | ICD-10-CM | POA: Diagnosis not present

## 2017-10-05 DIAGNOSIS — R69 Illness, unspecified: Secondary | ICD-10-CM | POA: Diagnosis not present

## 2017-10-12 DIAGNOSIS — R69 Illness, unspecified: Secondary | ICD-10-CM | POA: Diagnosis not present

## 2017-11-01 DIAGNOSIS — N281 Cyst of kidney, acquired: Secondary | ICD-10-CM | POA: Diagnosis not present

## 2017-11-01 DIAGNOSIS — D529 Folate deficiency anemia, unspecified: Secondary | ICD-10-CM | POA: Diagnosis not present

## 2017-11-01 DIAGNOSIS — I10 Essential (primary) hypertension: Secondary | ICD-10-CM | POA: Diagnosis not present

## 2017-11-01 DIAGNOSIS — D519 Vitamin B12 deficiency anemia, unspecified: Secondary | ICD-10-CM | POA: Diagnosis not present

## 2017-11-01 DIAGNOSIS — E039 Hypothyroidism, unspecified: Secondary | ICD-10-CM | POA: Diagnosis not present

## 2017-11-01 DIAGNOSIS — E1122 Type 2 diabetes mellitus with diabetic chronic kidney disease: Secondary | ICD-10-CM | POA: Diagnosis not present

## 2017-11-01 DIAGNOSIS — E782 Mixed hyperlipidemia: Secondary | ICD-10-CM | POA: Diagnosis not present

## 2017-11-01 DIAGNOSIS — G6289 Other specified polyneuropathies: Secondary | ICD-10-CM | POA: Diagnosis not present

## 2017-11-01 DIAGNOSIS — R69 Illness, unspecified: Secondary | ICD-10-CM | POA: Diagnosis not present

## 2017-11-01 DIAGNOSIS — D649 Anemia, unspecified: Secondary | ICD-10-CM | POA: Diagnosis not present

## 2017-11-01 DIAGNOSIS — C641 Malignant neoplasm of right kidney, except renal pelvis: Secondary | ICD-10-CM | POA: Diagnosis not present

## 2017-11-01 DIAGNOSIS — D500 Iron deficiency anemia secondary to blood loss (chronic): Secondary | ICD-10-CM | POA: Diagnosis not present

## 2017-11-01 DIAGNOSIS — N3000 Acute cystitis without hematuria: Secondary | ICD-10-CM | POA: Diagnosis not present

## 2017-11-01 DIAGNOSIS — J301 Allergic rhinitis due to pollen: Secondary | ICD-10-CM | POA: Diagnosis not present

## 2017-11-01 DIAGNOSIS — R300 Dysuria: Secondary | ICD-10-CM | POA: Diagnosis not present

## 2017-11-01 DIAGNOSIS — N184 Chronic kidney disease, stage 4 (severe): Secondary | ICD-10-CM | POA: Diagnosis not present

## 2017-11-01 DIAGNOSIS — K219 Gastro-esophageal reflux disease without esophagitis: Secondary | ICD-10-CM | POA: Diagnosis not present

## 2017-11-24 DIAGNOSIS — R69 Illness, unspecified: Secondary | ICD-10-CM | POA: Diagnosis not present

## 2018-01-16 DIAGNOSIS — R300 Dysuria: Secondary | ICD-10-CM | POA: Diagnosis not present

## 2018-01-18 DIAGNOSIS — K7581 Nonalcoholic steatohepatitis (NASH): Secondary | ICD-10-CM | POA: Diagnosis not present

## 2018-01-18 DIAGNOSIS — L219 Seborrheic dermatitis, unspecified: Secondary | ICD-10-CM | POA: Diagnosis not present

## 2018-01-18 DIAGNOSIS — E781 Pure hyperglyceridemia: Secondary | ICD-10-CM | POA: Diagnosis not present

## 2018-01-18 DIAGNOSIS — I509 Heart failure, unspecified: Secondary | ICD-10-CM | POA: Diagnosis not present

## 2018-01-18 DIAGNOSIS — N183 Chronic kidney disease, stage 3 (moderate): Secondary | ICD-10-CM | POA: Diagnosis not present

## 2018-01-18 DIAGNOSIS — K7460 Unspecified cirrhosis of liver: Secondary | ICD-10-CM | POA: Diagnosis not present

## 2018-01-18 DIAGNOSIS — E1122 Type 2 diabetes mellitus with diabetic chronic kidney disease: Secondary | ICD-10-CM | POA: Diagnosis not present

## 2018-01-18 DIAGNOSIS — I130 Hypertensive heart and chronic kidney disease with heart failure and stage 1 through stage 4 chronic kidney disease, or unspecified chronic kidney disease: Secondary | ICD-10-CM | POA: Diagnosis not present

## 2018-01-18 DIAGNOSIS — R3915 Urgency of urination: Secondary | ICD-10-CM | POA: Diagnosis not present

## 2018-01-21 ENCOUNTER — Encounter (HOSPITAL_COMMUNITY): Payer: Self-pay | Admitting: *Deleted

## 2018-01-21 ENCOUNTER — Emergency Department (HOSPITAL_COMMUNITY)
Admission: EM | Admit: 2018-01-21 | Discharge: 2018-01-21 | Disposition: A | Discharge status: Home / Self Care | Payer: Medicare HMO | Attending: Emergency Medicine | Admitting: Emergency Medicine

## 2018-01-21 DIAGNOSIS — E7800 Pure hypercholesterolemia, unspecified: Secondary | ICD-10-CM | POA: Diagnosis not present

## 2018-01-21 DIAGNOSIS — I252 Old myocardial infarction: Secondary | ICD-10-CM | POA: Diagnosis not present

## 2018-01-21 DIAGNOSIS — I110 Hypertensive heart disease with heart failure: Secondary | ICD-10-CM | POA: Diagnosis not present

## 2018-01-21 DIAGNOSIS — E860 Dehydration: Secondary | ICD-10-CM | POA: Diagnosis not present

## 2018-01-21 DIAGNOSIS — R531 Weakness: Secondary | ICD-10-CM | POA: Diagnosis not present

## 2018-01-21 DIAGNOSIS — Z79899 Other long term (current) drug therapy: Secondary | ICD-10-CM | POA: Diagnosis not present

## 2018-01-21 DIAGNOSIS — E119 Type 2 diabetes mellitus without complications: Secondary | ICD-10-CM | POA: Insufficient documentation

## 2018-01-21 DIAGNOSIS — I5022 Chronic systolic (congestive) heart failure: Secondary | ICD-10-CM | POA: Diagnosis not present

## 2018-01-21 DIAGNOSIS — Z7984 Long term (current) use of oral hypoglycemic drugs: Secondary | ICD-10-CM | POA: Diagnosis not present

## 2018-01-21 DIAGNOSIS — R110 Nausea: Secondary | ICD-10-CM

## 2018-01-21 DIAGNOSIS — R112 Nausea with vomiting, unspecified: Secondary | ICD-10-CM | POA: Diagnosis not present

## 2018-01-21 DIAGNOSIS — Z87891 Personal history of nicotine dependence: Secondary | ICD-10-CM | POA: Diagnosis not present

## 2018-01-21 DIAGNOSIS — R404 Transient alteration of awareness: Secondary | ICD-10-CM | POA: Diagnosis not present

## 2018-01-21 LAB — CBC WITH DIFFERENTIAL/PLATELET
BASOS PCT: 0 %
Basophils Absolute: 0 10*3/uL (ref 0.0–0.1)
Eosinophils Absolute: 0 10*3/uL (ref 0.0–0.7)
Eosinophils Relative: 1 %
HCT: 33.7 % — ABNORMAL LOW (ref 36.0–46.0)
HEMOGLOBIN: 10.6 g/dL — AB (ref 12.0–15.0)
Lymphocytes Relative: 11 %
Lymphs Abs: 0.7 10*3/uL (ref 0.7–4.0)
MCH: 25.9 pg — ABNORMAL LOW (ref 26.0–34.0)
MCHC: 31.5 g/dL (ref 30.0–36.0)
MCV: 82.2 fL (ref 78.0–100.0)
MONOS PCT: 11 %
Monocytes Absolute: 0.7 10*3/uL (ref 0.1–1.0)
NEUTROS ABS: 4.9 10*3/uL (ref 1.7–7.7)
NEUTROS PCT: 77 %
PLATELETS: 89 10*3/uL — AB (ref 150–400)
RBC: 4.1 MIL/uL (ref 3.87–5.11)
RDW: 15.5 % (ref 11.5–15.5)
WBC: 6.3 10*3/uL (ref 4.0–10.5)

## 2018-01-21 LAB — COMPREHENSIVE METABOLIC PANEL
ALBUMIN: 3.2 g/dL — AB (ref 3.5–5.0)
ALT: 26 U/L (ref 14–54)
AST: 36 U/L (ref 15–41)
Alkaline Phosphatase: 137 U/L — ABNORMAL HIGH (ref 38–126)
Anion gap: 9 (ref 5–15)
BUN: 28 mg/dL — AB (ref 6–20)
CHLORIDE: 101 mmol/L (ref 101–111)
CO2: 21 mmol/L — AB (ref 22–32)
CREATININE: 2.28 mg/dL — AB (ref 0.44–1.00)
Calcium: 8.8 mg/dL — ABNORMAL LOW (ref 8.9–10.3)
GFR calc Af Amer: 23 mL/min — ABNORMAL LOW (ref >60)
GFR calc non Af Amer: 20 mL/min — ABNORMAL LOW (ref >60)
Glucose, Bld: 178 mg/dL — ABNORMAL HIGH (ref 65–99)
Potassium: 4.7 mmol/L (ref 3.5–5.1)
SODIUM: 131 mmol/L — AB (ref 135–145)
Total Bilirubin: 1 mg/dL (ref 0.3–1.2)
Total Protein: 6.7 g/dL (ref 6.5–8.1)

## 2018-01-21 LAB — CK: CK TOTAL: 64 U/L (ref 38–234)

## 2018-01-21 LAB — URINALYSIS, ROUTINE W REFLEX MICROSCOPIC
BILIRUBIN URINE: NEGATIVE
Bacteria, UA: NONE SEEN
Glucose, UA: 50 mg/dL — AB
Hgb urine dipstick: NEGATIVE
Ketones, ur: 5 mg/dL — AB
NITRITE: NEGATIVE
Protein, ur: NEGATIVE mg/dL
SPECIFIC GRAVITY, URINE: 1.009 (ref 1.005–1.030)
pH: 6 (ref 5.0–8.0)

## 2018-01-21 LAB — TROPONIN I

## 2018-01-21 MED ORDER — SODIUM CHLORIDE 0.9 % IV BOLUS
1000.0000 mL | Freq: Once | INTRAVENOUS | Status: AC
  Administered 2018-01-21: 1000 mL via INTRAVENOUS

## 2018-01-21 MED ORDER — SODIUM CHLORIDE 0.9 % IV SOLN
Freq: Once | INTRAVENOUS | Status: AC
  Administered 2018-01-21: 15:00:00 via INTRAVENOUS

## 2018-01-21 MED ORDER — ONDANSETRON 4 MG PO TBDP: 4.0000 mg | ORAL_TABLET | Freq: Three times a day (TID) | ORAL | 0 refills | Status: DC | PRN

## 2018-01-21 MED ORDER — ONDANSETRON HCL 4 MG/2ML IJ SOLN
4.0000 mg | Freq: Once | INTRAMUSCULAR | Status: AC
  Administered 2018-01-21: 4 mg via INTRAVENOUS
  Filled 2018-01-21: qty 2

## 2018-01-21 NOTE — ED Provider Notes (Signed)
[facility] EMERGENCY DEPARTMENT Provider Note   CSN: [id number] Arrival date & time: 01/21/18  1310     History   Chief Complaint Chief Complaint  Patient presents with  . Weakness  . Nausea    HPI [name] is a 77 y.o. female.  Complaint is recent urinary tract infection, vomiting, weakness  HPI: 77 year old female.  States she was diagnosed with a UTI by her primary care physician on Tuesday, 4 days ago.  Was prescribed an unknown antibiotic.  Has been on it for 3 days.  However, states been vomiting for the last 2 days.  She feels generalized weakness.  She stood at home this morning and felt lightheaded as though she could fall.  She was brought in via EMS for evaluation.  Diarrhea.  No fever.  Heme-negative nonbilious emesis.  2-3 episodes per day.  Past Medical History:  Diagnosis Date  . 10 year risk of MI or stroke 7.5% or greater   . Bursitis   . Diabetes mellitus ([place])   . FHx: migraine headaches   . GERD (gastroesophageal reflux disease)   . Hypercholesterolemia   . Hypertension   . Renal lithiasis   . Takotsubo cardiomyopathy 06/2013    Patient Active Problem List   Diagnosis Date Noted  . Melena 10/08/2013  . Cardiomyopathy ([place]) 07/12/2013  . Physical deconditioning 07/06/2013  . NSTEMI (non-ST elevated myocardial infarction) ([place]) 07/02/2013  . Community acquired pneumonia 07/02/2013  . Sepsis(995.91) 07/02/2013  . Acute respiratory failure ([place]) 07/02/2013  . Acute systolic CHF (congestive heart failure) ([place]) 07/02/2013  . 10 year risk of MI or stroke 7.5% or greater   . HEMOCCULT POSITIVE STOOL 05/15/2009  . ANEMIA, IRON DEFICIENCY, HX OF 05/15/2009  . DM 05/14/2009  . HYPERCHOLESTEROLEMIA 05/14/2009  . MIGRAINE HEADACHE 05/14/2009  . HYPERTENSION 05/14/2009  . GERD 05/14/2009  . CHOLELITHIASIS 05/14/2009  . NEPHROLITHIASIS 05/14/2009  . BURSITIS 05/14/2009    Past Surgical History:  Procedure Laterality Date  .  CHOLECYSTECTOMY    . COLONOSCOPY  06/15/2005   RMR: Minimal internal hemorrhoids. Otherwise normal rectum/Normal colon  . COLONOSCOPY    12/14/2001   RMR: Internal hemorrhage, otherwise, normal rectum/ Polyps at 30 and 35 cm resected; the remainder of colonic mucosa appeared normal  . COLONOSCOPY  05/2009   RMR: minimal hemorrhoids, sigmoid hyperplastic polyp  . COLONOSCOPY N/A 10/24/2013   OMV:EHMCNOBS colonic polyps removed as described above/Colonic diverticulosis  . ESOPHAGOGASTRODUODENOSCOPY  06/12/2007   JGG:EZMOQH esophagus/patulous EG junction, moderate sized  hiatal hernia, otherwise normal stomach, D1, D2  . ESOPHAGOGASTRODUODENOSCOPY N/A 10/24/2013   RMR: Small hiatal hernia; otherwise, normal EGD  . HIP SURGERY     right  . LEFT HEART CATHETERIZATION WITH CORONARY ANGIOGRAM N/A 07/02/2013   Procedure: LEFT HEART CATHETERIZATION WITH CORONARY ANGIOGRAM;  Surgeon: Josue Hector, MD;  Location: [facility] CATH LAB;  Service: Cardiovascular;  Laterality: N/A;  . PARTIAL HYSTERECTOMY       OB History   None      Home Medications    Prior to Admission medications   Medication Sig Start Date End Date Taking? Authorizing Provider  buPROPion (WELLBUTRIN SR) 150 MG 12 hr tablet Take 150 mg by mouth daily.   Yes [provider]  carvedilol (COREG) 6.25 MG tablet TAKE ONE TABLET TWICE DAILY WITH A MEAL 09/01/17  Yes Burtis Junes, NP  cholecalciferol (VITAMIN D) 1000 UNITS tablet Take 2,000 Units by mouth daily.   Yes [provider]  epoetin alfa (EPOGEN,PROCRIT) 4000 UNIT/ML injection INJECT 1ML (4000 UNITS) INTO THE SKIN THREE TIMES A WEEK 06/22/17  Yes [provider]  FLUoxetine (PROZAC) 40 MG capsule Take 40 mg by mouth daily.   Yes [provider]  furosemide (LASIX) 20 MG tablet TAKE ONE (1) TABLET EACH DAY 09/19/14  Yes Sherren Mocha, MD  glipiZIDE (GLUCOTROL) 5 MG tablet Take 5 mg by mouth.   Yes [provider]  Multiple  Vitamins-Minerals (PRESERVISION AREDS PO) Take by mouth.   Yes [provider]  omeprazole (PRILOSEC) 20 MG capsule Take 20 mg by mouth daily.    Yes [provider]  ondansetron (ZOFRAN) 4 MG tablet Take 4 mg by mouth every 8 (eight) hours as needed. One tab q 8 h prn for nausea, vomiting 07/24/14  Yes [provider]  Probiotic Product ([place]) Take 1 tablet by mouth daily.   Yes [provider]  sulfamethoxazole-trimethoprim (BACTRIM,SEPTRA) 400-80 MG per tablet Take 1 tablet by mouth daily.   Yes [provider]  traMADol (ULTRAM) 50 MG tablet Take 50 mg by mouth every 6 (six) hours as needed.   Yes [provider]  traZODone (DESYREL) 50 MG tablet Take 50 mg by mouth at bedtime.    Yes [provider]  Zinc 50 MG TABS Take by mouth as directed.   Yes [provider]  ondansetron (ZOFRAN ODT) 4 MG disintegrating tablet Take 1 tablet (4 mg total) by mouth every 8 (eight) hours as needed for nausea. 01/21/18   Tanna Furry, MD    Family History Family History  Problem Relation Age of Onset  . Pancreatic cancer Father 25  . Colon cancer Mother        late 17s    Social History Social History   Tobacco Use  . Smoking status: Former Smoker    Packs/day: 1.00    Years: 43.00    Pack years: 43.00    Types: Cigarettes    Start date: 08/31/1959    Last attempt to quit: 08/30/2002    Years since quitting: 15.4  . Smokeless tobacco: Never Used  . Tobacco comment: Quit x 10 years  Substance Use Topics  . Alcohol use: No  . Drug use: No     Allergies   Codeine   Review of Systems Review of Systems  Constitutional: Negative for appetite change, chills, diaphoresis, fatigue and fever.  HENT: Negative for mouth sores, sore throat and trouble swallowing.   Eyes: Negative for visual disturbance.  Respiratory: Negative for cough, chest tightness, shortness of breath and wheezing.   Cardiovascular:  Negative for chest pain.  Gastrointestinal: Positive for nausea and vomiting. Negative for abdominal distention, abdominal pain and diarrhea.  Endocrine: Negative for polydipsia, polyphagia and polyuria.  Genitourinary: Negative for dysuria, frequency and hematuria.  Musculoskeletal: Negative for gait problem.  Skin: Negative for color change, pallor and rash.  Neurological: Negative for dizziness, syncope, light-headedness and headaches.  Hematological: Does not bruise/bleed easily.  Psychiatric/Behavioral: Negative for behavioral problems and confusion.     Physical Exam Updated Vital Signs BP (!) 155/71 (BP Location: Right Arm)   Pulse 88   Temp 98.1 F (36.7 C) (Oral)   Resp 16   Ht 5\' 2"  (1.575 m)   Wt 49.9 kg (110 lb)   SpO2 96%   BMI 20.12 kg/m   Physical Exam  Constitutional: She is oriented to person, place, and time. She appears well-developed and well-nourished. No distress.  Appears to not feel well.  Conjunctive a not pale.  Mucous membranes slightly dried.  HENT:  Head: Normocephalic.  Eyes: Pupils are equal, round, and reactive to light. Conjunctivae are normal. No scleral icterus.  Neck: Normal range of motion. Neck supple. No thyromegaly present.  Cardiovascular: Normal rate and regular rhythm. Exam reveals no gallop and no friction rub.  No murmur heard. Pulmonary/Chest: Effort normal and breath sounds normal. No respiratory distress. She has no wheezes. She has no rales.  Abdominal: Soft. Bowel sounds are normal. She exhibits no distension. There is no tenderness. There is no rebound.  Musculoskeletal: Normal range of motion.  Neurological: She is alert and oriented to person, place, and time.  Skin: Skin is warm and dry. No rash noted.  Psychiatric: She has a normal mood and affect. Her behavior is normal.     ED Treatments / Results  Labs (all labs ordered are listed, but only abnormal results are displayed) Labs Reviewed  CBC WITH  DIFFERENTIAL/PLATELET - Abnormal; Notable for the following components:      Result Value   Hemoglobin 10.6 (*)    HCT 33.7 (*)    MCH 25.9 (*)    Platelets 89 (*)    All other components within normal limits  COMPREHENSIVE METABOLIC PANEL - Abnormal; Notable for the following components:   Sodium 131 (*)    CO2 21 (*)    Glucose, Bld 178 (*)    BUN 28 (*)    Creatinine, Ser 2.28 (*)    Calcium 8.8 (*)    Albumin 3.2 (*)    Alkaline Phosphatase 137 (*)    GFR calc non Af Amer 20 (*)    GFR calc Af Amer 23 (*)    All other components within normal limits  URINALYSIS, ROUTINE W REFLEX MICROSCOPIC - Abnormal; Notable for the following components:   Glucose, UA 50 (*)    Ketones, ur 5 (*)    Leukocytes, UA TRACE (*)    All other components within normal limits  URINE CULTURE  TROPONIN I  CK    EKG None  Radiology No results found.  Procedures Procedures (including critical care time)  Medications Ordered in ED Medications  sodium chloride 0.9 % bolus 1,000 mL (0 mLs Intravenous Stopped 01/21/18 1500)  0.9 %  sodium chloride infusion ( Intravenous New Bag/Given 01/21/18 1500)  ondansetron (ZOFRAN) injection 4 mg (4 mg Intravenous Given 01/21/18 1413)     Initial Impression / Assessment and Plan / ED Course  I have reviewed the triage vital signs and the nursing notes.  Pertinent labs & imaging results that were available during my care of the patient were reviewed by me and considered in my medical decision making (see chart for details).    Still has some cellularity in her urine.  Nitrate negative.  Are pending.  No clear indications for ongoing antibiotic treatment.  Given normal saline liter.  Is ambulatory here.  Taking p.o.  We discussed admission versus home treatment.  She has a strong desire to be at home.  Her creatinine is elevated 2.2 above her Lasix baseline 1.7.  She is taking p.o.  I think she is appropriate to go home, continue oral rehydration, and recheck  labs with physician next week.  Please of her nausea or vomiting her inability to keep fluids down return she will recheck here.  Final Clinical Impressions(s) / ED Diagnoses   Final diagnoses:  Nausea  Generalized weakness  Dehydration  ED Discharge Orders        Ordered    ondansetron (ZOFRAN ODT) 4 MG disintegrating tablet  Every 8 hours PRN     01/21/18 1554       Tanna Furry, MD 01/21/18 1559

## 2018-01-21 NOTE — Discharge Instructions (Addendum)
Stop your antibiotic.  Your urine does not appear obviously infected.  If your culture shows need for additional treatment, you will be contacted.  Fluids to stay hydrated.  Zofran for nausea.  Major activity until your strength is improved.

## 2018-01-21 NOTE — ED Notes (Signed)
Fluids given to patient.  Pt ambulated to bathroom. Still very weak.  Was unstable on her feet.

## 2018-01-21 NOTE — ED Triage Notes (Signed)
Patient complains of weakness to legs beginning last night, nausea and vomiting for 2 days.

## 2018-01-22 LAB — URINE CULTURE: Culture: <10000 — AB

## 2018-01-31 DIAGNOSIS — D649 Anemia, unspecified: Secondary | ICD-10-CM | POA: Diagnosis not present

## 2018-01-31 DIAGNOSIS — N184 Chronic kidney disease, stage 4 (severe): Secondary | ICD-10-CM | POA: Diagnosis not present

## 2018-01-31 DIAGNOSIS — C641 Malignant neoplasm of right kidney, except renal pelvis: Secondary | ICD-10-CM | POA: Diagnosis not present

## 2018-01-31 DIAGNOSIS — G6289 Other specified polyneuropathies: Secondary | ICD-10-CM | POA: Diagnosis not present

## 2018-01-31 DIAGNOSIS — Z681 Body mass index (BMI) 19 or less, adult: Secondary | ICD-10-CM | POA: Diagnosis not present

## 2018-01-31 DIAGNOSIS — E782 Mixed hyperlipidemia: Secondary | ICD-10-CM | POA: Diagnosis not present

## 2018-01-31 DIAGNOSIS — E1122 Type 2 diabetes mellitus with diabetic chronic kidney disease: Secondary | ICD-10-CM | POA: Diagnosis not present

## 2018-01-31 DIAGNOSIS — I10 Essential (primary) hypertension: Secondary | ICD-10-CM | POA: Diagnosis not present

## 2018-02-07 DIAGNOSIS — K7460 Unspecified cirrhosis of liver: Secondary | ICD-10-CM | POA: Diagnosis not present

## 2018-02-07 DIAGNOSIS — N200 Calculus of kidney: Secondary | ICD-10-CM | POA: Diagnosis not present

## 2018-02-07 DIAGNOSIS — N281 Cyst of kidney, acquired: Secondary | ICD-10-CM | POA: Diagnosis not present

## 2018-02-07 DIAGNOSIS — R188 Other ascites: Secondary | ICD-10-CM | POA: Diagnosis not present

## 2018-03-14 DIAGNOSIS — R69 Illness, unspecified: Secondary | ICD-10-CM | POA: Diagnosis not present

## 2018-03-28 DIAGNOSIS — K210 Gastro-esophageal reflux disease with esophagitis: Secondary | ICD-10-CM | POA: Diagnosis not present

## 2018-03-28 DIAGNOSIS — E1165 Type 2 diabetes mellitus with hyperglycemia: Secondary | ICD-10-CM | POA: Diagnosis not present

## 2018-03-28 DIAGNOSIS — R69 Illness, unspecified: Secondary | ICD-10-CM | POA: Diagnosis not present

## 2018-03-28 DIAGNOSIS — I10 Essential (primary) hypertension: Secondary | ICD-10-CM | POA: Diagnosis not present

## 2018-05-08 DIAGNOSIS — Z6821 Body mass index (BMI) 21.0-21.9, adult: Secondary | ICD-10-CM | POA: Diagnosis not present

## 2018-05-08 DIAGNOSIS — R5383 Other fatigue: Secondary | ICD-10-CM | POA: Diagnosis not present

## 2018-05-08 DIAGNOSIS — E1122 Type 2 diabetes mellitus with diabetic chronic kidney disease: Secondary | ICD-10-CM | POA: Diagnosis not present

## 2018-05-10 DIAGNOSIS — D649 Anemia, unspecified: Secondary | ICD-10-CM | POA: Diagnosis not present

## 2018-05-11 DIAGNOSIS — D649 Anemia, unspecified: Secondary | ICD-10-CM | POA: Diagnosis not present

## 2018-05-16 DIAGNOSIS — E0590 Thyrotoxicosis, unspecified without thyrotoxic crisis or storm: Secondary | ICD-10-CM | POA: Diagnosis not present

## 2018-05-17 DIAGNOSIS — E0590 Thyrotoxicosis, unspecified without thyrotoxic crisis or storm: Secondary | ICD-10-CM | POA: Diagnosis not present

## 2018-05-18 DIAGNOSIS — E1122 Type 2 diabetes mellitus with diabetic chronic kidney disease: Secondary | ICD-10-CM | POA: Diagnosis not present

## 2018-05-18 DIAGNOSIS — N184 Chronic kidney disease, stage 4 (severe): Secondary | ICD-10-CM | POA: Diagnosis not present

## 2018-05-18 DIAGNOSIS — E1165 Type 2 diabetes mellitus with hyperglycemia: Secondary | ICD-10-CM | POA: Diagnosis not present

## 2018-05-18 DIAGNOSIS — C641 Malignant neoplasm of right kidney, except renal pelvis: Secondary | ICD-10-CM | POA: Diagnosis not present

## 2018-05-18 DIAGNOSIS — R7989 Other specified abnormal findings of blood chemistry: Secondary | ICD-10-CM | POA: Diagnosis not present

## 2018-05-18 DIAGNOSIS — R5383 Other fatigue: Secondary | ICD-10-CM | POA: Diagnosis not present

## 2018-05-18 DIAGNOSIS — I129 Hypertensive chronic kidney disease with stage 1 through stage 4 chronic kidney disease, or unspecified chronic kidney disease: Secondary | ICD-10-CM | POA: Diagnosis not present

## 2018-05-19 DIAGNOSIS — Z6820 Body mass index (BMI) 20.0-20.9, adult: Secondary | ICD-10-CM | POA: Diagnosis not present

## 2018-05-19 DIAGNOSIS — E0500 Thyrotoxicosis with diffuse goiter without thyrotoxic crisis or storm: Secondary | ICD-10-CM | POA: Diagnosis not present

## 2018-05-19 DIAGNOSIS — Z23 Encounter for immunization: Secondary | ICD-10-CM | POA: Diagnosis not present

## 2018-05-26 DIAGNOSIS — I10 Essential (primary) hypertension: Secondary | ICD-10-CM | POA: Diagnosis not present

## 2018-05-26 DIAGNOSIS — E1165 Type 2 diabetes mellitus with hyperglycemia: Secondary | ICD-10-CM | POA: Diagnosis not present

## 2018-05-26 DIAGNOSIS — Z0001 Encounter for general adult medical examination with abnormal findings: Secondary | ICD-10-CM | POA: Diagnosis not present

## 2018-05-26 DIAGNOSIS — E782 Mixed hyperlipidemia: Secondary | ICD-10-CM | POA: Diagnosis not present

## 2018-05-26 DIAGNOSIS — I480 Paroxysmal atrial fibrillation: Secondary | ICD-10-CM | POA: Diagnosis not present

## 2018-05-26 DIAGNOSIS — Z9189 Other specified personal risk factors, not elsewhere classified: Secondary | ICD-10-CM | POA: Diagnosis not present

## 2018-05-26 DIAGNOSIS — E875 Hyperkalemia: Secondary | ICD-10-CM | POA: Diagnosis not present

## 2018-05-26 DIAGNOSIS — D519 Vitamin B12 deficiency anemia, unspecified: Secondary | ICD-10-CM | POA: Diagnosis not present

## 2018-05-26 DIAGNOSIS — Z6820 Body mass index (BMI) 20.0-20.9, adult: Secondary | ICD-10-CM | POA: Diagnosis not present

## 2018-05-26 DIAGNOSIS — R5383 Other fatigue: Secondary | ICD-10-CM | POA: Diagnosis not present

## 2018-05-26 DIAGNOSIS — K210 Gastro-esophageal reflux disease with esophagitis: Secondary | ICD-10-CM | POA: Diagnosis not present

## 2018-05-26 DIAGNOSIS — N184 Chronic kidney disease, stage 4 (severe): Secondary | ICD-10-CM | POA: Diagnosis not present

## 2018-05-26 DIAGNOSIS — D649 Anemia, unspecified: Secondary | ICD-10-CM | POA: Diagnosis not present

## 2018-05-29 DIAGNOSIS — R5383 Other fatigue: Secondary | ICD-10-CM | POA: Diagnosis not present

## 2018-05-29 DIAGNOSIS — E1122 Type 2 diabetes mellitus with diabetic chronic kidney disease: Secondary | ICD-10-CM | POA: Diagnosis not present

## 2018-06-03 DIAGNOSIS — E782 Mixed hyperlipidemia: Secondary | ICD-10-CM | POA: Diagnosis not present

## 2018-06-03 DIAGNOSIS — D649 Anemia, unspecified: Secondary | ICD-10-CM | POA: Diagnosis not present

## 2018-06-03 DIAGNOSIS — C641 Malignant neoplasm of right kidney, except renal pelvis: Secondary | ICD-10-CM | POA: Diagnosis not present

## 2018-06-03 DIAGNOSIS — Z0001 Encounter for general adult medical examination with abnormal findings: Secondary | ICD-10-CM | POA: Diagnosis not present

## 2018-06-03 DIAGNOSIS — G6289 Other specified polyneuropathies: Secondary | ICD-10-CM | POA: Diagnosis not present

## 2018-06-03 DIAGNOSIS — E1122 Type 2 diabetes mellitus with diabetic chronic kidney disease: Secondary | ICD-10-CM | POA: Diagnosis not present

## 2018-06-03 DIAGNOSIS — N184 Chronic kidney disease, stage 4 (severe): Secondary | ICD-10-CM | POA: Diagnosis not present

## 2018-06-03 DIAGNOSIS — I10 Essential (primary) hypertension: Secondary | ICD-10-CM | POA: Diagnosis not present

## 2018-06-06 DIAGNOSIS — R69 Illness, unspecified: Secondary | ICD-10-CM | POA: Diagnosis not present

## 2018-06-07 ENCOUNTER — Other Ambulatory Visit (HOSPITAL_COMMUNITY): Payer: Self-pay | Admitting: Family Medicine

## 2018-06-07 DIAGNOSIS — M810 Age-related osteoporosis without current pathological fracture: Secondary | ICD-10-CM

## 2018-06-22 ENCOUNTER — Ambulatory Visit (HOSPITAL_COMMUNITY)
Admission: RE | Admit: 2018-06-22 | Discharge: 2018-06-22 | Disposition: A | Discharge status: Home / Self Care | Payer: Medicare HMO | Source: Ambulatory Visit | Attending: Family Medicine | Admitting: Family Medicine

## 2018-06-22 DIAGNOSIS — D529 Folate deficiency anemia, unspecified: Secondary | ICD-10-CM | POA: Diagnosis not present

## 2018-06-22 DIAGNOSIS — D500 Iron deficiency anemia secondary to blood loss (chronic): Secondary | ICD-10-CM | POA: Diagnosis not present

## 2018-06-22 DIAGNOSIS — J301 Allergic rhinitis due to pollen: Secondary | ICD-10-CM | POA: Diagnosis not present

## 2018-06-22 DIAGNOSIS — R5383 Other fatigue: Secondary | ICD-10-CM | POA: Diagnosis not present

## 2018-06-22 DIAGNOSIS — M810 Age-related osteoporosis without current pathological fracture: Secondary | ICD-10-CM | POA: Insufficient documentation

## 2018-06-22 DIAGNOSIS — K219 Gastro-esophageal reflux disease without esophagitis: Secondary | ICD-10-CM | POA: Diagnosis not present

## 2018-06-22 DIAGNOSIS — G6289 Other specified polyneuropathies: Secondary | ICD-10-CM | POA: Diagnosis not present

## 2018-06-22 DIAGNOSIS — N184 Chronic kidney disease, stage 4 (severe): Secondary | ICD-10-CM | POA: Diagnosis not present

## 2018-06-22 DIAGNOSIS — I10 Essential (primary) hypertension: Secondary | ICD-10-CM | POA: Diagnosis not present

## 2018-06-22 DIAGNOSIS — E1122 Type 2 diabetes mellitus with diabetic chronic kidney disease: Secondary | ICD-10-CM | POA: Diagnosis not present

## 2018-06-22 DIAGNOSIS — K7581 Nonalcoholic steatohepatitis (NASH): Secondary | ICD-10-CM | POA: Diagnosis not present

## 2018-06-22 DIAGNOSIS — D519 Vitamin B12 deficiency anemia, unspecified: Secondary | ICD-10-CM | POA: Diagnosis not present

## 2018-06-22 DIAGNOSIS — K7460 Unspecified cirrhosis of liver: Secondary | ICD-10-CM | POA: Diagnosis not present

## 2018-06-22 DIAGNOSIS — D539 Nutritional anemia, unspecified: Secondary | ICD-10-CM | POA: Diagnosis not present

## 2018-06-22 DIAGNOSIS — D649 Anemia, unspecified: Secondary | ICD-10-CM | POA: Diagnosis not present

## 2018-06-22 DIAGNOSIS — E0500 Thyrotoxicosis with diffuse goiter without thyrotoxic crisis or storm: Secondary | ICD-10-CM | POA: Diagnosis not present

## 2018-06-22 DIAGNOSIS — E782 Mixed hyperlipidemia: Secondary | ICD-10-CM | POA: Diagnosis not present

## 2018-06-22 DIAGNOSIS — C641 Malignant neoplasm of right kidney, except renal pelvis: Secondary | ICD-10-CM | POA: Diagnosis not present

## 2018-06-26 DIAGNOSIS — R110 Nausea: Secondary | ICD-10-CM | POA: Diagnosis not present

## 2018-06-26 DIAGNOSIS — D649 Anemia, unspecified: Secondary | ICD-10-CM | POA: Diagnosis not present

## 2018-06-26 DIAGNOSIS — K7460 Unspecified cirrhosis of liver: Secondary | ICD-10-CM | POA: Diagnosis not present

## 2018-06-26 DIAGNOSIS — K7581 Nonalcoholic steatohepatitis (NASH): Secondary | ICD-10-CM | POA: Diagnosis not present

## 2018-06-26 DIAGNOSIS — R8290 Unspecified abnormal findings in urine: Secondary | ICD-10-CM | POA: Diagnosis not present

## 2018-06-26 DIAGNOSIS — N200 Calculus of kidney: Secondary | ICD-10-CM | POA: Diagnosis not present

## 2018-06-26 DIAGNOSIS — N3090 Cystitis, unspecified without hematuria: Secondary | ICD-10-CM | POA: Diagnosis not present

## 2018-06-26 DIAGNOSIS — N189 Chronic kidney disease, unspecified: Secondary | ICD-10-CM | POA: Diagnosis not present

## 2018-06-26 DIAGNOSIS — R319 Hematuria, unspecified: Secondary | ICD-10-CM | POA: Diagnosis not present

## 2018-06-26 DIAGNOSIS — E871 Hypo-osmolality and hyponatremia: Secondary | ICD-10-CM | POA: Diagnosis not present

## 2018-06-26 DIAGNOSIS — Z960 Presence of urogenital implants: Secondary | ICD-10-CM | POA: Diagnosis not present

## 2018-06-26 DIAGNOSIS — E1122 Type 2 diabetes mellitus with diabetic chronic kidney disease: Secondary | ICD-10-CM | POA: Diagnosis not present

## 2018-06-26 DIAGNOSIS — I130 Hypertensive heart and chronic kidney disease with heart failure and stage 1 through stage 4 chronic kidney disease, or unspecified chronic kidney disease: Secondary | ICD-10-CM | POA: Diagnosis not present

## 2018-06-26 DIAGNOSIS — I110 Hypertensive heart disease with heart failure: Secondary | ICD-10-CM | POA: Diagnosis not present

## 2018-06-26 DIAGNOSIS — Z87442 Personal history of urinary calculi: Secondary | ICD-10-CM | POA: Diagnosis not present

## 2018-06-26 DIAGNOSIS — N170 Acute kidney failure with tubular necrosis: Secondary | ICD-10-CM | POA: Diagnosis not present

## 2018-06-26 DIAGNOSIS — E0500 Thyrotoxicosis with diffuse goiter without thyrotoxic crisis or storm: Secondary | ICD-10-CM | POA: Diagnosis not present

## 2018-06-26 DIAGNOSIS — I5030 Unspecified diastolic (congestive) heart failure: Secondary | ICD-10-CM | POA: Diagnosis not present

## 2018-06-26 DIAGNOSIS — Z7984 Long term (current) use of oral hypoglycemic drugs: Secondary | ICD-10-CM | POA: Diagnosis not present

## 2018-06-26 DIAGNOSIS — R188 Other ascites: Secondary | ICD-10-CM | POA: Diagnosis not present

## 2018-06-26 DIAGNOSIS — N179 Acute kidney failure, unspecified: Secondary | ICD-10-CM | POA: Diagnosis not present

## 2018-06-26 DIAGNOSIS — E872 Acidosis: Secondary | ICD-10-CM | POA: Diagnosis not present

## 2018-06-26 DIAGNOSIS — N390 Urinary tract infection, site not specified: Secondary | ICD-10-CM | POA: Diagnosis not present

## 2018-06-26 DIAGNOSIS — B964 Proteus (mirabilis) (morganii) as the cause of diseases classified elsewhere: Secondary | ICD-10-CM | POA: Diagnosis not present

## 2018-06-27 DIAGNOSIS — R319 Hematuria, unspecified: Secondary | ICD-10-CM | POA: Diagnosis not present

## 2018-06-27 DIAGNOSIS — N189 Chronic kidney disease, unspecified: Secondary | ICD-10-CM | POA: Diagnosis not present

## 2018-06-27 DIAGNOSIS — K7581 Nonalcoholic steatohepatitis (NASH): Secondary | ICD-10-CM | POA: Diagnosis not present

## 2018-06-27 DIAGNOSIS — Z681 Body mass index (BMI) 19 or less, adult: Secondary | ICD-10-CM | POA: Diagnosis not present

## 2018-06-27 DIAGNOSIS — R110 Nausea: Secondary | ICD-10-CM | POA: Diagnosis not present

## 2018-06-27 DIAGNOSIS — R9431 Abnormal electrocardiogram [ECG] [EKG]: Secondary | ICD-10-CM | POA: Diagnosis not present

## 2018-06-27 DIAGNOSIS — E440 Moderate protein-calorie malnutrition: Secondary | ICD-10-CM | POA: Diagnosis not present

## 2018-06-27 DIAGNOSIS — I5032 Chronic diastolic (congestive) heart failure: Secondary | ICD-10-CM | POA: Diagnosis not present

## 2018-06-27 DIAGNOSIS — E1165 Type 2 diabetes mellitus with hyperglycemia: Secondary | ICD-10-CM | POA: Diagnosis not present

## 2018-06-27 DIAGNOSIS — N3090 Cystitis, unspecified without hematuria: Secondary | ICD-10-CM | POA: Diagnosis not present

## 2018-06-27 DIAGNOSIS — N179 Acute kidney failure, unspecified: Secondary | ICD-10-CM | POA: Diagnosis not present

## 2018-06-27 DIAGNOSIS — I10 Essential (primary) hypertension: Secondary | ICD-10-CM | POA: Diagnosis not present

## 2018-06-27 DIAGNOSIS — N170 Acute kidney failure with tubular necrosis: Secondary | ICD-10-CM | POA: Diagnosis not present

## 2018-06-27 DIAGNOSIS — I5030 Unspecified diastolic (congestive) heart failure: Secondary | ICD-10-CM | POA: Diagnosis not present

## 2018-06-27 DIAGNOSIS — Z87442 Personal history of urinary calculi: Secondary | ICD-10-CM | POA: Diagnosis not present

## 2018-06-27 DIAGNOSIS — E0500 Thyrotoxicosis with diffuse goiter without thyrotoxic crisis or storm: Secondary | ICD-10-CM | POA: Diagnosis not present

## 2018-06-27 DIAGNOSIS — Z85528 Personal history of other malignant neoplasm of kidney: Secondary | ICD-10-CM | POA: Diagnosis not present

## 2018-06-27 DIAGNOSIS — E1122 Type 2 diabetes mellitus with diabetic chronic kidney disease: Secondary | ICD-10-CM | POA: Diagnosis not present

## 2018-06-27 DIAGNOSIS — B964 Proteus (mirabilis) (morganii) as the cause of diseases classified elsewhere: Secondary | ICD-10-CM | POA: Diagnosis not present

## 2018-06-27 DIAGNOSIS — K219 Gastro-esophageal reflux disease without esophagitis: Secondary | ICD-10-CM | POA: Diagnosis not present

## 2018-06-27 DIAGNOSIS — E119 Type 2 diabetes mellitus without complications: Secondary | ICD-10-CM | POA: Diagnosis not present

## 2018-06-27 DIAGNOSIS — N184 Chronic kidney disease, stage 4 (severe): Secondary | ICD-10-CM | POA: Diagnosis not present

## 2018-06-27 DIAGNOSIS — N390 Urinary tract infection, site not specified: Secondary | ICD-10-CM | POA: Diagnosis not present

## 2018-06-27 DIAGNOSIS — Z960 Presence of urogenital implants: Secondary | ICD-10-CM | POA: Diagnosis not present

## 2018-06-27 DIAGNOSIS — K7469 Other cirrhosis of liver: Secondary | ICD-10-CM | POA: Diagnosis not present

## 2018-06-27 DIAGNOSIS — R8290 Unspecified abnormal findings in urine: Secondary | ICD-10-CM | POA: Diagnosis not present

## 2018-06-27 DIAGNOSIS — Z7984 Long term (current) use of oral hypoglycemic drugs: Secondary | ICD-10-CM | POA: Diagnosis not present

## 2018-06-27 DIAGNOSIS — E872 Acidosis: Secondary | ICD-10-CM | POA: Diagnosis not present

## 2018-06-27 DIAGNOSIS — N200 Calculus of kidney: Secondary | ICD-10-CM | POA: Diagnosis not present

## 2018-06-27 DIAGNOSIS — R69 Illness, unspecified: Secondary | ICD-10-CM | POA: Diagnosis not present

## 2018-06-27 DIAGNOSIS — I130 Hypertensive heart and chronic kidney disease with heart failure and stage 1 through stage 4 chronic kidney disease, or unspecified chronic kidney disease: Secondary | ICD-10-CM | POA: Diagnosis not present

## 2018-06-27 DIAGNOSIS — D649 Anemia, unspecified: Secondary | ICD-10-CM | POA: Diagnosis not present

## 2018-06-27 DIAGNOSIS — E871 Hypo-osmolality and hyponatremia: Secondary | ICD-10-CM | POA: Diagnosis not present

## 2018-06-27 DIAGNOSIS — I110 Hypertensive heart disease with heart failure: Secondary | ICD-10-CM | POA: Diagnosis not present

## 2018-06-28 DIAGNOSIS — E0500 Thyrotoxicosis with diffuse goiter without thyrotoxic crisis or storm: Secondary | ICD-10-CM | POA: Diagnosis not present

## 2018-06-28 DIAGNOSIS — I5030 Unspecified diastolic (congestive) heart failure: Secondary | ICD-10-CM | POA: Diagnosis not present

## 2018-06-28 DIAGNOSIS — I110 Hypertensive heart disease with heart failure: Secondary | ICD-10-CM | POA: Diagnosis not present

## 2018-06-28 DIAGNOSIS — N189 Chronic kidney disease, unspecified: Secondary | ICD-10-CM | POA: Diagnosis not present

## 2018-06-28 DIAGNOSIS — N179 Acute kidney failure, unspecified: Secondary | ICD-10-CM | POA: Diagnosis not present

## 2018-06-28 DIAGNOSIS — D649 Anemia, unspecified: Secondary | ICD-10-CM | POA: Diagnosis not present

## 2018-06-28 DIAGNOSIS — I10 Essential (primary) hypertension: Secondary | ICD-10-CM | POA: Diagnosis not present

## 2018-06-28 DIAGNOSIS — N200 Calculus of kidney: Secondary | ICD-10-CM | POA: Diagnosis not present

## 2018-06-28 DIAGNOSIS — E119 Type 2 diabetes mellitus without complications: Secondary | ICD-10-CM | POA: Diagnosis not present

## 2018-06-28 DIAGNOSIS — E1165 Type 2 diabetes mellitus with hyperglycemia: Secondary | ICD-10-CM | POA: Diagnosis not present

## 2018-06-28 DIAGNOSIS — N3090 Cystitis, unspecified without hematuria: Secondary | ICD-10-CM | POA: Diagnosis not present

## 2018-06-28 DIAGNOSIS — R69 Illness, unspecified: Secondary | ICD-10-CM | POA: Diagnosis not present

## 2018-06-28 DIAGNOSIS — E440 Moderate protein-calorie malnutrition: Secondary | ICD-10-CM | POA: Diagnosis not present

## 2018-06-29 DIAGNOSIS — E872 Acidosis: Secondary | ICD-10-CM | POA: Diagnosis not present

## 2018-06-29 DIAGNOSIS — N184 Chronic kidney disease, stage 4 (severe): Secondary | ICD-10-CM | POA: Diagnosis not present

## 2018-06-29 DIAGNOSIS — N179 Acute kidney failure, unspecified: Secondary | ICD-10-CM | POA: Diagnosis not present

## 2018-06-29 DIAGNOSIS — I130 Hypertensive heart and chronic kidney disease with heart failure and stage 1 through stage 4 chronic kidney disease, or unspecified chronic kidney disease: Secondary | ICD-10-CM | POA: Diagnosis not present

## 2018-06-29 DIAGNOSIS — N189 Chronic kidney disease, unspecified: Secondary | ICD-10-CM | POA: Diagnosis not present

## 2018-06-29 DIAGNOSIS — K7581 Nonalcoholic steatohepatitis (NASH): Secondary | ICD-10-CM | POA: Diagnosis not present

## 2018-06-29 DIAGNOSIS — D649 Anemia, unspecified: Secondary | ICD-10-CM | POA: Diagnosis not present

## 2018-06-29 DIAGNOSIS — B964 Proteus (mirabilis) (morganii) as the cause of diseases classified elsewhere: Secondary | ICD-10-CM | POA: Diagnosis not present

## 2018-06-29 DIAGNOSIS — E1122 Type 2 diabetes mellitus with diabetic chronic kidney disease: Secondary | ICD-10-CM | POA: Diagnosis not present

## 2018-06-29 DIAGNOSIS — K7469 Other cirrhosis of liver: Secondary | ICD-10-CM | POA: Diagnosis not present

## 2018-06-29 DIAGNOSIS — N200 Calculus of kidney: Secondary | ICD-10-CM | POA: Diagnosis not present

## 2018-06-29 DIAGNOSIS — I5030 Unspecified diastolic (congestive) heart failure: Secondary | ICD-10-CM | POA: Diagnosis not present

## 2018-06-29 DIAGNOSIS — Z87442 Personal history of urinary calculi: Secondary | ICD-10-CM | POA: Diagnosis not present

## 2018-06-29 DIAGNOSIS — Z85528 Personal history of other malignant neoplasm of kidney: Secondary | ICD-10-CM | POA: Diagnosis not present

## 2018-06-29 DIAGNOSIS — E0500 Thyrotoxicosis with diffuse goiter without thyrotoxic crisis or storm: Secondary | ICD-10-CM | POA: Diagnosis not present

## 2018-06-29 DIAGNOSIS — N3090 Cystitis, unspecified without hematuria: Secondary | ICD-10-CM | POA: Diagnosis not present

## 2018-06-29 DIAGNOSIS — Z7984 Long term (current) use of oral hypoglycemic drugs: Secondary | ICD-10-CM | POA: Diagnosis not present

## 2018-06-30 DIAGNOSIS — N200 Calculus of kidney: Secondary | ICD-10-CM | POA: Diagnosis not present

## 2018-06-30 DIAGNOSIS — N184 Chronic kidney disease, stage 4 (severe): Secondary | ICD-10-CM | POA: Diagnosis not present

## 2018-06-30 DIAGNOSIS — D649 Anemia, unspecified: Secondary | ICD-10-CM | POA: Diagnosis not present

## 2018-06-30 DIAGNOSIS — B964 Proteus (mirabilis) (morganii) as the cause of diseases classified elsewhere: Secondary | ICD-10-CM | POA: Diagnosis not present

## 2018-06-30 DIAGNOSIS — R9431 Abnormal electrocardiogram [ECG] [EKG]: Secondary | ICD-10-CM | POA: Diagnosis not present

## 2018-06-30 DIAGNOSIS — N179 Acute kidney failure, unspecified: Secondary | ICD-10-CM | POA: Diagnosis not present

## 2018-06-30 DIAGNOSIS — N390 Urinary tract infection, site not specified: Secondary | ICD-10-CM | POA: Diagnosis not present

## 2018-07-05 ENCOUNTER — Ambulatory Visit: Payer: Medicare HMO | Admitting: "Endocrinology

## 2018-07-05 ENCOUNTER — Encounter: Payer: Self-pay | Admitting: "Endocrinology

## 2018-07-05 VITALS — BP 161/75 | HR 66 | Ht 62.0 in | Wt 114.0 lb

## 2018-07-05 DIAGNOSIS — Z6820 Body mass index (BMI) 20.0-20.9, adult: Secondary | ICD-10-CM | POA: Diagnosis not present

## 2018-07-05 DIAGNOSIS — E0590 Thyrotoxicosis, unspecified without thyrotoxic crisis or storm: Secondary | ICD-10-CM | POA: Diagnosis not present

## 2018-07-05 DIAGNOSIS — D649 Anemia, unspecified: Secondary | ICD-10-CM | POA: Diagnosis not present

## 2018-07-05 DIAGNOSIS — E0500 Thyrotoxicosis with diffuse goiter without thyrotoxic crisis or storm: Secondary | ICD-10-CM | POA: Diagnosis not present

## 2018-07-05 DIAGNOSIS — N179 Acute kidney failure, unspecified: Secondary | ICD-10-CM | POA: Diagnosis not present

## 2018-07-05 NOTE — Progress Notes (Signed)
Endocrinology Consult Note    Subjective:    Patient ID: [name], female    DOB: 28-Aug-1941, PCP Kern Alberta (Inactive).   Past Medical History:  Diagnosis Date  . 10 year risk of MI or stroke 7.5% or greater   . Bursitis   . Diabetes mellitus ([place])   . FHx: migraine headaches   . GERD (gastroesophageal reflux disease)   . Hypercholesterolemia   . Hypertension   . Renal lithiasis   . Takotsubo cardiomyopathy 06/2013   Past Surgical History:  Procedure Laterality Date  . CHOLECYSTECTOMY    . COLONOSCOPY  06/15/2005   RMR: Minimal internal hemorrhoids. Otherwise normal rectum/Normal colon  . COLONOSCOPY    12/14/2001   RMR: Internal hemorrhage, otherwise, normal rectum/ Polyps at 30 and 35 cm resected; the remainder of colonic mucosa appeared normal  . COLONOSCOPY  05/2009   RMR: minimal hemorrhoids, sigmoid hyperplastic polyp  . COLONOSCOPY N/A 10/24/2013   [id number] colonic polyps removed as described above/Colonic diverticulosis  . ESOPHAGOGASTRODUODENOSCOPY  06/12/2007   XHB:[id number] esophagus/patulous EG junction, moderate sized  hiatal hernia, otherwise normal stomach, D1, D2  . ESOPHAGOGASTRODUODENOSCOPY N/A 10/24/2013   RMR: Small hiatal hernia; otherwise, normal EGD  . HIP SURGERY     right  . LEFT HEART CATHETERIZATION WITH CORONARY ANGIOGRAM N/A 07/02/2013   Procedure: LEFT HEART CATHETERIZATION WITH CORONARY ANGIOGRAM;  Surgeon: Josue Hector, MD;  Location: [facility] CATH LAB;  Service: Cardiovascular;  Laterality: N/A;  . PARTIAL HYSTERECTOMY     Social History   Socioeconomic History  . Marital status: Divorced    Spouse name: Not on file  . Number of children: 3  . Years of education: Not on file  . Highest education level: Not on file  Occupational History  . 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 Financial resource strain: Not on file  . Food insecurity:    Worry: Not on file    Inability: Not on  file  . Transportation needs:    Medical: Not on file    Non-medical: Not on file  Tobacco Use  . Smoking status: Former Smoker    Packs/day: 1.00    Years: 43.00    Pack years: 43.00    Types: Cigarettes    Start date: 08/31/1959    Last attempt to quit: 08/30/2002    Years since quitting: 15.8  . Smokeless tobacco: Never Used  . Tobacco comment: Quit x 10 years  Substance and Sexual Activity  . Alcohol use: No  . Drug use: No  . Sexual activity: Not on file  Lifestyle  . Physical activity:    Days per week: Not on file    Minutes per session: Not on file  . Stress: Not on file  Relationships  . Social connections:    Talks on phone: Not on file    Gets together: Not on file    Attends religious service: Not on file    Active member of club or organization: Not on file    Attends meetings of clubs or organizations: Not on file    Relationship status: Not on file  Other Topics Concern  . Not on file  Social History Narrative  . Not on file   Outpatient Encounter Medications as of 07/05/2018  Medication Sig  . buPROPion (WELLBUTRIN SR) 150 MG 12 hr tablet Take 150 mg by mouth daily.  . carvedilol (COREG) 6.25 MG tablet TAKE ONE TABLET  TWICE DAILY WITH A MEAL  . cholecalciferol (VITAMIN D) 1000 UNITS tablet Take 2,000 Units by mouth daily.  Marland Kitchen epoetin alfa (EPOGEN,PROCRIT) 4000 UNIT/ML injection INJECT 1ML (4000 UNITS) INTO THE SKIN THREE TIMES A WEEK  . FLUoxetine (PROZAC) 40 MG capsule Take 40 mg by mouth daily.  . furosemide (LASIX) 20 MG tablet TAKE ONE (1) TABLET EACH DAY  . glipiZIDE (GLUCOTROL) 5 MG tablet Take 5 mg by mouth.  . Multiple Vitamins-Minerals (PRESERVISION AREDS PO) Take by mouth.  Marland Kitchen omeprazole (PRILOSEC) 20 MG capsule Take 20 mg by mouth daily.   . ondansetron (ZOFRAN ODT) 4 MG disintegrating tablet Take 1 tablet (4 mg total) by mouth every 8 (eight) hours as needed for nausea.  . ondansetron (ZOFRAN) 4 MG tablet Take 4 mg by mouth every 8 (eight) hours  as needed. One tab q 8 h prn for nausea, vomiting  . Probiotic Product (PHILLIPS COLON HEALTH PO) Take 1 tablet by mouth daily.  Marland Kitchen sulfamethoxazole-trimethoprim (BACTRIM,SEPTRA) 400-80 MG per tablet Take 1 tablet by mouth daily.  . traMADol (ULTRAM) 50 MG tablet Take 50 mg by mouth every 6 (six) hours as needed.  . traZODone (DESYREL) 50 MG tablet Take 50 mg by mouth at bedtime.   . Zinc 50 MG TABS Take by mouth as directed.   No facility-administered encounter medications on file as of 07/05/2018.     ALLERGIES: Allergies  Allergen Reactions  . Codeine Other (See Comments)    Reaction unknown     VACCINATION STATUS:  There is no immunization history on file for this patient.   HPI  [name] is 77 y.o. female who presents today with a medical history as above. she is being seen in consultation for hyperthyroidism requested by Kern Alberta (Inactive).  she has been dealing with symptoms of unintentional weight loss, fatigue, and tremors. -She underwent thyroid function test which showed suppressed TSH of 0.22 associated with normal total T4 of 7 on May 08, 2018.  She subsequently underwent thyroid uptake and scan on May 16, 2018 which showed increased uptake of 47% with no focal abnormality consistent with Graves' disease. -She has lost 15 pounds over the course of 31-month, before she regained 10 pounds due to abdominal ascites.  she denies dysphagia, choking, shortness of breath, no recent voice change.    she reports family history of thyroid dysfunction in her sister who has hypothyroidism, but denies family hx of thyroid cancer. she denies personal history of goiter. she is not on any anti-thyroid medications nor on any thyroid hormone supplements. she  is willing to proceed with appropriate work up and therapy for thyrotoxicosis. -She is a poor historian, hard of hearing, accompanied by her neighbor who is offering to help.                           Review  of systems  Constitutional: + weight loss, + fatigue, + subjective hyperthermia Eyes: no blurry vision, - xerophthalmia ENT: no sore throat, no nodules palpated in throat, no dysphagia/odynophagia, nor hoarseness Cardiovascular: no Chest Pain, no Shortness of Breath, -  palpitations, no leg swelling Respiratory: no cough, no SOB Gastrointestinal: no Nausea, no Vomiting, no Diarhhea Musculoskeletal: no muscle/joint aches Skin: no rashes Neurological: +  tremors, no numbness, no tingling, no dizziness Psychiatric: no depression, ++  anxiety   Objective:    BP (!) 161/75   Pulse 66   Ht 5\' 2"  (1.575 m)  Wt 114 lb (51.7 kg)   BMI 20.85 kg/m   Wt Readings from Last 3 Encounters:  07/05/18 114 lb (51.7 kg)  01/21/18 110 lb (49.9 kg)  08/31/17 110 lb 6.4 oz (50.1 kg)                                                Physical exam  Constitutional: ++ weight for height, not in acute distress, + weekly sick looking, however has normal state of mind.   Eyes: PERRLA, EOMI, - exophthalmos ENT: moist mucous membranes, -  thyromegaly, no cervical lymphadenopathy Cardiovascular: + normal precordial activity, + normal rate and Rhythm, no Murmur/Rubs/Gallops Respiratory:  adequate breathing efforts, no gross chest deformity, Clear to auscultation bilaterally Gastrointestinal: abdomen soft, Non -tender, No distension, Bowel Sounds present Musculoskeletal: no gross deformities, strength intact in all four extremities Skin: moist, warm, no rashes Neurological: +  tremor with outstretched hands,  + Deep Tendon Reflexes  on both lower extremities.   CMP     Component Value Date/Time   NA 131 (L) 01/21/2018 1359   K 4.7 01/21/2018 1359   CL 101 01/21/2018 1359   CO2 21 (L) 01/21/2018 1359   GLUCOSE 178 (H) 01/21/2018 1359   BUN 28 (H) 01/21/2018 1359   BUN 24 (A) 05/24/2013   CREATININE 2.28 (H) 01/21/2018 1359   CREATININE 1.60 (H) 07/12/2013 1530   CALCIUM 8.8 (L) 01/21/2018 1359   PROT  6.7 01/21/2018 1359   ALBUMIN 3.2 (L) 01/21/2018 1359   ALBUMIN 4.2 05/24/2013   AST 36 01/21/2018 1359   AST 36 05/24/2013   ALT 26 01/21/2018 1359   ALKPHOS 137 (H) 01/21/2018 1359   ALKPHOS 67 05/24/2013   BILITOT 1.0 01/21/2018 1359   BILITOT 0.2 05/24/2013   GFRNONAA 20 (L) 01/21/2018 1359   GFRAA 23 (L) 01/21/2018 1359     CBC    Component Value Date/Time   WBC 6.3 01/21/2018 1359   RBC 4.10 01/21/2018 1359   HGB 10.6 (L) 01/21/2018 1359   HGB 8.6 05/24/2013   HCT 33.7 (L) 01/21/2018 1359   HCT 28 05/24/2013   PLT 89 (L) 01/21/2018 1359   MCV 82.2 01/21/2018 1359   MCH 25.9 (L) 01/21/2018 1359   MCHC 31.5 01/21/2018 1359   RDW 15.5 01/21/2018 1359   LYMPHSABS 0.7 01/21/2018 1359   MONOABS 0.7 01/21/2018 1359   EOSABS 0.0 01/21/2018 1359   BASOSABS 0.0 01/21/2018 1359       Lab Results  Component Value Date   TSH 1.31 03/13/2014    May 08, 2018 TSH 0.229, total T4 7 Thyroid uptake and scan on May 17, 2019 1947% uniform uptake with no focal abnormalities consistent with Graves' disease.   Assessment & Plan:   1. Hyperthyroidism she is being seen at a kind request of Kern Alberta (Inactive). her history and most recent labs are reviewed, and she was examined clinically. Subjective and objective findings are consistent with thyrotoxicosis likely from primary hyperthyroidism. The potential risks of untreated thyrotoxicosis and the need for definitive therapy have been discussed in detail with her, and she agrees to proceed with diagnostic workup and treatment plan.   I like to repeat full profile thyroid function tests today which will be compared with her recent thyroid uptake and scan during her next visit in 2 weeks.   -If  she is found to have primary hyperthyroidism, she will be considered for radioactive iodine thyroid ablation.  Treatment was briefly discussed with her and he agrees to proceed.  -Understands  the fact that there is a risk of  post ablative hypothyroidism with subsequent need for lifelong thyroid hormone replacement. she is made aware of this outcome  and she is  willing to proceed.  -I did not initiate any new prescriptions for her today. - I advised her to maintain close follow up with Kern Alberta (Inactive) for primary care needs.   - Time spent with the patient: 30 minutes, of which >50% was spent in obtaining information about her symptoms, reviewing her previous labs, evaluations, and treatments, counseling her about her hyperthyroidism, and developing a plan to confirm the diagnosis and long term treatment as necessary.  [name] participated in the discussions, expressed understanding, and voiced agreement with the above plans.  All questions were answered to her satisfaction. she is encouraged to contact clinic should she have any questions or concerns prior to her return visit.  Follow up plan: Return in about 2 weeks (around 07/19/2018) for Labs Today- Non-Fasting Ok.   Thank you for involving me in the care of this pleasant patient, and I will continue to update you with her progress.  Glade Lloyd, MD [facility] Phone: [phone]  Fax: [phone]   07/05/2018, 1:30 PM  This note was partially dictated with voice recognition software. Similar sounding words can be transcribed inadequately or may not  be corrected upon review.

## 2018-07-06 LAB — THYROGLOBULIN ANTIBODY: Thyroglobulin Ab: <1 IU/mL (ref <=1)

## 2018-07-06 LAB — TSH: TSH: 2.35 m[IU]/L (ref 0.40–4.50)

## 2018-07-06 LAB — THYROID PEROXIDASE ANTIBODY: THYROID PEROXIDASE ANTIBODY: 7 [IU]/mL (ref <9)

## 2018-07-06 LAB — T4, FREE: Free T4: 1.2 ng/dL (ref 0.8–1.8)

## 2018-07-06 LAB — T3, FREE: T3 FREE: 2.5 pg/mL (ref 2.3–4.2)

## 2018-07-12 DIAGNOSIS — N200 Calculus of kidney: Secondary | ICD-10-CM | POA: Diagnosis not present

## 2018-07-17 DIAGNOSIS — N200 Calculus of kidney: Secondary | ICD-10-CM | POA: Diagnosis not present

## 2018-07-17 DIAGNOSIS — E119 Type 2 diabetes mellitus without complications: Secondary | ICD-10-CM | POA: Diagnosis not present

## 2018-07-25 ENCOUNTER — Encounter: Payer: Self-pay | Admitting: "Endocrinology

## 2018-07-25 ENCOUNTER — Ambulatory Visit: Payer: Medicare HMO | Admitting: "Endocrinology

## 2018-07-25 VITALS — BP 125/79 | HR 74 | Ht 62.0 in | Wt 110.0 lb

## 2018-07-25 DIAGNOSIS — E1165 Type 2 diabetes mellitus with hyperglycemia: Secondary | ICD-10-CM | POA: Insufficient documentation

## 2018-07-25 MED ORDER — INSULIN GLARGINE 100 UNIT/ML SOLOSTAR PEN: 14.0000 [IU] | PEN_INJECTOR | Freq: Every day | SUBCUTANEOUS | 2 refills | Status: DC

## 2018-07-25 MED ORDER — INSULIN PEN NEEDLE 32G X 4 MM MISC: 1.0000 | Freq: Four times a day (QID) | 2 refills | Status: DC

## 2018-07-25 NOTE — Progress Notes (Signed)
Endocrinology follow-up  Note    Subjective:    Patient ID: [name], female    DOB: 1940/11/23, PCP Kern Alberta (Inactive).   Past Medical History:  Diagnosis Date  . 10 year risk of MI or stroke 7.5% or greater   . Bursitis   . Diabetes mellitus ([place])   . FHx: migraine headaches   . GERD (gastroesophageal reflux disease)   . Hypercholesterolemia   . Hypertension   . Renal lithiasis   . Takotsubo cardiomyopathy 06/2013   Past Surgical History:  Procedure Laterality Date  . CHOLECYSTECTOMY    . COLONOSCOPY  06/15/2005   RMR: Minimal internal hemorrhoids. Otherwise normal rectum/Normal colon  . COLONOSCOPY    12/14/2001   RMR: Internal hemorrhage, otherwise, normal rectum/ Polyps at 30 and 35 cm resected; the remainder of colonic mucosa appeared normal  . COLONOSCOPY  05/2009   RMR: minimal hemorrhoids, sigmoid hyperplastic polyp  . COLONOSCOPY N/A 10/24/2013   HCW:CBJSEGBT colonic polyps removed as described above/Colonic diverticulosis  . ESOPHAGOGASTRODUODENOSCOPY  06/12/2007   DVV:OHYWVP esophagus/patulous EG junction, moderate sized  hiatal hernia, otherwise normal stomach, D1, D2  . ESOPHAGOGASTRODUODENOSCOPY N/A 10/24/2013   RMR: Small hiatal hernia; otherwise, normal EGD  . HIP SURGERY     right  . LEFT HEART CATHETERIZATION WITH CORONARY ANGIOGRAM N/A 07/02/2013   Procedure: LEFT HEART CATHETERIZATION WITH CORONARY ANGIOGRAM;  Surgeon: Josue Hector, MD;  Location: [facility] CATH LAB;  Service: Cardiovascular;  Laterality: N/A;  . PARTIAL HYSTERECTOMY     Social History   Socioeconomic History  . Marital status: Divorced    Spouse name: Not on file  . Number of children: 3  . Years of education: Not on file  . Highest education level: Not on file  Occupational History  . 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 Financial resource strain: Not on file  . Food insecurity:    Worry: Not on file    Inability: Not on  file  . Transportation needs:    Medical: Not on file    Non-medical: Not on file  Tobacco Use  . Smoking status: Former Smoker    Packs/day: 1.00    Years: 43.00    Pack years: 43.00    Types: Cigarettes    Start date: 08/31/1959    Last attempt to quit: 08/30/2002    Years since quitting: 15.9  . Smokeless tobacco: Never Used  . Tobacco comment: Quit x 10 years  Substance and Sexual Activity  . Alcohol use: No  . Drug use: No  . Sexual activity: Not on file  Lifestyle  . Physical activity:    Days per week: Not on file    Minutes per session: Not on file  . Stress: Not on file  Relationships  . Social connections:    Talks on phone: Not on file    Gets together: Not on file    Attends religious service: Not on file    Active member of club or organization: Not on file    Attends meetings of clubs or organizations: Not on file    Relationship status: Not on file  Other Topics Concern  . Not on file  Social History Narrative  . Not on file   Outpatient Encounter Medications as of 07/25/2018  Medication Sig  . buPROPion (WELLBUTRIN SR) 150 MG 12 hr tablet Take 150 mg by mouth daily.  . carvedilol (COREG) 6.25 MG tablet TAKE ONE  TABLET TWICE DAILY WITH A MEAL  . cholecalciferol (VITAMIN D) 1000 UNITS tablet Take 2,000 Units by mouth daily.  Marland Kitchen epoetin alfa (EPOGEN,PROCRIT) 4000 UNIT/ML injection INJECT 1ML (4000 UNITS) INTO THE SKIN THREE TIMES A WEEK  . FLUoxetine (PROZAC) 40 MG capsule Take 40 mg by mouth daily.  . furosemide (LASIX) 20 MG tablet TAKE ONE (1) TABLET EACH DAY  . Insulin Glargine (LANTUS SOLOSTAR) 100 UNIT/ML Solostar Pen Inject 14 Units into the skin daily with breakfast.  . Insulin Pen Needle (BD PEN NEEDLE NANO U/F) 32G X 4 MM MISC 1 each by Does not apply route 4 (four) times daily.  . Multiple Vitamins-Minerals (PRESERVISION AREDS PO) Take by mouth.  Marland Kitchen omeprazole (PRILOSEC) 20 MG capsule Take 20 mg by mouth daily.   . ondansetron (ZOFRAN ODT) 4 MG  disintegrating tablet Take 1 tablet (4 mg total) by mouth every 8 (eight) hours as needed for nausea.  . ondansetron (ZOFRAN) 4 MG tablet Take 4 mg by mouth every 8 (eight) hours as needed. One tab q 8 h prn for nausea, vomiting  . Probiotic Product (PHILLIPS COLON HEALTH PO) Take 1 tablet by mouth daily.  Marland Kitchen sulfamethoxazole-trimethoprim (BACTRIM,SEPTRA) 400-80 MG per tablet Take 1 tablet by mouth daily.  . traMADol (ULTRAM) 50 MG tablet Take 50 mg by mouth every 6 (six) hours as needed.  . traZODone (DESYREL) 50 MG tablet Take 50 mg by mouth at bedtime.   . Zinc 50 MG TABS Take by mouth as directed.  . [DISCONTINUED] glipiZIDE (GLUCOTROL) 5 MG tablet Take 5 mg by mouth.   No facility-administered encounter medications on file as of 07/25/2018.     ALLERGIES: Allergies  Allergen Reactions  . Codeine Other (See Comments)    Reaction unknown     VACCINATION STATUS:  There is no immunization history on file for this patient.   HPI  [name] is 77 y.o. female who presents today with a medical history as above. she is being seen in follow-up after she was seen in consultation for hyperthyroidism.  Her previsit full profile thyroid function tests are within normal limits.  She has no new complaints.   -Prior to her last visit she underwent thyroid uptake and scan on May 16, 2018 which showed increased uptake of 47% with no focal abnormality.     she denies dysphagia, choking, shortness of breath, no recent voice change.    she reports family history of thyroid dysfunction in her sister who has hypothyroidism, but denies family hx of thyroid cancer. she denies personal history of goiter. she is not on any anti-thyroid medications nor on any thyroid hormone supplements.   -She is hard of hearing, a poor historian, accompanied by her grandson.  She brought with her her blood glucose readings for the last 15 days which are significantly above target, average blood glucose  is 230 mg/dL.  A1c on May 08, 2018 was 5.8% however this patient has severe anemia with hemoglobin of 7 mg/dL and hematocrit of 23.6. -She is not currently on  medications for diabetes.                          Review of systems  Constitutional: + Fluctuating body weight, + fatigue, + subjective hyperthermia Eyes: no blurry vision, - xerophthalmia ENT: no sore throat, no nodules palpated in throat, no dysphagia/odynophagia, nor hoarseness Cardiovascular: no Chest Pain, no Shortness of Breath, -  palpitations, no leg swelling  Musculoskeletal: no muscle/joint aches Skin: no rashes Neurological: +  tremors, no numbness, no tingling, no dizziness Psychiatric: no depression, ++  anxiety   Objective:    BP 125/79   Pulse 74   Ht 5\' 2"  (1.575 m)   Wt 110 lb (49.9 kg)   BMI 20.12 kg/m   Wt Readings from Last 3 Encounters:  07/25/18 110 lb (49.9 kg)  07/05/18 114 lb (51.7 kg)  01/21/18 110 lb (49.9 kg)                                                Physical exam  Constitutional: + light weight, no acute distress, + chronically sick looking, however has normal state of mind.   Eyes: PERRLA, EOMI, - exophthalmos ENT: moist mucous membranes, -  thyromegaly, no cervical lymphadenopathy Cardiovascular: + normal precordial activity, + normal rate and Rhythm, no Murmur/Rubs/Gallops Respiratory:  adequate breathing efforts, no gross chest deformity, Clear to auscultation bilaterally Gastrointestinal: abdomen soft, Non -tender, No distension, Bowel Sounds present Musculoskeletal: no gross deformities, strength intact in all four extremities Skin: moist, warm, no rashes Neurological: +  tremor with outstretched hands,     CMP     Component Value Date/Time   NA 131 (L) 01/21/2018 1359   K 4.7 01/21/2018 1359   CL 101 01/21/2018 1359   CO2 21 (L) 01/21/2018 1359   GLUCOSE 178 (H) 01/21/2018 1359   BUN 28 (H) 01/21/2018 1359   BUN 24 (A) 05/24/2013   CREATININE 2.28 (H) 01/21/2018  1359   CREATININE 1.60 (H) 07/12/2013 1530   CALCIUM 8.8 (L) 01/21/2018 1359   PROT 6.7 01/21/2018 1359   ALBUMIN 3.2 (L) 01/21/2018 1359   ALBUMIN 4.2 05/24/2013   AST 36 01/21/2018 1359   AST 36 05/24/2013   ALT 26 01/21/2018 1359   ALKPHOS 137 (H) 01/21/2018 1359   ALKPHOS 67 05/24/2013   BILITOT 1.0 01/21/2018 1359   BILITOT 0.2 05/24/2013   GFRNONAA 20 (L) 01/21/2018 1359   GFRAA 23 (L) 01/21/2018 1359     CBC    Component Value Date/Time   WBC 6.3 01/21/2018 1359   RBC 4.10 01/21/2018 1359   HGB 10.6 (L) 01/21/2018 1359   HGB 8.6 05/24/2013   HCT 33.7 (L) 01/21/2018 1359   HCT 28 05/24/2013   PLT 89 (L) 01/21/2018 1359   MCV 82.2 01/21/2018 1359   MCH 25.9 (L) 01/21/2018 1359   MCHC 31.5 01/21/2018 1359   RDW 15.5 01/21/2018 1359   LYMPHSABS 0.7 01/21/2018 1359   MONOABS 0.7 01/21/2018 1359   EOSABS 0.0 01/21/2018 1359   BASOSABS 0.0 01/21/2018 1359       Lab Results  Component Value Date   TSH 2.35 07/05/2018   TSH 1.31 03/13/2014   FREET4 1.2 07/05/2018    May 08, 2018 TSH 0.229, total T4 7 Thyroid uptake and scan on May 17, 2019 1947% uniform uptake with no focal abnormalities consistent with Graves' disease.   Assessment & Plan:   1. Hyperthyroidism -Her previsit thyroid function tests are within normal limits consistent with euthyroid presentation.  This is despite her thyroid uptake and scan which showed 47% uptake with no focal abnormality before her last visit.  She would not require antithyroid intervention at this time, will need repeat thyroid function test in 3 months.  2.  Type 2  diabetes-uncontrolled -Her blood glucose logs for the last 14 days shows average of 230 mg/dL.  Her recent A1c is 5.8%, likely undermined by concurrent severe anemia with hemoglobin of 7 g/dL hematocrit of 23.6.  Unknown.  She will be considered for full profile diabetes work-up after her next visit. -He has complications including renal insufficiency,  and cardiomyopathy.  Is not a candidate for incretin therapy, metformin.  She would benefit from initiation of insulin treatment. -I discussed and approached her for insulin treatment and her grandson is offering to help, and will discuss it with her daughter who is his mother.  -I demonstrated insulin use in the exam room and initiated Lantus 14 units daily with breakfast associated with monitoring of blood glucose 4 times daily-before breakfast, lunch, supper, and bedtime and return in 2 weeks for reevaluation.  -  Suggestion is made for her to avoid simple carbohydrates  from her diet including Cakes, Sweet Desserts / Pastries, Ice Cream, Soda (diet and regular), Sweet Tea, Candies, Chips, Cookies, Store Bought Juices, Alcohol in Excess of  1-2 drinks a day, Artificial Sweeteners, and "Sugar-free" Products.  - I advised her to maintain close follow up with Kern Alberta (Inactive) for primary care needs.  Follow up plan: Return in about 2 weeks (around 08/08/2018) for Follow up with Meter and Logs Only - no Labs.   Thank you for involving me in the care of this pleasant patient, and I will continue to update you with her progress.  Glade Lloyd, MD [facility] Phone: [phone]  Fax: [phone]   07/25/2018, 5:39 PM  This note was partially dictated with voice recognition software. Similar sounding words can be transcribed inadequately or may not  be corrected upon review.

## 2018-07-26 ENCOUNTER — Other Ambulatory Visit: Payer: Self-pay

## 2018-07-26 MED ORDER — INSULIN DEGLUDEC 100 UNIT/ML ~~LOC~~ SOPN: 14.0000 [IU] | PEN_INJECTOR | Freq: Every day | SUBCUTANEOUS | 2 refills | Status: DC

## 2018-07-29 DIAGNOSIS — C641 Malignant neoplasm of right kidney, except renal pelvis: Secondary | ICD-10-CM | POA: Diagnosis not present

## 2018-07-29 DIAGNOSIS — E1122 Type 2 diabetes mellitus with diabetic chronic kidney disease: Secondary | ICD-10-CM | POA: Diagnosis not present

## 2018-07-29 DIAGNOSIS — E782 Mixed hyperlipidemia: Secondary | ICD-10-CM | POA: Diagnosis not present

## 2018-07-30 DIAGNOSIS — E0500 Thyrotoxicosis with diffuse goiter without thyrotoxic crisis or storm: Secondary | ICD-10-CM | POA: Diagnosis not present

## 2018-07-30 DIAGNOSIS — N179 Acute kidney failure, unspecified: Secondary | ICD-10-CM | POA: Diagnosis not present

## 2018-07-30 DIAGNOSIS — D649 Anemia, unspecified: Secondary | ICD-10-CM | POA: Diagnosis not present

## 2018-07-31 ENCOUNTER — Telehealth: Payer: Self-pay | Admitting: "Endocrinology

## 2018-07-31 NOTE — Telephone Encounter (Signed)
Labs have been faxed over to Dr. Olena Heckle' office with recommendation

## 2018-07-31 NOTE — Telephone Encounter (Signed)
   These are the patient's most recent thyroid function test results, showing normal findings.  She does not have hyperthyroidism at this time, and does not need treatment for hyperthyroidism.   Results for [name], [name] (MRN [id number]) as of 07/31/2018 10:55  Ref. Range 07/05/2018 10:20  TSH Latest Ref Range: 0.40 - 4.50 mIU/L 2.35  Triiodothyronine,Free,Serum Latest Ref Range: 2.3 - 4.2 pg/mL 2.5  T4,Free(Direct) Latest Ref Range: 0.8 - 1.8 ng/dL 1.2  Thyroglobulin Ab Latest Ref Range: < or = 1 IU/mL <1  Thyroperoxidase Ab SerPl-aCnc Latest Ref Range: <9 IU/mL 7   Adding methimazole without a diagnosis of hyperthyroidism is putting the patient at risk of unnecessary medication side effects. If Dr. Quillian Quince does not have access to her latest labs, please fax a copy for his review.

## 2018-07-31 NOTE — Telephone Encounter (Signed)
[facility] called and left a voicemail for Dr. Dorris Fetch to inform him that Dr. Quillian Quince has Ms Mceachron on [facility] 5 mg 1 daily and he wanted to let Dr.Nida know that could be effecting her labs

## 2018-08-07 DIAGNOSIS — N2889 Other specified disorders of kidney and ureter: Secondary | ICD-10-CM | POA: Diagnosis not present

## 2018-08-07 DIAGNOSIS — N200 Calculus of kidney: Secondary | ICD-10-CM | POA: Diagnosis not present

## 2018-08-07 DIAGNOSIS — N281 Cyst of kidney, acquired: Secondary | ICD-10-CM | POA: Diagnosis not present

## 2018-08-08 ENCOUNTER — Encounter: Payer: Self-pay | Admitting: "Endocrinology

## 2018-08-08 ENCOUNTER — Ambulatory Visit: Payer: Medicare HMO | Admitting: "Endocrinology

## 2018-08-08 VITALS — BP 132/73 | HR 74 | Ht 62.0 in | Wt 108.0 lb

## 2018-08-08 DIAGNOSIS — E1165 Type 2 diabetes mellitus with hyperglycemia: Secondary | ICD-10-CM

## 2018-08-08 MED ORDER — INSULIN DEGLUDEC 100 UNIT/ML ~~LOC~~ SOPN: 18.0000 [IU] | PEN_INJECTOR | Freq: Every day | SUBCUTANEOUS | 2 refills | Status: DC

## 2018-08-08 NOTE — Progress Notes (Signed)
Endocrinology follow-up  Note    Subjective:    Patient ID: [name], female    DOB: Dec 30, 1940, PCP Caryl Bis, MD.   Past Medical History:  Diagnosis Date  . 10 year risk of MI or stroke 7.5% or greater   . Bursitis   . Diabetes mellitus ([place])   . FHx: migraine headaches   . GERD (gastroesophageal reflux disease)   . Hypercholesterolemia   . Hypertension   . Renal lithiasis   . Takotsubo cardiomyopathy 06/2013   Past Surgical History:  Procedure Laterality Date  . CHOLECYSTECTOMY    . COLONOSCOPY  06/15/2005   RMR: Minimal internal hemorrhoids. Otherwise normal rectum/Normal colon  . COLONOSCOPY    12/14/2001   RMR: Internal hemorrhage, otherwise, normal rectum/ Polyps at 30 and 35 cm resected; the remainder of colonic mucosa appeared normal  . COLONOSCOPY  05/2009   RMR: minimal hemorrhoids, sigmoid hyperplastic polyp  . COLONOSCOPY N/A 10/24/2013   GQQ:[id number] colonic polyps removed as described above/Colonic diverticulosis  . ESOPHAGOGASTRODUODENOSCOPY  06/12/2007   OIZ:TIWPYK esophagus/patulous EG junction, moderate sized  hiatal hernia, otherwise normal stomach, D1, D2  . ESOPHAGOGASTRODUODENOSCOPY N/A 10/24/2013   RMR: Small hiatal hernia; otherwise, normal EGD  . HIP SURGERY     right  . LEFT HEART CATHETERIZATION WITH CORONARY ANGIOGRAM N/A 07/02/2013   Procedure: LEFT HEART CATHETERIZATION WITH CORONARY ANGIOGRAM;  Surgeon: Josue Hector, MD;  Location: [facility] CATH LAB;  Service: Cardiovascular;  Laterality: N/A;  . PARTIAL HYSTERECTOMY     Social History   Socioeconomic History  . Marital status: Divorced    Spouse name: Not on file  . Number of children: 3  . Years of education: Not on file  . Highest education level: Not on file  Occupational History  . 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 Financial resource strain: Not on file  . Food insecurity:    Worry: Not on file    Inability: Not on file   . Transportation needs:    Medical: Not on file    Non-medical: Not on file  Tobacco Use  . Smoking status: Former Smoker    Packs/day: 1.00    Years: 43.00    Pack years: 43.00    Types: Cigarettes    Start date: 08/31/1959    Last attempt to quit: 08/30/2002    Years since quitting: 15.9  . Smokeless tobacco: Never Used  . Tobacco comment: Quit x 10 years  Substance and Sexual Activity  . Alcohol use: No  . Drug use: No  . Sexual activity: Not on file  Lifestyle  . Physical activity:    Days per week: Not on file    Minutes per session: Not on file  . Stress: Not on file  Relationships  . Social connections:    Talks on phone: Not on file    Gets together: Not on file    Attends religious service: Not on file    Active member of club or organization: Not on file    Attends meetings of clubs or organizations: Not on file    Relationship status: Not on file  Other Topics Concern  . Not on file  Social History Narrative  . Not on file   Outpatient Encounter Medications as of 08/08/2018  Medication Sig  . [DISCONTINUED] methimazole (TAPAZOLE) 5 MG tablet Take 5 mg by mouth daily.  Marland Kitchen buPROPion (WELLBUTRIN SR) 150 MG 12  hr tablet Take 150 mg by mouth daily.  . carvedilol (COREG) 6.25 MG tablet TAKE ONE TABLET TWICE DAILY WITH A MEAL  . cholecalciferol (VITAMIN D) 1000 UNITS tablet Take 2,000 Units by mouth daily.  Marland Kitchen epoetin alfa (EPOGEN,PROCRIT) 4000 UNIT/ML injection INJECT 1ML (4000 UNITS) INTO THE SKIN THREE TIMES A WEEK  . FLUoxetine (PROZAC) 40 MG capsule Take 40 mg by mouth daily.  . furosemide (LASIX) 20 MG tablet TAKE ONE (1) TABLET EACH DAY  . insulin degludec (TRESIBA) 100 UNIT/ML SOPN FlexTouch Pen Inject 0.18 mLs (18 Units total) into the skin daily with breakfast.  . Insulin Pen Needle (BD PEN NEEDLE NANO U/F) 32G X 4 MM MISC 1 each by Does not apply route 4 (four) times daily.  . Multiple Vitamins-Minerals (PRESERVISION AREDS PO) Take by mouth.  Marland Kitchen omeprazole  (PRILOSEC) 20 MG capsule Take 20 mg by mouth daily.   . ondansetron (ZOFRAN ODT) 4 MG disintegrating tablet Take 1 tablet (4 mg total) by mouth every 8 (eight) hours as needed for nausea.  . ondansetron (ZOFRAN) 4 MG tablet Take 4 mg by mouth every 8 (eight) hours as needed. One tab q 8 h prn for nausea, vomiting  . Probiotic Product (PHILLIPS COLON HEALTH PO) Take 1 tablet by mouth daily.  Marland Kitchen sulfamethoxazole-trimethoprim (BACTRIM,SEPTRA) 400-80 MG per tablet Take 1 tablet by mouth daily.  . traMADol (ULTRAM) 50 MG tablet Take 50 mg by mouth every 6 (six) hours as needed.  . traZODone (DESYREL) 50 MG tablet Take 50 mg by mouth at bedtime.   . Zinc 50 MG TABS Take by mouth as directed.  . [DISCONTINUED] insulin degludec (TRESIBA) 100 UNIT/ML SOPN FlexTouch Pen Inject 0.14 mLs (14 Units total) into the skin daily.  . [DISCONTINUED] Insulin Glargine (LANTUS SOLOSTAR) 100 UNIT/ML Solostar Pen Inject 14 Units into the skin daily with breakfast.   No facility-administered encounter medications on file as of 08/08/2018.     ALLERGIES: Allergies  Allergen Reactions  . Codeine Other (See Comments)    Reaction unknown     VACCINATION STATUS:  There is no immunization history on file for this patient.   HPI  [name] is 77 y.o. female who presents today with a medical history as above. she is being seen in follow-up after she was initiated on low-dose basal insulin for uncontrolled glycemia during her last visit.    Thyroid function test prior to her last visit were all within normal limits, did not require any antithyroid intervention.  However in the interim, she was initiated on methimazole therapy by another provider for unclear reasons.  She has no new complaints.   -Prior to her last visit she underwent thyroid uptake and scan on May 16, 2018 which showed increased uptake of 47% with no focal abnormality.     she denies dysphagia, choking, shortness of breath, no  recent voice change.    she reports family history of thyroid dysfunction in her sister who has hypothyroidism, but denies family hx of thyroid cancer. she denies personal history of goiter. she is not on any anti-thyroid medications nor on any thyroid hormone supplements.   -She is hard of hearing, a poor historian, came today alone with her logs and meter to review.    -Her glycemic profile is such that her fasting readings are improving but slightly above target, and at bedtime readings are significantly above target.    A1c on May 08, 2018 was 5.8% however this patient has severe  anemia with hemoglobin of 7 mg/dL and hematocrit of 23.6.                          Review of systems  Constitutional: + Fluctuating body weight, + fatigue, + subjective hyperthermia Eyes: no blurry vision, - xerophthalmia ENT: no sore throat, no nodules palpated in throat, no dysphagia/odynophagia, nor hoarseness Cardiovascular: no Chest Pain, no Shortness of Breath, -  palpitations, no leg swelling Musculoskeletal: no muscle/joint aches Skin: no rashes Neurological: -  tremors, no numbness, no tingling, no dizziness Psychiatric: no depression, +  anxiety   Objective:    BP 132/73   Pulse 74   Ht 5\' 2"  (1.575 m)   Wt 108 lb (49 kg)   BMI 19.75 kg/m   Wt Readings from Last 3 Encounters:  08/08/18 108 lb (49 kg)  07/25/18 110 lb (49.9 kg)  07/05/18 114 lb (51.7 kg)                                                Physical exam  Constitutional: + light weight, no acute distress, + chronically sick looking, however has normal state of mind.   Eyes: PERRLA, EOMI, - exophthalmos ENT: moist mucous membranes, -  thyromegaly, no cervical lymphadenopathy Cardiovascular: + normal precordial activity, + normal rate and Rhythm, no Murmur/Rubs/Gallops Respiratory:  adequate breathing efforts, no gross chest deformity, Clear to auscultation bilaterally Gastrointestinal: abdomen soft, Non -tender, No  distension, Bowel Sounds present Musculoskeletal: no gross deformities, strength intact in all four extremities Skin: moist, warm, no rashes Neurological:   - tremor with outstretched hands    CMP     Component Value Date/Time   NA 131 (L) 01/21/2018 1359   K 4.7 01/21/2018 1359   CL 101 01/21/2018 1359   CO2 21 (L) 01/21/2018 1359   GLUCOSE 178 (H) 01/21/2018 1359   BUN 28 (H) 01/21/2018 1359   BUN 24 (A) 05/24/2013   CREATININE 2.28 (H) 01/21/2018 1359   CREATININE 1.60 (H) 07/12/2013 1530   CALCIUM 8.8 (L) 01/21/2018 1359   PROT 6.7 01/21/2018 1359   ALBUMIN 3.2 (L) 01/21/2018 1359   ALBUMIN 4.2 05/24/2013   AST 36 01/21/2018 1359   AST 36 05/24/2013   ALT 26 01/21/2018 1359   ALKPHOS 137 (H) 01/21/2018 1359   ALKPHOS 67 05/24/2013   BILITOT 1.0 01/21/2018 1359   BILITOT 0.2 05/24/2013   GFRNONAA 20 (L) 01/21/2018 1359   GFRAA 23 (L) 01/21/2018 1359     CBC    Component Value Date/Time   WBC 6.3 01/21/2018 1359   RBC 4.10 01/21/2018 1359   HGB 10.6 (L) 01/21/2018 1359   HGB 8.6 05/24/2013   HCT 33.7 (L) 01/21/2018 1359   HCT 28 05/24/2013   PLT 89 (L) 01/21/2018 1359   MCV 82.2 01/21/2018 1359   MCH 25.9 (L) 01/21/2018 1359   MCHC 31.5 01/21/2018 1359   RDW 15.5 01/21/2018 1359   LYMPHSABS 0.7 01/21/2018 1359   MONOABS 0.7 01/21/2018 1359   EOSABS 0.0 01/21/2018 1359   BASOSABS 0.0 01/21/2018 1359       Lab Results  Component Value Date   TSH 2.35 07/05/2018   TSH 1.31 03/13/2014   FREET4 1.2 07/05/2018    May 08, 2018 TSH 0.229, total T4 7 Thyroid uptake and scan  on May 16, 2018, 47% uniform uptake with no focal abnormalities consistent with Graves' disease.   Assessment & Plan:   1. Hyperthyroidism -Her recent thyroid function tests are within normal limits consistent with euthyroid presentation.  Although her prior thyroid uptake scan showed non-focal increased uptake at 47%, intervention with methimazole does not seem to be  appropriate at this time.  I have advised her to discontinue methimazole for now until repeat labs in 10 to 12 weeks.    2.  Type 2 diabetes-uncontrolled -Her blood glucose logs for the last 14 days shows improved glycemic profile.  Her recent A1c was 5.8%, likely undermined by her concurrent anemia with hemoglobin of 8.6,  hematocrit of 20.9.   -He has complications including renal insufficiency, and cardiomyopathy.  Patient is not a candidate for incretin therapy, metformin  therapy.   -She is advised to increase her Tresiba to 18 units daily with breakfast associated with monitoring of blood glucose at least 2 times a day-daily before breakfast and at bedtime.   -She is encouraged to call clinic if blood glucose readings are above 300 mg/dL or less than 70 mg/dL.  -  Suggestion is made for her to avoid simple carbohydrates  from her diet including Cakes, Sweet Desserts / Pastries, Ice Cream, Soda (diet and regular), Sweet Tea, Candies, Chips, Cookies, Store Bought Juices, Alcohol in Excess of  1-2 drinks a day, Artificial Sweeteners, and "Sugar-free" Products.  - I advised her to maintain close follow up with Caryl Bis, MD for primary care needs.  Follow up plan: Return in about 3 months (around 11/07/2018) for Meter, and Logs.   Thank you for involving me in the care of this pleasant patient, and I will continue to update you with her progress.  Glade Lloyd, MD [facility] Phone: [phone]  Fax: [phone]   08/08/2018, 3:17 PM  This note was partially dictated with voice recognition software. Similar sounding words can be transcribed inadequately or may not  be corrected upon review.

## 2018-08-10 DIAGNOSIS — R69 Illness, unspecified: Secondary | ICD-10-CM | POA: Diagnosis not present

## 2018-08-15 DIAGNOSIS — I130 Hypertensive heart and chronic kidney disease with heart failure and stage 1 through stage 4 chronic kidney disease, or unspecified chronic kidney disease: Secondary | ICD-10-CM | POA: Diagnosis not present

## 2018-08-15 DIAGNOSIS — D649 Anemia, unspecified: Secondary | ICD-10-CM | POA: Diagnosis not present

## 2018-08-15 DIAGNOSIS — N184 Chronic kidney disease, stage 4 (severe): Secondary | ICD-10-CM | POA: Diagnosis not present

## 2018-08-15 DIAGNOSIS — K7581 Nonalcoholic steatohepatitis (NASH): Secondary | ICD-10-CM | POA: Diagnosis not present

## 2018-08-15 DIAGNOSIS — R809 Proteinuria, unspecified: Secondary | ICD-10-CM | POA: Diagnosis not present

## 2018-08-15 DIAGNOSIS — N183 Chronic kidney disease, stage 3 (moderate): Secondary | ICD-10-CM | POA: Diagnosis not present

## 2018-08-15 DIAGNOSIS — D61818 Other pancytopenia: Secondary | ICD-10-CM | POA: Diagnosis not present

## 2018-08-15 DIAGNOSIS — I5030 Unspecified diastolic (congestive) heart failure: Secondary | ICD-10-CM | POA: Diagnosis not present

## 2018-08-15 DIAGNOSIS — Z794 Long term (current) use of insulin: Secondary | ICD-10-CM | POA: Diagnosis not present

## 2018-08-15 DIAGNOSIS — E1122 Type 2 diabetes mellitus with diabetic chronic kidney disease: Secondary | ICD-10-CM | POA: Diagnosis not present

## 2018-08-15 DIAGNOSIS — C641 Malignant neoplasm of right kidney, except renal pelvis: Secondary | ICD-10-CM | POA: Diagnosis not present

## 2018-08-15 DIAGNOSIS — N390 Urinary tract infection, site not specified: Secondary | ICD-10-CM | POA: Diagnosis not present

## 2018-08-15 DIAGNOSIS — N200 Calculus of kidney: Secondary | ICD-10-CM | POA: Diagnosis not present

## 2018-09-01 DIAGNOSIS — N184 Chronic kidney disease, stage 4 (severe): Secondary | ICD-10-CM | POA: Diagnosis not present

## 2018-09-01 DIAGNOSIS — D631 Anemia in chronic kidney disease: Secondary | ICD-10-CM | POA: Diagnosis not present

## 2018-09-08 DIAGNOSIS — D649 Anemia, unspecified: Secondary | ICD-10-CM | POA: Diagnosis not present

## 2018-09-13 ENCOUNTER — Ambulatory Visit: Payer: Managed Care, Other (non HMO) | Admitting: Nurse Practitioner

## 2018-09-14 DIAGNOSIS — I4891 Unspecified atrial fibrillation: Secondary | ICD-10-CM | POA: Diagnosis not present

## 2018-09-14 DIAGNOSIS — N184 Chronic kidney disease, stage 4 (severe): Secondary | ICD-10-CM | POA: Diagnosis not present

## 2018-09-14 DIAGNOSIS — I130 Hypertensive heart and chronic kidney disease with heart failure and stage 1 through stage 4 chronic kidney disease, or unspecified chronic kidney disease: Secondary | ICD-10-CM | POA: Diagnosis not present

## 2018-09-14 DIAGNOSIS — E1122 Type 2 diabetes mellitus with diabetic chronic kidney disease: Secondary | ICD-10-CM | POA: Diagnosis not present

## 2018-09-14 DIAGNOSIS — E0500 Thyrotoxicosis with diffuse goiter without thyrotoxic crisis or storm: Secondary | ICD-10-CM | POA: Diagnosis not present

## 2018-09-14 DIAGNOSIS — N200 Calculus of kidney: Secondary | ICD-10-CM | POA: Diagnosis not present

## 2018-09-14 DIAGNOSIS — K7460 Unspecified cirrhosis of liver: Secondary | ICD-10-CM | POA: Diagnosis not present

## 2018-09-14 DIAGNOSIS — I252 Old myocardial infarction: Secondary | ICD-10-CM | POA: Diagnosis not present

## 2018-09-14 DIAGNOSIS — D631 Anemia in chronic kidney disease: Secondary | ICD-10-CM | POA: Diagnosis not present

## 2018-09-14 DIAGNOSIS — I509 Heart failure, unspecified: Secondary | ICD-10-CM | POA: Diagnosis not present

## 2018-09-15 ENCOUNTER — Other Ambulatory Visit: Payer: Self-pay | Admitting: Nurse Practitioner

## 2018-09-15 DIAGNOSIS — D649 Anemia, unspecified: Secondary | ICD-10-CM | POA: Diagnosis not present

## 2018-09-28 DIAGNOSIS — R69 Illness, unspecified: Secondary | ICD-10-CM | POA: Diagnosis not present

## 2018-10-02 ENCOUNTER — Ambulatory Visit: Payer: Medicare HMO | Admitting: Nurse Practitioner

## 2018-10-02 ENCOUNTER — Encounter: Payer: Self-pay | Admitting: Nurse Practitioner

## 2018-10-02 VITALS — BP 120/60 | HR 72 | Ht 62.0 in | Wt 111.1 lb

## 2018-10-02 DIAGNOSIS — I259 Chronic ischemic heart disease, unspecified: Secondary | ICD-10-CM

## 2018-10-02 NOTE — Patient Instructions (Addendum)
We will be checking the following labs today - NONE    Medication Instructions:    Continue with your current medicines.    If you need a refill on your cardiac medications before your next appointment, please call your pharmacy.     Testing/Procedures To Be Arranged:  N/A  Follow-Up:   See me in one year. But let us know if you have worsening spells or more frequent spells of chest pain.     At [facility], you and your health needs are our priority.  As part of our continuing mission to provide you with exceptional heart care, we have created designated Provider Care Teams.  These Care Teams include your primary Cardiologist (physician) and Advanced Practice Providers (APPs -  Physician Assistants and Nurse Practitioners) who all work together to provide you with the care you need, when you need it.  Special Instructions:  . None  Call the [place] office at [phone] if you have any questions, problems or concerns.

## 2018-10-02 NOTE — Progress Notes (Signed)
CARDIOLOGY OFFICE NOTE  Date:  10/02/2018    [name] Date of Birth: 1941/08/14 Medical Record #[id number]  PCP:  Caryl Bis, MD  Cardiologist:  Jerel Shepherd   Chief Complaint  Patient presents with  . Follow-up    1 year check - seen for Dr. Burt Knack    History of Present Illness: [name] is a 78 y.o. female who presents today for a one year check. Seen for Dr. Burt Knack. Has predominately followed with me over the past several years.   She has a very complex history. She had a prolonged hospitalization back in 2014 with respiratory failure from pneumonia and sepsis. This was associated with a NSTEMI and found to have severe LV dysfunction with EF of 20 to 25%. Cath showed NO obstructive CAD - felt to have a stress induced CM. Did have atrial fib and was put on amiodarone and Xarelto but had difficulty with anemia and guaiac positive stools. Her Xarelto was stopped and she was kept on aspirin. Her EF did recover and was 50 to 55% per echo in January of 2015 and 55% in May of 2015.   Her other issues include HLD, GERD, and HTN. 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 Taken off of amiodarone. Off of her statin. Has had prior GI bleed and is no longer on any form of anticoagulation. Has had surgery for a renal cell carcinoma. She has had  hyperkalemia with Aldactone.   Last seen by me back a year ago - was felt to be doing ok - not very active. Multiple UTI's and on chronic antibiotics.   Comes in today. Here with her daughter. She feels like she is doing ok. Has a kidney stone - gets her stent out this week. Not getting much activity as a general rule. She does live alone in [place] - daughter is in [place]. Has stable DOE. Does note some chest pain - feels dull - will last for a few minutes or longer. Occurs at rest.  Last spell was about a week ago - just now telling her daughter. She says she tries to stop thinking about it and it goes away. Does not feel like it is indigestion. Not exertional.   Past Medical History:  Diagnosis Date  . 10 year risk of MI or stroke 7.5% or greater   . Bursitis   . Diabetes mellitus ([place])   . FHx: migraine headaches   . GERD (gastroesophageal reflux disease)   . Hypercholesterolemia   . Hypertension   . Renal lithiasis   . Takotsubo cardiomyopathy 06/2013    Past Surgical History:  Procedure Laterality Date  . CHOLECYSTECTOMY    . COLONOSCOPY  06/15/2005   RMR: Minimal internal hemorrhoids. Otherwise normal rectum/Normal colon  . COLONOSCOPY    12/14/2001   RMR: Internal hemorrhage, otherwise, normal rectum/ Polyps at 30 and 35 cm resected; the remainder of colonic mucosa appeared normal  . COLONOSCOPY  05/2009   RMR: minimal hemorrhoids, sigmoid hyperplastic polyp  . COLONOSCOPY N/A 10/24/2013   BWG:[id number] colonic polyps removed as described above/Colonic diverticulosis  . ESOPHAGOGASTRODUODENOSCOPY  06/12/2007   SVX:[id number] esophagus/patulous EG junction, moderate sized  hiatal hernia, otherwise normal stomach, D1, D2  . ESOPHAGOGASTRODUODENOSCOPY N/A 10/24/2013   RMR: Small hiatal hernia; otherwise, normal EGD  . HIP SURGERY  right  . LEFT HEART CATHETERIZATION WITH CORONARY ANGIOGRAM N/A 07/02/2013   Procedure: LEFT HEART CATHETERIZATION WITH CORONARY ANGIOGRAM;  Surgeon: Josue Hector, MD;  Location: [facility] CATH LAB;  Service: Cardiovascular;  Laterality: N/A;  . PARTIAL HYSTERECTOMY       Medications: Current Meds  Medication Sig  . buPROPion (WELLBUTRIN SR) 150 MG 12 hr tablet Take 150 mg by mouth daily.  . carvedilol (COREG) 6.25 MG tablet TAKE 1 TABLET BY MOUTH TWICE DAILY WITH A MEAL. Please keep upcoming appt for future refills. Thank you.  . cholecalciferol (VITAMIN D) 1000 UNITS tablet Take 2,000 Units by mouth daily.  Marland Kitchen FLUoxetine (PROZAC)  40 MG capsule Take 40 mg by mouth daily.  . furosemide (LASIX) 20 MG tablet TAKE ONE (1) TABLET EACH DAY  . insulin degludec (TRESIBA) 100 UNIT/ML SOPN FlexTouch Pen Inject 0.18 mLs (18 Units total) into the skin daily with breakfast.  . Insulin Pen Needle (BD PEN NEEDLE NANO U/F) 32G X 4 MM MISC 1 each by Does not apply route 4 (four) times daily.  . Multiple Vitamins-Minerals (PRESERVISION AREDS PO) Take by mouth.  Marland Kitchen omeprazole (PRILOSEC) 20 MG capsule Take 20 mg by mouth daily.   . ondansetron (ZOFRAN ODT) 4 MG disintegrating tablet Take 1 tablet (4 mg total) by mouth every 8 (eight) hours as needed for nausea.  . Probiotic Product (PHILLIPS COLON HEALTH PO) Take 1 tablet by mouth daily.  . traMADol (ULTRAM) 50 MG tablet Take 50 mg by mouth every 6 (six) hours as needed.  . traZODone (DESYREL) 50 MG tablet Take 50 mg by mouth at bedtime.   . Zinc 50 MG TABS Take by mouth as directed.     Allergies: Allergies  Allergen Reactions  . Codeine Other (See Comments)    Reaction unknown     Social History: The patient  reports that she quit smoking about 16 years ago. Her smoking use included cigarettes. She started smoking about 59 years ago. She has a 43.00 pack-year smoking history. She has never used smokeless tobacco. She reports that she does not drink alcohol or use drugs.   Family History: The patient's family history includes Colon cancer in her mother; Pancreatic cancer (age of onset: 71) in her father.   Review of Systems: Please see the history of present illness.   Otherwise, the review of systems is positive for none.   All other systems are reviewed and negative.   Physical Exam: VS:  BP 120/60 (BP Location: Left Arm, Patient Position: Sitting, Cuff Size: Normal)   Pulse 72   Ht 5\' 2"  (1.575 m)   Wt 111 lb 1.9 oz (50.4 kg)   SpO2 98% Comment: at rest  BMI 20.32 kg/m  .  BMI Body mass index is 20.32 kg/m.  Wt Readings from Last 3 Encounters:  10/02/18 111 lb 1.9 oz  (50.4 kg)  08/08/18 108 lb (49 kg)  07/25/18 110 lb (49.9 kg)    General:  Alert and in no acute distress.  Remains thin.  HEENT: Normal.  Neck: Supple, no JVD, carotid bruits, or masses noted.  Cardiac: Regular rate and rhythm. No murmurs, rubs, or gallops. Trace edema.  Respiratory:  Lungs are clear to auscultation bilaterally with normal work of breathing.  GI: Protruberant.  MS: No deformity or atrophy. Gait and ROM intact.  Skin: Warm and dry. Color is normal.  Neuro:  Strength and sensation are intact and no gross focal deficits noted.  Psych: Alert, appropriate  and with normal affect.   LABORATORY DATA:  EKG:  EKG is ordered today. Reviewed with Dr. Burt Knack - looks to show NSR - wandering atrial pacer.  Lab Results  Component Value Date   WBC 6.3 01/21/2018   HGB 10.6 (L) 01/21/2018   HCT 33.7 (L) 01/21/2018   PLT 89 (L) 01/21/2018   GLUCOSE 178 (H) 01/21/2018   ALT 26 01/21/2018   AST 36 01/21/2018   NA 131 (L) 01/21/2018   K 4.7 01/21/2018   CL 101 01/21/2018   CREATININE 2.28 (H) 01/21/2018   BUN 28 (H) 01/21/2018   CO2 21 (L) 01/21/2018   TSH 2.35 07/05/2018   INR 1.07 07/02/2013       BNP (last 3 results) No results for input(s): BNP in the last 8760 hours.  ProBNP (last 3 results) No results for input(s): PROBNP in the last 8760 hours.   Other Studies Reviewed Today:  ECHO SUMMARY MAY 2015 The left ventricular size is normal.There is mild concentric left ventricular  hypertrophy.Left ventricular systolic function is low normal.LV ejection  fraction = 50-55%.No segmental wall motion abnormalities seen in the left  ventricle.  The right ventricle is normal in size and function.  The left atrium is mildly dilated.  There is mild mitral regurgitation.  Mild pulmonary hypertension.Estimated right ventricular systolic pressure is  39 mmHg.  Small pericardial effusion.  There is no comparison study available.  Reading Physician:   [place], [id number] 01/11/2014 12:24 PM     Assessment / Plan:  1. Past stress induced CM - EF recovered per last echo from May of 2015. No symptoms of heart failure.   2. Chest pain - atypical - EKG ok - would favor conservative approach given all her medical issues and inability to be on anticoagulation. She did not have CAD 6 years ago - will follow for now - consider nitrate therapy if worsens.   3. PAF - off of amiodarone - - Remains in sinus by exam todayand by EKG. Not a candidate for anticoagulation due to prior bleeding. I would assume she is no longer a candidate for amiodarone given her cirrhosis. Fortunately, she remains in NSR.   4. Cirrhosis - off of her statin and no longer on amiodarone. Followed by [facility]  5.Renal cell carcinoma with prior surgical intervention - followed by [facility] at [facility].  6. CKD - stage III - IV - etiology related to recurrent pyelo/UTI and obstruction from nephrolithiasis and diabetic nephrology    Current medicines are reviewed with the patient today.  The patient does not have concerns regarding medicines other than what has been noted above.  The following changes have been made:  See above.  Labs/ tests ordered today include:   No orders of the defined types were placed in this encounter.    Disposition:   FU with me in 1 year.   Patient is agreeable to this plan and will call if any problems develop in the interim.   SignedTruitt Merle, NP  10/02/2018 3:51 PM  [facility] [address] [place], [place]  [address] Phone: [phone] Fax: [phone]

## 2018-10-03 DIAGNOSIS — N200 Calculus of kidney: Secondary | ICD-10-CM | POA: Diagnosis not present

## 2018-10-03 DIAGNOSIS — Z466 Encounter for fitting and adjustment of urinary device: Secondary | ICD-10-CM | POA: Diagnosis not present

## 2018-10-11 DIAGNOSIS — K7460 Unspecified cirrhosis of liver: Secondary | ICD-10-CM | POA: Diagnosis not present

## 2018-10-13 DIAGNOSIS — Z681 Body mass index (BMI) 19 or less, adult: Secondary | ICD-10-CM | POA: Diagnosis not present

## 2018-10-13 DIAGNOSIS — K7581 Nonalcoholic steatohepatitis (NASH): Secondary | ICD-10-CM | POA: Diagnosis not present

## 2018-10-13 DIAGNOSIS — K7460 Unspecified cirrhosis of liver: Secondary | ICD-10-CM | POA: Diagnosis not present

## 2018-10-13 DIAGNOSIS — D500 Iron deficiency anemia secondary to blood loss (chronic): Secondary | ICD-10-CM | POA: Diagnosis not present

## 2018-10-13 DIAGNOSIS — E1122 Type 2 diabetes mellitus with diabetic chronic kidney disease: Secondary | ICD-10-CM | POA: Diagnosis not present

## 2018-10-13 DIAGNOSIS — C641 Malignant neoplasm of right kidney, except renal pelvis: Secondary | ICD-10-CM | POA: Diagnosis not present

## 2018-10-13 DIAGNOSIS — I10 Essential (primary) hypertension: Secondary | ICD-10-CM | POA: Diagnosis not present

## 2018-10-13 DIAGNOSIS — D692 Other nonthrombocytopenic purpura: Secondary | ICD-10-CM | POA: Diagnosis not present

## 2018-10-24 DIAGNOSIS — D519 Vitamin B12 deficiency anemia, unspecified: Secondary | ICD-10-CM | POA: Diagnosis not present

## 2018-10-24 DIAGNOSIS — D529 Folate deficiency anemia, unspecified: Secondary | ICD-10-CM | POA: Diagnosis not present

## 2018-10-24 DIAGNOSIS — D649 Anemia, unspecified: Secondary | ICD-10-CM | POA: Diagnosis not present

## 2018-10-25 DIAGNOSIS — R531 Weakness: Secondary | ICD-10-CM | POA: Diagnosis not present

## 2018-10-25 DIAGNOSIS — D649 Anemia, unspecified: Secondary | ICD-10-CM | POA: Diagnosis not present

## 2018-10-25 DIAGNOSIS — R0602 Shortness of breath: Secondary | ICD-10-CM | POA: Diagnosis not present

## 2018-10-26 DIAGNOSIS — R531 Weakness: Secondary | ICD-10-CM | POA: Diagnosis not present

## 2018-10-26 DIAGNOSIS — D649 Anemia, unspecified: Secondary | ICD-10-CM | POA: Diagnosis not present

## 2018-10-26 DIAGNOSIS — R0602 Shortness of breath: Secondary | ICD-10-CM | POA: Diagnosis not present

## 2018-10-30 DIAGNOSIS — R69 Illness, unspecified: Secondary | ICD-10-CM | POA: Diagnosis not present

## 2018-10-31 DIAGNOSIS — E1165 Type 2 diabetes mellitus with hyperglycemia: Secondary | ICD-10-CM | POA: Diagnosis not present

## 2018-11-01 LAB — COMPLETE METABOLIC PANEL WITH GFR
AG Ratio: 1.3 (calc) (ref 1.0–2.5)
ALBUMIN MSPROF: 3.8 g/dL (ref 3.6–5.1)
ALT: 23 U/L (ref 6–29)
AST: 24 U/L (ref 10–35)
Alkaline phosphatase (APISO): 187 U/L — ABNORMAL HIGH (ref 37–153)
BUN/Creatinine Ratio: 13 (calc) (ref 6–22)
BUN: 34 mg/dL — ABNORMAL HIGH (ref 7–25)
CALCIUM: 8.3 mg/dL — AB (ref 8.6–10.4)
CO2: 27 mmol/L (ref 20–32)
CREATININE: 2.65 mg/dL — AB (ref 0.60–0.93)
Chloride: 102 mmol/L (ref 98–110)
GFR, EST NON AFRICAN AMERICAN: 17 mL/min/{1.73_m2} — AB (ref >=60)
GFR, Est African American: 19 mL/min/{1.73_m2} — ABNORMAL LOW (ref >=60)
GLOBULIN: 3 g/dL (ref 1.9–3.7)
Glucose, Bld: 132 mg/dL — ABNORMAL HIGH (ref 65–99)
Potassium: 3.8 mmol/L (ref 3.5–5.3)
SODIUM: 139 mmol/L (ref 135–146)
TOTAL PROTEIN: 6.8 g/dL (ref 6.1–8.1)
Total Bilirubin: 0.7 mg/dL (ref 0.2–1.2)

## 2018-11-01 LAB — HEMOGLOBIN A1C
Hgb A1c MFr Bld: 5.6 % of total Hgb (ref <5.7)
MEAN PLASMA GLUCOSE: 114 (calc)
eAG (mmol/L): 6.3 (calc)

## 2018-11-01 LAB — T4, FREE: Free T4: 1.1 ng/dL (ref 0.8–1.8)

## 2018-11-01 LAB — TSH: TSH: 1.74 mIU/L (ref 0.40–4.50)

## 2018-11-07 ENCOUNTER — Ambulatory Visit: Payer: Medicare HMO | Admitting: "Endocrinology

## 2018-11-07 ENCOUNTER — Encounter: Payer: Self-pay | Admitting: "Endocrinology

## 2018-11-07 VITALS — Ht 62.0 in

## 2018-11-07 DIAGNOSIS — E1165 Type 2 diabetes mellitus with hyperglycemia: Secondary | ICD-10-CM

## 2018-11-07 DIAGNOSIS — R7989 Other specified abnormal findings of blood chemistry: Secondary | ICD-10-CM | POA: Diagnosis not present

## 2018-11-07 NOTE — Progress Notes (Signed)
Endocrinology follow-up  Note    Subjective:    Patient ID: [name], female    DOB: Sep 27, 1940, PCP Caryl Bis, MD.   Past Medical History:  Diagnosis Date  . 10 year risk of MI or stroke 7.5% or greater   . Bursitis   . Diabetes mellitus ([place])   . FHx: migraine headaches   . GERD (gastroesophageal reflux disease)   . Hypercholesterolemia   . Hypertension   . Renal lithiasis   . Takotsubo cardiomyopathy 06/2013   Past Surgical History:  Procedure Laterality Date  . CHOLECYSTECTOMY    . COLONOSCOPY  06/15/2005   RMR: Minimal internal hemorrhoids. Otherwise normal rectum/Normal colon  . COLONOSCOPY    12/14/2001   RMR: Internal hemorrhage, otherwise, normal rectum/ Polyps at 30 and 35 cm resected; the remainder of colonic mucosa appeared normal  . COLONOSCOPY  05/2009   RMR: minimal hemorrhoids, sigmoid hyperplastic polyp  . COLONOSCOPY N/A 10/24/2013   HYQ:MVHQIONG colonic polyps removed as described above/Colonic diverticulosis  . ESOPHAGOGASTRODUODENOSCOPY  06/12/2007   EXB:MWUXLK esophagus/patulous EG junction, moderate sized  hiatal hernia, otherwise normal stomach, D1, D2  . ESOPHAGOGASTRODUODENOSCOPY N/A 10/24/2013   RMR: Small hiatal hernia; otherwise, normal EGD  . HIP SURGERY     right  . LEFT HEART CATHETERIZATION WITH CORONARY ANGIOGRAM N/A 07/02/2013   Procedure: LEFT HEART CATHETERIZATION WITH CORONARY ANGIOGRAM;  Surgeon: Josue Hector, MD;  Location: [facility] CATH LAB;  Service: Cardiovascular;  Laterality: N/A;  . PARTIAL HYSTERECTOMY     Social History   Socioeconomic History  . Marital status: Divorced    Spouse name: Not on file  . Number of children: 3  . Years of education: Not on file  . Highest education level: Not on file  Occupational History  . 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 Financial resource strain: Not on file  . Food insecurity:    Worry: Not on file    Inability: Not on file   . Transportation needs:    Medical: Not on file    Non-medical: Not on file  Tobacco Use  . Smoking status: Former Smoker    Packs/day: 1.00    Years: 43.00    Pack years: 43.00    Types: Cigarettes    Start date: 08/31/1959    Last attempt to quit: 08/30/2002    Years since quitting: 16.2  . Smokeless tobacco: Never Used  . Tobacco comment: Quit x 10 years  Substance and Sexual Activity  . Alcohol use: No  . Drug use: No  . Sexual activity: Not on file  Lifestyle  . Physical activity:    Days per week: Not on file    Minutes per session: Not on file  . Stress: Not on file  Relationships  . Social connections:    Talks on phone: Not on file    Gets together: Not on file    Attends religious service: Not on file    Active member of club or organization: Not on file    Attends meetings of clubs or organizations: Not on file    Relationship status: Not on file  Other Topics Concern  . Not on file  Social History Narrative  . Not on file   Outpatient Encounter Medications as of 11/07/2018  Medication Sig  . buPROPion (WELLBUTRIN SR) 150 MG 12 hr tablet Take 150 mg by mouth daily.  . carvedilol (COREG) 6.25 MG  tablet TAKE 1 TABLET BY MOUTH TWICE DAILY WITH A MEAL. Please keep upcoming appt for future refills. Thank you.  . cholecalciferol (VITAMIN D) 1000 UNITS tablet Take 2,000 Units by mouth daily.  Marland Kitchen FLUoxetine (PROZAC) 40 MG capsule Take 40 mg by mouth daily.  . furosemide (LASIX) 20 MG tablet TAKE ONE (1) TABLET EACH DAY  . insulin degludec (TRESIBA) 100 UNIT/ML SOPN FlexTouch Pen Inject 0.18 mLs (18 Units total) into the skin daily with breakfast.  . Insulin Pen Needle (BD PEN NEEDLE NANO U/F) 32G X 4 MM MISC 1 each by Does not apply route 4 (four) times daily.  . Multiple Vitamins-Minerals (PRESERVISION AREDS PO) Take by mouth.  Marland Kitchen omeprazole (PRILOSEC) 20 MG capsule Take 20 mg by mouth daily.   . ondansetron (ZOFRAN ODT) 4 MG disintegrating tablet Take 1 tablet (4 mg  total) by mouth every 8 (eight) hours as needed for nausea.  . Probiotic Product (PHILLIPS COLON HEALTH PO) Take 1 tablet by mouth daily.  . traMADol (ULTRAM) 50 MG tablet Take 50 mg by mouth every 6 (six) hours as needed.  . traZODone (DESYREL) 50 MG tablet Take 50 mg by mouth at bedtime.   . Zinc 50 MG TABS Take by mouth as directed.   No facility-administered encounter medications on file as of 11/07/2018.     ALLERGIES: Allergies  Allergen Reactions  . Codeine Other (See Comments)    Reaction unknown     VACCINATION STATUS:  There is no immunization history on file for this patient.   HPI  [name] is 78 y.o. female who presents today with a medical history as above. she is being seen in follow-up after she was initiated on low-dose basal insulin for uncontrolled glycemia during her last visit.   -She is accompanied by her grown daughter today to clinic.  Her blood glucose readings are near target, with A1c of 5.6% (however she is known to have anemia which requires intermittent transfusion).  Her meter averages 183 over the last 14 days. -She has no documented or reported hypoglycemia.  -Her previsit thyroid function tests are also within normal target, she is off of methimazole.   She has no new complaints.    she denies dysphagia, choking, shortness of breath, no recent voice change.    she reports family history of thyroid dysfunction in her sister who has hypothyroidism, but denies family hx of thyroid cancer. she denies personal history of goiter. she is not on any anti-thyroid medications nor on any thyroid hormone supplements.   -She is hard of hearing, a poor historian.                           Review of systems  Constitutional: + Slightly fluctuating body weight , + fatigue, + subjective hyperthermia Eyes: no blurry vision, - xerophthalmia ENT: no sore throat, no nodules palpated in throat, no dysphagia/odynophagia, nor hoarseness Cardiovascular: no  Chest Pain, no Shortness of Breath, -  palpitations, no leg swelling Musculoskeletal: no muscle/joint aches Skin: no rashes Neurological: -  tremors, no numbness, no tingling, no dizziness Psychiatric: no depression, +  anxiety   Objective:    Ht 5\' 2"  (1.575 m)   BMI 20.32 kg/m   Wt Readings from Last 3 Encounters:  10/02/18 111 lb 1.9 oz (50.4 kg)  08/08/18 108 lb (49 kg)  07/25/18 110 lb (49.9 kg)  Physical exam  Constitutional: + light weight, no acute distress, + chronically sick looking, however has normal state of mind.   Eyes: PERRLA, EOMI, - exophthalmos ENT: moist mucous membranes, -  thyromegaly, no cervical lymphadenopathy Musculoskeletal: no gross deformities, strength intact in all four extremities Skin: moist, warm, no rashes Neurological:   - tremor with outstretched hands    CMP     Component Value Date/Time   NA 139 10/31/2018 1223   K 3.8 10/31/2018 1223   CL 102 10/31/2018 1223   CO2 27 10/31/2018 1223   GLUCOSE 132 (H) 10/31/2018 1223   BUN 34 (H) 10/31/2018 1223   BUN 24 (A) 05/24/2013   CREATININE 2.65 (H) 10/31/2018 1223   CALCIUM 8.3 (L) 10/31/2018 1223   PROT 6.8 10/31/2018 1223   ALBUMIN 3.2 (L) 01/21/2018 1359   ALBUMIN 4.2 05/24/2013   AST 24 10/31/2018 1223   AST 36 05/24/2013   ALT 23 10/31/2018 1223   ALKPHOS 137 (H) 01/21/2018 1359   ALKPHOS 67 05/24/2013   BILITOT 0.7 10/31/2018 1223   BILITOT 0.2 05/24/2013   GFRNONAA 17 (L) 10/31/2018 1223   GFRAA 19 (L) 10/31/2018 1223     CBC    Component Value Date/Time   WBC 6.3 01/21/2018 1359   RBC 4.10 01/21/2018 1359   HGB 10.6 (L) 01/21/2018 1359   HGB 8.6 05/24/2013   HCT 33.7 (L) 01/21/2018 1359   HCT 28 05/24/2013   PLT 89 (L) 01/21/2018 1359   MCV 82.2 01/21/2018 1359   MCH 25.9 (L) 01/21/2018 1359   MCHC 31.5 01/21/2018 1359   RDW 15.5 01/21/2018 1359   LYMPHSABS 0.7 01/21/2018 1359   MONOABS 0.7 01/21/2018 1359    EOSABS 0.0 01/21/2018 1359   BASOSABS 0.0 01/21/2018 1359       Lab Results  Component Value Date   TSH 1.74 10/31/2018   TSH 2.35 07/05/2018   TSH 1.31 03/13/2014   FREET4 1.1 10/31/2018   FREET4 1.2 07/05/2018    May 08, 2018 TSH 0.229, total T4 7 Thyroid uptake and scan on May 16, 2018, 47% uniform uptake with no focal abnormalities consistent with Graves' disease.   Assessment & Plan:   1. Hyperthyroidism -Her visit thyroid function tests are consistent with euthyroid state.  She will not require antithyroid intervention at this time.    2.  Type 2 diabetes-uncontrolled -Her blood glucose logs for the last 14 days shows improved glycemic profile, averaging 183 for the last 14 days.  Her presenting A1c of 5.6%, likely underestimated by her concurrent anemia which requires intermittent transfusion.    - She has complications including renal insufficiency, and cardiomyopathy.  Patient is not a candidate for incretin therapy, metformin  therapy.  She will continue to benefit from simplified treatment with only basal insulin.  She is advised to continue Tresiba 18 units daily at breakfast associated with monitoring of blood glucose 2 times a day-daily before breakfast and at bedtime.   -She is encouraged to call clinic if blood glucose readings are above 300 mg/dL or less than 70 mg/dL.  -  Suggestion is made for her to avoid simple carbohydrates  from her diet including Cakes, Sweet Desserts / Pastries, Ice Cream, Soda (diet and regular), Sweet Tea, Candies, Chips, Cookies, Store Bought Juices, Alcohol in Excess of  1-2 drinks a day, Artificial Sweeteners, and "Sugar-free" Products.    - I advised her to maintain close follow up with Caryl Bis, MD for primary care  needs.  Follow up plan: Return in about 6 months (around 05/10/2019) for Follow up with Pre-visit Labs, Meter, and Logs.   Thank you for involving me in the care of this pleasant patient, and I will  continue to update you with her progress.  Glade Lloyd, MD [facility] Phone: [phone]  Fax: [phone]   11/07/2018, 5:01 PM  This note was partially dictated with voice recognition software. Similar sounding words can be transcribed inadequately or may not  be corrected upon review.

## 2018-11-17 DIAGNOSIS — R69 Illness, unspecified: Secondary | ICD-10-CM | POA: Diagnosis not present

## 2018-12-05 DIAGNOSIS — Z9114 Patient's other noncompliance with medication regimen: Secondary | ICD-10-CM | POA: Diagnosis not present

## 2018-12-05 DIAGNOSIS — Z87440 Personal history of urinary (tract) infections: Secondary | ICD-10-CM | POA: Diagnosis not present

## 2018-12-05 DIAGNOSIS — R809 Proteinuria, unspecified: Secondary | ICD-10-CM | POA: Diagnosis not present

## 2018-12-05 DIAGNOSIS — D509 Iron deficiency anemia, unspecified: Secondary | ICD-10-CM | POA: Diagnosis not present

## 2018-12-05 DIAGNOSIS — N184 Chronic kidney disease, stage 4 (severe): Secondary | ICD-10-CM | POA: Diagnosis not present

## 2018-12-05 DIAGNOSIS — E785 Hyperlipidemia, unspecified: Secondary | ICD-10-CM | POA: Diagnosis not present

## 2018-12-09 DIAGNOSIS — D692 Other nonthrombocytopenic purpura: Secondary | ICD-10-CM | POA: Diagnosis not present

## 2018-12-09 DIAGNOSIS — N281 Cyst of kidney, acquired: Secondary | ICD-10-CM | POA: Diagnosis not present

## 2018-12-09 DIAGNOSIS — K7581 Nonalcoholic steatohepatitis (NASH): Secondary | ICD-10-CM | POA: Diagnosis not present

## 2018-12-09 DIAGNOSIS — C641 Malignant neoplasm of right kidney, except renal pelvis: Secondary | ICD-10-CM | POA: Diagnosis not present

## 2018-12-09 DIAGNOSIS — I10 Essential (primary) hypertension: Secondary | ICD-10-CM | POA: Diagnosis not present

## 2018-12-09 DIAGNOSIS — D500 Iron deficiency anemia secondary to blood loss (chronic): Secondary | ICD-10-CM | POA: Diagnosis not present

## 2018-12-09 DIAGNOSIS — E1122 Type 2 diabetes mellitus with diabetic chronic kidney disease: Secondary | ICD-10-CM | POA: Diagnosis not present

## 2018-12-09 DIAGNOSIS — K7460 Unspecified cirrhosis of liver: Secondary | ICD-10-CM | POA: Diagnosis not present

## 2018-12-11 ENCOUNTER — Other Ambulatory Visit (HOSPITAL_COMMUNITY)
Admission: RE | Admit: 2018-12-11 | Discharge: 2018-12-11 | Disposition: A | Discharge status: Home / Self Care | Payer: Medicare HMO | Source: Ambulatory Visit | Attending: Family Medicine | Admitting: Family Medicine

## 2018-12-11 DIAGNOSIS — D500 Iron deficiency anemia secondary to blood loss (chronic): Secondary | ICD-10-CM | POA: Diagnosis not present

## 2018-12-11 DIAGNOSIS — E1122 Type 2 diabetes mellitus with diabetic chronic kidney disease: Secondary | ICD-10-CM | POA: Diagnosis not present

## 2018-12-11 DIAGNOSIS — N189 Chronic kidney disease, unspecified: Secondary | ICD-10-CM | POA: Diagnosis not present

## 2018-12-11 DIAGNOSIS — C641 Malignant neoplasm of right kidney, except renal pelvis: Secondary | ICD-10-CM | POA: Diagnosis not present

## 2018-12-11 DIAGNOSIS — K922 Gastrointestinal hemorrhage, unspecified: Secondary | ICD-10-CM | POA: Diagnosis not present

## 2018-12-11 DIAGNOSIS — I5030 Unspecified diastolic (congestive) heart failure: Secondary | ICD-10-CM | POA: Diagnosis not present

## 2018-12-11 DIAGNOSIS — D696 Thrombocytopenia, unspecified: Secondary | ICD-10-CM | POA: Diagnosis not present

## 2018-12-11 DIAGNOSIS — R6889 Other general symptoms and signs: Secondary | ICD-10-CM | POA: Diagnosis not present

## 2018-12-11 DIAGNOSIS — I2510 Atherosclerotic heart disease of native coronary artery without angina pectoris: Secondary | ICD-10-CM | POA: Diagnosis not present

## 2018-12-11 DIAGNOSIS — K7581 Nonalcoholic steatohepatitis (NASH): Secondary | ICD-10-CM | POA: Diagnosis not present

## 2018-12-11 DIAGNOSIS — I132 Hypertensive heart and chronic kidney disease with heart failure and with stage 5 chronic kidney disease, or end stage renal disease: Secondary | ICD-10-CM | POA: Diagnosis not present

## 2018-12-11 DIAGNOSIS — N281 Cyst of kidney, acquired: Secondary | ICD-10-CM | POA: Diagnosis not present

## 2018-12-11 DIAGNOSIS — K766 Portal hypertension: Secondary | ICD-10-CM | POA: Diagnosis not present

## 2018-12-11 DIAGNOSIS — K7460 Unspecified cirrhosis of liver: Secondary | ICD-10-CM | POA: Diagnosis not present

## 2018-12-11 DIAGNOSIS — I10 Essential (primary) hypertension: Secondary | ICD-10-CM | POA: Diagnosis not present

## 2018-12-11 DIAGNOSIS — N184 Chronic kidney disease, stage 4 (severe): Secondary | ICD-10-CM | POA: Diagnosis not present

## 2018-12-11 DIAGNOSIS — R69 Illness, unspecified: Secondary | ICD-10-CM | POA: Diagnosis not present

## 2018-12-11 DIAGNOSIS — T82534D Leakage of infusion catheter, subsequent encounter: Secondary | ICD-10-CM | POA: Diagnosis not present

## 2018-12-11 DIAGNOSIS — I252 Old myocardial infarction: Secondary | ICD-10-CM | POA: Diagnosis not present

## 2018-12-11 DIAGNOSIS — Z6820 Body mass index (BMI) 20.0-20.9, adult: Secondary | ICD-10-CM | POA: Diagnosis not present

## 2018-12-11 DIAGNOSIS — N179 Acute kidney failure, unspecified: Secondary | ICD-10-CM | POA: Diagnosis not present

## 2018-12-11 DIAGNOSIS — I8501 Esophageal varices with bleeding: Secondary | ICD-10-CM | POA: Diagnosis not present

## 2018-12-11 DIAGNOSIS — D692 Other nonthrombocytopenic purpura: Secondary | ICD-10-CM | POA: Diagnosis not present

## 2018-12-11 DIAGNOSIS — D539 Nutritional anemia, unspecified: Secondary | ICD-10-CM | POA: Diagnosis not present

## 2018-12-11 DIAGNOSIS — R188 Other ascites: Secondary | ICD-10-CM | POA: Diagnosis not present

## 2018-12-11 DIAGNOSIS — Z85528 Personal history of other malignant neoplasm of kidney: Secondary | ICD-10-CM | POA: Diagnosis not present

## 2018-12-11 DIAGNOSIS — I130 Hypertensive heart and chronic kidney disease with heart failure and stage 1 through stage 4 chronic kidney disease, or unspecified chronic kidney disease: Secondary | ICD-10-CM | POA: Diagnosis not present

## 2018-12-11 DIAGNOSIS — E875 Hyperkalemia: Secondary | ICD-10-CM | POA: Diagnosis not present

## 2018-12-11 DIAGNOSIS — I700 Atherosclerosis of aorta: Secondary | ICD-10-CM | POA: Diagnosis not present

## 2018-12-11 DIAGNOSIS — E785 Hyperlipidemia, unspecified: Secondary | ICD-10-CM | POA: Diagnosis not present

## 2018-12-11 DIAGNOSIS — N186 End stage renal disease: Secondary | ICD-10-CM | POA: Diagnosis not present

## 2018-12-11 LAB — COMPREHENSIVE METABOLIC PANEL
ALT: 15 U/L (ref 0–44)
AST: 16 U/L (ref 15–41)
Albumin: 2.7 g/dL — ABNORMAL LOW (ref 3.5–5.0)
Alkaline Phosphatase: 130 U/L — ABNORMAL HIGH (ref 38–126)
Anion gap: 10 (ref 5–15)
BUN: 63 mg/dL — ABNORMAL HIGH (ref 8–23)
CO2: 24 mmol/L (ref 22–32)
Calcium: 7.5 mg/dL — ABNORMAL LOW (ref 8.9–10.3)
Chloride: 101 mmol/L (ref 98–111)
Creatinine, Ser: 3.7 mg/dL — ABNORMAL HIGH (ref 0.44–1.00)
GFR calc Af Amer: 13 mL/min — ABNORMAL LOW (ref >60)
GFR calc non Af Amer: 11 mL/min — ABNORMAL LOW (ref >60)
Glucose, Bld: 102 mg/dL — ABNORMAL HIGH (ref 70–99)
Potassium: 3.2 mmol/L — ABNORMAL LOW (ref 3.5–5.1)
Sodium: 135 mmol/L (ref 135–145)
Total Bilirubin: 0.7 mg/dL (ref 0.3–1.2)
Total Protein: 5.4 g/dL — ABNORMAL LOW (ref 6.5–8.1)

## 2018-12-11 LAB — CBC
HCT: 15.8 % — ABNORMAL LOW (ref 36.0–46.0)
Hemoglobin: 4.8 g/dL — CL (ref 12.0–15.0)
MCH: 30.4 pg (ref 26.0–34.0)
MCHC: 30.4 g/dL (ref 30.0–36.0)
MCV: 100 fL (ref 80.0–100.0)
Platelets: 152 10*3/uL (ref 150–400)
RBC: 1.58 MIL/uL — ABNORMAL LOW (ref 3.87–5.11)
RDW: 18.4 % — ABNORMAL HIGH (ref 11.5–15.5)
WBC: 4.4 10*3/uL (ref 4.0–10.5)
nRBC: 0 % (ref 0.0–0.2)

## 2018-12-12 DIAGNOSIS — R0602 Shortness of breath: Secondary | ICD-10-CM | POA: Diagnosis not present

## 2018-12-12 DIAGNOSIS — R531 Weakness: Secondary | ICD-10-CM | POA: Diagnosis not present

## 2018-12-12 DIAGNOSIS — D649 Anemia, unspecified: Secondary | ICD-10-CM | POA: Diagnosis not present

## 2018-12-13 DIAGNOSIS — D649 Anemia, unspecified: Secondary | ICD-10-CM | POA: Diagnosis not present

## 2018-12-13 DIAGNOSIS — N2889 Other specified disorders of kidney and ureter: Secondary | ICD-10-CM | POA: Diagnosis not present

## 2018-12-13 DIAGNOSIS — R69 Illness, unspecified: Secondary | ICD-10-CM | POA: Diagnosis not present

## 2018-12-13 DIAGNOSIS — N184 Chronic kidney disease, stage 4 (severe): Secondary | ICD-10-CM | POA: Diagnosis not present

## 2018-12-13 DIAGNOSIS — K449 Diaphragmatic hernia without obstruction or gangrene: Secondary | ICD-10-CM | POA: Diagnosis not present

## 2018-12-13 DIAGNOSIS — E871 Hypo-osmolality and hyponatremia: Secondary | ICD-10-CM | POA: Diagnosis not present

## 2018-12-13 DIAGNOSIS — N185 Chronic kidney disease, stage 5: Secondary | ICD-10-CM | POA: Diagnosis not present

## 2018-12-13 DIAGNOSIS — I5042 Chronic combined systolic (congestive) and diastolic (congestive) heart failure: Secondary | ICD-10-CM | POA: Diagnosis not present

## 2018-12-13 DIAGNOSIS — K7460 Unspecified cirrhosis of liver: Secondary | ICD-10-CM | POA: Diagnosis not present

## 2018-12-13 DIAGNOSIS — K7290 Hepatic failure, unspecified without coma: Secondary | ICD-10-CM | POA: Diagnosis not present

## 2018-12-13 DIAGNOSIS — N170 Acute kidney failure with tubular necrosis: Secondary | ICD-10-CM | POA: Diagnosis not present

## 2018-12-13 DIAGNOSIS — K766 Portal hypertension: Secondary | ICD-10-CM | POA: Diagnosis not present

## 2018-12-13 DIAGNOSIS — D696 Thrombocytopenia, unspecified: Secondary | ICD-10-CM | POA: Diagnosis not present

## 2018-12-13 DIAGNOSIS — N200 Calculus of kidney: Secondary | ICD-10-CM | POA: Diagnosis not present

## 2018-12-13 DIAGNOSIS — E611 Iron deficiency: Secondary | ICD-10-CM | POA: Diagnosis not present

## 2018-12-13 DIAGNOSIS — Z794 Long term (current) use of insulin: Secondary | ICD-10-CM | POA: Diagnosis not present

## 2018-12-13 DIAGNOSIS — I8500 Esophageal varices without bleeding: Secondary | ICD-10-CM | POA: Diagnosis not present

## 2018-12-13 DIAGNOSIS — D6489 Other specified anemias: Secondary | ICD-10-CM | POA: Diagnosis not present

## 2018-12-13 DIAGNOSIS — R140 Abdominal distension (gaseous): Secondary | ICD-10-CM | POA: Diagnosis not present

## 2018-12-13 DIAGNOSIS — Y844 Aspiration of fluid as the cause of abnormal reaction of the patient, or of later complication, without mention of misadventure at the time of the procedure: Secondary | ICD-10-CM | POA: Diagnosis not present

## 2018-12-13 DIAGNOSIS — R6881 Early satiety: Secondary | ICD-10-CM | POA: Diagnosis not present

## 2018-12-13 DIAGNOSIS — Z87442 Personal history of urinary calculi: Secondary | ICD-10-CM | POA: Diagnosis not present

## 2018-12-13 DIAGNOSIS — R188 Other ascites: Secondary | ICD-10-CM | POA: Diagnosis not present

## 2018-12-13 DIAGNOSIS — I509 Heart failure, unspecified: Secondary | ICD-10-CM | POA: Diagnosis not present

## 2018-12-13 DIAGNOSIS — E8809 Other disorders of plasma-protein metabolism, not elsewhere classified: Secondary | ICD-10-CM | POA: Diagnosis not present

## 2018-12-13 DIAGNOSIS — N179 Acute kidney failure, unspecified: Secondary | ICD-10-CM | POA: Diagnosis not present

## 2018-12-13 DIAGNOSIS — I5032 Chronic diastolic (congestive) heart failure: Secondary | ICD-10-CM | POA: Diagnosis not present

## 2018-12-13 DIAGNOSIS — I5030 Unspecified diastolic (congestive) heart failure: Secondary | ICD-10-CM | POA: Diagnosis not present

## 2018-12-13 DIAGNOSIS — I429 Cardiomyopathy, unspecified: Secondary | ICD-10-CM | POA: Diagnosis not present

## 2018-12-13 DIAGNOSIS — D509 Iron deficiency anemia, unspecified: Secondary | ICD-10-CM | POA: Diagnosis not present

## 2018-12-13 DIAGNOSIS — R531 Weakness: Secondary | ICD-10-CM | POA: Diagnosis not present

## 2018-12-13 DIAGNOSIS — K7581 Nonalcoholic steatohepatitis (NASH): Secondary | ICD-10-CM | POA: Diagnosis not present

## 2018-12-13 DIAGNOSIS — R0602 Shortness of breath: Secondary | ICD-10-CM | POA: Diagnosis not present

## 2018-12-13 DIAGNOSIS — K921 Melena: Secondary | ICD-10-CM | POA: Diagnosis not present

## 2018-12-13 DIAGNOSIS — K7469 Other cirrhosis of liver: Secondary | ICD-10-CM | POA: Diagnosis not present

## 2018-12-13 DIAGNOSIS — D638 Anemia in other chronic diseases classified elsewhere: Secondary | ICD-10-CM | POA: Diagnosis not present

## 2018-12-13 DIAGNOSIS — E11649 Type 2 diabetes mellitus with hypoglycemia without coma: Secondary | ICD-10-CM | POA: Diagnosis not present

## 2018-12-13 DIAGNOSIS — E1122 Type 2 diabetes mellitus with diabetic chronic kidney disease: Secondary | ICD-10-CM | POA: Diagnosis not present

## 2018-12-13 DIAGNOSIS — Z515 Encounter for palliative care: Secondary | ICD-10-CM | POA: Diagnosis not present

## 2018-12-13 DIAGNOSIS — I130 Hypertensive heart and chronic kidney disease with heart failure and stage 1 through stage 4 chronic kidney disease, or unspecified chronic kidney disease: Secondary | ICD-10-CM | POA: Diagnosis not present

## 2018-12-13 DIAGNOSIS — R0600 Dyspnea, unspecified: Secondary | ICD-10-CM | POA: Diagnosis not present

## 2018-12-13 DIAGNOSIS — T8149XA Infection following a procedure, other surgical site, initial encounter: Secondary | ICD-10-CM | POA: Diagnosis not present

## 2018-12-13 DIAGNOSIS — I8510 Secondary esophageal varices without bleeding: Secondary | ICD-10-CM | POA: Diagnosis not present

## 2018-12-13 DIAGNOSIS — L03818 Cellulitis of other sites: Secondary | ICD-10-CM | POA: Diagnosis not present

## 2018-12-13 DIAGNOSIS — R9431 Abnormal electrocardiogram [ECG] [EKG]: Secondary | ICD-10-CM | POA: Diagnosis not present

## 2018-12-13 DIAGNOSIS — L03311 Cellulitis of abdominal wall: Secondary | ICD-10-CM | POA: Diagnosis not present

## 2018-12-14 DIAGNOSIS — E8809 Other disorders of plasma-protein metabolism, not elsewhere classified: Secondary | ICD-10-CM | POA: Diagnosis not present

## 2018-12-14 DIAGNOSIS — E1122 Type 2 diabetes mellitus with diabetic chronic kidney disease: Secondary | ICD-10-CM | POA: Diagnosis not present

## 2018-12-14 DIAGNOSIS — K7581 Nonalcoholic steatohepatitis (NASH): Secondary | ICD-10-CM | POA: Diagnosis not present

## 2018-12-14 DIAGNOSIS — R188 Other ascites: Secondary | ICD-10-CM | POA: Diagnosis not present

## 2018-12-14 DIAGNOSIS — R9431 Abnormal electrocardiogram [ECG] [EKG]: Secondary | ICD-10-CM | POA: Diagnosis not present

## 2018-12-14 DIAGNOSIS — E871 Hypo-osmolality and hyponatremia: Secondary | ICD-10-CM | POA: Diagnosis not present

## 2018-12-14 DIAGNOSIS — K7469 Other cirrhosis of liver: Secondary | ICD-10-CM | POA: Diagnosis not present

## 2018-12-14 DIAGNOSIS — N179 Acute kidney failure, unspecified: Secondary | ICD-10-CM | POA: Diagnosis not present

## 2018-12-14 DIAGNOSIS — N184 Chronic kidney disease, stage 4 (severe): Secondary | ICD-10-CM | POA: Diagnosis not present

## 2018-12-14 DIAGNOSIS — D696 Thrombocytopenia, unspecified: Secondary | ICD-10-CM | POA: Diagnosis not present

## 2018-12-14 DIAGNOSIS — D509 Iron deficiency anemia, unspecified: Secondary | ICD-10-CM | POA: Diagnosis not present

## 2018-12-14 DIAGNOSIS — I5030 Unspecified diastolic (congestive) heart failure: Secondary | ICD-10-CM | POA: Diagnosis not present

## 2018-12-15 DIAGNOSIS — R188 Other ascites: Secondary | ICD-10-CM | POA: Diagnosis not present

## 2018-12-15 DIAGNOSIS — N184 Chronic kidney disease, stage 4 (severe): Secondary | ICD-10-CM | POA: Diagnosis not present

## 2018-12-15 DIAGNOSIS — E871 Hypo-osmolality and hyponatremia: Secondary | ICD-10-CM | POA: Diagnosis not present

## 2018-12-15 DIAGNOSIS — N179 Acute kidney failure, unspecified: Secondary | ICD-10-CM | POA: Diagnosis not present

## 2018-12-15 DIAGNOSIS — E8809 Other disorders of plasma-protein metabolism, not elsewhere classified: Secondary | ICD-10-CM | POA: Diagnosis not present

## 2018-12-15 DIAGNOSIS — D696 Thrombocytopenia, unspecified: Secondary | ICD-10-CM | POA: Diagnosis not present

## 2018-12-15 DIAGNOSIS — E1122 Type 2 diabetes mellitus with diabetic chronic kidney disease: Secondary | ICD-10-CM | POA: Diagnosis not present

## 2018-12-15 DIAGNOSIS — I130 Hypertensive heart and chronic kidney disease with heart failure and stage 1 through stage 4 chronic kidney disease, or unspecified chronic kidney disease: Secondary | ICD-10-CM | POA: Diagnosis not present

## 2018-12-15 DIAGNOSIS — D509 Iron deficiency anemia, unspecified: Secondary | ICD-10-CM | POA: Diagnosis not present

## 2018-12-15 DIAGNOSIS — I5030 Unspecified diastolic (congestive) heart failure: Secondary | ICD-10-CM | POA: Diagnosis not present

## 2018-12-15 DIAGNOSIS — K7581 Nonalcoholic steatohepatitis (NASH): Secondary | ICD-10-CM | POA: Diagnosis not present

## 2018-12-15 DIAGNOSIS — K7469 Other cirrhosis of liver: Secondary | ICD-10-CM | POA: Diagnosis not present

## 2018-12-16 DIAGNOSIS — D649 Anemia, unspecified: Secondary | ICD-10-CM | POA: Diagnosis not present

## 2018-12-16 DIAGNOSIS — N184 Chronic kidney disease, stage 4 (severe): Secondary | ICD-10-CM | POA: Diagnosis not present

## 2018-12-16 DIAGNOSIS — E1122 Type 2 diabetes mellitus with diabetic chronic kidney disease: Secondary | ICD-10-CM | POA: Diagnosis not present

## 2018-12-16 DIAGNOSIS — I5030 Unspecified diastolic (congestive) heart failure: Secondary | ICD-10-CM | POA: Diagnosis not present

## 2018-12-16 DIAGNOSIS — D509 Iron deficiency anemia, unspecified: Secondary | ICD-10-CM | POA: Diagnosis not present

## 2018-12-16 DIAGNOSIS — K7469 Other cirrhosis of liver: Secondary | ICD-10-CM | POA: Diagnosis not present

## 2018-12-16 DIAGNOSIS — K7581 Nonalcoholic steatohepatitis (NASH): Secondary | ICD-10-CM | POA: Diagnosis not present

## 2018-12-16 DIAGNOSIS — E871 Hypo-osmolality and hyponatremia: Secondary | ICD-10-CM | POA: Diagnosis not present

## 2018-12-16 DIAGNOSIS — E8809 Other disorders of plasma-protein metabolism, not elsewhere classified: Secondary | ICD-10-CM | POA: Diagnosis not present

## 2018-12-16 DIAGNOSIS — R188 Other ascites: Secondary | ICD-10-CM | POA: Diagnosis not present

## 2018-12-16 DIAGNOSIS — N179 Acute kidney failure, unspecified: Secondary | ICD-10-CM | POA: Diagnosis not present

## 2018-12-17 DIAGNOSIS — R69 Illness, unspecified: Secondary | ICD-10-CM | POA: Diagnosis not present

## 2018-12-17 DIAGNOSIS — E871 Hypo-osmolality and hyponatremia: Secondary | ICD-10-CM | POA: Diagnosis not present

## 2018-12-17 DIAGNOSIS — D696 Thrombocytopenia, unspecified: Secondary | ICD-10-CM | POA: Diagnosis not present

## 2018-12-17 DIAGNOSIS — N184 Chronic kidney disease, stage 4 (severe): Secondary | ICD-10-CM | POA: Diagnosis not present

## 2018-12-17 DIAGNOSIS — I5030 Unspecified diastolic (congestive) heart failure: Secondary | ICD-10-CM | POA: Diagnosis not present

## 2018-12-17 DIAGNOSIS — E8809 Other disorders of plasma-protein metabolism, not elsewhere classified: Secondary | ICD-10-CM | POA: Diagnosis not present

## 2018-12-17 DIAGNOSIS — D509 Iron deficiency anemia, unspecified: Secondary | ICD-10-CM | POA: Diagnosis not present

## 2018-12-17 DIAGNOSIS — K7469 Other cirrhosis of liver: Secondary | ICD-10-CM | POA: Diagnosis not present

## 2018-12-17 DIAGNOSIS — E1122 Type 2 diabetes mellitus with diabetic chronic kidney disease: Secondary | ICD-10-CM | POA: Diagnosis not present

## 2018-12-17 DIAGNOSIS — E11649 Type 2 diabetes mellitus with hypoglycemia without coma: Secondary | ICD-10-CM | POA: Diagnosis not present

## 2018-12-17 DIAGNOSIS — R188 Other ascites: Secondary | ICD-10-CM | POA: Diagnosis not present

## 2018-12-17 DIAGNOSIS — L03818 Cellulitis of other sites: Secondary | ICD-10-CM | POA: Diagnosis not present

## 2018-12-17 DIAGNOSIS — K7581 Nonalcoholic steatohepatitis (NASH): Secondary | ICD-10-CM | POA: Diagnosis not present

## 2018-12-17 DIAGNOSIS — N179 Acute kidney failure, unspecified: Secondary | ICD-10-CM | POA: Diagnosis not present

## 2018-12-17 DIAGNOSIS — R0600 Dyspnea, unspecified: Secondary | ICD-10-CM | POA: Diagnosis not present

## 2018-12-18 DIAGNOSIS — N184 Chronic kidney disease, stage 4 (severe): Secondary | ICD-10-CM | POA: Diagnosis not present

## 2018-12-18 DIAGNOSIS — R188 Other ascites: Secondary | ICD-10-CM | POA: Diagnosis not present

## 2018-12-18 DIAGNOSIS — L03818 Cellulitis of other sites: Secondary | ICD-10-CM | POA: Diagnosis not present

## 2018-12-18 DIAGNOSIS — E8809 Other disorders of plasma-protein metabolism, not elsewhere classified: Secondary | ICD-10-CM | POA: Diagnosis not present

## 2018-12-18 DIAGNOSIS — D649 Anemia, unspecified: Secondary | ICD-10-CM | POA: Diagnosis not present

## 2018-12-18 DIAGNOSIS — D696 Thrombocytopenia, unspecified: Secondary | ICD-10-CM | POA: Diagnosis not present

## 2018-12-18 DIAGNOSIS — K766 Portal hypertension: Secondary | ICD-10-CM | POA: Diagnosis not present

## 2018-12-18 DIAGNOSIS — R0602 Shortness of breath: Secondary | ICD-10-CM | POA: Diagnosis not present

## 2018-12-18 DIAGNOSIS — I5030 Unspecified diastolic (congestive) heart failure: Secondary | ICD-10-CM | POA: Diagnosis not present

## 2018-12-18 DIAGNOSIS — K7581 Nonalcoholic steatohepatitis (NASH): Secondary | ICD-10-CM | POA: Diagnosis not present

## 2018-12-18 DIAGNOSIS — Z87442 Personal history of urinary calculi: Secondary | ICD-10-CM | POA: Diagnosis not present

## 2018-12-18 DIAGNOSIS — D509 Iron deficiency anemia, unspecified: Secondary | ICD-10-CM | POA: Diagnosis not present

## 2018-12-18 DIAGNOSIS — E11649 Type 2 diabetes mellitus with hypoglycemia without coma: Secondary | ICD-10-CM | POA: Diagnosis not present

## 2018-12-18 DIAGNOSIS — K449 Diaphragmatic hernia without obstruction or gangrene: Secondary | ICD-10-CM | POA: Diagnosis not present

## 2018-12-18 DIAGNOSIS — K7469 Other cirrhosis of liver: Secondary | ICD-10-CM | POA: Diagnosis not present

## 2018-12-18 DIAGNOSIS — N179 Acute kidney failure, unspecified: Secondary | ICD-10-CM | POA: Diagnosis not present

## 2018-12-18 DIAGNOSIS — I8500 Esophageal varices without bleeding: Secondary | ICD-10-CM | POA: Diagnosis not present

## 2018-12-18 DIAGNOSIS — E1122 Type 2 diabetes mellitus with diabetic chronic kidney disease: Secondary | ICD-10-CM | POA: Diagnosis not present

## 2018-12-18 DIAGNOSIS — Z794 Long term (current) use of insulin: Secondary | ICD-10-CM | POA: Diagnosis not present

## 2018-12-18 DIAGNOSIS — E611 Iron deficiency: Secondary | ICD-10-CM | POA: Diagnosis not present

## 2018-12-18 DIAGNOSIS — R0600 Dyspnea, unspecified: Secondary | ICD-10-CM | POA: Diagnosis not present

## 2018-12-19 DIAGNOSIS — I5030 Unspecified diastolic (congestive) heart failure: Secondary | ICD-10-CM | POA: Diagnosis not present

## 2018-12-19 DIAGNOSIS — E1122 Type 2 diabetes mellitus with diabetic chronic kidney disease: Secondary | ICD-10-CM | POA: Diagnosis not present

## 2018-12-19 DIAGNOSIS — Z794 Long term (current) use of insulin: Secondary | ICD-10-CM | POA: Diagnosis not present

## 2018-12-19 DIAGNOSIS — K7581 Nonalcoholic steatohepatitis (NASH): Secondary | ICD-10-CM | POA: Diagnosis not present

## 2018-12-19 DIAGNOSIS — K766 Portal hypertension: Secondary | ICD-10-CM | POA: Diagnosis not present

## 2018-12-19 DIAGNOSIS — N179 Acute kidney failure, unspecified: Secondary | ICD-10-CM | POA: Diagnosis not present

## 2018-12-19 DIAGNOSIS — Z87442 Personal history of urinary calculi: Secondary | ICD-10-CM | POA: Diagnosis not present

## 2018-12-19 DIAGNOSIS — E611 Iron deficiency: Secondary | ICD-10-CM | POA: Diagnosis not present

## 2018-12-19 DIAGNOSIS — R188 Other ascites: Secondary | ICD-10-CM | POA: Diagnosis not present

## 2018-12-19 DIAGNOSIS — L03818 Cellulitis of other sites: Secondary | ICD-10-CM | POA: Diagnosis not present

## 2018-12-19 DIAGNOSIS — R0600 Dyspnea, unspecified: Secondary | ICD-10-CM | POA: Diagnosis not present

## 2018-12-19 DIAGNOSIS — E8809 Other disorders of plasma-protein metabolism, not elsewhere classified: Secondary | ICD-10-CM | POA: Diagnosis not present

## 2018-12-19 DIAGNOSIS — N184 Chronic kidney disease, stage 4 (severe): Secondary | ICD-10-CM | POA: Diagnosis not present

## 2018-12-19 DIAGNOSIS — K7469 Other cirrhosis of liver: Secondary | ICD-10-CM | POA: Diagnosis not present

## 2018-12-19 DIAGNOSIS — D509 Iron deficiency anemia, unspecified: Secondary | ICD-10-CM | POA: Diagnosis not present

## 2018-12-19 DIAGNOSIS — D649 Anemia, unspecified: Secondary | ICD-10-CM | POA: Diagnosis not present

## 2018-12-19 DIAGNOSIS — D696 Thrombocytopenia, unspecified: Secondary | ICD-10-CM | POA: Diagnosis not present

## 2018-12-19 DIAGNOSIS — E11649 Type 2 diabetes mellitus with hypoglycemia without coma: Secondary | ICD-10-CM | POA: Diagnosis not present

## 2018-12-20 DIAGNOSIS — K449 Diaphragmatic hernia without obstruction or gangrene: Secondary | ICD-10-CM | POA: Diagnosis not present

## 2018-12-20 DIAGNOSIS — D638 Anemia in other chronic diseases classified elsewhere: Secondary | ICD-10-CM | POA: Diagnosis not present

## 2018-12-20 DIAGNOSIS — R140 Abdominal distension (gaseous): Secondary | ICD-10-CM | POA: Diagnosis not present

## 2018-12-20 DIAGNOSIS — N184 Chronic kidney disease, stage 4 (severe): Secondary | ICD-10-CM | POA: Diagnosis not present

## 2018-12-20 DIAGNOSIS — E611 Iron deficiency: Secondary | ICD-10-CM | POA: Diagnosis not present

## 2018-12-20 DIAGNOSIS — K7460 Unspecified cirrhosis of liver: Secondary | ICD-10-CM | POA: Diagnosis not present

## 2018-12-20 DIAGNOSIS — D696 Thrombocytopenia, unspecified: Secondary | ICD-10-CM | POA: Diagnosis not present

## 2018-12-20 DIAGNOSIS — K7581 Nonalcoholic steatohepatitis (NASH): Secondary | ICD-10-CM | POA: Diagnosis not present

## 2018-12-20 DIAGNOSIS — K7469 Other cirrhosis of liver: Secondary | ICD-10-CM | POA: Diagnosis not present

## 2018-12-20 DIAGNOSIS — R0602 Shortness of breath: Secondary | ICD-10-CM | POA: Diagnosis not present

## 2018-12-20 DIAGNOSIS — E8809 Other disorders of plasma-protein metabolism, not elsewhere classified: Secondary | ICD-10-CM | POA: Diagnosis not present

## 2018-12-20 DIAGNOSIS — Z87442 Personal history of urinary calculi: Secondary | ICD-10-CM | POA: Diagnosis not present

## 2018-12-20 DIAGNOSIS — R6881 Early satiety: Secondary | ICD-10-CM | POA: Diagnosis not present

## 2018-12-20 DIAGNOSIS — R188 Other ascites: Secondary | ICD-10-CM | POA: Diagnosis not present

## 2018-12-20 DIAGNOSIS — Z794 Long term (current) use of insulin: Secondary | ICD-10-CM | POA: Diagnosis not present

## 2018-12-20 DIAGNOSIS — R0600 Dyspnea, unspecified: Secondary | ICD-10-CM | POA: Diagnosis not present

## 2018-12-20 DIAGNOSIS — R531 Weakness: Secondary | ICD-10-CM | POA: Diagnosis not present

## 2018-12-20 DIAGNOSIS — N179 Acute kidney failure, unspecified: Secondary | ICD-10-CM | POA: Diagnosis not present

## 2018-12-20 DIAGNOSIS — N185 Chronic kidney disease, stage 5: Secondary | ICD-10-CM | POA: Diagnosis not present

## 2018-12-20 DIAGNOSIS — I5030 Unspecified diastolic (congestive) heart failure: Secondary | ICD-10-CM | POA: Diagnosis not present

## 2018-12-20 DIAGNOSIS — I8510 Secondary esophageal varices without bleeding: Secondary | ICD-10-CM | POA: Diagnosis not present

## 2018-12-20 DIAGNOSIS — E1122 Type 2 diabetes mellitus with diabetic chronic kidney disease: Secondary | ICD-10-CM | POA: Diagnosis not present

## 2018-12-20 DIAGNOSIS — I5032 Chronic diastolic (congestive) heart failure: Secondary | ICD-10-CM | POA: Diagnosis not present

## 2018-12-20 DIAGNOSIS — K7290 Hepatic failure, unspecified without coma: Secondary | ICD-10-CM | POA: Diagnosis not present

## 2018-12-20 DIAGNOSIS — D649 Anemia, unspecified: Secondary | ICD-10-CM | POA: Diagnosis not present

## 2018-12-20 DIAGNOSIS — N200 Calculus of kidney: Secondary | ICD-10-CM | POA: Diagnosis not present

## 2018-12-21 DIAGNOSIS — I8500 Esophageal varices without bleeding: Secondary | ICD-10-CM | POA: Diagnosis not present

## 2018-12-21 DIAGNOSIS — R188 Other ascites: Secondary | ICD-10-CM | POA: Diagnosis not present

## 2018-12-21 DIAGNOSIS — I130 Hypertensive heart and chronic kidney disease with heart failure and stage 1 through stage 4 chronic kidney disease, or unspecified chronic kidney disease: Secondary | ICD-10-CM | POA: Diagnosis not present

## 2018-12-21 DIAGNOSIS — Z794 Long term (current) use of insulin: Secondary | ICD-10-CM | POA: Diagnosis not present

## 2018-12-21 DIAGNOSIS — N179 Acute kidney failure, unspecified: Secondary | ICD-10-CM | POA: Diagnosis not present

## 2018-12-21 DIAGNOSIS — D696 Thrombocytopenia, unspecified: Secondary | ICD-10-CM | POA: Diagnosis not present

## 2018-12-21 DIAGNOSIS — K7581 Nonalcoholic steatohepatitis (NASH): Secondary | ICD-10-CM | POA: Diagnosis not present

## 2018-12-21 DIAGNOSIS — K7469 Other cirrhosis of liver: Secondary | ICD-10-CM | POA: Diagnosis not present

## 2018-12-21 DIAGNOSIS — N185 Chronic kidney disease, stage 5: Secondary | ICD-10-CM | POA: Diagnosis not present

## 2018-12-21 DIAGNOSIS — N200 Calculus of kidney: Secondary | ICD-10-CM | POA: Diagnosis not present

## 2018-12-21 DIAGNOSIS — E611 Iron deficiency: Secondary | ICD-10-CM | POA: Diagnosis not present

## 2018-12-21 DIAGNOSIS — N2889 Other specified disorders of kidney and ureter: Secondary | ICD-10-CM | POA: Diagnosis not present

## 2018-12-21 DIAGNOSIS — I509 Heart failure, unspecified: Secondary | ICD-10-CM | POA: Diagnosis not present

## 2018-12-21 DIAGNOSIS — E1122 Type 2 diabetes mellitus with diabetic chronic kidney disease: Secondary | ICD-10-CM | POA: Diagnosis not present

## 2018-12-21 DIAGNOSIS — K449 Diaphragmatic hernia without obstruction or gangrene: Secondary | ICD-10-CM | POA: Diagnosis not present

## 2018-12-21 DIAGNOSIS — I5032 Chronic diastolic (congestive) heart failure: Secondary | ICD-10-CM | POA: Diagnosis not present

## 2018-12-21 DIAGNOSIS — D649 Anemia, unspecified: Secondary | ICD-10-CM | POA: Diagnosis not present

## 2018-12-21 DIAGNOSIS — Z87442 Personal history of urinary calculi: Secondary | ICD-10-CM | POA: Diagnosis not present

## 2018-12-21 DIAGNOSIS — E8809 Other disorders of plasma-protein metabolism, not elsewhere classified: Secondary | ICD-10-CM | POA: Diagnosis not present

## 2018-12-21 DIAGNOSIS — I8510 Secondary esophageal varices without bleeding: Secondary | ICD-10-CM | POA: Diagnosis not present

## 2018-12-21 DIAGNOSIS — Z515 Encounter for palliative care: Secondary | ICD-10-CM | POA: Diagnosis not present

## 2018-12-21 DIAGNOSIS — I5030 Unspecified diastolic (congestive) heart failure: Secondary | ICD-10-CM | POA: Diagnosis not present

## 2018-12-21 DIAGNOSIS — D638 Anemia in other chronic diseases classified elsewhere: Secondary | ICD-10-CM | POA: Diagnosis not present

## 2018-12-21 DIAGNOSIS — N184 Chronic kidney disease, stage 4 (severe): Secondary | ICD-10-CM | POA: Diagnosis not present

## 2018-12-21 DIAGNOSIS — D6489 Other specified anemias: Secondary | ICD-10-CM | POA: Diagnosis not present

## 2018-12-22 ENCOUNTER — Other Ambulatory Visit: Payer: Self-pay | Admitting: Nurse Practitioner

## 2018-12-22 DIAGNOSIS — D539 Nutritional anemia, unspecified: Secondary | ICD-10-CM | POA: Diagnosis not present

## 2018-12-22 DIAGNOSIS — E785 Hyperlipidemia, unspecified: Secondary | ICD-10-CM | POA: Diagnosis not present

## 2018-12-22 DIAGNOSIS — K7460 Unspecified cirrhosis of liver: Secondary | ICD-10-CM | POA: Diagnosis not present

## 2018-12-22 DIAGNOSIS — N184 Chronic kidney disease, stage 4 (severe): Secondary | ICD-10-CM | POA: Diagnosis not present

## 2018-12-22 DIAGNOSIS — R69 Illness, unspecified: Secondary | ICD-10-CM | POA: Diagnosis not present

## 2018-12-22 DIAGNOSIS — I5030 Unspecified diastolic (congestive) heart failure: Secondary | ICD-10-CM | POA: Diagnosis not present

## 2018-12-22 DIAGNOSIS — Z85528 Personal history of other malignant neoplasm of kidney: Secondary | ICD-10-CM | POA: Diagnosis not present

## 2018-12-22 DIAGNOSIS — E1122 Type 2 diabetes mellitus with diabetic chronic kidney disease: Secondary | ICD-10-CM | POA: Diagnosis not present

## 2018-12-22 DIAGNOSIS — D696 Thrombocytopenia, unspecified: Secondary | ICD-10-CM | POA: Diagnosis not present

## 2018-12-22 DIAGNOSIS — I130 Hypertensive heart and chronic kidney disease with heart failure and stage 1 through stage 4 chronic kidney disease, or unspecified chronic kidney disease: Secondary | ICD-10-CM | POA: Diagnosis not present

## 2018-12-25 DIAGNOSIS — R739 Hyperglycemia, unspecified: Secondary | ICD-10-CM | POA: Diagnosis not present

## 2018-12-25 DIAGNOSIS — Z681 Body mass index (BMI) 19 or less, adult: Secondary | ICD-10-CM | POA: Diagnosis not present

## 2018-12-25 DIAGNOSIS — R5383 Other fatigue: Secondary | ICD-10-CM | POA: Diagnosis not present

## 2018-12-25 DIAGNOSIS — E782 Mixed hyperlipidemia: Secondary | ICD-10-CM | POA: Diagnosis not present

## 2018-12-25 DIAGNOSIS — D509 Iron deficiency anemia, unspecified: Secondary | ICD-10-CM | POA: Diagnosis not present

## 2018-12-25 DIAGNOSIS — E039 Hypothyroidism, unspecified: Secondary | ICD-10-CM | POA: Diagnosis not present

## 2018-12-25 DIAGNOSIS — N179 Acute kidney failure, unspecified: Secondary | ICD-10-CM | POA: Diagnosis not present

## 2018-12-25 DIAGNOSIS — N184 Chronic kidney disease, stage 4 (severe): Secondary | ICD-10-CM | POA: Diagnosis not present

## 2018-12-29 DIAGNOSIS — N186 End stage renal disease: Secondary | ICD-10-CM

## 2018-12-29 HISTORY — PX: ESOPHAGEAL VARICE LIGATION: SHX625

## 2018-12-29 HISTORY — DX: End stage renal disease: N18.6

## 2019-01-01 DIAGNOSIS — I130 Hypertensive heart and chronic kidney disease with heart failure and stage 1 through stage 4 chronic kidney disease, or unspecified chronic kidney disease: Secondary | ICD-10-CM | POA: Diagnosis not present

## 2019-01-01 DIAGNOSIS — Z85528 Personal history of other malignant neoplasm of kidney: Secondary | ICD-10-CM | POA: Diagnosis not present

## 2019-01-01 DIAGNOSIS — K7460 Unspecified cirrhosis of liver: Secondary | ICD-10-CM | POA: Diagnosis not present

## 2019-01-01 DIAGNOSIS — R69 Illness, unspecified: Secondary | ICD-10-CM | POA: Diagnosis not present

## 2019-01-01 DIAGNOSIS — N184 Chronic kidney disease, stage 4 (severe): Secondary | ICD-10-CM | POA: Diagnosis not present

## 2019-01-01 DIAGNOSIS — E1122 Type 2 diabetes mellitus with diabetic chronic kidney disease: Secondary | ICD-10-CM | POA: Diagnosis not present

## 2019-01-01 DIAGNOSIS — D539 Nutritional anemia, unspecified: Secondary | ICD-10-CM | POA: Diagnosis not present

## 2019-01-01 DIAGNOSIS — E785 Hyperlipidemia, unspecified: Secondary | ICD-10-CM | POA: Diagnosis not present

## 2019-01-01 DIAGNOSIS — I5030 Unspecified diastolic (congestive) heart failure: Secondary | ICD-10-CM | POA: Diagnosis not present

## 2019-01-01 DIAGNOSIS — D696 Thrombocytopenia, unspecified: Secondary | ICD-10-CM | POA: Diagnosis not present

## 2019-01-02 DIAGNOSIS — I8500 Esophageal varices without bleeding: Secondary | ICD-10-CM | POA: Diagnosis not present

## 2019-01-02 DIAGNOSIS — E1122 Type 2 diabetes mellitus with diabetic chronic kidney disease: Secondary | ICD-10-CM | POA: Diagnosis not present

## 2019-01-02 DIAGNOSIS — R195 Other fecal abnormalities: Secondary | ICD-10-CM | POA: Diagnosis not present

## 2019-01-02 DIAGNOSIS — K921 Melena: Secondary | ICD-10-CM | POA: Diagnosis not present

## 2019-01-02 DIAGNOSIS — R5383 Other fatigue: Secondary | ICD-10-CM | POA: Diagnosis not present

## 2019-01-02 DIAGNOSIS — D696 Thrombocytopenia, unspecified: Secondary | ICD-10-CM | POA: Diagnosis not present

## 2019-01-02 DIAGNOSIS — I5032 Chronic diastolic (congestive) heart failure: Secondary | ICD-10-CM | POA: Diagnosis not present

## 2019-01-02 DIAGNOSIS — K449 Diaphragmatic hernia without obstruction or gangrene: Secondary | ICD-10-CM | POA: Diagnosis not present

## 2019-01-02 DIAGNOSIS — N184 Chronic kidney disease, stage 4 (severe): Secondary | ICD-10-CM | POA: Diagnosis not present

## 2019-01-02 DIAGNOSIS — K7469 Other cirrhosis of liver: Secondary | ICD-10-CM | POA: Diagnosis not present

## 2019-01-02 DIAGNOSIS — I5030 Unspecified diastolic (congestive) heart failure: Secondary | ICD-10-CM | POA: Diagnosis not present

## 2019-01-02 DIAGNOSIS — K7460 Unspecified cirrhosis of liver: Secondary | ICD-10-CM | POA: Diagnosis not present

## 2019-01-02 DIAGNOSIS — N179 Acute kidney failure, unspecified: Secondary | ICD-10-CM | POA: Diagnosis not present

## 2019-01-02 DIAGNOSIS — I8511 Secondary esophageal varices with bleeding: Secondary | ICD-10-CM | POA: Diagnosis not present

## 2019-01-02 DIAGNOSIS — D62 Acute posthemorrhagic anemia: Secondary | ICD-10-CM | POA: Diagnosis not present

## 2019-01-02 DIAGNOSIS — I8510 Secondary esophageal varices without bleeding: Secondary | ICD-10-CM | POA: Diagnosis not present

## 2019-01-02 DIAGNOSIS — R188 Other ascites: Secondary | ICD-10-CM | POA: Diagnosis not present

## 2019-01-02 DIAGNOSIS — E11649 Type 2 diabetes mellitus with hypoglycemia without coma: Secondary | ICD-10-CM | POA: Diagnosis not present

## 2019-01-02 DIAGNOSIS — N189 Chronic kidney disease, unspecified: Secondary | ICD-10-CM | POA: Diagnosis not present

## 2019-01-02 DIAGNOSIS — N185 Chronic kidney disease, stage 5: Secondary | ICD-10-CM | POA: Diagnosis not present

## 2019-01-02 DIAGNOSIS — D500 Iron deficiency anemia secondary to blood loss (chronic): Secondary | ICD-10-CM | POA: Diagnosis not present

## 2019-01-02 DIAGNOSIS — Z9049 Acquired absence of other specified parts of digestive tract: Secondary | ICD-10-CM | POA: Diagnosis not present

## 2019-01-02 DIAGNOSIS — D509 Iron deficiency anemia, unspecified: Secondary | ICD-10-CM | POA: Diagnosis not present

## 2019-01-02 DIAGNOSIS — K7581 Nonalcoholic steatohepatitis (NASH): Secondary | ICD-10-CM | POA: Diagnosis not present

## 2019-01-02 DIAGNOSIS — L03818 Cellulitis of other sites: Secondary | ICD-10-CM | POA: Diagnosis not present

## 2019-01-02 DIAGNOSIS — N186 End stage renal disease: Secondary | ICD-10-CM | POA: Diagnosis not present

## 2019-01-02 DIAGNOSIS — K766 Portal hypertension: Secondary | ICD-10-CM | POA: Diagnosis not present

## 2019-01-02 DIAGNOSIS — Z4902 Encounter for fitting and adjustment of peritoneal dialysis catheter: Secondary | ICD-10-CM | POA: Diagnosis not present

## 2019-01-02 DIAGNOSIS — D649 Anemia, unspecified: Secondary | ICD-10-CM | POA: Diagnosis not present

## 2019-01-02 DIAGNOSIS — Z992 Dependence on renal dialysis: Secondary | ICD-10-CM | POA: Diagnosis not present

## 2019-01-02 DIAGNOSIS — K3189 Other diseases of stomach and duodenum: Secondary | ICD-10-CM | POA: Diagnosis not present

## 2019-01-02 DIAGNOSIS — D61818 Other pancytopenia: Secondary | ICD-10-CM | POA: Diagnosis not present

## 2019-01-02 DIAGNOSIS — I132 Hypertensive heart and chronic kidney disease with heart failure and with stage 5 chronic kidney disease, or end stage renal disease: Secondary | ICD-10-CM | POA: Diagnosis not present

## 2019-01-02 DIAGNOSIS — R69 Illness, unspecified: Secondary | ICD-10-CM | POA: Diagnosis not present

## 2019-01-02 DIAGNOSIS — Z681 Body mass index (BMI) 19 or less, adult: Secondary | ICD-10-CM | POA: Diagnosis not present

## 2019-01-02 DIAGNOSIS — R0600 Dyspnea, unspecified: Secondary | ICD-10-CM | POA: Diagnosis not present

## 2019-01-02 DIAGNOSIS — Z794 Long term (current) use of insulin: Secondary | ICD-10-CM | POA: Diagnosis not present

## 2019-01-03 DIAGNOSIS — R0600 Dyspnea, unspecified: Secondary | ICD-10-CM | POA: Diagnosis not present

## 2019-01-03 DIAGNOSIS — D696 Thrombocytopenia, unspecified: Secondary | ICD-10-CM | POA: Diagnosis not present

## 2019-01-03 DIAGNOSIS — K766 Portal hypertension: Secondary | ICD-10-CM | POA: Diagnosis not present

## 2019-01-03 DIAGNOSIS — R5383 Other fatigue: Secondary | ICD-10-CM | POA: Diagnosis not present

## 2019-01-03 DIAGNOSIS — L03818 Cellulitis of other sites: Secondary | ICD-10-CM | POA: Diagnosis not present

## 2019-01-03 DIAGNOSIS — R195 Other fecal abnormalities: Secondary | ICD-10-CM | POA: Diagnosis not present

## 2019-01-03 DIAGNOSIS — I8500 Esophageal varices without bleeding: Secondary | ICD-10-CM | POA: Diagnosis not present

## 2019-01-03 DIAGNOSIS — K921 Melena: Secondary | ICD-10-CM | POA: Diagnosis not present

## 2019-01-03 DIAGNOSIS — I8510 Secondary esophageal varices without bleeding: Secondary | ICD-10-CM | POA: Diagnosis not present

## 2019-01-03 DIAGNOSIS — D649 Anemia, unspecified: Secondary | ICD-10-CM | POA: Diagnosis not present

## 2019-01-03 DIAGNOSIS — R188 Other ascites: Secondary | ICD-10-CM | POA: Diagnosis not present

## 2019-01-03 DIAGNOSIS — K3189 Other diseases of stomach and duodenum: Secondary | ICD-10-CM | POA: Diagnosis not present

## 2019-01-03 DIAGNOSIS — E11649 Type 2 diabetes mellitus with hypoglycemia without coma: Secondary | ICD-10-CM | POA: Diagnosis not present

## 2019-01-03 DIAGNOSIS — N184 Chronic kidney disease, stage 4 (severe): Secondary | ICD-10-CM | POA: Diagnosis not present

## 2019-01-03 DIAGNOSIS — N179 Acute kidney failure, unspecified: Secondary | ICD-10-CM | POA: Diagnosis not present

## 2019-01-03 DIAGNOSIS — D509 Iron deficiency anemia, unspecified: Secondary | ICD-10-CM | POA: Diagnosis not present

## 2019-01-03 DIAGNOSIS — R69 Illness, unspecified: Secondary | ICD-10-CM | POA: Diagnosis not present

## 2019-01-03 DIAGNOSIS — K449 Diaphragmatic hernia without obstruction or gangrene: Secondary | ICD-10-CM | POA: Diagnosis not present

## 2019-01-03 DIAGNOSIS — K7469 Other cirrhosis of liver: Secondary | ICD-10-CM | POA: Diagnosis not present

## 2019-01-04 DIAGNOSIS — N185 Chronic kidney disease, stage 5: Secondary | ICD-10-CM | POA: Diagnosis not present

## 2019-01-04 DIAGNOSIS — Z4902 Encounter for fitting and adjustment of peritoneal dialysis catheter: Secondary | ICD-10-CM | POA: Diagnosis not present

## 2019-01-04 DIAGNOSIS — K766 Portal hypertension: Secondary | ICD-10-CM | POA: Diagnosis not present

## 2019-01-04 DIAGNOSIS — I8500 Esophageal varices without bleeding: Secondary | ICD-10-CM | POA: Diagnosis not present

## 2019-01-04 DIAGNOSIS — D500 Iron deficiency anemia secondary to blood loss (chronic): Secondary | ICD-10-CM | POA: Diagnosis not present

## 2019-01-04 DIAGNOSIS — Z9049 Acquired absence of other specified parts of digestive tract: Secondary | ICD-10-CM | POA: Diagnosis not present

## 2019-01-04 DIAGNOSIS — N189 Chronic kidney disease, unspecified: Secondary | ICD-10-CM | POA: Diagnosis not present

## 2019-01-04 DIAGNOSIS — K3189 Other diseases of stomach and duodenum: Secondary | ICD-10-CM | POA: Diagnosis not present

## 2019-01-04 DIAGNOSIS — K7581 Nonalcoholic steatohepatitis (NASH): Secondary | ICD-10-CM | POA: Diagnosis not present

## 2019-01-04 DIAGNOSIS — R188 Other ascites: Secondary | ICD-10-CM | POA: Diagnosis not present

## 2019-01-04 DIAGNOSIS — D62 Acute posthemorrhagic anemia: Secondary | ICD-10-CM | POA: Diagnosis not present

## 2019-01-04 DIAGNOSIS — K7469 Other cirrhosis of liver: Secondary | ICD-10-CM | POA: Diagnosis not present

## 2019-01-05 DIAGNOSIS — Z4902 Encounter for fitting and adjustment of peritoneal dialysis catheter: Secondary | ICD-10-CM | POA: Diagnosis not present

## 2019-01-05 DIAGNOSIS — N186 End stage renal disease: Secondary | ICD-10-CM | POA: Diagnosis not present

## 2019-01-07 DIAGNOSIS — R69 Illness, unspecified: Secondary | ICD-10-CM | POA: Diagnosis not present

## 2019-01-07 DIAGNOSIS — D696 Thrombocytopenia, unspecified: Secondary | ICD-10-CM | POA: Diagnosis not present

## 2019-01-07 DIAGNOSIS — E785 Hyperlipidemia, unspecified: Secondary | ICD-10-CM | POA: Diagnosis not present

## 2019-01-07 DIAGNOSIS — E1122 Type 2 diabetes mellitus with diabetic chronic kidney disease: Secondary | ICD-10-CM | POA: Diagnosis not present

## 2019-01-07 DIAGNOSIS — Z85528 Personal history of other malignant neoplasm of kidney: Secondary | ICD-10-CM | POA: Diagnosis not present

## 2019-01-07 DIAGNOSIS — I130 Hypertensive heart and chronic kidney disease with heart failure and stage 1 through stage 4 chronic kidney disease, or unspecified chronic kidney disease: Secondary | ICD-10-CM | POA: Diagnosis not present

## 2019-01-07 DIAGNOSIS — K7460 Unspecified cirrhosis of liver: Secondary | ICD-10-CM | POA: Diagnosis not present

## 2019-01-07 DIAGNOSIS — I5030 Unspecified diastolic (congestive) heart failure: Secondary | ICD-10-CM | POA: Diagnosis not present

## 2019-01-07 DIAGNOSIS — D539 Nutritional anemia, unspecified: Secondary | ICD-10-CM | POA: Diagnosis not present

## 2019-01-07 DIAGNOSIS — N184 Chronic kidney disease, stage 4 (severe): Secondary | ICD-10-CM | POA: Diagnosis not present

## 2019-01-10 ENCOUNTER — Emergency Department (HOSPITAL_COMMUNITY): Payer: Medicare HMO

## 2019-01-10 ENCOUNTER — Other Ambulatory Visit: Payer: Self-pay

## 2019-01-10 ENCOUNTER — Emergency Department (HOSPITAL_COMMUNITY)
Admission: EM | Admit: 2019-01-10 | Discharge: 2019-01-10 | Payer: Medicare HMO | Attending: Emergency Medicine | Admitting: Emergency Medicine

## 2019-01-10 ENCOUNTER — Encounter (HOSPITAL_COMMUNITY): Payer: Self-pay | Admitting: Emergency Medicine

## 2019-01-10 DIAGNOSIS — I120 Hypertensive chronic kidney disease with stage 5 chronic kidney disease or end stage renal disease: Secondary | ICD-10-CM | POA: Diagnosis not present

## 2019-01-10 DIAGNOSIS — R509 Fever, unspecified: Secondary | ICD-10-CM | POA: Diagnosis not present

## 2019-01-10 DIAGNOSIS — K7581 Nonalcoholic steatohepatitis (NASH): Secondary | ICD-10-CM | POA: Diagnosis not present

## 2019-01-10 DIAGNOSIS — L03116 Cellulitis of left lower limb: Secondary | ICD-10-CM | POA: Diagnosis not present

## 2019-01-10 DIAGNOSIS — R159 Full incontinence of feces: Secondary | ICD-10-CM | POA: Diagnosis not present

## 2019-01-10 DIAGNOSIS — N2581 Secondary hyperparathyroidism of renal origin: Secondary | ICD-10-CM | POA: Diagnosis not present

## 2019-01-10 DIAGNOSIS — R69 Illness, unspecified: Secondary | ICD-10-CM | POA: Diagnosis not present

## 2019-01-10 DIAGNOSIS — Z20828 Contact with and (suspected) exposure to other viral communicable diseases: Secondary | ICD-10-CM | POA: Diagnosis not present

## 2019-01-10 DIAGNOSIS — I2510 Atherosclerotic heart disease of native coronary artery without angina pectoris: Secondary | ICD-10-CM | POA: Diagnosis not present

## 2019-01-10 DIAGNOSIS — N185 Chronic kidney disease, stage 5: Secondary | ICD-10-CM | POA: Diagnosis not present

## 2019-01-10 DIAGNOSIS — L03818 Cellulitis of other sites: Secondary | ICD-10-CM | POA: Diagnosis not present

## 2019-01-10 DIAGNOSIS — Z992 Dependence on renal dialysis: Secondary | ICD-10-CM | POA: Diagnosis not present

## 2019-01-10 DIAGNOSIS — Z794 Long term (current) use of insulin: Secondary | ICD-10-CM | POA: Insufficient documentation

## 2019-01-10 DIAGNOSIS — N186 End stage renal disease: Secondary | ICD-10-CM | POA: Insufficient documentation

## 2019-01-10 DIAGNOSIS — I8500 Esophageal varices without bleeding: Secondary | ICD-10-CM | POA: Diagnosis not present

## 2019-01-10 DIAGNOSIS — R109 Unspecified abdominal pain: Secondary | ICD-10-CM | POA: Diagnosis not present

## 2019-01-10 DIAGNOSIS — R1110 Vomiting, unspecified: Secondary | ICD-10-CM | POA: Diagnosis not present

## 2019-01-10 DIAGNOSIS — R195 Other fecal abnormalities: Secondary | ICD-10-CM | POA: Insufficient documentation

## 2019-01-10 DIAGNOSIS — K920 Hematemesis: Secondary | ICD-10-CM | POA: Diagnosis not present

## 2019-01-10 DIAGNOSIS — Z87891 Personal history of nicotine dependence: Secondary | ICD-10-CM | POA: Diagnosis not present

## 2019-01-10 DIAGNOSIS — Z79899 Other long term (current) drug therapy: Secondary | ICD-10-CM | POA: Diagnosis not present

## 2019-01-10 DIAGNOSIS — R112 Nausea with vomiting, unspecified: Secondary | ICD-10-CM | POA: Diagnosis not present

## 2019-01-10 DIAGNOSIS — E872 Acidosis: Secondary | ICD-10-CM | POA: Diagnosis not present

## 2019-01-10 DIAGNOSIS — K921 Melena: Secondary | ICD-10-CM | POA: Diagnosis not present

## 2019-01-10 DIAGNOSIS — D649 Anemia, unspecified: Secondary | ICD-10-CM | POA: Diagnosis not present

## 2019-01-10 DIAGNOSIS — N200 Calculus of kidney: Secondary | ICD-10-CM | POA: Diagnosis not present

## 2019-01-10 DIAGNOSIS — E876 Hypokalemia: Secondary | ICD-10-CM | POA: Diagnosis not present

## 2019-01-10 DIAGNOSIS — D631 Anemia in chronic kidney disease: Secondary | ICD-10-CM | POA: Diagnosis not present

## 2019-01-10 DIAGNOSIS — K766 Portal hypertension: Secondary | ICD-10-CM | POA: Diagnosis not present

## 2019-01-10 DIAGNOSIS — Z515 Encounter for palliative care: Secondary | ICD-10-CM | POA: Diagnosis not present

## 2019-01-10 DIAGNOSIS — R1084 Generalized abdominal pain: Secondary | ICD-10-CM | POA: Diagnosis not present

## 2019-01-10 DIAGNOSIS — E1165 Type 2 diabetes mellitus with hyperglycemia: Secondary | ICD-10-CM | POA: Diagnosis not present

## 2019-01-10 DIAGNOSIS — R140 Abdominal distension (gaseous): Secondary | ICD-10-CM | POA: Diagnosis not present

## 2019-01-10 DIAGNOSIS — I1311 Hypertensive heart and chronic kidney disease without heart failure, with stage 5 chronic kidney disease, or end stage renal disease: Secondary | ICD-10-CM | POA: Insufficient documentation

## 2019-01-10 DIAGNOSIS — K7460 Unspecified cirrhosis of liver: Secondary | ICD-10-CM | POA: Diagnosis not present

## 2019-01-10 DIAGNOSIS — I132 Hypertensive heart and chronic kidney disease with heart failure and with stage 5 chronic kidney disease, or end stage renal disease: Secondary | ICD-10-CM | POA: Diagnosis not present

## 2019-01-10 DIAGNOSIS — R110 Nausea: Secondary | ICD-10-CM | POA: Diagnosis not present

## 2019-01-10 DIAGNOSIS — D62 Acute posthemorrhagic anemia: Secondary | ICD-10-CM | POA: Diagnosis not present

## 2019-01-10 DIAGNOSIS — N179 Acute kidney failure, unspecified: Secondary | ICD-10-CM | POA: Diagnosis not present

## 2019-01-10 DIAGNOSIS — E1122 Type 2 diabetes mellitus with diabetic chronic kidney disease: Secondary | ICD-10-CM | POA: Insufficient documentation

## 2019-01-10 DIAGNOSIS — I509 Heart failure, unspecified: Secondary | ICD-10-CM | POA: Diagnosis not present

## 2019-01-10 DIAGNOSIS — G4700 Insomnia, unspecified: Secondary | ICD-10-CM | POA: Diagnosis not present

## 2019-01-10 DIAGNOSIS — E11649 Type 2 diabetes mellitus with hypoglycemia without coma: Secondary | ICD-10-CM | POA: Diagnosis not present

## 2019-01-10 DIAGNOSIS — R578 Other shock: Secondary | ICD-10-CM | POA: Diagnosis not present

## 2019-01-10 DIAGNOSIS — I4891 Unspecified atrial fibrillation: Secondary | ICD-10-CM | POA: Diagnosis not present

## 2019-01-10 DIAGNOSIS — R0600 Dyspnea, unspecified: Secondary | ICD-10-CM | POA: Diagnosis not present

## 2019-01-10 DIAGNOSIS — Z9689 Presence of other specified functional implants: Secondary | ICD-10-CM | POA: Diagnosis not present

## 2019-01-10 DIAGNOSIS — R188 Other ascites: Secondary | ICD-10-CM | POA: Diagnosis not present

## 2019-01-10 DIAGNOSIS — I214 Non-ST elevation (NSTEMI) myocardial infarction: Secondary | ICD-10-CM | POA: Diagnosis not present

## 2019-01-10 DIAGNOSIS — I8511 Secondary esophageal varices with bleeding: Secondary | ICD-10-CM | POA: Diagnosis not present

## 2019-01-10 DIAGNOSIS — K922 Gastrointestinal hemorrhage, unspecified: Secondary | ICD-10-CM | POA: Diagnosis not present

## 2019-01-10 DIAGNOSIS — I130 Hypertensive heart and chronic kidney disease with heart failure and stage 1 through stage 4 chronic kidney disease, or unspecified chronic kidney disease: Secondary | ICD-10-CM | POA: Diagnosis not present

## 2019-01-10 DIAGNOSIS — N184 Chronic kidney disease, stage 4 (severe): Secondary | ICD-10-CM | POA: Diagnosis not present

## 2019-01-10 DIAGNOSIS — R142 Eructation: Secondary | ICD-10-CM | POA: Diagnosis not present

## 2019-01-10 DIAGNOSIS — D696 Thrombocytopenia, unspecified: Secondary | ICD-10-CM | POA: Diagnosis not present

## 2019-01-10 DIAGNOSIS — K3189 Other diseases of stomach and duodenum: Secondary | ICD-10-CM | POA: Diagnosis not present

## 2019-01-10 DIAGNOSIS — R918 Other nonspecific abnormal finding of lung field: Secondary | ICD-10-CM | POA: Diagnosis not present

## 2019-01-10 DIAGNOSIS — R5383 Other fatigue: Secondary | ICD-10-CM | POA: Diagnosis not present

## 2019-01-10 DIAGNOSIS — R58 Hemorrhage, not elsewhere classified: Secondary | ICD-10-CM | POA: Diagnosis not present

## 2019-01-10 DIAGNOSIS — R579 Shock, unspecified: Secondary | ICD-10-CM | POA: Diagnosis not present

## 2019-01-10 DIAGNOSIS — I959 Hypotension, unspecified: Secondary | ICD-10-CM | POA: Diagnosis not present

## 2019-01-10 DIAGNOSIS — K2210 Ulcer of esophagus without bleeding: Secondary | ICD-10-CM | POA: Diagnosis not present

## 2019-01-10 DIAGNOSIS — Z66 Do not resuscitate: Secondary | ICD-10-CM | POA: Diagnosis not present

## 2019-01-10 DIAGNOSIS — D509 Iron deficiency anemia, unspecified: Secondary | ICD-10-CM | POA: Diagnosis not present

## 2019-01-10 DIAGNOSIS — E8809 Other disorders of plasma-protein metabolism, not elsewhere classified: Secondary | ICD-10-CM | POA: Diagnosis not present

## 2019-01-10 DIAGNOSIS — R600 Localized edema: Secondary | ICD-10-CM | POA: Diagnosis not present

## 2019-01-10 DIAGNOSIS — I8501 Esophageal varices with bleeding: Secondary | ICD-10-CM | POA: Diagnosis not present

## 2019-01-10 DIAGNOSIS — R404 Transient alteration of awareness: Secondary | ICD-10-CM | POA: Diagnosis not present

## 2019-01-10 DIAGNOSIS — R42 Dizziness and giddiness: Secondary | ICD-10-CM | POA: Diagnosis not present

## 2019-01-10 DIAGNOSIS — K7469 Other cirrhosis of liver: Secondary | ICD-10-CM | POA: Diagnosis not present

## 2019-01-10 HISTORY — DX: Gastroduodenitis, unspecified, without bleeding: K29.90

## 2019-01-10 HISTORY — DX: Chronic kidney disease, unspecified: N18.9

## 2019-01-10 HISTORY — DX: Esophageal varices without bleeding: I85.00

## 2019-01-10 HISTORY — DX: Calculus of kidney: N20.0

## 2019-01-10 HISTORY — DX: Other ascites: R18.8

## 2019-01-10 HISTORY — DX: Melena: K92.1

## 2019-01-10 HISTORY — DX: Unspecified cirrhosis of liver: K74.60

## 2019-01-10 HISTORY — DX: Malignant neoplasm of unspecified kidney, except renal pelvis: C64.9

## 2019-01-10 HISTORY — DX: Portal hypertension: K76.6

## 2019-01-10 HISTORY — DX: Diaphragmatic hernia without obstruction or gangrene: K44.9

## 2019-01-10 HISTORY — DX: Anemia, unspecified: D64.9

## 2019-01-10 LAB — CBC WITH DIFFERENTIAL/PLATELET
Abs Immature Granulocytes: 0.08 10*3/uL — ABNORMAL HIGH (ref 0.00–0.07)
Basophils Absolute: 0 10*3/uL (ref 0.0–0.1)
Basophils Relative: 0 %
Eosinophils Absolute: 0.5 10*3/uL (ref 0.0–0.5)
Eosinophils Relative: 7 %
HCT: 15.3 % — ABNORMAL LOW (ref 36.0–46.0)
Hemoglobin: 4.4 g/dL — CL (ref 12.0–15.0)
Immature Granulocytes: 1 %
Lymphocytes Relative: 31 %
Lymphs Abs: 2.4 10*3/uL (ref 0.7–4.0)
MCH: 28.6 pg (ref 26.0–34.0)
MCHC: 28.8 g/dL — ABNORMAL LOW (ref 30.0–36.0)
MCV: 99.4 fL (ref 80.0–100.0)
Monocytes Absolute: 1.7 10*3/uL — ABNORMAL HIGH (ref 0.1–1.0)
Monocytes Relative: 23 %
Neutro Abs: 2.9 10*3/uL (ref 1.7–7.7)
Neutrophils Relative %: 38 %
Platelets: 131 10*3/uL — ABNORMAL LOW (ref 150–400)
RBC: 1.54 MIL/uL — ABNORMAL LOW (ref 3.87–5.11)
RDW: 16.7 % — ABNORMAL HIGH (ref 11.5–15.5)
WBC: 7.5 10*3/uL (ref 4.0–10.5)
nRBC: 0 % (ref 0.0–0.2)

## 2019-01-10 LAB — POC OCCULT BLOOD, ED: Fecal Occult Bld: POSITIVE — AB

## 2019-01-10 LAB — PREPARE RBC (CROSSMATCH)

## 2019-01-10 LAB — AMMONIA: Ammonia: 32 umol/L (ref 9–35)

## 2019-01-10 MED ORDER — SODIUM CHLORIDE 0.9 % IV BOLUS
1000.0000 mL | Freq: Once | INTRAVENOUS | Status: AC
  Administered 2019-01-10: 1000 mL via INTRAVENOUS

## 2019-01-10 MED ORDER — PANTOPRAZOLE SODIUM 40 MG IV SOLR
40.00 | INTRAVENOUS | Status: DC
Start: 2019-01-11 — End: 2019-01-10

## 2019-01-10 MED ORDER — GENERIC EXTERNAL MEDICATION
1.00 | Status: DC
Start: 2019-01-10 — End: 2019-01-10

## 2019-01-10 MED ORDER — IPRATROPIUM-ALBUTEROL 0.5-2.5 (3) MG/3ML IN SOLN
3.00 | RESPIRATORY_TRACT | Status: DC
Start: ? — End: 2019-01-10

## 2019-01-10 MED ORDER — GENERIC EXTERNAL MEDICATION
1.00 | Status: DC
Start: 2019-01-12 — End: 2019-01-10

## 2019-01-10 MED ORDER — MUPIROCIN 2 % EX OINT
TOPICAL_OINTMENT | CUTANEOUS | Status: DC
Start: 2019-01-11 — End: 2019-01-10

## 2019-01-10 MED ORDER — SODIUM CHLORIDE 0.9 % IV SOLN
80.0000 mg | Freq: Once | INTRAVENOUS | Status: AC
  Administered 2019-01-10: 80 mg via INTRAVENOUS
  Filled 2019-01-10: qty 80

## 2019-01-10 MED ORDER — GENERIC EXTERNAL MEDICATION
Status: DC
Start: ? — End: 2019-01-10

## 2019-01-10 MED ORDER — SODIUM CHLORIDE 0.9 % IV SOLN
10.0000 mL/h | Freq: Once | INTRAVENOUS | Status: AC
  Administered 2019-01-10: 10 mL/h via INTRAVENOUS

## 2019-01-10 MED ORDER — SODIUM CHLORIDE 0.9 % IV SOLN
8.0000 mg/h | INTRAVENOUS | Status: DC
  Filled 2019-01-10 (×4): qty 80

## 2019-01-10 MED ORDER — GENERIC EXTERNAL MEDICATION
50.00 | Status: DC
Start: ? — End: 2019-01-10

## 2019-01-10 MED ORDER — SODIUM CHLORIDE 0.9 % IV SOLN
INTRAVENOUS | Status: DC
  Administered 2019-01-10: 07:00:00 via INTRAVENOUS

## 2019-01-10 MED ORDER — SODIUM CHLORIDE 0.9 % IV SOLN
50.0000 ug/h | INTRAVENOUS | Status: DC
  Administered 2019-01-10: 50 ug/h via INTRAVENOUS
  Filled 2019-01-10 (×4): qty 1

## 2019-01-10 NOTE — ED Notes (Signed)
Report given to Theadora Rama, Therapist, sports at [facility].

## 2019-01-10 NOTE — ED Notes (Signed)
CRITICAL VALUE ALERT  Critical Value:  Hg 4.4  Date & Time Notied:  4739 01/10/2019  Provider Notified: Dr. Thurnell Garbe   Orders Received/Actions taken: Blood

## 2019-01-10 NOTE — ED Notes (Signed)
[employer] back and requested [employer] [facility] per charge.  They will call back with an ETA.

## 2019-01-10 NOTE — ED Notes (Signed)
Pt had recent blood transfusions and said yes to getting emergency release blood. Gerda Diss RN verified

## 2019-01-10 NOTE — ED Triage Notes (Signed)
PT brought in from home this am by [facility] Per EMS daughter of pt [name] [id number]) reported pt started vomiting bright red blood around 0630 this am. PT was recently discharge from [facility] where she had an endo procedure done and hx of GI bleeding and recent blood transfusion with last known hgb at [facility] of 7 at discharge. PT arousal to verbal stimuli upon arrival and had two IVs started with 500 NS bolus going. Copious amounts of bright red blood and clots noted on pt from vomiting in route. 2 units of emergency release blood obtained from Lab upon arrival and initiated with NS on pressure bags.

## 2019-01-10 NOTE — ED Notes (Signed)
3rd Unit of blood complete.

## 2019-01-10 NOTE — ED Notes (Signed)
Xray disk obtained for transport.

## 2019-01-10 NOTE — ED Notes (Signed)
Transferred via [facility].

## 2019-01-10 NOTE — ED Provider Notes (Signed)
[facility] EMERGENCY DEPARTMENT Provider Note   CSN: [id number] Arrival date & time: 01/10/19  0705    History   Chief Complaint Chief Complaint  Patient presents with   GI Bleeding    HPI [name] is a 78 y.o. female.     The history is provided by the patient, the EMS personnel and a relative. The history is limited by the condition of the patient (acuity of condition).  Pt was seen at 0705. Per EMS and pt's family report: Pt's daughter states pt began vomiting "bright red blood with clots" at 0630 this morning PTA. EMS states pt vomited again en route, SBP "50's." Pt was d/c from [facility] 2 days ago (Dr. 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 During that admission: Pt received PRBC's transfusion with Hgb improving from 5 to 7, Cr 4-5 from baseline 2-2.2.  Pt's SBP baseline apparently 80-100's.  Pt arrives to the ED awake/alert, moaning and states she is "cold." No reported fevers, cough, known COVID+ exposure.     Past Medical History:  Diagnosis Date   10 year risk of MI or stroke 7.5% or greater    Anemia    Ascites    Bursitis    CKD (chronic kidney disease)    Diabetes mellitus ([place])    Esophageal varices ([place])    ESRD (end stage renal disease) ([place]) 12/2018   to start dialysis   FHx: migraine headaches    Gastritis and duodenitis    GERD (gastroesophageal reflux disease)    Hiatal hernia    Hypercholesterolemia    Hypertension    Kidney stone    Liver cirrhosis ([place])    Melena    Portal hypertension ([place])    Renal cell carcinoma ([place])    Renal lithiasis    Takotsubo cardiomyopathy 06/2013    Patient Active Problem List   Diagnosis Date Noted   Abnormal thyroid blood test 11/07/2018   Uncontrolled type 2 diabetes mellitus with  hyperglycemia ([place]) 07/25/2018   Hyperthyroidism 07/05/2018   Melena 10/08/2013   Cardiomyopathy ([place]) 07/12/2013   Physical deconditioning 07/06/2013   NSTEMI (non-ST elevated myocardial infarction) ([place]) 07/02/2013   Community acquired pneumonia 07/02/2013   Sepsis(995.91) 07/02/2013   Acute respiratory failure ([place]) 32/20/2542   Acute systolic CHF (congestive heart failure) ([place]) 07/02/2013   10 year risk of MI or stroke 7.5% or greater    HEMOCCULT POSITIVE STOOL 05/15/2009   ANEMIA, IRON DEFICIENCY, HX OF 05/15/2009   DM 05/14/2009   HYPERCHOLESTEROLEMIA 05/14/2009   MIGRAINE HEADACHE 05/14/2009   HYPERTENSION 05/14/2009   GERD 05/14/2009   CHOLELITHIASIS 05/14/2009   NEPHROLITHIASIS 05/14/2009   BURSITIS 05/14/2009    Past Surgical History:  Procedure Laterality Date   CHOLECYSTECTOMY     COLONOSCOPY  06/15/2005   RMR: Minimal internal hemorrhoids. Otherwise normal rectum/Normal colon   COLONOSCOPY    12/14/2001   RMR: Internal hemorrhage, otherwise, normal rectum/ Polyps at 30 and 35 cm resected; the remainder of colonic mucosa appeared normal   COLONOSCOPY  05/2009   RMR: minimal hemorrhoids, sigmoid hyperplastic polyp   COLONOSCOPY N/A 10/24/2013   HCW:CBJSEGBT colonic polyps removed as described above/Colonic diverticulosis   ESOPHAGEAL VARICE LIGATION  12/2018   [facility]   ESOPHAGOGASTRODUODENOSCOPY  06/12/2007   DVV:OHYWVP esophagus/patulous EG junction, moderate sized  hiatal hernia, otherwise  normal stomach, D1, D2   ESOPHAGOGASTRODUODENOSCOPY N/A 10/24/2013   RMR: Small hiatal hernia; otherwise, normal EGD   HIP SURGERY     right   LEFT HEART CATHETERIZATION WITH CORONARY ANGIOGRAM N/A 07/02/2013   Procedure: LEFT HEART CATHETERIZATION WITH CORONARY ANGIOGRAM;  Surgeon: Josue Hector, MD;  Location: [facility] CATH LAB;  Service: Cardiovascular;  Laterality: N/A;   PARTIAL HYSTERECTOMY     TENCKHOFF CATHETER INSERTION       OB  History   No obstetric history on file.      Home Medications    Prior to Admission medications   Medication Sig Start Date End Date Taking? Authorizing Provider  buPROPion (WELLBUTRIN SR) 150 MG 12 hr tablet Take 150 mg by mouth daily.    [provider]  carvedilol (COREG) 6.25 MG tablet TAKE ONE TABLET BY MOUTH TWICE A DAY WITH A MEAL 12/22/18   Burtis Junes, NP  cholecalciferol (VITAMIN D) 1000 UNITS tablet Take 2,000 Units by mouth daily.    [provider]  FLUoxetine (PROZAC) 40 MG capsule Take 40 mg by mouth daily.    [provider]  furosemide (LASIX) 20 MG tablet TAKE ONE (1) TABLET EACH DAY 09/19/14   Sherren Mocha, MD  insulin degludec (TRESIBA) 100 UNIT/ML SOPN FlexTouch Pen Inject 0.18 mLs (18 Units total) into the skin daily with breakfast. 08/08/18   Nida, Marella Chimes, MD  Insulin Pen Needle (BD PEN NEEDLE NANO U/F) 32G X 4 MM MISC 1 each by Does not apply route 4 (four) times daily. 07/25/18   Cassandria Anger, MD  Multiple Vitamins-Minerals (PRESERVISION AREDS PO) Take by mouth.    [provider]  omeprazole (PRILOSEC) 20 MG capsule Take 20 mg by mouth daily.     [provider]  ondansetron (ZOFRAN ODT) 4 MG disintegrating tablet Take 1 tablet (4 mg total) by mouth every 8 (eight) hours as needed for nausea. 01/21/18   Tanna Furry, MD  Probiotic Product ([place]) Take 1 tablet by mouth daily.    [provider]  traMADol (ULTRAM) 50 MG tablet Take 50 mg by mouth every 6 (six) hours as needed.    [provider]  traZODone (DESYREL) 50 MG tablet Take 50 mg by mouth at bedtime.     [provider]  Zinc 50 MG TABS Take by mouth as directed.    [provider]    Family History Family History  Problem Relation Age of Onset   Pancreatic cancer Father 50   Colon cancer Mother        late 32s    Social History Social History   Tobacco Use   Smoking  status: Former Smoker    Packs/day: 1.00    Years: 43.00    Pack years: 43.00    Types: Cigarettes    Start date: 08/31/1959    Last attempt to quit: 08/30/2002    Years since quitting: 16.3   Smokeless tobacco: Never Used   Tobacco comment: Quit x 10 years  Substance Use Topics   Alcohol use: No   Drug use: No     Allergies   Codeine   Review of Systems Review of Systems  Unable to perform ROS: Acuity of condition     Physical Exam Updated Vital Signs BP (!) 79/42    Pulse 87    Temp (!) 97.2 F (36.2 C)    Resp 18    Wt 52.2 kg  SpO2 100%    BMI 21.03 kg/m .  Patient Vitals for the past 24 hrs:  BP Temp Pulse Resp SpO2 Weight  01/10/19 0846 (!) 101/48 (!) 96.8 F (36 C) 96 20 99 % --  01/10/19 0830 -- (!) 96.8 F (36 C) -- 20 99 % --  01/10/19 0825 (!) 101/48 (!) 96.8 F (36 C) 90 16 100 % --  01/10/19 0820 (!) 101/49 (!) 96.8 F (36 C) 84 13 100 % --  01/10/19 0819 (!) 101/48 (!) 96.8 F (36 C) 90 20 99 % --  01/10/19 0815 90/61 (!) 96.6 F (35.9 C) 92 16 100 % --  01/10/19 0810 105/68 (!) 96.6 F (35.9 C) 99 (!) 24 100 % --  01/10/19 0805 (!) 99/58 (!) 96.6 F (35.9 C) 96 20 100 % --  01/10/19 0800 (!) 89/63 (!) 96.4 F (35.8 C) 92 (!) 22 96 % --  01/10/19 0755 (!) 104/54 (!) 96.6 F (35.9 C) 93 (!) 23 100 % --  01/10/19 0752 -- -- -- -- -- 52.2 kg  01/10/19 0750 (!) 101/56 (!) 96.8 F (36 C) 92 (!) 22 100 % --  01/10/19 0745 (!) 79/42 (!) 97.2 F (36.2 C) 87 18 100 % --  01/10/19 0740 (!) 73/41 (!) 97.3 F (36.3 C) 81 15 100 % --  01/10/19 0730 (!) 70/57 (!) 97 F (36.1 C) 85 17 100 % --  01/10/19 0725 (!) 50/41 (!) 95 F (35 C) 83 19 100 % --  01/10/19 0710 (!) 62/39 -- (!) 107 20 100 % --     Physical Exam 0710: Physical examination:  Nursing notes reviewed; Vital signs and O2 SAT reviewed;  Constitutional: Thin, frail. In no acute distress; Head:  Normocephalic, atraumatic; Eyes: EOMI, PERRL, No scleral icterus; ENMT: Mouth and pharynx  normal, Mucous membranes dry; Neck: Supple, Full range of motion; Cardiovascular: Tachycardic rate and rhythm, No gallop; Respiratory: Breath sounds clear & equal bilaterally, No wheezes.  Speaking full sentences with ease, Normal respiratory effort/excursion; Chest: Nontender, Movement normal; Abdomen: Soft, Nontender, Nondistended, Normal bowel sounds. Rectal exam performed w/permission of pt and ED RN chaperone present.  Anal tone normal.  Non-tender, soft brown stool in rectal vault, heme positive. No fissures, no external hemorrhoids, no palp masses.; Genitourinary: No CVA tenderness; Extremities: Peripheral pulses normal, No tenderness, No edema, No calf edema or asymmetry. Scattered ecchymosis to LE's.; Neuro: AA&Ox3, No facial droop.  Speech clear. Moves all extremities on stretcher spontaneously and to command without apparent gross focal motor deficits in extremities.; Skin: Color pale, Warm, Dry.    ED Treatments / Results  Labs (all labs ordered are listed, but only abnormal results are displayed)   EKG EKG Interpretation  Date/Time:  Wednesday Jan 10 2019 07:12:56 EDT Ventricular Rate:  102 PR Interval:    QRS Duration: 79 QT Interval:  398 QTC Calculation: 519 R Axis:   18 Text Interpretation:  Sinus tachycardia Ventricular premature complex Prolonged QT interval When compared with ECG of 01/21/2018 Rate faster Confirmed by Francine Graven [phone]) on 01/10/2019 7:55:18 AM   Radiology    Procedures Procedures (including critical care time)  Medications Ordered in ED Medications  0.9 %  sodium chloride infusion (has no administration in time range)  octreotide (SANDOSTATIN) 500 mcg in sodium chloride 0.9 % 250 mL (2 mcg/mL) infusion (has no administration in time range)  pantoprazole (PROTONIX) 80 mg in sodium chloride 0.9 % 100 mL IVPB (has no administration in time  range)  pantoprazole (PROTONIX) 80 mg in sodium chloride 0.9 % 250 mL (0.32 mg/mL) infusion (has no  administration in time range)  sodium chloride 0.9 % bolus 1,000 mL (has no administration in time range)  0.9 %  sodium chloride infusion (has no administration in time range)     Initial Impression / Assessment and Plan / ED Course  I have reviewed the triage vital signs and the nursing notes.  Pertinent labs & imaging results that were available during my care of the patient were reviewed by me and considered in my medical decision making (see chart for details).     MDM Reviewed: previous chart, nursing note and vitals Reviewed previous: labs and ECG Interpretation: labs, ECG and x-ray Total time providing critical care: 75-105 minutes. This excludes time spent performing separately reportable procedures and services. Consults: admitting MD   CRITICAL CARE Performed by: Francine Graven Total critical care time: 90 minutes Critical care time was exclusive of separately billable procedures and treating other patients. Critical care was necessary to treat or prevent imminent or life-threatening deterioration. Critical care was time spent personally by me on the following activities: development of treatment plan with patient and/or surrogate as well as nursing, discussions with consultants, evaluation of patient's response to treatment, examination of patient, obtaining history from patient or surrogate, ordering and performing treatments and interventions, ordering and review of laboratory studies, ordering and review of radiographic studies, pulse oximetry and re-evaluation of patient's condition.   Results for orders placed or performed during the hospital encounter of 01/10/19  CBC with Differential  Result Value Ref Range   WBC 7.5 4.0 - 10.5 K/uL   RBC 1.54 (L) 3.87 - 5.11 MIL/uL   Hemoglobin 4.4 (LL) 12.0 - 15.0 g/dL   HCT 15.3 (L) 36.0 - 46.0 %   MCV 99.4 80.0 - 100.0 fL   MCH 28.6 26.0 - 34.0 pg   MCHC 28.8 (L) 30.0 - 36.0 g/dL   RDW 16.7 (H) 11.5 - 15.5 %    Platelets 131 (L) 150 - 400 K/uL   nRBC 0.0 0.0 - 0.2 %   Neutrophils Relative % 38 %   Neutro Abs 2.9 1.7 - 7.7 K/uL   Lymphocytes Relative 31 %   Lymphs Abs 2.4 0.7 - 4.0 K/uL   Monocytes Relative 23 %   Monocytes Absolute 1.7 (H) 0.1 - 1.0 K/uL   Eosinophils Relative 7 %   Eosinophils Absolute 0.5 0.0 - 0.5 K/uL   Basophils Relative 0 %   Basophils Absolute 0.0 0.0 - 0.1 K/uL   Immature Granulocytes 1 %   Abs Immature Granulocytes 0.08 (H) 0.00 - 0.07 K/uL  POC occult blood, ED  Result Value Ref Range   Fecal Occult Bld POSITIVE (A) NEGATIVE  Type and screen [facility]  Result Value Ref Range   ABO/RH(D) PENDING    Antibody Screen PENDING    Sample Expiration 01/13/2019,2359    Unit Number [id number]    Blood Component Type RED CELLS,LR    Unit division 00    Status of Unit ISSUED    Unit tag comment EMERGENCY RELEASE    Transfusion Status      OK TO TRANSFUSE Performed at [facility], [address], [place], [place] [address]    Crossmatch Result PENDING    Unit Number [id number]    Blood Component Type RED CELLS,LR    Unit division 00    Status of Unit ISSUED    Unit tag  comment EMERGENCY RELEASE    Transfusion Status OK TO TRANSFUSE    Crossmatch Result PENDING    Unit Number [id number]    Blood Component Type RED CELLS,LR    Unit division 00    Status of Unit ISSUED    Unit tag comment EMERGENCY RELEASE    Transfusion Status OK TO TRANSFUSE    Crossmatch Result PENDING    Unit Number [id number]    Blood Component Type RED CELLS,LR    Unit division 00    Status of Unit ISSUED    Unit tag comment EMERGENCY RELEASE    Transfusion Status OK TO TRANSFUSE    Crossmatch Result PENDING   BPAM RBC  Result Value Ref Range   ISSUE DATE / TIME 937342876811    Blood Product Unit Number [id number]    Unit Type and Rh 9500    Blood Product Expiration Date 638453646803    ISSUE DATE / TIME [id number]    Blood Product Unit Number  [id number]    Unit Type and Rh 9500    Blood Product Expiration Date 888280034917    ISSUE DATE / TIME 915056979480    Blood Product Unit Number [id number]    Unit Type and Rh 9500    Blood Product Expiration Date 675449201007    ISSUE DATE / TIME 121975883254    Blood Product Unit Number [id number]    Unit Type and Rh 9500    Blood Product Expiration Date 768088110315    Dg Chest Port 1 View Result Date: 01/10/2019 CLINICAL DATA:  Nausea, vomiting. EXAM: PORTABLE CHEST 1 VIEW COMPARISON:  Radiographs of July 12, 2013. FINDINGS: The heart size and mediastinal contours are within normal limits. Both lungs are clear. No pneumothorax or pleural effusion is noted. The visualized skeletal structures are unremarkable. IMPRESSION: No active disease. Electronically Signed   By: Marijo Conception M.D.   On: 01/10/2019 07:53    ANITTA TENNY was evaluated in Emergency Department on 01/10/2019 for the symptoms described in the history of present illness. She was evaluated in the context of the global COVID-19 pandemic, which necessitated consideration that the patient might be at risk for infection with the SARS-CoV-2 virus that causes COVID-19. Institutional protocols and algorithms that pertain to the evaluation of patients at risk for COVID-19 are in a state of rapid change based on information released by regulatory bodies including the CDC and federal and state organizations. These policies and algorithms were followed during the patient's care in the ED.    0710: Pt awake/alert, tachycardic on arrival, SBP 60's. Denies any CP/SOB/abd pain complaints. Emergency release PRBC's x2 units ordered. IVF NS boluses ordered. IV octreotide, IV protonix bolus/gtt ordered.   0730:  Pt's daughter updated by ED RN: she is made aware of pt's critical condition.  SBP slowly improving.   9458:  T/C returned from [facility] Dr. Lennox Grumbles, case discussed, including:  HPI, pertinent PM/SHx, VS/PE, dx  testing, ED course and treatment:  States she knows pt from previous visit(s), agrees with ED treatment, requests if BUN 90< to dose DDAVP 0.3 mcg/kg. T/C transferred to EDP Dr. Cleda Mccreedy, case discussed, including:  HPI, pertinent PM/SHx, VS/PE, dx testing, ED course and treatment:  Agreeable to accept transfer to ED.   0745:  SBP continues to improve to pt's baseline. No urine output from Foley; IVF NS continues. Warming blanket placed.  0800:  Pt vomited large amount of dark clots. 1st and 2nd units emergency release PRBC's have been transfused, 3rd  and 4th units ordered.   1761:  No further vomiting, pt remains awake/alert, NAD, abd soft/NT, resps without distress. Holding own airway well. Pt is talking with daughter at bedside and doing Facetime with other family members via daughter's cellphone.   0825:  [facility] air-ground team at bedside, Games developer en route.   0840:  Small amount of cloudy yellow urine in foley tubing now. IVF NS boluses continue as well as PRBC's transfusion, IV octreotide and protonix gtts. SBP 101, HR 80-90's, Sats 100% R/A.   6073:  Air transport team at bedside. Family at bedside talking with pt; updated regarding plan of care. No further hematemesis, no stooling while in the ED. Pt NAD, resps easy, Sats 100% R/A. Airway clear. PRBC's transfused (4 units total); [facility] will transfuse further prn en route. Pt left the ED; stable.      Final Clinical Impressions(s) / ED Diagnoses   Final diagnoses:  None    ED Discharge Orders    None       Francine Graven, DO 01/15/19 7106

## 2019-01-10 NOTE — ED Notes (Signed)
Consulted [facility] for transport to [facility] ER to ER.  They will arrange transport and will have a truck in route.

## 2019-01-10 NOTE — ED Notes (Signed)
Not able to obtain urine (zreo output) and not able to obtain labs due to low blood volume.  Only able to obtain enough blood for CBC, MD aware.

## 2019-01-11 MED ORDER — DEXTROSE 10 % IV SOLN
125.00 | INTRAVENOUS | Status: DC
Start: ? — End: 2019-01-11

## 2019-01-11 MED ORDER — LIDOCAINE 4 % EX PTCH
1.00 | MEDICATED_PATCH | CUTANEOUS | Status: DC
Start: 2019-01-11 — End: 2019-01-11

## 2019-01-11 MED ORDER — GLUCOSE 40 % PO GEL
15.00 | ORAL | Status: DC
Start: ? — End: 2019-01-11

## 2019-01-11 MED ORDER — ONDANSETRON 4 MG PO TBDP
4.00 | ORAL_TABLET | ORAL | Status: DC
Start: ? — End: 2019-01-11

## 2019-01-11 MED ORDER — ACETAMINOPHEN 325 MG PO TABS
650.00 | ORAL_TABLET | ORAL | Status: DC
Start: ? — End: 2019-01-11

## 2019-01-11 MED ORDER — FLUOXETINE HCL 20 MG PO CAPS
40.00 | ORAL_CAPSULE | ORAL | Status: DC
Start: 2019-01-12 — End: 2019-01-11

## 2019-01-11 MED ORDER — GENERIC EXTERNAL MEDICATION
Status: DC
Start: ? — End: 2019-01-11

## 2019-01-11 MED ORDER — INSULIN LISPRO 100 UNIT/ML ~~LOC~~ SOLN
1.00 | SUBCUTANEOUS | Status: DC
Start: 2019-01-11 — End: 2019-01-11

## 2019-01-11 MED ORDER — BUPROPION HCL ER (SR) 100 MG PO TB12
100.00 | ORAL_TABLET | ORAL | Status: DC
Start: 2019-01-12 — End: 2019-01-11

## 2019-01-11 MED ORDER — GENERIC EXTERNAL MEDICATION
500.00 | Status: DC
Start: 2019-01-12 — End: 2019-01-11

## 2019-01-11 MED ORDER — SUCRALFATE 1 GM/10ML PO SUSP
1.00 | ORAL | Status: DC
Start: 2019-01-11 — End: 2019-01-11

## 2019-01-11 MED ORDER — TRAZODONE HCL 50 MG PO TABS
50.00 | ORAL_TABLET | ORAL | Status: DC
Start: 2019-01-11 — End: 2019-01-11

## 2019-01-11 MED ORDER — OXYCODONE HCL 5 MG PO TABS
5.00 | ORAL_TABLET | ORAL | Status: DC
Start: ? — End: 2019-01-11

## 2019-01-11 MED ORDER — SODIUM BICARBONATE 650 MG PO TABS
1300.00 | ORAL_TABLET | ORAL | Status: DC
Start: 2019-01-11 — End: 2019-01-11

## 2019-01-11 MED ORDER — ATORVASTATIN CALCIUM 10 MG PO TABS
20.00 | ORAL_TABLET | ORAL | Status: DC
Start: 2019-01-12 — End: 2019-01-11

## 2019-01-11 MED ORDER — MELATONIN 3 MG PO TABS
6.00 | ORAL_TABLET | ORAL | Status: DC
Start: 2019-01-11 — End: 2019-01-11

## 2019-01-12 LAB — BPAM RBC
Blood Product Expiration Date: 202005212359
Blood Product Expiration Date: 202005212359
Blood Product Expiration Date: 202006032359
Blood Product Expiration Date: 202006042359
ISSUE DATE / TIME: 202005130725
ISSUE DATE / TIME: 202005130725
ISSUE DATE / TIME: 202005130811
ISSUE DATE / TIME: 202005130811
Unit Type and Rh: 9500
Unit Type and Rh: 9500
Unit Type and Rh: 9500
Unit Type and Rh: 9500

## 2019-01-12 LAB — TYPE AND SCREEN
Unit division: 0
Unit division: 0
Unit division: 0
Unit division: 0

## 2019-01-19 DIAGNOSIS — E785 Hyperlipidemia, unspecified: Secondary | ICD-10-CM | POA: Diagnosis not present

## 2019-01-19 DIAGNOSIS — N184 Chronic kidney disease, stage 4 (severe): Secondary | ICD-10-CM | POA: Diagnosis not present

## 2019-01-19 DIAGNOSIS — D696 Thrombocytopenia, unspecified: Secondary | ICD-10-CM | POA: Diagnosis not present

## 2019-01-19 DIAGNOSIS — Z85528 Personal history of other malignant neoplasm of kidney: Secondary | ICD-10-CM | POA: Diagnosis not present

## 2019-01-19 DIAGNOSIS — I5030 Unspecified diastolic (congestive) heart failure: Secondary | ICD-10-CM | POA: Diagnosis not present

## 2019-01-19 DIAGNOSIS — I130 Hypertensive heart and chronic kidney disease with heart failure and stage 1 through stage 4 chronic kidney disease, or unspecified chronic kidney disease: Secondary | ICD-10-CM | POA: Diagnosis not present

## 2019-01-19 DIAGNOSIS — R69 Illness, unspecified: Secondary | ICD-10-CM | POA: Diagnosis not present

## 2019-01-19 DIAGNOSIS — K7460 Unspecified cirrhosis of liver: Secondary | ICD-10-CM | POA: Diagnosis not present

## 2019-01-19 DIAGNOSIS — E1122 Type 2 diabetes mellitus with diabetic chronic kidney disease: Secondary | ICD-10-CM | POA: Diagnosis not present

## 2019-01-19 DIAGNOSIS — D539 Nutritional anemia, unspecified: Secondary | ICD-10-CM | POA: Diagnosis not present

## 2019-01-20 DIAGNOSIS — N179 Acute kidney failure, unspecified: Secondary | ICD-10-CM | POA: Diagnosis not present

## 2019-01-20 DIAGNOSIS — N184 Chronic kidney disease, stage 4 (severe): Secondary | ICD-10-CM | POA: Diagnosis not present

## 2019-01-20 DIAGNOSIS — E782 Mixed hyperlipidemia: Secondary | ICD-10-CM | POA: Diagnosis not present

## 2019-01-20 DIAGNOSIS — D509 Iron deficiency anemia, unspecified: Secondary | ICD-10-CM | POA: Diagnosis not present

## 2019-01-20 DIAGNOSIS — Z681 Body mass index (BMI) 19 or less, adult: Secondary | ICD-10-CM | POA: Diagnosis not present

## 2019-01-22 DIAGNOSIS — Z992 Dependence on renal dialysis: Secondary | ICD-10-CM | POA: Diagnosis not present

## 2019-01-22 DIAGNOSIS — I120 Hypertensive chronic kidney disease with stage 5 chronic kidney disease or end stage renal disease: Secondary | ICD-10-CM | POA: Diagnosis not present

## 2019-01-22 DIAGNOSIS — C649 Malignant neoplasm of unspecified kidney, except renal pelvis: Secondary | ICD-10-CM | POA: Diagnosis not present

## 2019-01-22 DIAGNOSIS — E1122 Type 2 diabetes mellitus with diabetic chronic kidney disease: Secondary | ICD-10-CM | POA: Diagnosis not present

## 2019-01-22 DIAGNOSIS — N186 End stage renal disease: Secondary | ICD-10-CM | POA: Diagnosis not present

## 2019-01-22 DIAGNOSIS — N2581 Secondary hyperparathyroidism of renal origin: Secondary | ICD-10-CM | POA: Diagnosis not present

## 2019-01-22 DIAGNOSIS — D631 Anemia in chronic kidney disease: Secondary | ICD-10-CM | POA: Diagnosis not present

## 2019-01-22 DIAGNOSIS — Z418 Encounter for other procedures for purposes other than remedying health state: Secondary | ICD-10-CM | POA: Diagnosis not present

## 2019-01-23 DIAGNOSIS — R69 Illness, unspecified: Secondary | ICD-10-CM | POA: Diagnosis not present

## 2019-01-23 DIAGNOSIS — N184 Chronic kidney disease, stage 4 (severe): Secondary | ICD-10-CM | POA: Diagnosis not present

## 2019-01-23 DIAGNOSIS — I130 Hypertensive heart and chronic kidney disease with heart failure and stage 1 through stage 4 chronic kidney disease, or unspecified chronic kidney disease: Secondary | ICD-10-CM | POA: Diagnosis not present

## 2019-01-23 DIAGNOSIS — E1122 Type 2 diabetes mellitus with diabetic chronic kidney disease: Secondary | ICD-10-CM | POA: Diagnosis not present

## 2019-01-23 DIAGNOSIS — E785 Hyperlipidemia, unspecified: Secondary | ICD-10-CM | POA: Diagnosis not present

## 2019-01-23 DIAGNOSIS — D539 Nutritional anemia, unspecified: Secondary | ICD-10-CM | POA: Diagnosis not present

## 2019-01-23 DIAGNOSIS — I5030 Unspecified diastolic (congestive) heart failure: Secondary | ICD-10-CM | POA: Diagnosis not present

## 2019-01-23 DIAGNOSIS — Z85528 Personal history of other malignant neoplasm of kidney: Secondary | ICD-10-CM | POA: Diagnosis not present

## 2019-01-23 DIAGNOSIS — K7460 Unspecified cirrhosis of liver: Secondary | ICD-10-CM | POA: Diagnosis not present

## 2019-01-23 DIAGNOSIS — D696 Thrombocytopenia, unspecified: Secondary | ICD-10-CM | POA: Diagnosis not present

## 2019-01-25 DIAGNOSIS — N2581 Secondary hyperparathyroidism of renal origin: Secondary | ICD-10-CM | POA: Diagnosis not present

## 2019-01-25 DIAGNOSIS — E1165 Type 2 diabetes mellitus with hyperglycemia: Secondary | ICD-10-CM | POA: Diagnosis not present

## 2019-01-25 DIAGNOSIS — D631 Anemia in chronic kidney disease: Secondary | ICD-10-CM | POA: Diagnosis not present

## 2019-01-25 DIAGNOSIS — N186 End stage renal disease: Secondary | ICD-10-CM | POA: Diagnosis not present

## 2019-01-26 DIAGNOSIS — R69 Illness, unspecified: Secondary | ICD-10-CM | POA: Diagnosis not present

## 2019-01-26 DIAGNOSIS — N184 Chronic kidney disease, stage 4 (severe): Secondary | ICD-10-CM | POA: Diagnosis not present

## 2019-01-26 DIAGNOSIS — D539 Nutritional anemia, unspecified: Secondary | ICD-10-CM | POA: Diagnosis not present

## 2019-01-26 DIAGNOSIS — I5030 Unspecified diastolic (congestive) heart failure: Secondary | ICD-10-CM | POA: Diagnosis not present

## 2019-01-26 DIAGNOSIS — D696 Thrombocytopenia, unspecified: Secondary | ICD-10-CM | POA: Diagnosis not present

## 2019-01-26 DIAGNOSIS — K7460 Unspecified cirrhosis of liver: Secondary | ICD-10-CM | POA: Diagnosis not present

## 2019-01-26 DIAGNOSIS — E785 Hyperlipidemia, unspecified: Secondary | ICD-10-CM | POA: Diagnosis not present

## 2019-01-26 DIAGNOSIS — Z85528 Personal history of other malignant neoplasm of kidney: Secondary | ICD-10-CM | POA: Diagnosis not present

## 2019-01-26 DIAGNOSIS — E1122 Type 2 diabetes mellitus with diabetic chronic kidney disease: Secondary | ICD-10-CM | POA: Diagnosis not present

## 2019-01-26 DIAGNOSIS — I130 Hypertensive heart and chronic kidney disease with heart failure and stage 1 through stage 4 chronic kidney disease, or unspecified chronic kidney disease: Secondary | ICD-10-CM | POA: Diagnosis not present

## 2019-01-29 DIAGNOSIS — D631 Anemia in chronic kidney disease: Secondary | ICD-10-CM | POA: Diagnosis not present

## 2019-01-29 DIAGNOSIS — N186 End stage renal disease: Secondary | ICD-10-CM | POA: Diagnosis not present

## 2019-01-29 DIAGNOSIS — N2581 Secondary hyperparathyroidism of renal origin: Secondary | ICD-10-CM | POA: Diagnosis not present

## 2019-01-29 DIAGNOSIS — D509 Iron deficiency anemia, unspecified: Secondary | ICD-10-CM | POA: Diagnosis not present

## 2019-01-29 DIAGNOSIS — Z4932 Encounter for adequacy testing for peritoneal dialysis: Secondary | ICD-10-CM | POA: Diagnosis not present

## 2019-01-29 DIAGNOSIS — Z1159 Encounter for screening for other viral diseases: Secondary | ICD-10-CM | POA: Diagnosis not present

## 2019-01-30 DIAGNOSIS — K7460 Unspecified cirrhosis of liver: Secondary | ICD-10-CM | POA: Diagnosis not present

## 2019-01-30 DIAGNOSIS — N184 Chronic kidney disease, stage 4 (severe): Secondary | ICD-10-CM | POA: Diagnosis not present

## 2019-01-30 DIAGNOSIS — R69 Illness, unspecified: Secondary | ICD-10-CM | POA: Diagnosis not present

## 2019-01-30 DIAGNOSIS — E785 Hyperlipidemia, unspecified: Secondary | ICD-10-CM | POA: Diagnosis not present

## 2019-01-30 DIAGNOSIS — I130 Hypertensive heart and chronic kidney disease with heart failure and stage 1 through stage 4 chronic kidney disease, or unspecified chronic kidney disease: Secondary | ICD-10-CM | POA: Diagnosis not present

## 2019-01-30 DIAGNOSIS — I5030 Unspecified diastolic (congestive) heart failure: Secondary | ICD-10-CM | POA: Diagnosis not present

## 2019-01-30 DIAGNOSIS — D539 Nutritional anemia, unspecified: Secondary | ICD-10-CM | POA: Diagnosis not present

## 2019-01-30 DIAGNOSIS — Z85528 Personal history of other malignant neoplasm of kidney: Secondary | ICD-10-CM | POA: Diagnosis not present

## 2019-01-30 DIAGNOSIS — E1122 Type 2 diabetes mellitus with diabetic chronic kidney disease: Secondary | ICD-10-CM | POA: Diagnosis not present

## 2019-01-30 DIAGNOSIS — D696 Thrombocytopenia, unspecified: Secondary | ICD-10-CM | POA: Diagnosis not present

## 2019-02-01 DIAGNOSIS — N2581 Secondary hyperparathyroidism of renal origin: Secondary | ICD-10-CM | POA: Diagnosis not present

## 2019-02-01 DIAGNOSIS — D631 Anemia in chronic kidney disease: Secondary | ICD-10-CM | POA: Diagnosis not present

## 2019-02-01 DIAGNOSIS — D509 Iron deficiency anemia, unspecified: Secondary | ICD-10-CM | POA: Diagnosis not present

## 2019-02-01 DIAGNOSIS — N186 End stage renal disease: Secondary | ICD-10-CM | POA: Diagnosis not present

## 2019-02-02 DIAGNOSIS — K7460 Unspecified cirrhosis of liver: Secondary | ICD-10-CM | POA: Diagnosis not present

## 2019-02-02 DIAGNOSIS — E785 Hyperlipidemia, unspecified: Secondary | ICD-10-CM | POA: Diagnosis not present

## 2019-02-02 DIAGNOSIS — D539 Nutritional anemia, unspecified: Secondary | ICD-10-CM | POA: Diagnosis not present

## 2019-02-02 DIAGNOSIS — D696 Thrombocytopenia, unspecified: Secondary | ICD-10-CM | POA: Diagnosis not present

## 2019-02-02 DIAGNOSIS — Z85528 Personal history of other malignant neoplasm of kidney: Secondary | ICD-10-CM | POA: Diagnosis not present

## 2019-02-02 DIAGNOSIS — E1122 Type 2 diabetes mellitus with diabetic chronic kidney disease: Secondary | ICD-10-CM | POA: Diagnosis not present

## 2019-02-02 DIAGNOSIS — R69 Illness, unspecified: Secondary | ICD-10-CM | POA: Diagnosis not present

## 2019-02-02 DIAGNOSIS — N184 Chronic kidney disease, stage 4 (severe): Secondary | ICD-10-CM | POA: Diagnosis not present

## 2019-02-02 DIAGNOSIS — I130 Hypertensive heart and chronic kidney disease with heart failure and stage 1 through stage 4 chronic kidney disease, or unspecified chronic kidney disease: Secondary | ICD-10-CM | POA: Diagnosis not present

## 2019-02-02 DIAGNOSIS — I5030 Unspecified diastolic (congestive) heart failure: Secondary | ICD-10-CM | POA: Diagnosis not present

## 2019-02-05 DIAGNOSIS — D509 Iron deficiency anemia, unspecified: Secondary | ICD-10-CM | POA: Diagnosis not present

## 2019-02-05 DIAGNOSIS — D539 Nutritional anemia, unspecified: Secondary | ICD-10-CM | POA: Diagnosis not present

## 2019-02-05 DIAGNOSIS — R69 Illness, unspecified: Secondary | ICD-10-CM | POA: Diagnosis not present

## 2019-02-05 DIAGNOSIS — Z85528 Personal history of other malignant neoplasm of kidney: Secondary | ICD-10-CM | POA: Diagnosis not present

## 2019-02-05 DIAGNOSIS — E1122 Type 2 diabetes mellitus with diabetic chronic kidney disease: Secondary | ICD-10-CM | POA: Diagnosis not present

## 2019-02-05 DIAGNOSIS — N2581 Secondary hyperparathyroidism of renal origin: Secondary | ICD-10-CM | POA: Diagnosis not present

## 2019-02-05 DIAGNOSIS — E785 Hyperlipidemia, unspecified: Secondary | ICD-10-CM | POA: Diagnosis not present

## 2019-02-05 DIAGNOSIS — D631 Anemia in chronic kidney disease: Secondary | ICD-10-CM | POA: Diagnosis not present

## 2019-02-05 DIAGNOSIS — N184 Chronic kidney disease, stage 4 (severe): Secondary | ICD-10-CM | POA: Diagnosis not present

## 2019-02-05 DIAGNOSIS — I130 Hypertensive heart and chronic kidney disease with heart failure and stage 1 through stage 4 chronic kidney disease, or unspecified chronic kidney disease: Secondary | ICD-10-CM | POA: Diagnosis not present

## 2019-02-05 DIAGNOSIS — K7460 Unspecified cirrhosis of liver: Secondary | ICD-10-CM | POA: Diagnosis not present

## 2019-02-05 DIAGNOSIS — D696 Thrombocytopenia, unspecified: Secondary | ICD-10-CM | POA: Diagnosis not present

## 2019-02-05 DIAGNOSIS — I5030 Unspecified diastolic (congestive) heart failure: Secondary | ICD-10-CM | POA: Diagnosis not present

## 2019-02-05 DIAGNOSIS — N186 End stage renal disease: Secondary | ICD-10-CM | POA: Diagnosis not present

## 2019-02-06 DIAGNOSIS — D631 Anemia in chronic kidney disease: Secondary | ICD-10-CM | POA: Diagnosis not present

## 2019-02-06 DIAGNOSIS — N186 End stage renal disease: Secondary | ICD-10-CM | POA: Diagnosis not present

## 2019-02-06 DIAGNOSIS — R739 Hyperglycemia, unspecified: Secondary | ICD-10-CM | POA: Diagnosis not present

## 2019-02-06 DIAGNOSIS — N2581 Secondary hyperparathyroidism of renal origin: Secondary | ICD-10-CM | POA: Diagnosis not present

## 2019-02-06 DIAGNOSIS — E1165 Type 2 diabetes mellitus with hyperglycemia: Secondary | ICD-10-CM | POA: Diagnosis not present

## 2019-02-06 DIAGNOSIS — D509 Iron deficiency anemia, unspecified: Secondary | ICD-10-CM | POA: Diagnosis not present

## 2019-02-07 DIAGNOSIS — D631 Anemia in chronic kidney disease: Secondary | ICD-10-CM | POA: Diagnosis not present

## 2019-02-07 DIAGNOSIS — Z01812 Encounter for preprocedural laboratory examination: Secondary | ICD-10-CM | POA: Diagnosis not present

## 2019-02-07 DIAGNOSIS — Z1159 Encounter for screening for other viral diseases: Secondary | ICD-10-CM | POA: Diagnosis not present

## 2019-02-07 DIAGNOSIS — N186 End stage renal disease: Secondary | ICD-10-CM | POA: Diagnosis not present

## 2019-02-07 DIAGNOSIS — N2889 Other specified disorders of kidney and ureter: Secondary | ICD-10-CM | POA: Diagnosis not present

## 2019-02-07 DIAGNOSIS — D509 Iron deficiency anemia, unspecified: Secondary | ICD-10-CM | POA: Diagnosis not present

## 2019-02-07 DIAGNOSIS — N2581 Secondary hyperparathyroidism of renal origin: Secondary | ICD-10-CM | POA: Diagnosis not present

## 2019-02-07 DIAGNOSIS — R109 Unspecified abdominal pain: Secondary | ICD-10-CM | POA: Diagnosis not present

## 2019-02-08 DIAGNOSIS — N2581 Secondary hyperparathyroidism of renal origin: Secondary | ICD-10-CM | POA: Diagnosis not present

## 2019-02-08 DIAGNOSIS — D509 Iron deficiency anemia, unspecified: Secondary | ICD-10-CM | POA: Diagnosis not present

## 2019-02-08 DIAGNOSIS — D631 Anemia in chronic kidney disease: Secondary | ICD-10-CM | POA: Diagnosis not present

## 2019-02-08 DIAGNOSIS — N186 End stage renal disease: Secondary | ICD-10-CM | POA: Diagnosis not present

## 2019-02-09 DIAGNOSIS — Z681 Body mass index (BMI) 19 or less, adult: Secondary | ICD-10-CM | POA: Diagnosis not present

## 2019-02-09 DIAGNOSIS — H6242 Otitis externa in other diseases classified elsewhere, left ear: Secondary | ICD-10-CM | POA: Diagnosis not present

## 2019-02-12 DIAGNOSIS — N186 End stage renal disease: Secondary | ICD-10-CM | POA: Diagnosis not present

## 2019-02-12 DIAGNOSIS — N179 Acute kidney failure, unspecified: Secondary | ICD-10-CM | POA: Diagnosis not present

## 2019-02-12 DIAGNOSIS — K7581 Nonalcoholic steatohepatitis (NASH): Secondary | ICD-10-CM | POA: Diagnosis not present

## 2019-02-12 DIAGNOSIS — I5030 Unspecified diastolic (congestive) heart failure: Secondary | ICD-10-CM | POA: Diagnosis not present

## 2019-02-12 DIAGNOSIS — I252 Old myocardial infarction: Secondary | ICD-10-CM | POA: Diagnosis not present

## 2019-02-12 DIAGNOSIS — K7460 Unspecified cirrhosis of liver: Secondary | ICD-10-CM | POA: Diagnosis not present

## 2019-02-12 DIAGNOSIS — D500 Iron deficiency anemia secondary to blood loss (chronic): Secondary | ICD-10-CM | POA: Diagnosis not present

## 2019-02-12 DIAGNOSIS — I8501 Esophageal varices with bleeding: Secondary | ICD-10-CM | POA: Diagnosis not present

## 2019-02-12 DIAGNOSIS — E1122 Type 2 diabetes mellitus with diabetic chronic kidney disease: Secondary | ICD-10-CM | POA: Diagnosis not present

## 2019-02-12 DIAGNOSIS — I132 Hypertensive heart and chronic kidney disease with heart failure and with stage 5 chronic kidney disease, or end stage renal disease: Secondary | ICD-10-CM | POA: Diagnosis not present

## 2019-02-13 DIAGNOSIS — K660 Peritoneal adhesions (postprocedural) (postinfection): Secondary | ICD-10-CM | POA: Diagnosis not present

## 2019-02-13 DIAGNOSIS — T85611A Breakdown (mechanical) of intraperitoneal dialysis catheter, initial encounter: Secondary | ICD-10-CM | POA: Diagnosis not present

## 2019-02-13 DIAGNOSIS — Z4902 Encounter for fitting and adjustment of peritoneal dialysis catheter: Secondary | ICD-10-CM | POA: Diagnosis not present

## 2019-02-13 DIAGNOSIS — T85611D Breakdown (mechanical) of intraperitoneal dialysis catheter, subsequent encounter: Secondary | ICD-10-CM | POA: Diagnosis not present

## 2019-02-14 DIAGNOSIS — E872 Acidosis: Secondary | ICD-10-CM | POA: Diagnosis not present

## 2019-02-14 DIAGNOSIS — Z992 Dependence on renal dialysis: Secondary | ICD-10-CM | POA: Diagnosis not present

## 2019-02-14 DIAGNOSIS — D631 Anemia in chronic kidney disease: Secondary | ICD-10-CM | POA: Diagnosis not present

## 2019-02-14 DIAGNOSIS — T85611D Breakdown (mechanical) of intraperitoneal dialysis catheter, subsequent encounter: Secondary | ICD-10-CM | POA: Diagnosis not present

## 2019-02-14 DIAGNOSIS — E1122 Type 2 diabetes mellitus with diabetic chronic kidney disease: Secondary | ICD-10-CM | POA: Diagnosis not present

## 2019-02-14 DIAGNOSIS — N186 End stage renal disease: Secondary | ICD-10-CM | POA: Diagnosis not present

## 2019-02-14 DIAGNOSIS — E878 Other disorders of electrolyte and fluid balance, not elsewhere classified: Secondary | ICD-10-CM | POA: Diagnosis not present

## 2019-02-15 DIAGNOSIS — E872 Acidosis: Secondary | ICD-10-CM | POA: Diagnosis not present

## 2019-02-15 DIAGNOSIS — Y832 Surgical operation with anastomosis, bypass or graft as the cause of abnormal reaction of the patient, or of later complication, without mention of misadventure at the time of the procedure: Secondary | ICD-10-CM | POA: Diagnosis not present

## 2019-02-15 DIAGNOSIS — T85611A Breakdown (mechanical) of intraperitoneal dialysis catheter, initial encounter: Secondary | ICD-10-CM | POA: Diagnosis not present

## 2019-02-15 DIAGNOSIS — I5030 Unspecified diastolic (congestive) heart failure: Secondary | ICD-10-CM | POA: Diagnosis not present

## 2019-02-15 DIAGNOSIS — K3189 Other diseases of stomach and duodenum: Secondary | ICD-10-CM | POA: Diagnosis not present

## 2019-02-15 DIAGNOSIS — N186 End stage renal disease: Secondary | ICD-10-CM | POA: Diagnosis not present

## 2019-02-15 DIAGNOSIS — K922 Gastrointestinal hemorrhage, unspecified: Secondary | ICD-10-CM | POA: Diagnosis not present

## 2019-02-15 DIAGNOSIS — R188 Other ascites: Secondary | ICD-10-CM | POA: Diagnosis not present

## 2019-02-15 DIAGNOSIS — I5032 Chronic diastolic (congestive) heart failure: Secondary | ICD-10-CM | POA: Diagnosis not present

## 2019-02-15 DIAGNOSIS — M25552 Pain in left hip: Secondary | ICD-10-CM | POA: Diagnosis not present

## 2019-02-15 DIAGNOSIS — I959 Hypotension, unspecified: Secondary | ICD-10-CM | POA: Diagnosis not present

## 2019-02-15 DIAGNOSIS — K7581 Nonalcoholic steatohepatitis (NASH): Secondary | ICD-10-CM | POA: Diagnosis not present

## 2019-02-15 DIAGNOSIS — K766 Portal hypertension: Secondary | ICD-10-CM | POA: Diagnosis not present

## 2019-02-15 DIAGNOSIS — D631 Anemia in chronic kidney disease: Secondary | ICD-10-CM | POA: Diagnosis not present

## 2019-02-15 DIAGNOSIS — R64 Cachexia: Secondary | ICD-10-CM | POA: Diagnosis not present

## 2019-02-15 DIAGNOSIS — M25551 Pain in right hip: Secondary | ICD-10-CM | POA: Diagnosis not present

## 2019-02-15 DIAGNOSIS — K921 Melena: Secondary | ICD-10-CM | POA: Diagnosis not present

## 2019-02-15 DIAGNOSIS — G8929 Other chronic pain: Secondary | ICD-10-CM | POA: Diagnosis not present

## 2019-02-15 DIAGNOSIS — K7460 Unspecified cirrhosis of liver: Secondary | ICD-10-CM | POA: Diagnosis not present

## 2019-02-15 DIAGNOSIS — I132 Hypertensive heart and chronic kidney disease with heart failure and with stage 5 chronic kidney disease, or end stage renal disease: Secondary | ICD-10-CM | POA: Diagnosis not present

## 2019-02-15 DIAGNOSIS — D649 Anemia, unspecified: Secondary | ICD-10-CM | POA: Diagnosis not present

## 2019-02-15 DIAGNOSIS — I130 Hypertensive heart and chronic kidney disease with heart failure and stage 1 through stage 4 chronic kidney disease, or unspecified chronic kidney disease: Secondary | ICD-10-CM | POA: Diagnosis not present

## 2019-02-15 DIAGNOSIS — I5021 Acute systolic (congestive) heart failure: Secondary | ICD-10-CM | POA: Diagnosis not present

## 2019-02-15 DIAGNOSIS — T85611D Breakdown (mechanical) of intraperitoneal dialysis catheter, subsequent encounter: Secondary | ICD-10-CM | POA: Diagnosis not present

## 2019-02-15 DIAGNOSIS — I4891 Unspecified atrial fibrillation: Secondary | ICD-10-CM | POA: Diagnosis not present

## 2019-02-15 DIAGNOSIS — Z515 Encounter for palliative care: Secondary | ICD-10-CM | POA: Diagnosis not present

## 2019-02-15 DIAGNOSIS — E1122 Type 2 diabetes mellitus with diabetic chronic kidney disease: Secondary | ICD-10-CM | POA: Diagnosis not present

## 2019-02-15 DIAGNOSIS — N179 Acute kidney failure, unspecified: Secondary | ICD-10-CM | POA: Diagnosis not present

## 2019-02-15 DIAGNOSIS — E871 Hypo-osmolality and hyponatremia: Secondary | ICD-10-CM | POA: Diagnosis not present

## 2019-02-15 DIAGNOSIS — I8500 Esophageal varices without bleeding: Secondary | ICD-10-CM | POA: Diagnosis not present

## 2019-02-15 DIAGNOSIS — R69 Illness, unspecified: Secondary | ICD-10-CM | POA: Diagnosis not present

## 2019-02-15 DIAGNOSIS — K449 Diaphragmatic hernia without obstruction or gangrene: Secondary | ICD-10-CM | POA: Diagnosis not present

## 2019-02-15 DIAGNOSIS — K7469 Other cirrhosis of liver: Secondary | ICD-10-CM | POA: Diagnosis not present

## 2019-02-15 DIAGNOSIS — N189 Chronic kidney disease, unspecified: Secondary | ICD-10-CM | POA: Diagnosis not present

## 2019-02-15 DIAGNOSIS — D62 Acute posthemorrhagic anemia: Secondary | ICD-10-CM | POA: Diagnosis not present

## 2019-02-15 DIAGNOSIS — Z992 Dependence on renal dialysis: Secondary | ICD-10-CM | POA: Diagnosis not present

## 2019-02-16 DIAGNOSIS — T85611A Breakdown (mechanical) of intraperitoneal dialysis catheter, initial encounter: Secondary | ICD-10-CM | POA: Diagnosis not present

## 2019-02-16 DIAGNOSIS — N179 Acute kidney failure, unspecified: Secondary | ICD-10-CM | POA: Diagnosis not present

## 2019-02-16 DIAGNOSIS — M25551 Pain in right hip: Secondary | ICD-10-CM | POA: Diagnosis not present

## 2019-02-16 DIAGNOSIS — R69 Illness, unspecified: Secondary | ICD-10-CM | POA: Diagnosis not present

## 2019-02-16 DIAGNOSIS — Z992 Dependence on renal dialysis: Secondary | ICD-10-CM | POA: Diagnosis not present

## 2019-02-16 DIAGNOSIS — E1122 Type 2 diabetes mellitus with diabetic chronic kidney disease: Secondary | ICD-10-CM | POA: Diagnosis not present

## 2019-02-16 DIAGNOSIS — N186 End stage renal disease: Secondary | ICD-10-CM | POA: Diagnosis not present

## 2019-02-16 DIAGNOSIS — D631 Anemia in chronic kidney disease: Secondary | ICD-10-CM | POA: Diagnosis not present

## 2019-02-16 DIAGNOSIS — T85611D Breakdown (mechanical) of intraperitoneal dialysis catheter, subsequent encounter: Secondary | ICD-10-CM | POA: Diagnosis not present

## 2019-02-16 DIAGNOSIS — M25552 Pain in left hip: Secondary | ICD-10-CM | POA: Diagnosis not present

## 2019-02-17 DIAGNOSIS — E872 Acidosis: Secondary | ICD-10-CM | POA: Diagnosis not present

## 2019-02-17 DIAGNOSIS — N186 End stage renal disease: Secondary | ICD-10-CM | POA: Diagnosis not present

## 2019-02-17 DIAGNOSIS — E871 Hypo-osmolality and hyponatremia: Secondary | ICD-10-CM | POA: Diagnosis not present

## 2019-02-17 DIAGNOSIS — T85611D Breakdown (mechanical) of intraperitoneal dialysis catheter, subsequent encounter: Secondary | ICD-10-CM | POA: Diagnosis not present

## 2019-02-17 DIAGNOSIS — Z992 Dependence on renal dialysis: Secondary | ICD-10-CM | POA: Diagnosis not present

## 2019-02-17 DIAGNOSIS — D631 Anemia in chronic kidney disease: Secondary | ICD-10-CM | POA: Diagnosis not present

## 2019-02-17 DIAGNOSIS — D649 Anemia, unspecified: Secondary | ICD-10-CM | POA: Diagnosis not present

## 2019-02-17 DIAGNOSIS — I959 Hypotension, unspecified: Secondary | ICD-10-CM | POA: Diagnosis not present

## 2019-02-18 DIAGNOSIS — I959 Hypotension, unspecified: Secondary | ICD-10-CM | POA: Diagnosis not present

## 2019-02-18 DIAGNOSIS — E871 Hypo-osmolality and hyponatremia: Secondary | ICD-10-CM | POA: Diagnosis not present

## 2019-02-18 DIAGNOSIS — T85611D Breakdown (mechanical) of intraperitoneal dialysis catheter, subsequent encounter: Secondary | ICD-10-CM | POA: Diagnosis not present

## 2019-02-18 DIAGNOSIS — D649 Anemia, unspecified: Secondary | ICD-10-CM | POA: Diagnosis not present

## 2019-02-18 DIAGNOSIS — D631 Anemia in chronic kidney disease: Secondary | ICD-10-CM | POA: Diagnosis not present

## 2019-02-18 DIAGNOSIS — Z992 Dependence on renal dialysis: Secondary | ICD-10-CM | POA: Diagnosis not present

## 2019-02-18 DIAGNOSIS — N186 End stage renal disease: Secondary | ICD-10-CM | POA: Diagnosis not present

## 2019-02-19 DIAGNOSIS — I959 Hypotension, unspecified: Secondary | ICD-10-CM | POA: Diagnosis not present

## 2019-02-19 DIAGNOSIS — T85611D Breakdown (mechanical) of intraperitoneal dialysis catheter, subsequent encounter: Secondary | ICD-10-CM | POA: Diagnosis not present

## 2019-02-19 DIAGNOSIS — E872 Acidosis: Secondary | ICD-10-CM | POA: Diagnosis not present

## 2019-02-19 DIAGNOSIS — Z992 Dependence on renal dialysis: Secondary | ICD-10-CM | POA: Diagnosis not present

## 2019-02-19 DIAGNOSIS — K922 Gastrointestinal hemorrhage, unspecified: Secondary | ICD-10-CM | POA: Diagnosis not present

## 2019-02-19 DIAGNOSIS — D649 Anemia, unspecified: Secondary | ICD-10-CM | POA: Diagnosis not present

## 2019-02-19 DIAGNOSIS — N186 End stage renal disease: Secondary | ICD-10-CM | POA: Diagnosis not present

## 2019-02-19 DIAGNOSIS — I4891 Unspecified atrial fibrillation: Secondary | ICD-10-CM | POA: Diagnosis not present

## 2019-02-19 DIAGNOSIS — E871 Hypo-osmolality and hyponatremia: Secondary | ICD-10-CM | POA: Diagnosis not present

## 2019-02-19 DIAGNOSIS — E1122 Type 2 diabetes mellitus with diabetic chronic kidney disease: Secondary | ICD-10-CM | POA: Diagnosis not present

## 2019-02-20 DIAGNOSIS — I5021 Acute systolic (congestive) heart failure: Secondary | ICD-10-CM | POA: Diagnosis not present

## 2019-02-20 DIAGNOSIS — N186 End stage renal disease: Secondary | ICD-10-CM | POA: Diagnosis not present

## 2019-02-20 DIAGNOSIS — K7469 Other cirrhosis of liver: Secondary | ICD-10-CM | POA: Diagnosis not present

## 2019-02-20 DIAGNOSIS — N189 Chronic kidney disease, unspecified: Secondary | ICD-10-CM | POA: Diagnosis not present

## 2019-02-20 DIAGNOSIS — R188 Other ascites: Secondary | ICD-10-CM | POA: Diagnosis not present

## 2019-02-20 DIAGNOSIS — D631 Anemia in chronic kidney disease: Secondary | ICD-10-CM | POA: Diagnosis not present

## 2019-02-20 DIAGNOSIS — Z992 Dependence on renal dialysis: Secondary | ICD-10-CM | POA: Diagnosis not present

## 2019-02-20 DIAGNOSIS — K7581 Nonalcoholic steatohepatitis (NASH): Secondary | ICD-10-CM | POA: Diagnosis not present

## 2019-02-20 DIAGNOSIS — I959 Hypotension, unspecified: Secondary | ICD-10-CM | POA: Diagnosis not present

## 2019-02-20 DIAGNOSIS — E1122 Type 2 diabetes mellitus with diabetic chronic kidney disease: Secondary | ICD-10-CM | POA: Diagnosis not present

## 2019-02-21 DIAGNOSIS — I959 Hypotension, unspecified: Secondary | ICD-10-CM | POA: Diagnosis not present

## 2019-02-21 DIAGNOSIS — D631 Anemia in chronic kidney disease: Secondary | ICD-10-CM | POA: Diagnosis not present

## 2019-02-21 DIAGNOSIS — K7581 Nonalcoholic steatohepatitis (NASH): Secondary | ICD-10-CM | POA: Diagnosis not present

## 2019-02-21 DIAGNOSIS — D62 Acute posthemorrhagic anemia: Secondary | ICD-10-CM | POA: Diagnosis not present

## 2019-02-21 DIAGNOSIS — Z992 Dependence on renal dialysis: Secondary | ICD-10-CM | POA: Diagnosis not present

## 2019-02-21 DIAGNOSIS — R188 Other ascites: Secondary | ICD-10-CM | POA: Diagnosis not present

## 2019-02-21 DIAGNOSIS — K766 Portal hypertension: Secondary | ICD-10-CM | POA: Diagnosis not present

## 2019-02-21 DIAGNOSIS — N186 End stage renal disease: Secondary | ICD-10-CM | POA: Diagnosis not present

## 2019-02-21 DIAGNOSIS — E1122 Type 2 diabetes mellitus with diabetic chronic kidney disease: Secondary | ICD-10-CM | POA: Diagnosis not present

## 2019-02-21 DIAGNOSIS — N189 Chronic kidney disease, unspecified: Secondary | ICD-10-CM | POA: Diagnosis not present

## 2019-02-21 DIAGNOSIS — K921 Melena: Secondary | ICD-10-CM | POA: Diagnosis not present

## 2019-02-21 DIAGNOSIS — K7469 Other cirrhosis of liver: Secondary | ICD-10-CM | POA: Diagnosis not present

## 2019-02-22 DIAGNOSIS — R188 Other ascites: Secondary | ICD-10-CM | POA: Diagnosis not present

## 2019-02-22 DIAGNOSIS — K3189 Other diseases of stomach and duodenum: Secondary | ICD-10-CM | POA: Diagnosis not present

## 2019-02-22 DIAGNOSIS — I8500 Esophageal varices without bleeding: Secondary | ICD-10-CM | POA: Diagnosis not present

## 2019-02-22 DIAGNOSIS — K921 Melena: Secondary | ICD-10-CM | POA: Diagnosis not present

## 2019-02-22 DIAGNOSIS — K7581 Nonalcoholic steatohepatitis (NASH): Secondary | ICD-10-CM | POA: Diagnosis not present

## 2019-02-22 DIAGNOSIS — I959 Hypotension, unspecified: Secondary | ICD-10-CM | POA: Diagnosis not present

## 2019-02-22 DIAGNOSIS — K766 Portal hypertension: Secondary | ICD-10-CM | POA: Diagnosis not present

## 2019-02-22 DIAGNOSIS — Z992 Dependence on renal dialysis: Secondary | ICD-10-CM | POA: Diagnosis not present

## 2019-02-22 DIAGNOSIS — D649 Anemia, unspecified: Secondary | ICD-10-CM | POA: Diagnosis not present

## 2019-02-22 DIAGNOSIS — K449 Diaphragmatic hernia without obstruction or gangrene: Secondary | ICD-10-CM | POA: Diagnosis not present

## 2019-02-22 DIAGNOSIS — E1122 Type 2 diabetes mellitus with diabetic chronic kidney disease: Secondary | ICD-10-CM | POA: Diagnosis not present

## 2019-02-22 DIAGNOSIS — D631 Anemia in chronic kidney disease: Secondary | ICD-10-CM | POA: Diagnosis not present

## 2019-02-22 DIAGNOSIS — N186 End stage renal disease: Secondary | ICD-10-CM | POA: Diagnosis not present

## 2019-02-22 DIAGNOSIS — K7469 Other cirrhosis of liver: Secondary | ICD-10-CM | POA: Diagnosis not present

## 2019-02-23 DIAGNOSIS — I959 Hypotension, unspecified: Secondary | ICD-10-CM | POA: Diagnosis not present

## 2019-02-23 DIAGNOSIS — N186 End stage renal disease: Secondary | ICD-10-CM | POA: Diagnosis not present

## 2019-02-23 DIAGNOSIS — Z992 Dependence on renal dialysis: Secondary | ICD-10-CM | POA: Diagnosis not present

## 2019-02-23 DIAGNOSIS — K921 Melena: Secondary | ICD-10-CM | POA: Diagnosis not present

## 2019-02-24 DIAGNOSIS — T85611D Breakdown (mechanical) of intraperitoneal dialysis catheter, subsequent encounter: Secondary | ICD-10-CM | POA: Diagnosis not present

## 2019-02-24 DIAGNOSIS — K921 Melena: Secondary | ICD-10-CM | POA: Diagnosis not present

## 2019-02-24 DIAGNOSIS — E1122 Type 2 diabetes mellitus with diabetic chronic kidney disease: Secondary | ICD-10-CM | POA: Diagnosis not present

## 2019-02-24 DIAGNOSIS — Z992 Dependence on renal dialysis: Secondary | ICD-10-CM | POA: Diagnosis not present

## 2019-02-24 DIAGNOSIS — I130 Hypertensive heart and chronic kidney disease with heart failure and stage 1 through stage 4 chronic kidney disease, or unspecified chronic kidney disease: Secondary | ICD-10-CM | POA: Diagnosis not present

## 2019-02-24 DIAGNOSIS — N186 End stage renal disease: Secondary | ICD-10-CM | POA: Diagnosis not present

## 2019-02-24 DIAGNOSIS — I5030 Unspecified diastolic (congestive) heart failure: Secondary | ICD-10-CM | POA: Diagnosis not present

## 2019-02-24 DIAGNOSIS — E871 Hypo-osmolality and hyponatremia: Secondary | ICD-10-CM | POA: Diagnosis not present

## 2019-02-24 DIAGNOSIS — D631 Anemia in chronic kidney disease: Secondary | ICD-10-CM | POA: Diagnosis not present

## 2019-02-24 DIAGNOSIS — I959 Hypotension, unspecified: Secondary | ICD-10-CM | POA: Diagnosis not present

## 2019-02-25 DIAGNOSIS — K921 Melena: Secondary | ICD-10-CM | POA: Diagnosis not present

## 2019-02-25 DIAGNOSIS — T85611D Breakdown (mechanical) of intraperitoneal dialysis catheter, subsequent encounter: Secondary | ICD-10-CM | POA: Diagnosis not present

## 2019-02-25 DIAGNOSIS — I959 Hypotension, unspecified: Secondary | ICD-10-CM | POA: Diagnosis not present

## 2019-02-25 DIAGNOSIS — E871 Hypo-osmolality and hyponatremia: Secondary | ICD-10-CM | POA: Diagnosis not present

## 2019-02-25 DIAGNOSIS — N186 End stage renal disease: Secondary | ICD-10-CM | POA: Diagnosis not present

## 2019-02-25 DIAGNOSIS — Z992 Dependence on renal dialysis: Secondary | ICD-10-CM | POA: Diagnosis not present

## 2019-02-25 DIAGNOSIS — I130 Hypertensive heart and chronic kidney disease with heart failure and stage 1 through stage 4 chronic kidney disease, or unspecified chronic kidney disease: Secondary | ICD-10-CM | POA: Diagnosis not present

## 2019-02-25 DIAGNOSIS — I5030 Unspecified diastolic (congestive) heart failure: Secondary | ICD-10-CM | POA: Diagnosis not present

## 2019-02-25 DIAGNOSIS — E1122 Type 2 diabetes mellitus with diabetic chronic kidney disease: Secondary | ICD-10-CM | POA: Diagnosis not present

## 2019-02-25 DIAGNOSIS — D631 Anemia in chronic kidney disease: Secondary | ICD-10-CM | POA: Diagnosis not present

## 2019-02-26 DIAGNOSIS — K921 Melena: Secondary | ICD-10-CM | POA: Diagnosis not present

## 2019-02-26 DIAGNOSIS — Z992 Dependence on renal dialysis: Secondary | ICD-10-CM | POA: Diagnosis not present

## 2019-02-26 DIAGNOSIS — I959 Hypotension, unspecified: Secondary | ICD-10-CM | POA: Diagnosis not present

## 2019-02-26 DIAGNOSIS — N186 End stage renal disease: Secondary | ICD-10-CM | POA: Diagnosis not present

## 2019-02-27 DIAGNOSIS — I132 Hypertensive heart and chronic kidney disease with heart failure and with stage 5 chronic kidney disease, or end stage renal disease: Secondary | ICD-10-CM | POA: Diagnosis not present

## 2019-02-27 DIAGNOSIS — K7460 Unspecified cirrhosis of liver: Secondary | ICD-10-CM | POA: Diagnosis not present

## 2019-02-27 DIAGNOSIS — I5030 Unspecified diastolic (congestive) heart failure: Secondary | ICD-10-CM | POA: Diagnosis not present

## 2019-02-27 DIAGNOSIS — R188 Other ascites: Secondary | ICD-10-CM | POA: Diagnosis not present

## 2019-02-27 DIAGNOSIS — K7581 Nonalcoholic steatohepatitis (NASH): Secondary | ICD-10-CM | POA: Diagnosis not present

## 2019-02-27 DIAGNOSIS — K922 Gastrointestinal hemorrhage, unspecified: Secondary | ICD-10-CM | POA: Diagnosis not present

## 2019-02-27 DIAGNOSIS — T82534D Leakage of infusion catheter, subsequent encounter: Secondary | ICD-10-CM | POA: Diagnosis not present

## 2019-02-27 DIAGNOSIS — E1122 Type 2 diabetes mellitus with diabetic chronic kidney disease: Secondary | ICD-10-CM | POA: Diagnosis not present

## 2019-02-27 DIAGNOSIS — I8501 Esophageal varices with bleeding: Secondary | ICD-10-CM | POA: Diagnosis not present

## 2019-02-27 DIAGNOSIS — Z992 Dependence on renal dialysis: Secondary | ICD-10-CM | POA: Diagnosis not present

## 2019-02-27 DIAGNOSIS — K766 Portal hypertension: Secondary | ICD-10-CM | POA: Diagnosis not present

## 2019-02-27 DIAGNOSIS — N186 End stage renal disease: Secondary | ICD-10-CM | POA: Diagnosis not present

## 2019-02-28 DIAGNOSIS — Z992 Dependence on renal dialysis: Secondary | ICD-10-CM | POA: Diagnosis not present

## 2019-02-28 DIAGNOSIS — N186 End stage renal disease: Secondary | ICD-10-CM | POA: Diagnosis not present

## 2019-03-02 DIAGNOSIS — K7581 Nonalcoholic steatohepatitis (NASH): Secondary | ICD-10-CM | POA: Diagnosis not present

## 2019-03-02 DIAGNOSIS — T82534D Leakage of infusion catheter, subsequent encounter: Secondary | ICD-10-CM | POA: Diagnosis not present

## 2019-03-02 DIAGNOSIS — I5030 Unspecified diastolic (congestive) heart failure: Secondary | ICD-10-CM | POA: Diagnosis not present

## 2019-03-02 DIAGNOSIS — I8501 Esophageal varices with bleeding: Secondary | ICD-10-CM | POA: Diagnosis not present

## 2019-03-02 DIAGNOSIS — E1122 Type 2 diabetes mellitus with diabetic chronic kidney disease: Secondary | ICD-10-CM | POA: Diagnosis not present

## 2019-03-02 DIAGNOSIS — K7460 Unspecified cirrhosis of liver: Secondary | ICD-10-CM | POA: Diagnosis not present

## 2019-03-02 DIAGNOSIS — R188 Other ascites: Secondary | ICD-10-CM | POA: Diagnosis not present

## 2019-03-02 DIAGNOSIS — I132 Hypertensive heart and chronic kidney disease with heart failure and with stage 5 chronic kidney disease, or end stage renal disease: Secondary | ICD-10-CM | POA: Diagnosis not present

## 2019-03-02 DIAGNOSIS — K922 Gastrointestinal hemorrhage, unspecified: Secondary | ICD-10-CM | POA: Diagnosis not present

## 2019-03-02 DIAGNOSIS — K766 Portal hypertension: Secondary | ICD-10-CM | POA: Diagnosis not present

## 2019-03-03 DIAGNOSIS — Z992 Dependence on renal dialysis: Secondary | ICD-10-CM | POA: Diagnosis not present

## 2019-03-03 DIAGNOSIS — N186 End stage renal disease: Secondary | ICD-10-CM | POA: Diagnosis not present

## 2019-03-05 DIAGNOSIS — K766 Portal hypertension: Secondary | ICD-10-CM | POA: Diagnosis not present

## 2019-03-05 DIAGNOSIS — K7460 Unspecified cirrhosis of liver: Secondary | ICD-10-CM | POA: Diagnosis not present

## 2019-03-05 DIAGNOSIS — K7581 Nonalcoholic steatohepatitis (NASH): Secondary | ICD-10-CM | POA: Diagnosis not present

## 2019-03-05 DIAGNOSIS — I5030 Unspecified diastolic (congestive) heart failure: Secondary | ICD-10-CM | POA: Diagnosis not present

## 2019-03-05 DIAGNOSIS — T82534D Leakage of infusion catheter, subsequent encounter: Secondary | ICD-10-CM | POA: Diagnosis not present

## 2019-03-05 DIAGNOSIS — I8501 Esophageal varices with bleeding: Secondary | ICD-10-CM | POA: Diagnosis not present

## 2019-03-05 DIAGNOSIS — K922 Gastrointestinal hemorrhage, unspecified: Secondary | ICD-10-CM | POA: Diagnosis not present

## 2019-03-05 DIAGNOSIS — I132 Hypertensive heart and chronic kidney disease with heart failure and with stage 5 chronic kidney disease, or end stage renal disease: Secondary | ICD-10-CM | POA: Diagnosis not present

## 2019-03-05 DIAGNOSIS — R188 Other ascites: Secondary | ICD-10-CM | POA: Diagnosis not present

## 2019-03-05 DIAGNOSIS — E1122 Type 2 diabetes mellitus with diabetic chronic kidney disease: Secondary | ICD-10-CM | POA: Diagnosis not present

## 2019-03-06 DIAGNOSIS — N186 End stage renal disease: Secondary | ICD-10-CM | POA: Diagnosis not present

## 2019-03-06 DIAGNOSIS — R109 Unspecified abdominal pain: Secondary | ICD-10-CM | POA: Diagnosis not present

## 2019-03-06 DIAGNOSIS — E119 Type 2 diabetes mellitus without complications: Secondary | ICD-10-CM | POA: Diagnosis not present

## 2019-03-06 DIAGNOSIS — Z79899 Other long term (current) drug therapy: Secondary | ICD-10-CM | POA: Diagnosis not present

## 2019-03-06 DIAGNOSIS — Z992 Dependence on renal dialysis: Secondary | ICD-10-CM | POA: Diagnosis not present

## 2019-03-08 DIAGNOSIS — Z992 Dependence on renal dialysis: Secondary | ICD-10-CM | POA: Diagnosis not present

## 2019-03-08 DIAGNOSIS — N186 End stage renal disease: Secondary | ICD-10-CM | POA: Diagnosis not present

## 2019-03-08 DIAGNOSIS — L02211 Cutaneous abscess of abdominal wall: Secondary | ICD-10-CM | POA: Diagnosis not present

## 2019-03-09 DIAGNOSIS — K7581 Nonalcoholic steatohepatitis (NASH): Secondary | ICD-10-CM | POA: Diagnosis not present

## 2019-03-09 DIAGNOSIS — Z992 Dependence on renal dialysis: Secondary | ICD-10-CM | POA: Diagnosis not present

## 2019-03-09 DIAGNOSIS — K7460 Unspecified cirrhosis of liver: Secondary | ICD-10-CM | POA: Diagnosis not present

## 2019-03-09 DIAGNOSIS — K766 Portal hypertension: Secondary | ICD-10-CM | POA: Diagnosis not present

## 2019-03-09 DIAGNOSIS — N186 End stage renal disease: Secondary | ICD-10-CM | POA: Diagnosis not present

## 2019-03-09 DIAGNOSIS — I132 Hypertensive heart and chronic kidney disease with heart failure and with stage 5 chronic kidney disease, or end stage renal disease: Secondary | ICD-10-CM | POA: Diagnosis not present

## 2019-03-09 DIAGNOSIS — R188 Other ascites: Secondary | ICD-10-CM | POA: Diagnosis not present

## 2019-03-09 DIAGNOSIS — I5030 Unspecified diastolic (congestive) heart failure: Secondary | ICD-10-CM | POA: Diagnosis not present

## 2019-03-09 DIAGNOSIS — K7469 Other cirrhosis of liver: Secondary | ICD-10-CM | POA: Diagnosis not present

## 2019-03-09 DIAGNOSIS — T82534D Leakage of infusion catheter, subsequent encounter: Secondary | ICD-10-CM | POA: Diagnosis not present

## 2019-03-09 DIAGNOSIS — I8501 Esophageal varices with bleeding: Secondary | ICD-10-CM | POA: Diagnosis not present

## 2019-03-09 DIAGNOSIS — I8500 Esophageal varices without bleeding: Secondary | ICD-10-CM | POA: Diagnosis not present

## 2019-03-09 DIAGNOSIS — E1122 Type 2 diabetes mellitus with diabetic chronic kidney disease: Secondary | ICD-10-CM | POA: Diagnosis not present

## 2019-03-09 DIAGNOSIS — K922 Gastrointestinal hemorrhage, unspecified: Secondary | ICD-10-CM | POA: Diagnosis not present

## 2019-03-12 DIAGNOSIS — Z992 Dependence on renal dialysis: Secondary | ICD-10-CM | POA: Diagnosis not present

## 2019-03-12 DIAGNOSIS — N186 End stage renal disease: Secondary | ICD-10-CM | POA: Diagnosis not present

## 2019-03-12 DIAGNOSIS — L02211 Cutaneous abscess of abdominal wall: Secondary | ICD-10-CM | POA: Diagnosis not present

## 2019-03-13 DIAGNOSIS — L02211 Cutaneous abscess of abdominal wall: Secondary | ICD-10-CM | POA: Diagnosis not present

## 2019-03-13 DIAGNOSIS — Z992 Dependence on renal dialysis: Secondary | ICD-10-CM | POA: Diagnosis not present

## 2019-03-13 DIAGNOSIS — N186 End stage renal disease: Secondary | ICD-10-CM | POA: Diagnosis not present

## 2019-03-14 DIAGNOSIS — L02211 Cutaneous abscess of abdominal wall: Secondary | ICD-10-CM | POA: Diagnosis not present

## 2019-03-14 DIAGNOSIS — Z992 Dependence on renal dialysis: Secondary | ICD-10-CM | POA: Diagnosis not present

## 2019-03-14 DIAGNOSIS — N186 End stage renal disease: Secondary | ICD-10-CM | POA: Diagnosis not present

## 2019-03-15 DIAGNOSIS — L02211 Cutaneous abscess of abdominal wall: Secondary | ICD-10-CM | POA: Diagnosis not present

## 2019-03-15 DIAGNOSIS — Z992 Dependence on renal dialysis: Secondary | ICD-10-CM | POA: Diagnosis not present

## 2019-03-15 DIAGNOSIS — N186 End stage renal disease: Secondary | ICD-10-CM | POA: Diagnosis not present

## 2019-03-16 DIAGNOSIS — K922 Gastrointestinal hemorrhage, unspecified: Secondary | ICD-10-CM | POA: Diagnosis not present

## 2019-03-16 DIAGNOSIS — E1122 Type 2 diabetes mellitus with diabetic chronic kidney disease: Secondary | ICD-10-CM | POA: Diagnosis not present

## 2019-03-16 DIAGNOSIS — I5030 Unspecified diastolic (congestive) heart failure: Secondary | ICD-10-CM | POA: Diagnosis not present

## 2019-03-16 DIAGNOSIS — I132 Hypertensive heart and chronic kidney disease with heart failure and with stage 5 chronic kidney disease, or end stage renal disease: Secondary | ICD-10-CM | POA: Diagnosis not present

## 2019-03-16 DIAGNOSIS — K766 Portal hypertension: Secondary | ICD-10-CM | POA: Diagnosis not present

## 2019-03-16 DIAGNOSIS — T82534D Leakage of infusion catheter, subsequent encounter: Secondary | ICD-10-CM | POA: Diagnosis not present

## 2019-03-16 DIAGNOSIS — R188 Other ascites: Secondary | ICD-10-CM | POA: Diagnosis not present

## 2019-03-16 DIAGNOSIS — K7460 Unspecified cirrhosis of liver: Secondary | ICD-10-CM | POA: Diagnosis not present

## 2019-03-16 DIAGNOSIS — I8501 Esophageal varices with bleeding: Secondary | ICD-10-CM | POA: Diagnosis not present

## 2019-03-16 DIAGNOSIS — K7581 Nonalcoholic steatohepatitis (NASH): Secondary | ICD-10-CM | POA: Diagnosis not present

## 2019-03-17 ENCOUNTER — Other Ambulatory Visit: Payer: Self-pay

## 2019-03-17 ENCOUNTER — Encounter: Payer: Self-pay | Admitting: Emergency Medicine

## 2019-03-17 ENCOUNTER — Inpatient Hospital Stay
Admission: EM | Admit: 2019-03-17 | Discharge: 2019-03-22 | DRG: 640 | Disposition: A | Discharge status: Home / Self Care | Payer: Medicare HMO | Attending: Internal Medicine | Admitting: Internal Medicine

## 2019-03-17 ENCOUNTER — Emergency Department: Payer: Medicare HMO

## 2019-03-17 DIAGNOSIS — K7581 Nonalcoholic steatohepatitis (NASH): Secondary | ICD-10-CM | POA: Diagnosis present

## 2019-03-17 DIAGNOSIS — I8500 Esophageal varices without bleeding: Secondary | ICD-10-CM | POA: Diagnosis not present

## 2019-03-17 DIAGNOSIS — Z681 Body mass index (BMI) 19 or less, adult: Secondary | ICD-10-CM | POA: Diagnosis not present

## 2019-03-17 DIAGNOSIS — R911 Solitary pulmonary nodule: Secondary | ICD-10-CM

## 2019-03-17 DIAGNOSIS — B9562 Methicillin resistant Staphylococcus aureus infection as the cause of diseases classified elsewhere: Secondary | ICD-10-CM | POA: Diagnosis present

## 2019-03-17 DIAGNOSIS — D62 Acute posthemorrhagic anemia: Secondary | ICD-10-CM | POA: Diagnosis present

## 2019-03-17 DIAGNOSIS — E871 Hypo-osmolality and hyponatremia: Secondary | ICD-10-CM

## 2019-03-17 DIAGNOSIS — R0602 Shortness of breath: Secondary | ICD-10-CM | POA: Diagnosis not present

## 2019-03-17 DIAGNOSIS — R64 Cachexia: Secondary | ICD-10-CM | POA: Diagnosis not present

## 2019-03-17 DIAGNOSIS — Z20828 Contact with and (suspected) exposure to other viral communicable diseases: Secondary | ICD-10-CM | POA: Diagnosis present

## 2019-03-17 DIAGNOSIS — R059 Cough, unspecified: Secondary | ICD-10-CM

## 2019-03-17 DIAGNOSIS — Z85528 Personal history of other malignant neoplasm of kidney: Secondary | ICD-10-CM | POA: Diagnosis not present

## 2019-03-17 DIAGNOSIS — L0889 Other specified local infections of the skin and subcutaneous tissue: Secondary | ICD-10-CM | POA: Diagnosis present

## 2019-03-17 DIAGNOSIS — I898 Other specified noninfective disorders of lymphatic vessels and lymph nodes: Secondary | ICD-10-CM | POA: Diagnosis present

## 2019-03-17 DIAGNOSIS — Z515 Encounter for palliative care: Secondary | ICD-10-CM | POA: Diagnosis not present

## 2019-03-17 DIAGNOSIS — E875 Hyperkalemia: Secondary | ICD-10-CM | POA: Diagnosis not present

## 2019-03-17 DIAGNOSIS — E1122 Type 2 diabetes mellitus with diabetic chronic kidney disease: Secondary | ICD-10-CM | POA: Diagnosis present

## 2019-03-17 DIAGNOSIS — I132 Hypertensive heart and chronic kidney disease with heart failure and with stage 5 chronic kidney disease, or end stage renal disease: Secondary | ICD-10-CM | POA: Diagnosis present

## 2019-03-17 DIAGNOSIS — I8511 Secondary esophageal varices with bleeding: Secondary | ICD-10-CM | POA: Diagnosis not present

## 2019-03-17 DIAGNOSIS — R918 Other nonspecific abnormal finding of lung field: Secondary | ICD-10-CM | POA: Diagnosis not present

## 2019-03-17 DIAGNOSIS — R05 Cough: Secondary | ICD-10-CM | POA: Diagnosis not present

## 2019-03-17 DIAGNOSIS — N186 End stage renal disease: Secondary | ICD-10-CM | POA: Diagnosis not present

## 2019-03-17 DIAGNOSIS — K7469 Other cirrhosis of liver: Secondary | ICD-10-CM

## 2019-03-17 DIAGNOSIS — Z992 Dependence on renal dialysis: Secondary | ICD-10-CM | POA: Diagnosis not present

## 2019-03-17 DIAGNOSIS — A4901 Methicillin susceptible Staphylococcus aureus infection, unspecified site: Secondary | ICD-10-CM | POA: Diagnosis not present

## 2019-03-17 DIAGNOSIS — R062 Wheezing: Secondary | ICD-10-CM | POA: Diagnosis not present

## 2019-03-17 DIAGNOSIS — Z7189 Other specified counseling: Secondary | ICD-10-CM | POA: Diagnosis not present

## 2019-03-17 DIAGNOSIS — R042 Hemoptysis: Secondary | ICD-10-CM | POA: Diagnosis not present

## 2019-03-17 DIAGNOSIS — Z800 Family history of malignant neoplasm of digestive organs: Secondary | ICD-10-CM

## 2019-03-17 DIAGNOSIS — R531 Weakness: Secondary | ICD-10-CM | POA: Diagnosis not present

## 2019-03-17 DIAGNOSIS — E785 Hyperlipidemia, unspecified: Secondary | ICD-10-CM | POA: Diagnosis present

## 2019-03-17 DIAGNOSIS — Z66 Do not resuscitate: Secondary | ICD-10-CM | POA: Diagnosis present

## 2019-03-17 DIAGNOSIS — D649 Anemia, unspecified: Secondary | ICD-10-CM | POA: Diagnosis not present

## 2019-03-17 DIAGNOSIS — R109 Unspecified abdominal pain: Secondary | ICD-10-CM | POA: Diagnosis not present

## 2019-03-17 DIAGNOSIS — Z87891 Personal history of nicotine dependence: Secondary | ICD-10-CM

## 2019-03-17 DIAGNOSIS — K766 Portal hypertension: Secondary | ICD-10-CM | POA: Diagnosis present

## 2019-03-17 DIAGNOSIS — D631 Anemia in chronic kidney disease: Secondary | ICD-10-CM

## 2019-03-17 DIAGNOSIS — I120 Hypertensive chronic kidney disease with stage 5 chronic kidney disease or end stage renal disease: Secondary | ICD-10-CM | POA: Diagnosis not present

## 2019-03-17 LAB — BASIC METABOLIC PANEL
Anion gap: 18 — ABNORMAL HIGH (ref 5–15)
BUN: 88 mg/dL — ABNORMAL HIGH (ref 8–23)
CO2: 20 mmol/L — ABNORMAL LOW (ref 22–32)
Calcium: 7 mg/dL — ABNORMAL LOW (ref 8.9–10.3)
Chloride: 90 mmol/L — ABNORMAL LOW (ref 98–111)
Creatinine, Ser: 11.56 mg/dL — ABNORMAL HIGH (ref 0.44–1.00)
GFR calc Af Amer: 3 mL/min — ABNORMAL LOW (ref >60)
GFR calc non Af Amer: 3 mL/min — ABNORMAL LOW (ref >60)
Glucose, Bld: 203 mg/dL — ABNORMAL HIGH (ref 70–99)
Potassium: 5.7 mmol/L — ABNORMAL HIGH (ref 3.5–5.1)
Sodium: 128 mmol/L — ABNORMAL LOW (ref 135–145)

## 2019-03-17 LAB — CBC
HCT: 24.9 % — ABNORMAL LOW (ref 36.0–46.0)
Hemoglobin: 8 g/dL — ABNORMAL LOW (ref 12.0–15.0)
MCH: 27.5 pg (ref 26.0–34.0)
MCHC: 32.1 g/dL (ref 30.0–36.0)
MCV: 85.6 fL (ref 80.0–100.0)
Platelets: 185 10*3/uL (ref 150–400)
RBC: 2.91 MIL/uL — ABNORMAL LOW (ref 3.87–5.11)
RDW: 14.7 % (ref 11.5–15.5)
WBC: 13.8 10*3/uL — ABNORMAL HIGH (ref 4.0–10.5)
nRBC: 0 % (ref 0.0–0.2)

## 2019-03-17 LAB — SARS CORONAVIRUS 2 BY RT PCR (HOSPITAL ORDER, PERFORMED IN ~~LOC~~ HOSPITAL LAB): SARS Coronavirus 2: NEGATIVE

## 2019-03-17 LAB — IRON AND TIBC
Iron: 31 ug/dL (ref 28–170)
Saturation Ratios: 12 % (ref 10.4–31.8)
TIBC: 270 ug/dL (ref 250–450)
UIBC: 239 ug/dL

## 2019-03-17 LAB — GLUCOSE, CAPILLARY
Glucose-Capillary: 90 mg/dL (ref 70–99)
Glucose-Capillary: 282 mg/dL — ABNORMAL HIGH (ref 70–99)
Glucose-Capillary: 226 mg/dL — ABNORMAL HIGH (ref 70–99)

## 2019-03-17 LAB — FERRITIN: Ferritin: 284 ng/mL (ref 11–307)

## 2019-03-17 LAB — LACTIC ACID, PLASMA
Lactic Acid, Venous: 1.1 mmol/L (ref 0.5–1.9)
Lactic Acid, Venous: 1 mmol/L (ref 0.5–1.9)

## 2019-03-17 LAB — HEMOGLOBIN A1C
Hgb A1c MFr Bld: 5.9 % — ABNORMAL HIGH (ref 4.8–5.6)
Mean Plasma Glucose: 122.63 mg/dL

## 2019-03-17 MED ORDER — SODIUM CHLORIDE 0.9 % IV BOLUS
500.0000 mL | Freq: Once | INTRAVENOUS | Status: AC
  Administered 2019-03-17: 15:00:00 500 mL via INTRAVENOUS

## 2019-03-17 MED ORDER — PHENOL 1.4 % MT LIQD
2.0000 | OROMUCOSAL | Status: DC | PRN
  Administered 2019-03-18 – 2019-03-19 (×3): 2 via OROMUCOSAL
  Filled 2019-03-17: qty 177

## 2019-03-17 MED ORDER — INSULIN ASPART 100 UNIT/ML ~~LOC~~ SOLN
0.0000 [IU] | Freq: Every day | SUBCUTANEOUS | Status: DC
  Administered 2019-03-17: 2 [IU] via SUBCUTANEOUS
  Filled 2019-03-17: qty 1

## 2019-03-17 MED ORDER — AZITHROMYCIN 250 MG PO TABS
500.0000 mg | ORAL_TABLET | Freq: Every day | ORAL | Status: AC
  Administered 2019-03-17: 500 mg via ORAL
  Filled 2019-03-17: qty 2

## 2019-03-17 MED ORDER — TRAZODONE HCL 100 MG PO TABS
100.0000 mg | ORAL_TABLET | Freq: Every day | ORAL | Status: DC
  Administered 2019-03-17 – 2019-03-21 (×5): 100 mg via ORAL
  Filled 2019-03-17 (×5): qty 1

## 2019-03-17 MED ORDER — OCUVITE-LUTEIN PO CAPS
1.0000 | ORAL_CAPSULE | Freq: Every day | ORAL | Status: DC
  Administered 2019-03-18 – 2019-03-22 (×5): 1 via ORAL
  Filled 2019-03-17 (×5): qty 1

## 2019-03-17 MED ORDER — ACETAMINOPHEN 325 MG PO TABS
650.0000 mg | ORAL_TABLET | Freq: Four times a day (QID) | ORAL | Status: DC | PRN
  Administered 2019-03-17 – 2019-03-21 (×5): 650 mg via ORAL
  Filled 2019-03-17 (×5): qty 2

## 2019-03-17 MED ORDER — ONDANSETRON HCL 4 MG PO TABS: 4.0000 mg | ORAL_TABLET | Freq: Four times a day (QID) | ORAL | Status: DC | PRN

## 2019-03-17 MED ORDER — PNEUMOCOCCAL VAC POLYVALENT 25 MCG/0.5ML IJ INJ: 0.5000 mL | INJECTION | INTRAMUSCULAR | Status: DC

## 2019-03-17 MED ORDER — SODIUM BICARBONATE 8.4 % IV SOLN
50.0000 meq | Freq: Once | INTRAVENOUS | Status: AC
  Administered 2019-03-17: 50 meq via INTRAVENOUS
  Filled 2019-03-17: qty 50

## 2019-03-17 MED ORDER — GUAIFENESIN-DM 100-10 MG/5ML PO SYRP
15.0000 mL | ORAL_SOLUTION | ORAL | Status: DC | PRN
  Administered 2019-03-18 (×2): 15 mL via ORAL
  Filled 2019-03-17 (×2): qty 15

## 2019-03-17 MED ORDER — SODIUM CHLORIDE 0.9 % IV SOLN
1.0000 g | INTRAVENOUS | Status: DC
  Administered 2019-03-17: 1 g via INTRAVENOUS
  Filled 2019-03-17: qty 1
  Filled 2019-03-17: qty 10

## 2019-03-17 MED ORDER — ACETAMINOPHEN 650 MG RE SUPP: 650.0000 mg | Freq: Four times a day (QID) | RECTAL | Status: DC | PRN

## 2019-03-17 MED ORDER — MAGNESIUM OXIDE 400 (241.3 MG) MG PO TABS
400.0000 mg | ORAL_TABLET | Freq: Two times a day (BID) | ORAL | Status: DC
  Administered 2019-03-18 – 2019-03-22 (×9): 400 mg via ORAL
  Filled 2019-03-17 (×9): qty 1

## 2019-03-17 MED ORDER — INSULIN ASPART 100 UNIT/ML IV SOLN
10.0000 [IU] | Freq: Once | INTRAVENOUS | Status: AC
  Administered 2019-03-17: 10 [IU] via INTRAVENOUS
  Filled 2019-03-17: qty 0.1

## 2019-03-17 MED ORDER — BUPROPION HCL ER (SR) 100 MG PO TB12
100.0000 mg | ORAL_TABLET | Freq: Every day | ORAL | Status: DC
  Administered 2019-03-18 – 2019-03-22 (×5): 100 mg via ORAL
  Filled 2019-03-17 (×5): qty 1

## 2019-03-17 MED ORDER — DEXTROSE 50 % IV SOLN
25.0000 g | Freq: Once | INTRAVENOUS | Status: AC
  Administered 2019-03-17: 25 g via INTRAVENOUS
  Filled 2019-03-17: qty 50

## 2019-03-17 MED ORDER — RENA-VITE PO TABS: 1.0000 | ORAL_TABLET | Freq: Every day | ORAL | Status: DC

## 2019-03-17 MED ORDER — VANCOMYCIN HCL IN DEXTROSE 1-5 GM/200ML-% IV SOLN
1000.0000 mg | Freq: Once | INTRAVENOUS | Status: DC
  Filled 2019-03-17: qty 200

## 2019-03-17 MED ORDER — INSULIN ASPART 100 UNIT/ML ~~LOC~~ SOLN
0.0000 [IU] | Freq: Three times a day (TID) | SUBCUTANEOUS | Status: DC
  Administered 2019-03-18: 1 [IU] via SUBCUTANEOUS
  Administered 2019-03-18: 5 [IU] via SUBCUTANEOUS
  Administered 2019-03-19: 3 [IU] via SUBCUTANEOUS
  Administered 2019-03-20: 1 [IU] via SUBCUTANEOUS
  Administered 2019-03-20 (×2): 2 [IU] via SUBCUTANEOUS
  Administered 2019-03-21: 7 [IU] via SUBCUTANEOUS
  Administered 2019-03-21 – 2019-03-22 (×2): 2 [IU] via SUBCUTANEOUS
  Filled 2019-03-17 (×9): qty 1

## 2019-03-17 MED ORDER — ONDANSETRON HCL 4 MG/2ML IJ SOLN
4.0000 mg | Freq: Four times a day (QID) | INTRAMUSCULAR | Status: DC | PRN
  Administered 2019-03-17 – 2019-03-21 (×2): 4 mg via INTRAVENOUS
  Filled 2019-03-17 (×2): qty 2

## 2019-03-17 MED ORDER — VITAMIN B-12 1000 MCG PO TABS
500.0000 ug | ORAL_TABLET | Freq: Every day | ORAL | Status: DC
  Administered 2019-03-18 – 2019-03-22 (×5): 500 ug via ORAL
  Filled 2019-03-17 (×5): qty 1

## 2019-03-17 MED ORDER — FLUOXETINE HCL 20 MG PO CAPS
40.0000 mg | ORAL_CAPSULE | Freq: Every day | ORAL | Status: DC
  Administered 2019-03-18 – 2019-03-22 (×5): 40 mg via ORAL
  Filled 2019-03-17 (×5): qty 2

## 2019-03-17 MED ORDER — SODIUM CHLORIDE 0.9% FLUSH
3.0000 mL | Freq: Once | INTRAVENOUS | Status: AC
  Administered 2019-03-17: 3 mL via INTRAVENOUS

## 2019-03-17 MED ORDER — AZITHROMYCIN 250 MG PO TABS
250.0000 mg | ORAL_TABLET | Freq: Every day | ORAL | Status: DC
  Administered 2019-03-18: 250 mg via ORAL
  Filled 2019-03-17: qty 1

## 2019-03-17 MED ORDER — MIDODRINE HCL 5 MG PO TABS
10.0000 mg | ORAL_TABLET | Freq: Three times a day (TID) | ORAL | Status: DC
  Administered 2019-03-17 – 2019-03-22 (×14): 10 mg via ORAL
  Filled 2019-03-17 (×17): qty 2

## 2019-03-17 MED ORDER — DEXTROSE 250 MG/ML IV SOLN
25.0000 g | Freq: Once | INTRAVENOUS | Status: DC
  Filled 2019-03-17: qty 100

## 2019-03-17 MED ORDER — HEPARIN SODIUM (PORCINE) 5000 UNIT/ML IJ SOLN: 5000.0000 [IU] | Freq: Three times a day (TID) | INTRAMUSCULAR | Status: DC

## 2019-03-17 MED ORDER — CALCIUM GLUCONATE-NACL 1-0.675 GM/50ML-% IV SOLN
1.0000 g | Freq: Once | INTRAVENOUS | Status: AC
  Administered 2019-03-17: 19:00:00 1000 mg via INTRAVENOUS
  Filled 2019-03-17: qty 50

## 2019-03-17 MED ORDER — IPRATROPIUM-ALBUTEROL 0.5-2.5 (3) MG/3ML IN SOLN
3.0000 mL | Freq: Four times a day (QID) | RESPIRATORY_TRACT | Status: DC
  Administered 2019-03-18 (×2): 3 mL via RESPIRATORY_TRACT
  Filled 2019-03-17 (×2): qty 3

## 2019-03-17 MED ORDER — LACTULOSE 10 GM/15ML PO SOLN
10.0000 g | Freq: Two times a day (BID) | ORAL | Status: DC
  Administered 2019-03-17 – 2019-03-21 (×9): 10 g via ORAL
  Filled 2019-03-17 (×10): qty 30

## 2019-03-17 MED ORDER — VITAMIN D 25 MCG (1000 UNIT) PO TABS
2000.0000 [IU] | ORAL_TABLET | Freq: Every day | ORAL | Status: DC
  Administered 2019-03-18 – 2019-03-22 (×5): 2000 [IU] via ORAL
  Filled 2019-03-17 (×5): qty 2

## 2019-03-17 MED ORDER — SODIUM ZIRCONIUM CYCLOSILICATE 10 G PO PACK
10.0000 g | PACK | Freq: Every day | ORAL | Status: DC
  Administered 2019-03-17 – 2019-03-18 (×2): 10 g via ORAL
  Filled 2019-03-17 (×2): qty 1

## 2019-03-17 NOTE — H&P (Signed)
[facility] NAME: [name]    MR#:  [id number]  DATE OF BIRTH:  1941-08-13  DATE OF ADMISSION:  03/17/2019  PRIMARY CARE PHYSICIAN: Caryl Bis, MD   REQUESTING/REFERRING PHYSICIAN: Dr Conni Slipper  CHIEF COMPLAINT:   Chief Complaint  Patient presents with  . Cough  . Weakness    HISTORY OF PRESENT ILLNESS:  [name]  is a 78 y.o. female with a known history of end-stage renal disease training to do peritoneal dialysis.  She is coming in with weakness and cough.  Patient states that she has been taking her medicine and felt like 1 of her pills got caught in her throat.  Her throat hurts when she swallows.  Her ear is been bothersome.  She complains of hip bursitis.  In speaking with the patient's daughter they have been training to do the peritoneal dialysis but gave her off on Friday.  Patient states that she has been coughing and wheezing and bringing up some clear phlegm.  Patient states that she also has some abdominal pain some nausea vomiting.  Hospitalist services were contacted for admission when her potassium was high and her sodium was low.  PAST MEDICAL HISTORY:   Past Medical History:  Diagnosis Date  . 10 year risk of MI or stroke 7.5% or greater   . Anemia   . Ascites   . Bursitis   . CKD (chronic kidney disease)   . Diabetes mellitus ([place])   . Esophageal varices ([place])   . ESRD (end stage renal disease) ([place]) 12/2018   to start dialysis  . FHx: migraine headaches   . Gastritis and duodenitis   . GERD (gastroesophageal reflux disease)   . Hiatal hernia   . Hypercholesterolemia   . Hypertension   . Kidney stone   . Liver cirrhosis ([place])   . Melena   . Portal hypertension ([place])   . Renal cell carcinoma ([place])   . Renal lithiasis   . Takotsubo cardiomyopathy 06/2013    PAST SURGICAL HISTORY:   Past Surgical History:  Procedure Laterality Date  . ABDOMINAL HYSTERECTOMY    .  BACK SURGERY    . CHOLECYSTECTOMY    . COLONOSCOPY  06/15/2005   RMR: Minimal internal hemorrhoids. Otherwise normal rectum/Normal colon  . COLONOSCOPY    12/14/2001   RMR: Internal hemorrhage, otherwise, normal rectum/ Polyps at 30 and 35 cm resected; the remainder of colonic mucosa appeared normal  . COLONOSCOPY  05/2009   RMR: minimal hemorrhoids, sigmoid hyperplastic polyp  . COLONOSCOPY N/A 10/24/2013   [id number] colonic polyps removed as described above/Colonic diverticulosis  . ESOPHAGEAL VARICE LIGATION  12/2018   [facility]  . ESOPHAGOGASTRODUODENOSCOPY  06/12/2007   OZD:GUYQIH esophagus/patulous EG junction, moderate sized  hiatal hernia, otherwise normal stomach, D1, D2  . ESOPHAGOGASTRODUODENOSCOPY N/A 10/24/2013   RMR: Small hiatal hernia; otherwise, normal EGD  . HIP SURGERY     right  . LEFT HEART CATHETERIZATION WITH CORONARY ANGIOGRAM N/A 07/02/2013   Procedure: LEFT HEART CATHETERIZATION WITH CORONARY ANGIOGRAM;  Surgeon: Josue Hector, MD;  Location[facility] LAB;  Service: Cardiovascular;  Laterality: N/A;  . PARTIAL HYSTERECTOMY    . TENCKHOFF CATHETER INSERTION      SOCIAL HISTORY:   Social History   Tobacco Use  . Smoking status: Former Smoker    Packs/day: 1.00    Years: 43.00    Pack years: 43.00    Types: Cigarettes  Start date: 08/31/1959    Quit date: 08/30/2002    Years since quitting: 16.5  . Smokeless tobacco: Never Used  . Tobacco comment: Quit x 10 years  Substance Use Topics  . Alcohol use: No    FAMILY HISTORY:   Family History  Problem Relation Age of Onset  . Pancreatic cancer Father 32  . Colon cancer Mother        late 12s    DRUG ALLERGIES:   Allergies  Allergen Reactions  . Codeine Other (See Comments)    Reaction unknown     REVIEW OF SYSTEMS:  CONSTITUTIONAL: No fever.positive for chills.  Positive for weight loss.  Positive for fatigue and weakness.  EYES: No blurred or double vision.  EARS, NOSE, AND THROAT: No  tinnitus.  Positive for left ear pain.  Positive for sore throat and dysphasia. RESPIRATORY: No cough, shortness of breath, wheezing or hemoptysis.  CARDIOVASCULAR: No chest pain, orthopnea, edema.  GASTROINTESTINAL: Positive for nausea, vomiting, diarrhea and abdominal pain. No blood in bowel movements GENITOURINARY: No dysuria, hematuria.  ENDOCRINE: No polyuria, nocturia,  HEMATOLOGY: No anemia, easy bruising or bleeding SKIN: No rash or lesion. MUSCULOSKELETAL: Positive for hip pain.   NEUROLOGIC: No tingling, numbness, weakness.  PSYCHIATRY: No anxiety or depression.   MEDICATIONS AT HOME:   Prior to Admission medications   Medication Sig Start Date End Date Taking? Authorizing Provider  acetaminophen (TYLENOL) 500 MG tablet Take 500 mg by mouth every 6 (six) hours as needed for mild pain or fever.   Yes [provider]  B Complex-C-Folic Acid (NEPHRO VITAMINS) 0.8 MG TABS Take 1 tablet by mouth daily. 01/19/19  Yes [provider]  buPROPion (WELLBUTRIN SR) 100 MG 12 hr tablet Take 100 mg by mouth daily.    Yes [provider]  cholecalciferol (VITAMIN D) 1000 UNITS tablet Take 2,000 Units by mouth daily.   Yes [provider]  ciprofloxacin (CIPRO) 250 MG tablet Take 250 mg by mouth daily. 02/08/19  Yes [provider]  FLUoxetine (PROZAC) 40 MG capsule Take 40 mg by mouth daily.   Yes [provider]  lactulose (CHRONULAC) 10 GM/15ML solution Take 10 g by mouth 2 (two) times daily.   Yes [provider]  magnesium oxide (MAG-OX) 400 MG tablet Take 400 mg by mouth 2 (two) times daily.   Yes [provider]  midodrine (PROAMATINE) 10 MG tablet Take 10 mg by mouth 3 (three) times daily after meals. 02/26/19  Yes [provider]  Multiple Vitamins-Minerals (PRESERVISION AREDS) TABS Take 1 tablet by mouth daily.    Yes [provider]  nystatin (MYCOSTATIN) 100000 UNIT/ML suspension Take 5 mLs by mouth  3 (three) times daily. 02/26/19  Yes [provider]  pantoprazole (PROTONIX) 40 MG tablet Take 40 mg by mouth 2 (two) times daily. 02/26/19  Yes [provider]  sulfamethoxazole-trimethoprim (BACTRIM DS) 800-160 MG tablet Take 1 tablet by mouth every other day. 03/15/19  Yes [provider]  torsemide (DEMADEX) 20 MG tablet Take 40 mg by mouth daily.  02/28/19  Yes [provider]  traZODone (DESYREL) 50 MG tablet Take 100 mg by mouth at bedtime.    Yes [provider]  vitamin B-12 (CYANOCOBALAMIN) 500 MCG tablet Take 500 mcg by mouth daily.   Yes [provider]      VITAL SIGNS:  Blood pressure (!) 96/53, pulse 82, temperature 98.5 F (36.9 C), temperature source Oral, resp. rate 16, height  5\' 2"  (1.575 m), weight 42.2 kg, SpO2 98 %.  PHYSICAL EXAMINATION:  GENERAL:  78 y.o.-year-old patient lying in the bed with no acute distress.  EYES: Pupils equal, round, reactive to light and accommodation. No scleral icterus. Extraocular muscles intact.  HEENT: Head atraumatic, normocephalic. Oropharynx and nasopharynx clear.  NECK:  Supple, no jugular venous distention. No thyroid enlargement, no tenderness.  LUNGS: Decreased breath sounds bilaterally, positive wheezing throughout entire lung field.  No rales,rhonchi or crepitation. No use of accessory muscles of respiration.  CARDIOVASCULAR: S1, S2 normal. No murmurs, rubs, or gallops.  ABDOMEN: Soft, tender, nondistended. Bowel sounds present. No organomegaly or mass.  EXTREMITIES: No pedal edema, cyanosis, or clubbing.  NEUROLOGIC: Cranial nerves II through XII are intact. Muscle strength 5/5 in all extremities. Sensation intact. Gait not checked.  PSYCHIATRIC: The patient is alert and oriented x 3.  SKIN: No rash, lesion, or ulcer.   LABORATORY PANEL:   CBC Recent Labs  Lab 03/17/19 1130  WBC 13.8*  HGB 8.0*  HCT 24.9*  PLT 185    ------------------------------------------------------------------------------------------------------------------  Chemistries  Recent Labs  Lab 03/17/19 1130  NA 128*  K 5.7*  CL 90*  CO2 20*  GLUCOSE 203*  BUN 88*  CREATININE 11.56*  CALCIUM 7.0*   ------------------------------------------------------------------------------------------------------------------    RADIOLOGY:  Dg Chest Port 1 View  Result Date: 03/17/2019 CLINICAL DATA:  Shortness of breath, cough. EXAM: PORTABLE CHEST 1 VIEW COMPARISON:  Radiograph of Jan 10, 2019. FINDINGS: The heart size and mediastinal contours are within normal limits. Both lungs are clear. No pneumothorax or pleural effusion is noted. Right internal jugular dialysis catheter is noted with distal tip in expected position of right atrium. Atherosclerosis of abdominal aorta is noted. The visualized skeletal structures are unremarkable. IMPRESSION: No active disease. Aortic Atherosclerosis (ICD10-I70.0). Electronically Signed   By: Marijo Conception M.D.   On: 03/17/2019 14:44    EKG:   Normal sinus rhythm 81 bpm, Q waves inferiorly and anteriorly  IMPRESSION AND PLAN:   1.  Hyperkalemia.  Treat with Lokelma, IV calcium, IV bicarb, IV insulin and D50.  Case discussed with nephrology. 2.  Abdominal pain.  With new peritoneal dialysis catheter.  We will send off culture and sensitivity.  Could be an SBP.  We will give Rocephin. 3.  Small wound growing staph aureus as per nephrology will put on IV vancomycin. 4.  Wheeze and possible aspiration.  We will repeat a chest x-ray tomorrow.  Will continue Rocephin and Zithromax. 5.  End-stage renal disease.  Patient training to do peritoneal dialysis.  Still has hemodialysis catheter in her right chest.  With the patient having cirrhosis she may not be the best candidate for peritoneal dialysis. 6.  Liver cirrhosis on lactulose 7.  Relative hypotension.  Hold Coreg. 8.  History of congestive heart  failure and Takotsubo cardiomyopathy.  No signs of congestive heart failure at this time. 9.  Type 2 diabetes mellitus put on sliding scale. 10.  Anemia of chronic disease with history of esophageal varices.  Continue to follow hemoglobin.  Guaiac stools.    All the records are reviewed and case discussed with ED provider. Management plans discussed with the patient, family and they are in agreement.  CODE STATUS: DNR  TOTAL TIME TAKING CARE OF THIS PATIENT: 50 minutes.    Loletha Grayer M.D on 03/17/2019 at 5:23 PM  Between 7am to 6pm - Pager - [phone]  After 6pm call admission pager [phone]  [facility]  [phone]  CC: Primary care physician; Caryl Bis, MD

## 2019-03-17 NOTE — ED Provider Notes (Signed)
[facility] Emergency Department Provider Note  ____________________________________________  Time seen: Approximately 2:21 PM  I have reviewed the triage vital signs and the nursing notes.   HISTORY  Chief Complaint Cough and Weakness    HPI [name] is a 78 y.o. female who presents the emergency department complaining of malaise, chills, weakness, sore throat, cough, shortness of breath.  Patient is a dialysis patient going Monday, Wednesday, Friday.  She has not missed any of her dialysis appointments.  Patient reports that she started feeling bad 2 days ago, worse yesterday and today has progressively gotten worse.  Patient denies any headache, neck pain or stiffness, chest pain, abdominal pain, nausea vomiting, diarrhea or constipation.  No dysuria, polyuria, hematuria.  Patient reports that she has very low urinary output.  Patient has a history of anemia, chronic kidney disease, diabetes, end-stage renal disease, GERD, hypercholesterolemia, hypertension, cirrhosis, portal hypertension, cardiomyopathy.         Past Medical History:  Diagnosis Date  . 10 year risk of MI or stroke 7.5% or greater   . Anemia   . Ascites   . Bursitis   . CKD (chronic kidney disease)   . Diabetes mellitus ([place])   . Esophageal varices ([place])   . ESRD (end stage renal disease) ([place]) 12/2018   to start dialysis  . FHx: migraine headaches   . Gastritis and duodenitis   . GERD (gastroesophageal reflux disease)   . Hiatal hernia   . Hypercholesterolemia   . Hypertension   . Kidney stone   . Liver cirrhosis ([place])   . Melena   . Portal hypertension ([place])   . Renal cell carcinoma ([place])   . Renal lithiasis   . Takotsubo cardiomyopathy 06/2013    Patient Active Problem List   Diagnosis Date Noted  . Abnormal thyroid blood test 11/07/2018  . Uncontrolled type 2 diabetes mellitus with hyperglycemia ([place]) 07/25/2018  . Hyperthyroidism 07/05/2018  . Melena  10/08/2013  . Cardiomyopathy ([place]) 07/12/2013  . Physical deconditioning 07/06/2013  . NSTEMI (non-ST elevated myocardial infarction) ([place]) 07/02/2013  . Community acquired pneumonia 07/02/2013  . Sepsis(995.91) 07/02/2013  . Acute respiratory failure ([place]) 07/02/2013  . Acute systolic CHF (congestive heart failure) ([place]) 07/02/2013  . 10 year risk of MI or stroke 7.5% or greater   . HEMOCCULT POSITIVE STOOL 05/15/2009  . ANEMIA, IRON DEFICIENCY, HX OF 05/15/2009  . DM 05/14/2009  . HYPERCHOLESTEROLEMIA 05/14/2009  . MIGRAINE HEADACHE 05/14/2009  . HYPERTENSION 05/14/2009  . GERD 05/14/2009  . CHOLELITHIASIS 05/14/2009  . NEPHROLITHIASIS 05/14/2009  . BURSITIS 05/14/2009    Past Surgical History:  Procedure Laterality Date  . CHOLECYSTECTOMY    . COLONOSCOPY  06/15/2005   RMR: Minimal internal hemorrhoids. Otherwise normal rectum/Normal colon  . COLONOSCOPY    12/14/2001   RMR: Internal hemorrhage, otherwise, normal rectum/ Polyps at 30 and 35 cm resected; the remainder of colonic mucosa appeared normal  . COLONOSCOPY  05/2009   RMR: minimal hemorrhoids, sigmoid hyperplastic polyp  . COLONOSCOPY N/A 10/24/2013   DGU:[id number] colonic polyps removed as described above/Colonic diverticulosis  . ESOPHAGEAL VARICE LIGATION  12/2018   [facility]  . ESOPHAGOGASTRODUODENOSCOPY  06/12/2007   VZD:GLOVFI esophagus/patulous EG junction, moderate sized  hiatal hernia, otherwise normal stomach, D1, D2  . ESOPHAGOGASTRODUODENOSCOPY N/A 10/24/2013   RMR: Small hiatal hernia; otherwise, normal EGD  . HIP SURGERY     right  . LEFT HEART CATHETERIZATION WITH CORONARY ANGIOGRAM N/A 07/02/2013   Procedure: LEFT  HEART CATHETERIZATION WITH CORONARY ANGIOGRAM;  Surgeon: Josue Hector, MD;  Location: [facility] CATH LAB;  Service: Cardiovascular;  Laterality: N/A;  . PARTIAL HYSTERECTOMY    . TENCKHOFF CATHETER INSERTION      Prior to Admission medications   Medication Sig Start Date End Date Taking?  Authorizing Provider  buPROPion (WELLBUTRIN SR) 150 MG 12 hr tablet Take 150 mg by mouth daily.    [provider]  carvedilol (COREG) 6.25 MG tablet TAKE ONE TABLET BY MOUTH TWICE A DAY WITH A MEAL 12/22/18   Burtis Junes, NP  cholecalciferol (VITAMIN D) 1000 UNITS tablet Take 2,000 Units by mouth daily.    [provider]  FLUoxetine (PROZAC) 40 MG capsule Take 40 mg by mouth daily.    [provider]  furosemide (LASIX) 20 MG tablet TAKE ONE (1) TABLET EACH DAY 09/19/14   Sherren Mocha, MD  insulin degludec (TRESIBA) 100 UNIT/ML SOPN FlexTouch Pen Inject 0.18 mLs (18 Units total) into the skin daily with breakfast. 08/08/18   Nida, Marella Chimes, MD  Insulin Pen Needle (BD PEN NEEDLE NANO U/F) 32G X 4 MM MISC 1 each by Does not apply route 4 (four) times daily. 07/25/18   Cassandria Anger, MD  Multiple Vitamins-Minerals (PRESERVISION AREDS PO) Take by mouth.    [provider]  omeprazole (PRILOSEC) 20 MG capsule Take 20 mg by mouth daily.     [provider]  ondansetron (ZOFRAN ODT) 4 MG disintegrating tablet Take 1 tablet (4 mg total) by mouth every 8 (eight) hours as needed for nausea. 01/21/18   Tanna Furry, MD  Probiotic Product (Bells) Take 1 tablet by mouth daily.    [provider]  traMADol (ULTRAM) 50 MG tablet Take 50 mg by mouth every 6 (six) hours as needed.    [provider]  traZODone (DESYREL) 50 MG tablet Take 50 mg by mouth at bedtime.     [provider]  Zinc 50 MG TABS Take by mouth as directed.    [provider]    Allergies Codeine  Family History  Problem Relation Age of Onset  . Pancreatic cancer Father 49  . Colon cancer Mother        late 27s    Social History Social History   Tobacco Use  . Smoking status: Former Smoker    Packs/day: 1.00    Years: 43.00    Pack years: 43.00    Types: Cigarettes    Start date: 08/31/1959    Quit date:  08/30/2002    Years since quitting: 16.5  . Smokeless tobacco: Never Used  . Tobacco comment: Quit x 10 years  Substance Use Topics  . Alcohol use: No  . Drug use: No     Review of Systems  Constitutional: No fever but positive for chills.  Eye Positive for malaise and weaknesss: No visual changes. No discharge ENT: Positive for sore throat Cardiovascular: no chest pain. Respiratory: Positive for cough and shortness of breath. Gastrointestinal: No abdominal pain.  No nausea, no vomiting.  No diarrhea.  No constipation. Genitourinary: Negative for dysuria. No hematuria Musculoskeletal: Negative for musculoskeletal pain. Skin: Negative for rash, abrasions, lacerations, ecchymosis. Neurological: Negative for headaches, focal weakness or numbness. 10-point ROS otherwise negative.  ____________________________________________   PHYSICAL EXAM:  VITAL SIGNS: ED Triage Vitals  Enc Vitals Group     BP 03/17/19 1055 (!) 100/53     Pulse Rate 03/17/19 1055 82  Resp 03/17/19 1055 16     Temp 03/17/19 1055 98.5 F (36.9 C)     Temp Source 03/17/19 1055 Oral     SpO2 03/17/19 1055 98 %     Weight 03/17/19 1056 93 lb (42.2 kg)     Height 03/17/19 1056 5\' 2"  (1.575 m)     Head Circumference --      Peak Flow --      Pain Score 03/17/19 1112 8     Pain Loc --      Pain Edu? --      Excl. in [place]? --      Constitutional: Alert and oriented.  Moderately ill appearing and in no acute distress. Eyes: Conjunctivae are normal. PERRL. EOMI. Head: Atraumatic. ENT:      Ears:       Nose: No congestion/rhinnorhea.      Mouth/Throat: Mucous membranes are moderately dry.  Oropharynx is mildly erythematous but nonedematous.  Uvula is midline. Neck: No stridor.  Neck is supple full range of movement Hematological/Lymphatic/Immunilogical: No cervical lymphadenopathy. Cardiovascular: Normal rate, regular rhythm. Normal S1 and S2.  Good peripheral circulation. Respiratory: Normal respiratory  effort without tachypnea or retractions. Lungs with mild expiratory wheeze and mild rhonchi to the right lung field.  No adventitious lung sounds on the left. Kermit Balo air entry to the bases with no decreased or absent breath sounds. Gastrointestinal: Bowel sounds 4 quadrants. Soft and nontender to palpation. No guarding or rigidity. No palpable masses. No distention. No CVA tenderness Musculoskeletal: Full range of motion to all extremities. No gross deformities appreciated. Neurologic:  Normal speech and language. No gross focal neurologic deficits are appreciated.  Skin:  Skin is warm, dry and intact. No rash noted. Psychiatric: Mood and affect are normal. Speech and behavior are normal. Patient exhibits appropriate insight and judgement.   ____________________________________________   LABS (all labs ordered are listed, but only abnormal results are displayed)  Labs Reviewed  BASIC METABOLIC PANEL - Abnormal; Notable for the following components:      Result Value   Sodium 128 (*)    Potassium 5.7 (*)    Chloride 90 (*)    CO2 20 (*)    Glucose, Bld 203 (*)    BUN 88 (*)    Creatinine, Ser 11.56 (*)    Calcium 7.0 (*)    GFR calc non Af Amer 3 (*)    GFR calc Af Amer 3 (*)    Anion gap 18 (*)    All other components within normal limits  CBC - Abnormal; Notable for the following components:   WBC 13.8 (*)    RBC 2.91 (*)    Hemoglobin 8.0 (*)    HCT 24.9 (*)    All other components within normal limits  SARS CORONAVIRUS 2 (HOSPITAL ORDER, [facility])  LACTIC ACID, PLASMA  URINALYSIS, COMPLETE (UACMP) WITH MICROSCOPIC  LACTIC ACID, PLASMA  CBG MONITORING, ED   ____________________________________________  EKG   ____________________________________________  RADIOLOGY I personally viewed and evaluated these images as part of my medical decision making, as well as reviewing the written report by the radiologist.  Dg Chest Port 1  View  Result Date: 03/17/2019 CLINICAL DATA:  Shortness of breath, cough. EXAM: PORTABLE CHEST 1 VIEW COMPARISON:  Radiograph of Jan 10, 2019. FINDINGS: The heart size and mediastinal contours are within normal limits. Both lungs are clear. No pneumothorax or pleural effusion is noted. Right internal jugular dialysis catheter  is noted with distal tip in expected position of right atrium. Atherosclerosis of abdominal aorta is noted. The visualized skeletal structures are unremarkable. IMPRESSION: No active disease. Aortic Atherosclerosis (ICD10-I70.0). Electronically Signed   By: Marijo Conception M.D.   On: 03/17/2019 14:44    ____________________________________________    PROCEDURES  Procedure(s) performed:    Procedures    Medications  sodium chloride flush (NS) 0.9 % injection 3 mL (has no administration in time range)  sodium chloride 0.9 % bolus 500 mL (0 mLs Intravenous Stopped 03/17/19 1627)     ____________________________________________   INITIAL IMPRESSION / ASSESSMENT AND PLAN / ED COURSE  Pertinent labs & imaging results that were available during my care of the patient were reviewed by me and considered in my medical decision making (see chart for details).  Review of the [place] CSRS was performed in accordance of the [place] prior to dispensing any controlled drugs.  Clinical Course as of Mar 17 1647  Sat Mar 17, 2019  1439 Patient presents emergency department complaining of weakness, chills, sore throat, cough, shortness of breath.  Patient presents with her daughter who is concerned that patient may have aspirated some of her pills.  Patient is a dialysis patient going Monday, Wednesday, Friday.  She denies missing any appointments.  Initial labs revealed hyponatremia at 128, slightly elevated potassium, hypocalcemia, anemia.  Elevated glucose, significant bump in her creatinine.  At this time, differential includes aspiration pneumonia, COVID-19 infection, viral  infection.  Chest x-ray ordered, urinalysis pending, COVID-19 swab and lactic acid also ordered.   [JC]    Clinical Course User Index [JC] , Charline Bills, PA-C          Patient's diagnosis is consistent with weakness with hyponatremia, hypokalemia, hypocalcemia, anemia, end-stage renal disease on dialysis.  Patient presented to the emergency department with multiple URI symptoms as well as profound weakness.  The patient's daughter was concerned that patient may have aspirated and had aspiration pneumonia.  Chest x-ray was reassuring with no findings of consolidation concerning for pneumonia..  Patient did have some mild wheezing and crackles to the right lower lung field.  Results with patient's labs reveals multiple findings that could contribute to patient's weakness to include anemia, hyponatremia, hyperkalemia, hypocalcemia.  Patient reportedly went to dialysis yesterday, today has a creatinine of 12.  And this time negative cover test.  I will contact the hospitalist for admission.  I discussed the patient's case with hospitalist who agrees to admit the patient.    ____________________________________________  FINAL CLINICAL IMPRESSION(S) / ED DIAGNOSES  Final diagnoses:  Weakness  Hyponatremia  Hyperkalemia  Hypocalcemia  Anemia due to chronic kidney disease, on chronic dialysis ([place])  ESRD (end stage renal disease) on dialysis ([place])      NEW MEDICATIONS STARTED DURING THIS VISIT:  ED Discharge Orders    None          This chart was dictated using voice recognition software/Dragon. Despite best efforts to proofread, errors can occur which can change the meaning. Any change was purely unintentional.    Darletta Moll, PA-C 03/17/19 1648    Carrie Mew, MD 03/18/19 [phone]

## 2019-03-17 NOTE — Progress Notes (Signed)
Patient ID: [name], female   DOB: February 12, 1941, 78 y.o.   MRN: [id number]  ACP note  Spoke with patient at bedside.  Later spoke with the daughter on the phone.  Patient coming in with multiple complaints including cough and weakness.  She states that she felt like she got a pill caught in her throat and her throat hurts when she swallows.  She states her left ear is been bothersome.  She complains of hip pain.  She also has some abdominal pain and nausea vomiting.  She also having coughing and wheezing.  Hospitalist services were contacted for admission because the patient had hyperkalemia and hyponatremia.  Patient also had abdominal pain.  CODE STATUS discussed and patient wishes to be a DO NOT RESUSCITATE.  Patient has multiple medical problems including end-stage renal disease, liver cirrhosis and congestive heart failure.  She also has diabetes and anemia.  Plan.  Treat hyperkalemia with Lokelma, IV calcium, IV bicarb, IV insulin and D50.  Case discussed with nephrology.  Patient has been training to do peritoneal dialysis.  Still has a PermCath in the right chest.  Patient has a small wound that is growing staph aureus so we will place on IV vancomycin.  Repeat a chest x-ray tomorrow morning.  Put on Rocephin and Zithromax.  Patient COVID-19 testing was negative.  Time spent on ACP discussion 17 minutes Dr Loletha Grayer

## 2019-03-17 NOTE — ED Notes (Signed)
ED TO INPATIENT HANDOFF REPORT  ED Nurse Name and Phone #:  Iver Nestle  S Name/Age/Gender [name] 78 y.o. female Room/Bed: ED16A/ED16A  Code Status   Code Status: Prior  Home/SNF/Other Home Patient oriented to: self, place, time and situation Is this baseline? Yes   Triage Complete: Triage complete  Chief Complaint dialysis chest congestion  Triage Note Pt to ED via [place] with daughter who states that she thinks pt may have aspirated on her pills yesterday. Pt has had cough and congestion a well as feeling very weak. Pt is in NAD>   Allergies Allergies  Allergen Reactions  . Codeine Other (See Comments)    Reaction unknown     Level of Care/Admitting Diagnosis ED Disposition    ED Disposition Condition [facility] Area: [facility] [[id number]]  Level of Care: Med-Surg [16]  Covid Evaluation: Confirmed COVID Negative  Diagnosis: Hyperkalemia [354656]  Admitting Physician: Loletha Grayer [[id number]]  Attending Physician: Loletha Grayer [phone]  Estimated length of stay: past midnight tomorrow  Certification:: I certify this patient will need inpatient services for at least 2 midnights  PT Class (Do Not Modify): Inpatient [101]  PT Acc Code (Do Not Modify): Private [1]       B Medical/Surgery History Past Medical History:  Diagnosis Date  . 10 year risk of MI or stroke 7.5% or greater   . Anemia   . Ascites   . Bursitis   . CKD (chronic kidney disease)   . Diabetes mellitus ([place])   . Esophageal varices ([place])   . ESRD (end stage renal disease) ([place]) 12/2018   to start dialysis  . FHx: migraine headaches   . Gastritis and duodenitis   . GERD (gastroesophageal reflux disease)   . Hiatal hernia   . Hypercholesterolemia   . Hypertension   . Kidney stone   . Liver cirrhosis ([place])   . Melena   . Portal hypertension ([place])   . Renal cell carcinoma ([place])   . Renal lithiasis   . Takotsubo cardiomyopathy 06/2013    Past Surgical History:  Procedure Laterality Date  . CHOLECYSTECTOMY    . COLONOSCOPY  06/15/2005   RMR: Minimal internal hemorrhoids. Otherwise normal rectum/Normal colon  . COLONOSCOPY    12/14/2001   RMR: Internal hemorrhage, otherwise, normal rectum/ Polyps at 30 and 35 cm resected; the remainder of colonic mucosa appeared normal  . COLONOSCOPY  05/2009   RMR: minimal hemorrhoids, sigmoid hyperplastic polyp  . COLONOSCOPY N/A 10/24/2013   BSW:HQPRFFMB colonic polyps removed as described above/Colonic diverticulosis  . ESOPHAGEAL VARICE LIGATION  12/2018   [facility]  . ESOPHAGOGASTRODUODENOSCOPY  06/12/2007   WGY:KZLDJT esophagus/patulous EG junction, moderate sized  hiatal hernia, otherwise normal stomach, D1, D2  . ESOPHAGOGASTRODUODENOSCOPY N/A 10/24/2013   RMR: Small hiatal hernia; otherwise, normal EGD  . HIP SURGERY     right  . LEFT HEART CATHETERIZATION WITH CORONARY ANGIOGRAM N/A 07/02/2013   Procedure: LEFT HEART CATHETERIZATION WITH CORONARY ANGIOGRAM;  Surgeon: Josue Hector, MD;  Location: [facility] CATH LAB;  Service: Cardiovascular;  Laterality: N/A;  . PARTIAL HYSTERECTOMY    . [place]       A IV Location/Drains/Wounds Patient Lines/Drains/Airways Status   Active Line/Drains/Airways    Name:   Placement date:   Placement time:   Site:   Days:   Peripheral IV 01/10/19 Left Forearm   01/10/19    0749    Forearm   66  Peripheral IV 01/10/19   01/10/19    0715    -   66   Peripheral IV 01/10/19 Right Antecubital   01/10/19    0715    Antecubital   66   Peripheral IV 03/17/19 Left Antecubital   03/17/19    1507    Antecubital   less than 1          Intake/Output Last 24 hours No intake or output data in the 24 hours ending 03/17/19 1709  Labs/Imaging Results for orders placed or performed during the hospital encounter of 03/17/19 (from the past 48 hour(s))  Basic metabolic panel     Status: Abnormal   Collection Time: 03/17/19 11:30 AM   Result Value Ref Range   Sodium 128 (L) 135 - 145 mmol/L   Potassium 5.7 (H) 3.5 - 5.1 mmol/L   Chloride 90 (L) 98 - 111 mmol/L   CO2 20 (L) 22 - 32 mmol/L   Glucose, Bld 203 (H) 70 - 99 mg/dL   BUN 88 (H) 8 - 23 mg/dL   Creatinine, Ser 11.56 (H) 0.44 - 1.00 mg/dL   Calcium 7.0 (L) 8.9 - 10.3 mg/dL   GFR calc non Af Amer 3 (L) >60 mL/min   GFR calc Af Amer 3 (L) >60 mL/min   Anion gap 18 (H) 5 - 15    Comment: Performed at [facility], [address] [place], [place] [address]  CBC     Status: Abnormal   Collection Time: 03/17/19 11:30 AM  Result Value Ref Range   WBC 13.8 (H) 4.0 - 10.5 K/uL   RBC 2.91 (L) 3.87 - 5.11 MIL/uL   Hemoglobin 8.0 (L) 12.0 - 15.0 g/dL   HCT 24.9 (L) 36.0 - 46.0 %   MCV 85.6 80.0 - 100.0 fL   MCH 27.5 26.0 - 34.0 pg   MCHC 32.1 30.0 - 36.0 g/dL   RDW 14.7 11.5 - 15.5 %   Platelets 185 150 - 400 K/uL   nRBC 0.0 0.0 - 0.2 %    Comment: Performed at [facility], [address] [place], [place] [address]  Lactic acid, plasma     Status: None   Collection Time: 03/17/19  2:54 PM  Result Value Ref Range   Lactic Acid, Venous 1.1 0.5 - 1.9 mmol/L    Comment: Performed at [facility], [address] [place], [place] [address]  SARS Coronavirus 2 ([place]- Performed in [place] hospital lab), Hosp Order     Status: None   Collection Time: 03/17/19  2:54 PM   Specimen: Nasopharyngeal Swab  Result Value Ref Range   SARS Coronavirus 2 NEGATIVE NEGATIVE    Comment: (NOTE) If result is NEGATIVE SARS-CoV-2 target nucleic acids are NOT DETECTED. The SARS-CoV-2 RNA is generally detectable in upper and lower  respiratory specimens during the acute phase of infection. The lowest  concentration of SARS-CoV-2 viral copies this assay can detect is 250  copies / mL. A negative result does not preclude SARS-CoV-2 infection  and should not be used as the sole basis for treatment or other  patient management decisions.  A  negative result may occur with  improper specimen collection / handling, submission of specimen other  than nasopharyngeal swab, presence of viral mutation(s) within the  areas targeted by this assay, and inadequate number of viral copies  (<250 copies / mL). A negative result must be combined with clinical  observations, patient history, and epidemiological information.  If result is POSITIVE SARS-CoV-2 target nucleic acids are DETECTED. The SARS-CoV-2 RNA is generally detectable in upper and lower  respiratory specimens dur ing the acute phase of infection.  Positive  results are indicative of active infection with SARS-CoV-2.  Clinical  correlation with patient history and other diagnostic information is  necessary to determine patient infection status.  Positive results do  not rule out bacterial infection or co-infection with other viruses. If result is PRESUMPTIVE POSTIVE SARS-CoV-2 nucleic acids MAY BE PRESENT.   A presumptive positive result was obtained on the submitted specimen  and confirmed on repeat testing.  While 2019 novel coronavirus  (SARS-CoV-2) nucleic acids may be present in the submitted sample  additional confirmatory testing may be necessary for epidemiological  and / or clinical management purposes  to differentiate between  SARS-CoV-2 and other Sarbecovirus currently known to infect humans.  If clinically indicated additional testing with an alternate test  methodology [phone]) is advised. The SARS-CoV-2 RNA is generally  detectable in upper and lower respiratory sp ecimens during the acute  phase of infection. The expected result is Negative. Fact Sheet for Patients:  StrictlyIdeas.no Fact Sheet for Healthcare Providers: BankingDealers.co.za This test is not yet approved or cleared by the [place] FDA and has been authorized for detection and/or diagnosis of SARS-CoV-2 by FDA under an Emergency Use  Authorization (EUA).  This EUA will remain in effect (meaning this test can be used) for the duration of the COVID-19 declaration under Section 564(b)(1) of the Act, 21 U.S.C. section 360bbb-3(b)(1), unless the authorization is terminated or revoked sooner. Performed at [facility], [address] [place], [place] [address]    Dg Chest Port 1 View  Result Date: 03/17/2019 CLINICAL DATA:  Shortness of breath, cough. EXAM: PORTABLE CHEST 1 VIEW COMPARISON:  Radiograph of Jan 10, 2019. FINDINGS: The heart size and mediastinal contours are within normal limits. Both lungs are clear. No pneumothorax or pleural effusion is noted. Right internal jugular dialysis catheter is noted with distal tip in expected position of right atrium. Atherosclerosis of abdominal aorta is noted. The visualized skeletal structures are unremarkable. IMPRESSION: No active disease. Aortic Atherosclerosis (ICD10-I70.0). Electronically Signed   By: Marijo Conception M.D.   On: 03/17/2019 14:44    Pending Labs Unresulted Labs (From admission, onward)    Start     Ordered   03/17/19 1423  Lactic acid, plasma  Now then every 2 hours,   STAT     03/17/19 1435   03/17/19 1115  Urinalysis, Complete w Microscopic  ONCE - STAT,   STAT     03/17/19 1115          Vitals/Pain Today's Vitals   03/17/19 1531 03/17/19 1600 03/17/19 1623 03/17/19 1630  BP: (!) 94/51 (!) 114/56 (!) 114/56 (!) 96/53  Pulse: 80 79 84 82  Resp: 16  16   Temp:      TempSrc:      SpO2: 100% 100% 99% 98%  Weight:      Height:      PainSc:        Isolation Precautions No active isolations  Medications Medications  sodium chloride flush (NS) 0.9 % injection 3 mL (has no administration in time range)  sodium chloride 0.9 % bolus 500 mL (0 mLs Intravenous Stopped 03/17/19 1627)    Mobility walks with person assist Low fall risk   Focused Assessments    R Recommendations: See Admitting Provider Note  Report given  to:    Additional Notes:

## 2019-03-17 NOTE — Consult Note (Signed)
Pharmacy Antibiotic Note  [name] is a 78 y.o. female admitted on 03/17/2019 with cellulitis.  Pharmacy has been consulted for Vancomycin dosing.  [name]  is a 78 y.o. female with a known history of end-stage renal disease training to do peritoneal dialysis.  Unclear whether patient will continue with PD plan or if patient will utilize hemodialysis inpatient.     Plan: Will start with Vancomycin 1g as a one time loading dose (weight-based) and follow up dialysis plan in the am.  Height: 5\' 2"  (157.5 cm) Weight: 93 lb (42.2 kg) IBW/kg (Calculated) : 50.1  Temp (24hrs), Avg:98.5 F (36.9 C), Min:98.5 F (36.9 C), Max:98.5 F (36.9 C)  Recent Labs  Lab 03/17/19 1130 03/17/19 1454  WBC 13.8*  --   CREATININE 11.56*  --   LATICACIDVEN  --  1.1    Estimated Creatinine Clearance: 2.6 mL/min (A) (by C-G formula based on SCr of 11.56 mg/dL (H)).    Allergies  Allergen Reactions  . Codeine Other (See Comments)    Reaction unknown     Antimicrobials this admission: Azithromycin 7/18 >> Ceftriaxone 7/18 >> Vancomycin 7/18 >>  Dose adjustments this admission: None  Microbiology results: Surgical/Deep Wound Cx (peritoneal cavity) 7/18 >> pending  Thank you for allowing pharmacy to be a part of this patient's care.  Lu Duffel, PharmD, BCPS Clinical Pharmacist 03/17/2019 5:49 PM

## 2019-03-17 NOTE — ED Triage Notes (Signed)
Pt to ED via [place] with daughter who states that she thinks pt may have aspirated on her pills yesterday. Pt has had cough and congestion a well as feeling very weak. Pt is in NAD>

## 2019-03-18 ENCOUNTER — Inpatient Hospital Stay: Payer: Medicare HMO

## 2019-03-18 LAB — BODY FLUID CELL COUNT WITH DIFFERENTIAL
Eos, Fluid: 0 %
Lymphs, Fluid: 59 %
Monocyte-Macrophage-Serous Fluid: 19 %
Neutrophil Count, Fluid: 22 %
Total Nucleated Cell Count, Fluid: 207 uL

## 2019-03-18 LAB — GLUCOSE, CAPILLARY
Glucose-Capillary: 123 mg/dL — ABNORMAL HIGH (ref 70–99)
Glucose-Capillary: 281 mg/dL — ABNORMAL HIGH (ref 70–99)
Glucose-Capillary: 130 mg/dL — ABNORMAL HIGH (ref 70–99)
Glucose-Capillary: 181 mg/dL — ABNORMAL HIGH (ref 70–99)

## 2019-03-18 LAB — CBC
HCT: 19.5 % — ABNORMAL LOW (ref 36.0–46.0)
Hemoglobin: 6.4 g/dL — ABNORMAL LOW (ref 12.0–15.0)
MCH: 27.8 pg (ref 26.0–34.0)
MCHC: 32.8 g/dL (ref 30.0–36.0)
MCV: 84.8 fL (ref 80.0–100.0)
Platelets: 140 10*3/uL — ABNORMAL LOW (ref 150–400)
RBC: 2.3 MIL/uL — ABNORMAL LOW (ref 3.87–5.11)
RDW: 14.6 % (ref 11.5–15.5)
WBC: 8.8 10*3/uL (ref 4.0–10.5)
nRBC: 0 % (ref 0.0–0.2)

## 2019-03-18 LAB — BASIC METABOLIC PANEL
Anion gap: 16 — ABNORMAL HIGH (ref 5–15)
BUN: 94 mg/dL — ABNORMAL HIGH (ref 8–23)
CO2: 20 mmol/L — ABNORMAL LOW (ref 22–32)
Calcium: 6.8 mg/dL — ABNORMAL LOW (ref 8.9–10.3)
Chloride: 92 mmol/L — ABNORMAL LOW (ref 98–111)
Creatinine, Ser: 12.01 mg/dL — ABNORMAL HIGH (ref 0.44–1.00)
GFR calc Af Amer: 3 mL/min — ABNORMAL LOW (ref >60)
GFR calc non Af Amer: 3 mL/min — ABNORMAL LOW (ref >60)
Glucose, Bld: 156 mg/dL — ABNORMAL HIGH (ref 70–99)
Potassium: 4.4 mmol/L (ref 3.5–5.1)
Sodium: 128 mmol/L — ABNORMAL LOW (ref 135–145)

## 2019-03-18 LAB — ABO/RH: ABO/RH(D): A POS

## 2019-03-18 LAB — VITAMIN B12: Vitamin B-12: >7500 pg/mL — ABNORMAL HIGH (ref 180–914)

## 2019-03-18 LAB — PREPARE RBC (CROSSMATCH)

## 2019-03-18 LAB — VANCOMYCIN, RANDOM: Vancomycin Rm: 13

## 2019-03-18 MED ORDER — MENTHOL 3 MG MT LOZG
1.0000 | LOZENGE | OROMUCOSAL | Status: DC | PRN
  Filled 2019-03-18: qty 9

## 2019-03-18 MED ORDER — NEPRO/CARBSTEADY PO LIQD: 237.0000 mL | Freq: Two times a day (BID) | ORAL | Status: DC

## 2019-03-18 MED ORDER — CHLORHEXIDINE GLUCONATE CLOTH 2 % EX PADS
6.0000 | MEDICATED_PAD | Freq: Every day | CUTANEOUS | Status: DC
  Administered 2019-03-21 – 2019-03-22 (×2): 6 via TOPICAL

## 2019-03-18 MED ORDER — DOXYCYCLINE HYCLATE 100 MG PO TABS
100.0000 mg | ORAL_TABLET | Freq: Two times a day (BID) | ORAL | Status: DC
  Administered 2019-03-18 – 2019-03-22 (×7): 100 mg via ORAL
  Filled 2019-03-18 (×8): qty 1

## 2019-03-18 MED ORDER — IPRATROPIUM-ALBUTEROL 0.5-2.5 (3) MG/3ML IN SOLN
3.0000 mL | Freq: Two times a day (BID) | RESPIRATORY_TRACT | Status: DC
  Administered 2019-03-18 – 2019-03-22 (×8): 3 mL via RESPIRATORY_TRACT
  Filled 2019-03-18 (×8): qty 3

## 2019-03-18 MED ORDER — RENA-VITE PO TABS
1.0000 | ORAL_TABLET | Freq: Every day | ORAL | Status: DC
  Administered 2019-03-18 – 2019-03-21 (×4): 1 via ORAL
  Filled 2019-03-18 (×4): qty 1

## 2019-03-18 MED ORDER — SODIUM CHLORIDE 0.9% IV SOLUTION: Freq: Once | INTRAVENOUS | Status: DC

## 2019-03-18 NOTE — Progress Notes (Signed)
PT Cancellation Note  Patient Details Name: KAITLAN BIN MRN: [id number] DOB: 27-Mar-1941   Cancelled Treatment:    Reason Eval/Treat Not Completed: Other (comment)(Chart reviewed and RN consulted, pt hemoglobin this AM showed a decreased to 6.4. Per PT practice guidelines PT to hold evaluation until patient is more medically appropriate.)   Lieutenant Diego PT, DPT 8:45 AM,03/18/19 [phone]

## 2019-03-18 NOTE — Progress Notes (Signed)
Md paged concerning pt coughed up approx 5cc blood tinged sputum

## 2019-03-18 NOTE — Progress Notes (Signed)
Pts BP 85/49, HR 85, SpO2 98% on RA. Pt denies SOB or weakness. Pt doesn't appear to be in any distress, but looks very weak. Alerted MD of current VS. Placed pt on 1L [place].   Fuller Mandril, RN

## 2019-03-18 NOTE — Progress Notes (Signed)
[place] [facility] [place] NAME: [name]    MR#:  [id number]  DATE OF BIRTH:  1941/04/08  SUBJECTIVE:  CHIEF COMPLAINT:   Chief Complaint  Patient presents with  . Cough  . Weakness   Came with weakness, Hyperkalemia, chocking on one of her pills.   REVIEW OF SYSTEMS:   CONSTITUTIONAL: No fever,have fatigue or weakness.  EYES: No blurred or double vision.  EARS, NOSE, AND THROAT: No tinnitus or ear pain.  RESPIRATORY: No cough, shortness of breath, wheezing or hemoptysis.  CARDIOVASCULAR: No chest pain, orthopnea, edema.  GASTROINTESTINAL: No nausea, vomiting, diarrhea or abdominal pain.  GENITOURINARY: No dysuria, hematuria.  ENDOCRINE: No polyuria, nocturia,  HEMATOLOGY: No anemia, easy bruising or bleeding SKIN: No rash or lesion. MUSCULOSKELETAL: No joint pain or arthritis.   NEUROLOGIC: No tingling, numbness, weakness.  PSYCHIATRY: No anxiety or depression.   ROS  DRUG ALLERGIES:   Allergies  Allergen Reactions  . Codeine Other (See Comments)    Reaction unknown   . Milk-Related Compounds Nausea And Vomiting    VITALS:  Blood pressure (!) 96/56, pulse 90, temperature (!) 97.5 F (36.4 C), temperature source Oral, resp. rate 15, height 5\' 2"  (1.575 m), weight 42.2 kg, SpO2 99 %.  PHYSICAL EXAMINATION:  GENERAL:  78 y.o.-year-old patient lying in the bed with no acute distress.  EYES: Pupils equal, round, reactive to light and accommodation. No scleral icterus. Extraocular muscles intact. Conjunctiva pale. HEENT: Head atraumatic, normocephalic. Oropharynx and nasopharynx clear.  NECK:  Supple, no jugular venous distention. No thyroid enlargement, no tenderness.  LUNGS: Normal breath sounds bilaterally, no wheezing, rales,rhonchi or crepitation. No use of accessory muscles of respiration.  CARDIOVASCULAR: S1, S2 normal. No murmurs, rubs, or gallops.  ABDOMEN: Soft, nontender, nondistended. Bowel sounds present. No  organomegaly or mass.  EXTREMITIES: No pedal edema, cyanosis, or clubbing.  NEUROLOGIC: Cranial nerves II through XII are intact. Muscle strength 4/5 in all extremities. Sensation intact. Gait not checked.  PSYCHIATRIC: The patient is alert and oriented x 3.  SKIN: No obvious rash, lesion, or ulcer.   Physical Exam LABORATORY PANEL:   CBC Recent Labs  Lab 03/18/19 0451  WBC 8.8  HGB 6.4*  HCT 19.5*  PLT 140*   ------------------------------------------------------------------------------------------------------------------  Chemistries  Recent Labs  Lab 03/18/19 0451  NA 128*  K 4.4  CL 92*  CO2 20*  GLUCOSE 156*  BUN 94*  CREATININE 12.01*  CALCIUM 6.8*   ------------------------------------------------------------------------------------------------------------------  Cardiac Enzymes No results for input(s): TROPONINI in the last 168 hours. ------------------------------------------------------------------------------------------------------------------  RADIOLOGY:  Dg Chest 2 View  Result Date: 03/18/2019 CLINICAL DATA:  Cough. EXAM: CHEST - 2 VIEW COMPARISON:  Radiograph of March 17, 2019. FINDINGS: The heart size and mediastinal contours are within normal limits. Both lungs are clear. Right internal jugular catheter is unchanged in position. Atherosclerosis of thoracic aorta is noted. No pneumothorax or pleural effusion is noted. The visualized skeletal structures are unremarkable. IMPRESSION: No active cardiopulmonary disease. Aortic Atherosclerosis (ICD10-I70.0). Electronically Signed   By: Marijo Conception M.D.   On: 03/18/2019 08:23   Dg Chest Port 1 View  Result Date: 03/17/2019 CLINICAL DATA:  Shortness of breath, cough. EXAM: PORTABLE CHEST 1 VIEW COMPARISON:  Radiograph of Jan 10, 2019. FINDINGS: The heart size and mediastinal contours are within normal limits. Both lungs are clear. No pneumothorax or pleural effusion is noted. Right internal jugular dialysis  catheter is noted with distal tip in expected  position of right atrium. Atherosclerosis of abdominal aorta is noted. The visualized skeletal structures are unremarkable. IMPRESSION: No active disease. Aortic Atherosclerosis (ICD10-I70.0). Electronically Signed   By: Marijo Conception M.D.   On: 03/17/2019 14:44    ASSESSMENT AND PLAN:   Active Problems:   Hyperkalemia  1.  Hyperkalemia.  Treat with Lokelma, IV calcium, IV bicarb, IV insulin and D50.  Case discussed with nephrology. Improved.  2.  Abdominal pain.  With new peritoneal dialysis catheter.  We will send off culture and sensitivity.   give Rocephin.  General fluid have 22 leukocytes so this seems to be chronic fluid. 3.  Small wound growing staph aureus as per nephrology -was given intraperitoneal vancomycin and oral Bactrim now we can give oral meds, will change to doxycycline. 4.  Wheeze and possible aspiration.  Repeat chest x-ray does not show any atelectasis.  I will stop ceftriaxone and azithromycin.   5.  End-stage renal disease.  Patient training to do peritoneal dialysis.  Still has hemodialysis catheter in her right chest.  With the patient having cirrhosis she may not be the best candidate for peritoneal dialysis. Plan for HD tomorrow. 6.  Liver cirrhosis on lactulose 7.  Relative hypotension.  Hold Coreg. 8.  History of congestive heart failure and Takotsubo cardiomyopathy.  No signs of congestive heart failure at this time. 9.  Type 2 diabetes mellitus put on sliding scale. 10.  Anemia of chronic disease with history of esophageal varices.  Continue to follow hemoglobin.  Guaiac stools.    As hemoglobin is less than 7 I will give 1 unit of transfusion today.  Discussed with nephrologist and it would be okay to give transfusion today and have dialysis tomorrow.    All the records are reviewed and case discussed with Care Management/Social Workerr. Management plans discussed with the patient, family and they are in  agreement.  CODE STATUS: DNR  TOTAL TIME TAKING CARE OF THIS PATIENT: 35 minutes.     POSSIBLE D/C IN 1-2 DAYS, DEPENDING ON CLINICAL CONDITION.   Vaughan Basta M.D on 03/18/2019   Between 7am to 6pm - Pager - [phone]  After 6pm go to [email or web address] - password [email or web address] [facility]  Office  [phone]  CC: Primary care physician; Caryl Bis, MD  Note: This dictation was prepared with Dragon dictation along with smaller phrase technology. Any transcriptional errors that result from this process are unintentional.

## 2019-03-18 NOTE — Progress Notes (Signed)
[facility], [place] 03/18/19  Subjective:  Patient known to our practice from outpatient dialysis She presented to the emergency room for generalized weakness.  She also has some cough which developed after she choked on 1 of her pills.  She says this morning that she has coughed up some blood. Outpatient, patient had staph aureus growing from the small wound close to her exit site.  She was being treated with intraperitoneal vancomycin and oral Bactrim.  At skin wound appears to be healing well.  Patient has ascites and gets abdominal fullness.  She is undergoing PD training and back up hemodialysis.   Objective:  Vital signs in last 24 hours:  Temp:  [98.1 F (36.7 C)-98.4 F (36.9 C)] 98.4 F (36.9 C) (07/19 0750) Pulse Rate:  [79-85] 85 (07/19 0750) Resp:  [16-21] 16 (07/19 0750) BP: (81-114)/(43-57) 85/49 (07/19 0750) SpO2:  [96 %-100 %] 98 % (07/19 0750)  Weight change:  Filed Weights   03/17/19 1056  Weight: 42.2 kg    Intake/Output:    Intake/Output Summary (Last 24 hours) at 03/18/2019 1123 Last data filed at 03/18/2019 1008 Gross per 24 hour  Intake 480 ml  Output 50 ml  Net 430 ml     Physical Exam: General:  Thin, cachectic, frail, laying in the bed  HEENT  moist oral mucous membranes, mild pharyngeal erythema  Neck  supple, no masses  Pulm/lungs  coarse breath sounds bilaterally, O2 by [place]  CVS/Heart  irregular rhythm  Abdomen:   Soft, distended, PD exit site nontender, small 1 inch wound near exit site is healing well.  No drainage  Extremities:  No edema  Neurologic:  Alert, able to answer all questions appropriately  Skin:  No acute rashes  Access:  Right IJ PermCath.  PD catheter       Basic Metabolic Panel:  Recent Labs  Lab 03/17/19 1130 03/18/19 0451  NA 128* 128*  K 5.7* 4.4  CL 90* 92*  CO2 20* 20*  GLUCOSE 203* 156*  BUN 88* 94*  CREATININE 11.56* 12.01*  CALCIUM 7.0* 6.8*     CBC: Recent Labs  Lab  03/17/19 1130 03/18/19 0451  WBC 13.8* 8.8  HGB 8.0* 6.4*  HCT 24.9* 19.5*  MCV 85.6 84.8  PLT 185 140*     No results found for: HEPBSAG, HEPBSAB, HEPBIGM    Microbiology:  Recent Results (from the past 240 hour(s))  SARS Coronavirus 2 (CEPHEID- Performed in [place] hospital lab), Hosp Order     Status: None   Collection Time: 03/17/19  2:54 PM   Specimen: Nasopharyngeal Swab  Result Value Ref Range Status   SARS Coronavirus 2 NEGATIVE NEGATIVE Final    Comment: (NOTE) If result is NEGATIVE SARS-CoV-2 target nucleic acids are NOT DETECTED. The SARS-CoV-2 RNA is generally detectable in upper and lower  respiratory specimens during the acute phase of infection. The lowest  concentration of SARS-CoV-2 viral copies this assay can detect is 250  copies / mL. A negative result does not preclude SARS-CoV-2 infection  and should not be used as the sole basis for treatment or other  patient management decisions.  A negative result may occur with  improper specimen collection / handling, submission of specimen other  than nasopharyngeal swab, presence of viral mutation(s) within the  areas targeted by this assay, and inadequate number of viral copies  (<250 copies / mL). A negative result must be combined with clinical  observations, patient history, and  epidemiological information. If result is POSITIVE SARS-CoV-2 target nucleic acids are DETECTED. The SARS-CoV-2 RNA is generally detectable in upper and lower  respiratory specimens dur ing the acute phase of infection.  Positive  results are indicative of active infection with SARS-CoV-2.  Clinical  correlation with patient history and other diagnostic information is  necessary to determine patient infection status.  Positive results do  not rule out bacterial infection or co-infection with other viruses. If result is PRESUMPTIVE POSTIVE SARS-CoV-2 nucleic acids MAY BE PRESENT.   A presumptive positive result was obtained  on the submitted specimen  and confirmed on repeat testing.  While 2019 novel coronavirus  (SARS-CoV-2) nucleic acids may be present in the submitted sample  additional confirmatory testing may be necessary for epidemiological  and / or clinical management purposes  to differentiate between  SARS-CoV-2 and other Sarbecovirus currently known to infect humans.  If clinically indicated additional testing with an alternate test  methodology [phone]) is advised. The SARS-CoV-2 RNA is generally  detectable in upper and lower respiratory sp ecimens during the acute  phase of infection. The expected result is Negative. Fact Sheet for Patients:  StrictlyIdeas.no Fact Sheet for Healthcare Providers: BankingDealers.co.za This test is not yet approved or cleared by the [place] FDA and has been authorized for detection and/or diagnosis of SARS-CoV-2 by FDA under an Emergency Use Authorization (EUA).  This EUA will remain in effect (meaning this test can be used) for the duration of the COVID-19 declaration under Section 564(b)(1) of the Act, 21 U.S.C. section 360bbb-3(b)(1), unless the authorization is terminated or revoked sooner. Performed at [facility], [address] [place], [place] [address]     Coagulation Studies: No results for input(s): LABPROT, INR in the last 72 hours.  Urinalysis: No results for input(s): COLORURINE, LABSPEC, PHURINE, GLUCOSEU, HGBUR, BILIRUBINUR, KETONESUR, PROTEINUR, UROBILINOGEN, NITRITE, LEUKOCYTESUR in the last 72 hours.  Invalid input(s): APPERANCEUR    Imaging: Dg Chest 2 View  Result Date: 03/18/2019 CLINICAL DATA:  Cough. EXAM: CHEST - 2 VIEW COMPARISON:  Radiograph of March 17, 2019. FINDINGS: The heart size and mediastinal contours are within normal limits. Both lungs are clear. Right internal jugular catheter is unchanged in position. Atherosclerosis of thoracic aorta is noted. No  pneumothorax or pleural effusion is noted. The visualized skeletal structures are unremarkable. IMPRESSION: No active cardiopulmonary disease. Aortic Atherosclerosis (ICD10-I70.0). Electronically Signed   By: Marijo Conception M.D.   On: 03/18/2019 08:23   Dg Chest Port 1 View  Result Date: 03/17/2019 CLINICAL DATA:  Shortness of breath, cough. EXAM: PORTABLE CHEST 1 VIEW COMPARISON:  Radiograph of Jan 10, 2019. FINDINGS: The heart size and mediastinal contours are within normal limits. Both lungs are clear. No pneumothorax or pleural effusion is noted. Right internal jugular dialysis catheter is noted with distal tip in expected position of right atrium. Atherosclerosis of abdominal aorta is noted. The visualized skeletal structures are unremarkable. IMPRESSION: No active disease. Aortic Atherosclerosis (ICD10-I70.0). Electronically Signed   By: Marijo Conception M.D.   On: 03/17/2019 14:44     Medications:   . cefTRIAXone (ROCEPHIN)  IV 1 g (03/17/19 2008)  . vancomycin     . azithromycin  250 mg Oral Daily  . buPROPion  100 mg Oral Daily  . cholecalciferol  2,000 Units Oral Daily  . feeding supplement (NEPRO CARB STEADY)  237 mL Oral BID BM  . FLUoxetine  40 mg Oral Daily  . insulin aspart  0-5 Units  Subcutaneous QHS  . insulin aspart  0-9 Units Subcutaneous TID WC  . ipratropium-albuterol  3 mL Nebulization BID  . lactulose  10 g Oral BID  . magnesium oxide  400 mg Oral BID  . midodrine  10 mg Oral TID PC  . multivitamin  1 tablet Oral QHS  . multivitamin-lutein  1 capsule Oral Daily  . pneumococcal 23 valent vaccine  0.5 mL Intramuscular Tomorrow-1000  . sodium zirconium cyclosilicate  10 g Oral Daily  . traZODone  100 mg Oral QHS  . vitamin B-12  500 mcg Oral Daily   acetaminophen **OR** acetaminophen, guaiFENesin-dextromethorphan, menthol-cetylpyridinium, ondansetron **OR** ondansetron (ZOFRAN) IV, phenol  Assessment/ Plan:  78 y.o. Caucasian female with end-stage renal disease,  cirrhosis with ascites, portal hypertension, history of renal cell cancer, cardiomyopathy, esophageal varices, diabetes presents for generalized weakness, cough, hemoptysis  [facility]/PD  1.  End-stage renal disease, on hemodialysis and Patient is undergoing PD training as outpatient She is now admitted for cough, possible aspiration pneumonia Currently being treated with IV Rocephin, vancomycin and azithromycin Patient has chylous ascites.  PD fluid cell count shows total WBC count of 207 with 59% lymphocytes and only 22% neutrophils.  This is consistent with chronic inflammation not Acute peritonitis.  Patient is already getting antibiotics for other conditions. Next hemodialysis planned for Monday  2.  Skin infection Wound culture as outpatient was growing MRSA from July 13 She was being treated with intraperitoneal vancomycin starting July 13 Wound is healing well.  Consider switching to oral antibiotics  3.  Cough, hemoptysis, generalized weakness Possible underlying pneumonia Monitor closely.  May need a CT of the chest if not improving soon  4.  Anemia Hospitalist team is planning blood transfusion  5.  Hyperkalemia Corrected with Lokelma and shifting measures Expected to stay corrected with dialysis tomorrow     LOS: 1 Rolando Whitby 7/19/202011:23 [place], [place]  Note: This note was prepared with Dragon dictation. Any transcription errors are unintentional

## 2019-03-18 NOTE — Progress Notes (Signed)
Initial Nutrition Assessment  RD working remotely.  DOCUMENTATION CODES:   Underweight  INTERVENTION:  Provide Nepro Shake po BID, each supplement provides 425 kcal and 19 grams protein.   Will update Rena-vite to be given QHS.   NUTRITION DIAGNOSIS:   Increased nutrient needs related to catabolic illness(ESRD, cirrhosis) as evidenced by estimated needs.  GOAL:   Patient will meet greater than or equal to 90% of their needs  MONITOR:   PO intake, Supplement acceptance, Labs, Weight trends, Skin, I & O's  REASON FOR ASSESSMENT:   Malnutrition Screening Tool    ASSESSMENT:   78 year old female with PMHx of GERD, DM, HTN, Takotsubo cardiomyopathy, liver cirrhosis, portal hypertension, esophageal varices, melena, hx RCC, ESRD training to do PD who is admitted with hyperkalemia, PD, wheeze and possible aspiration, also with small wound growing staph aureus.  Spoke with patient over the phone. She reports her appetite is good now and is also good at baseline. She ate well at dinner last night (75%). She reports at home she typically eats 3 meals per day.  For breakfast she has pancakes with bacon or sausage. For lunch and dinner she has [place], chicken, or another meat. She reports she is a "picky eater." She reports dialysis is new and she has only been receiving treatments for the past 3-4 weeks. Patient is amenable to trying Nepro.  Patient reports her UBW was 110 lbs and she has been losing weight over time. Per chart she was 50.1 kg on 08/31/2017. She is now 42.2 kg (93 lbs). She has lost 7.9 kg (15.8% body weight) over the past 6 months, which is significant for time frame.  Medications reviewed and include: azithromycin, cholecalciferol 2000 units daily, Novolog 0-9 units TID, Novolog 0-5 units QHS, lactulose 10 grams BID, magnesium oxide 400 mg BID, Rena-vite daily, Ocuvite daily, vitamin B12 500 micrograms daily, cefrriaxone.  Labs reviewed: CBG 123-282, Sodium 128, Chloride  92, CO2 20, BUN 94, Creatinine 12.01. Potassium 4.4 today. No phosphorus level available in chart.  RD suspects patient is malnourished but unable to confirm without completing NFPE.   NUTRITION - FOCUSED PHYSICAL EXAM:  Unable to complete at this time.  Diet Order:   Diet Order            Diet renal/carb modified with fluid restriction Diet-HS Snack? Nothing; Fluid restriction: 1200 mL Fluid; Room service appropriate? Yes; Fluid consistency: Thin  Diet effective now             EDUCATION NEEDS:   No education needs have been identified at this time  Skin:  Skin Assessment: Skin Integrity Issues:(MSAD to buttocks; per MD note there is a small wound growing staph aureus but unsure of location)  Last BM:  03/18/2019 - small type 6  Height:   Ht Readings from Last 1 Encounters:  03/17/19 5\' 2"  (1.575 m)   Weight:   Wt Readings from Last 1 Encounters:  03/17/19 42.2 kg   Ideal Body Weight:  50 kg  BMI:  Body mass index is 17.01 kg/m.  Estimated Nutritional Needs:   Kcal:  1500-1700  Protein:  70-80 grams  Fluid:  UOP + 1 L  Willey Blade, MS, RD, LDN Office: [phone] Pager: [phone] After Hours/Weekend Pager: [phone]

## 2019-03-18 NOTE — Progress Notes (Signed)
Spoke with daughter [name]. Per [name], a few days ago the patient had take twelve pills all at one time and became choked. [name] was concerned that the patient may had aspirated. Per the daughter the patient had complained that her throat hurt and felt like one of the pills had gotten stuck in her throat.  Fuller Mandril, RN

## 2019-03-19 ENCOUNTER — Inpatient Hospital Stay: Payer: Medicare HMO

## 2019-03-19 LAB — CBC
HCT: 25.8 % — ABNORMAL LOW (ref 36.0–46.0)
Hemoglobin: 8.5 g/dL — ABNORMAL LOW (ref 12.0–15.0)
MCH: 28.1 pg (ref 26.0–34.0)
MCHC: 32.9 g/dL (ref 30.0–36.0)
MCV: 85.1 fL (ref 80.0–100.0)
Platelets: 136 10*3/uL — ABNORMAL LOW (ref 150–400)
RBC: 3.03 MIL/uL — ABNORMAL LOW (ref 3.87–5.11)
RDW: 15.6 % — ABNORMAL HIGH (ref 11.5–15.5)
WBC: 7.3 10*3/uL (ref 4.0–10.5)
nRBC: 0 % (ref 0.0–0.2)

## 2019-03-19 LAB — BASIC METABOLIC PANEL
Anion gap: 13 (ref 5–15)
BUN: 27 mg/dL — ABNORMAL HIGH (ref 8–23)
CO2: 27 mmol/L (ref 22–32)
Calcium: 7.9 mg/dL — ABNORMAL LOW (ref 8.9–10.3)
Chloride: 99 mmol/L (ref 98–111)
Creatinine, Ser: 4.3 mg/dL — ABNORMAL HIGH (ref 0.44–1.00)
GFR calc Af Amer: 11 mL/min — ABNORMAL LOW (ref >60)
GFR calc non Af Amer: 9 mL/min — ABNORMAL LOW (ref >60)
Glucose, Bld: 109 mg/dL — ABNORMAL HIGH (ref 70–99)
Potassium: 2.8 mmol/L — ABNORMAL LOW (ref 3.5–5.1)
Sodium: 139 mmol/L (ref 135–145)

## 2019-03-19 LAB — GLUCOSE, CAPILLARY
Glucose-Capillary: 160 mg/dL — ABNORMAL HIGH (ref 70–99)
Glucose-Capillary: 102 mg/dL — ABNORMAL HIGH (ref 70–99)
Glucose-Capillary: 203 mg/dL — ABNORMAL HIGH (ref 70–99)
Glucose-Capillary: 171 mg/dL — ABNORMAL HIGH (ref 70–99)

## 2019-03-19 LAB — PATHOLOGIST SMEAR REVIEW

## 2019-03-19 NOTE — Evaluation (Signed)
Clinical/Bedside Swallow Evaluation Patient Details  Name: [name] MRN: [id number] Date of Birth: September 25, 1940  Today's Date: 03/19/2019 Time: SLP Start Time (ACUTE ONLY): 0840 SLP Stop Time (ACUTE ONLY): 0905 SLP Time Calculation (min) (ACUTE ONLY): 25 min  Past Medical History:  Past Medical History:  Diagnosis Date  . 10 year risk of MI or stroke 7.5% or greater   . Anemia   . Ascites   . Bursitis   . CKD (chronic kidney disease)   . Diabetes mellitus ([place])   . Esophageal varices ([place])   . ESRD (end stage renal disease) ([place]) 12/2018   to start dialysis  . FHx: migraine headaches   . Gastritis and duodenitis   . GERD (gastroesophageal reflux disease)   . Hiatal hernia   . Hypercholesterolemia   . Hypertension   . Kidney stone   . Liver cirrhosis ([place])   . Melena   . Portal hypertension ([place])   . Renal cell carcinoma ([place])   . Renal lithiasis   . Takotsubo cardiomyopathy 06/2013   Past Surgical History:  Past Surgical History:  Procedure Laterality Date  . ABDOMINAL HYSTERECTOMY    . BACK SURGERY    . CHOLECYSTECTOMY    . COLONOSCOPY  06/15/2005   RMR: Minimal internal hemorrhoids. Otherwise normal rectum/Normal colon  . COLONOSCOPY    12/14/2001   RMR: Internal hemorrhage, otherwise, normal rectum/ Polyps at 30 and 35 cm resected; the remainder of colonic mucosa appeared normal  . COLONOSCOPY  05/2009   RMR: minimal hemorrhoids, sigmoid hyperplastic polyp  . COLONOSCOPY N/A 10/24/2013   [id number] colonic polyps removed as described above/Colonic diverticulosis  . ESOPHAGEAL VARICE LIGATION  12/2018   [facility]  . ESOPHAGOGASTRODUODENOSCOPY  06/12/2007   RAQ:[id number] esophagus/patulous EG junction, moderate sized  hiatal hernia, otherwise normal stomach, D1, D2  . ESOPHAGOGASTRODUODENOSCOPY N/A 10/24/2013   RMR: Small hiatal hernia; otherwise, normal EGD  . HIP SURGERY     right  . LEFT HEART CATHETERIZATION WITH CORONARY ANGIOGRAM N/A 07/02/2013   Procedure: LEFT HEART CATHETERIZATION WITH CORONARY ANGIOGRAM;  Surgeon: Josue Hector, MD;  Location: [facility] CATH LAB;  Service: Cardiovascular;  Laterality: N/A;  . PARTIAL HYSTERECTOMY    . TENCKHOFF CATHETER INSERTION     HPI:      Assessment / Plan / Recommendation Clinical Impression  pt presents with a minimal risk for aspiration. Pt has hx of difficulty with pills x 5 days ago when she intook 11 pills at once. pt then felt as if one of the pills continued to be located in her pharyngeal space as reported by pt. CXR is clear for pna over weekend. pt is complaining of sore throat that extends to inner ear. SLP educated on physical structures for pain locations and pt options with meals. pt denied wanting to try puree diet as she will not eat the foods that looked this way. pt self reports that she is a picky eater and will not eat many foods. SLP and pt discussed diet options and pt determined a dysphagia 2 with thin diet would be best for softer foods and textures that would allow for healing of pharyngeal space. no overt ssx aspiration were noted with solids or liquids throughout evaluation. SLP will downgrade diet and follow up to wensure diet toleration and nutritional needs are met. SLP Visit Diagnosis: Dysphagia, pharyngeal phase (R13.13)    Aspiration Risk  Mild aspiration risk    Diet Recommendation Dysphagia 2 (Fine chop);Thin liquid   Liquid  Administration via: Straw Medication Administration: Crushed with puree Supervision: Patient able to self feed Compensations: Slow rate;Small sips/bites;Follow solids with liquid Postural Changes: Seated upright at 90 degrees    Other  Recommendations Oral Care Recommendations: Patient independent with oral care   Follow up Recommendations        Frequency and Duration min 3x week  2 weeks       Prognosis Prognosis for Safe Diet Advancement: Good      Swallow Study   General Date of Onset: 03/18/19 Type of Study: Bedside  Swallow Evaluation Diet Prior to this Study: Regular;Thin liquids Temperature Spikes Noted: No Respiratory Status: Room air History of Recent Intubation: No Behavior/Cognition: Alert;Cooperative;Pleasant mood Oral Cavity Assessment: Within Functional Limits Oral Care Completed by SLP: No Vision: Functional for self-feeding Self-Feeding Abilities: Able to feed self Patient Positioning: Upright in bed Baseline Vocal Quality: Normal Volitional Cough: Strong Volitional Swallow: Able to elicit    Oral/Motor/Sensory Function Overall Oral Motor/Sensory Function: Within functional limits   Ice Chips Ice chips: Within functional limits Presentation: Spoon   Thin Liquid Thin Liquid: Within functional limits Presentation: Straw;Cup    Nectar Thick Nectar Thick Liquid: Not tested   Honey Thick Honey Thick Liquid: Not tested   Puree Puree: Within functional limits Presentation: Spoon   Solid     Solid: Within functional limits Presentation: Hilario Quarry Sauber 03/19/2019,9:55 AM

## 2019-03-19 NOTE — Progress Notes (Signed)
PT Cancellation Note  Patient Details Name: [name] MRN: [id number] DOB: 1940/12/02   Cancelled Treatment:    Reason Eval/Treat Not Completed: Patient not medically ready.  Pt's potassium noted to be decreased to 2.8 this afternoon.  Per PT guidelines for low potassium, exertional activity contra-indicated.  Will hold PT at this time and re-attempt PT evaluation at a later date/time as medically appropriate.  Leitha Bleak, PT 03/19/19, 3:50 PM [phone]

## 2019-03-19 NOTE — Progress Notes (Signed)
Hemodialysis treatment completed at 1300. No fluid removed.  Blood rinsed back, CVC flushed with NS and locked with heparin. Red caps replaced. CVC dressing changed and insertion site remains without signs and symptoms of infection.  Post hemodialysis report given to T. Turner,RN.    03/19/19 1300  Hand-Off documentation  Report given to (Full Name) Lewie Chamber, RN  Vital Signs  Temp 97.9 F (36.6 C)  Temp Source Oral  Pulse Rate 85  Resp (!) 28  BP (!) 81/59  BP Location Right Arm  BP Method Automatic  Patient Position (if appropriate) Lying  Oxygen Therapy  SpO2 100 %  During Hemodialysis Assessment  Dialysis Fluid Bolus Normal Saline  Bolus Amount (mL) 250 mL (rinseback)  Intra-Hemodialysis Comments Tx completed  Post-Hemodialysis Assessment  Rinseback Volume (mL) 250 mL  Dialyzer Clearance Lightly streaked  Duration of HD Treatment -hour(s) 3 hour(s)  Hemodialysis Intake (mL) 500 mL  UF Total -Machine (mL) 500 mL  Net UF (mL) 0 mL  Tolerated HD Treatment Yes  Education / Care Plan  Dialysis Education Provided Yes  Documented Education in Care Plan Yes  Outpatient Plan of Care Reviewed and on Chart Yes  Hemodialysis Catheter Right Subclavian  No Placement Date or Time found.   Placed prior to admission: Yes  Orientation: Right  Access Location: Subclavian  Site Condition No complications  Blue Lumen Status Flushed;Heparin locked;Capped (Central line)  Red Lumen Status Heparin locked;Capped (Central line)  Purple Lumen Status Heparin locked;Capped (Central line)  Dressing Type Occlusive  Dressing Status Clean;Dry;Intact;Dressing changed

## 2019-03-19 NOTE — Progress Notes (Signed)
Pre hemodialysis report received from Lewie Chamber, RN. Received patient in acute room via bed. Awake, alert and verbally responsive. No acute distress noted. CVC without signs and symptoms of infection.  Dressing in place and uncompromised. Accessed CVC per policy and found patent on each side. Hemodialysis treatment initiated at 0957. Plan for 3 hours treatment without UF.    03/19/19 0945  Hand-Off documentation  Report given to (Full Name) Leeann Must, RN  Report received from (Full Name) Lewie Chamber, RN  Vital Signs  Temp 98 F (36.7 C)  Temp Source Oral  Pulse Rate 84  Pulse Rate Source Monitor  Resp 16  BP (!) 95/58  BP Location Right Arm  BP Method Automatic  Patient Position (if appropriate) Lying  Oxygen Therapy  SpO2 100 %  O2 Device Room Air  Pain Assessment  Pain Scale 0-10  Pain Score 0  Time-Out for Hemodialysis  What Procedure? HEMODIALYSIS  Pt Identifiers(min of two) First/Last Name;Pt's DOB(use if MRN/Acct# not available  Correct Site? Yes  Correct Side? Yes  Correct Procedure? Yes  Consents Verified? Yes  Rad Studies Available? Yes  Safety Precautions Reviewed? Yes  Engineer, civil (consulting) Number 5  Station Number 4  UF/Alarm Test Passed  Conductivity: Meter 13.8  Conductivity: Machine  14  pH 7  Reverse Osmosis MAIN  Normal Saline Lot Number [id number] 02/2020  Dialyzer Lot Number [id number]  Disposable Set Lot Number [id number]  Machine Temperature 98.6 F (37 C)  Musician and Audible Yes  Blood Lines Intact and Secured Yes  Pre Treatment Patient Checks  Vascular access used during treatment Catheter  HD catheter dressing before treatment WDL  Patient is receiving dialysis in a chair  (NO IN BED)  Hepatitis B Surface Antigen Results Negative  Date Hepatitis B Surface Antigen Drawn 03/06/19  Hepatitis B Surface Antibody  (SUSEPTIBLE <10)  Date Hepatitis B Surface Antibody Drawn 03/06/19  Hemodialysis Consent Verified  Yes  Hemodialysis Standing Orders Initiated Yes  ECG (Telemetry) Monitor On Yes  Prime Ordered Normal Saline  Length of  DialysisTreatment -hour(s) 3 Hour(s)  Dialysis Treatment Comments PT TO HD VIA BED BY STAFF, NO C/O VOICED, WILL CON'T TO MONITOR  Dialyzer Elisio 17H NR  Dialysate 2K;2.5 Ca  Dialysis Anticoagulant None  Dialysate Flow Ordered 600  Blood Flow Rate Ordered 400 mL/min  Ultrafiltration Goal 0 Liters  During Hemodialysis Assessment  Blood Flow Rate (mL/min) 400 mL/min  Arterial Pressure (mmHg) -200 mmHg  Venous Pressure (mmHg) 140 mmHg  Transmembrane Pressure (mmHg) 50 mmHg  Ultrafiltration Rate (mL/min) 170 mL/min  Dialysate Flow Rate (mL/min) 600 ml/min  Conductivity: Machine  14  HD Safety Checks Performed Yes  Dialysis Fluid Bolus Normal Saline  Bolus Amount (mL) 200 mL (NS PRIME AT HD TX START)  Intra-Hemodialysis Comments Tx initiated

## 2019-03-19 NOTE — Progress Notes (Signed)
PT Cancellation Note  Patient Details Name: [name] MRN: [id number] DOB: 1940/11/12   Cancelled Treatment:    Reason Eval/Treat Not Completed: Patient at procedure or test/unavailable.  PT consult received.  Chart reviewed.  Pt currently off unit at dialysis and also pending updated Hgb (last value noted to be 6.4 yesterday and s/p blood transfusion).  Will re-attempt PT evaluation at a later date/time as medically appropriate.  Leitha Bleak, PT 03/19/19, 11:41 AM [phone]

## 2019-03-19 NOTE — Progress Notes (Signed)
[place], [place] 03/19/19  Subjective:  Patient seen and evaluated during hemodialysis. States that she is not feeling very well today. Otherwise tolerating dialysis well.   Objective:  Vital signs in last 24 hours:  Temp:  [97.5 F (36.4 C)-98.9 F (37.2 C)] 98 F (36.7 C) (07/20 0945) Pulse Rate:  [80-90] 83 (07/20 1100) Resp:  [15-23] 23 (07/20 1100) BP: (86-111)/(51-62) 107/62 (07/20 1100) SpO2:  [97 %-100 %] 100 % (07/20 1100)  Weight change:  Filed Weights   03/17/19 1056  Weight: 42.2 kg    Intake/Output:    Intake/Output Summary (Last 24 hours) at 03/19/2019 1115 Last data filed at 03/19/2019 0900 Gross per 24 hour  Intake 1186 ml  Output -  Net 1186 ml     Physical Exam: General:  Thin, cachectic, frail, laying in bed  HEENT  moist oral mucous membranes  Neck  supple  Pulm/lungs  bilateral rhonchi, O2 by [place]  CVS/Heart  irregular rhythm  Abdomen:   Soft, distended, BS present  Extremities:  No edema  Neurologic:  Alert, able to answer all questions appropriately  Skin:  No acute rashes  Access:  Right IJ PermCath.  PD catheter       Basic Metabolic Panel:  Recent Labs  Lab 03/17/19 1130 03/18/19 0451  NA 128* 128*  K 5.7* 4.4  CL 90* 92*  CO2 20* 20*  GLUCOSE 203* 156*  BUN 88* 94*  CREATININE 11.56* 12.01*  CALCIUM 7.0* 6.8*     CBC: Recent Labs  Lab 03/17/19 1130 03/18/19 0451  WBC 13.8* 8.8  HGB 8.0* 6.4*  HCT 24.9* 19.5*  MCV 85.6 84.8  PLT 185 140*     No results found for: HEPBSAG, HEPBSAB, HEPBIGM    Microbiology:  Recent Results (from the past 240 hour(s))  SARS Coronavirus 2 (CEPHEID- Performed in [place] hospital lab), Hosp Order     Status: None   Collection Time: 03/17/19  2:54 PM   Specimen: Nasopharyngeal Swab  Result Value Ref Range Status   SARS Coronavirus 2 NEGATIVE NEGATIVE Final    Comment: (NOTE) If result is NEGATIVE SARS-CoV-2 target nucleic acids are NOT  DETECTED. The SARS-CoV-2 RNA is generally detectable in upper and lower  respiratory specimens during the acute phase of infection. The lowest  concentration of SARS-CoV-2 viral copies this assay can detect is 250  copies / mL. A negative result does not preclude SARS-CoV-2 infection  and should not be used as the sole basis for treatment or other  patient management decisions.  A negative result may occur with  improper specimen collection / handling, submission of specimen other  than nasopharyngeal swab, presence of viral mutation(s) within the  areas targeted by this assay, and inadequate number of viral copies  (<250 copies / mL). A negative result must be combined with clinical  observations, patient history, and epidemiological information. If result is POSITIVE SARS-CoV-2 target nucleic acids are DETECTED. The SARS-CoV-2 RNA is generally detectable in upper and lower  respiratory specimens dur ing the acute phase of infection.  Positive  results are indicative of active infection with SARS-CoV-2.  Clinical  correlation with patient history and other diagnostic information is  necessary to determine patient infection status.  Positive results do  not rule out bacterial infection or co-infection with other viruses. If result is PRESUMPTIVE POSTIVE SARS-CoV-2 nucleic acids MAY BE PRESENT.   A presumptive positive result was obtained on the submitted specimen  and confirmed  on repeat testing.  While 2019 novel coronavirus  (SARS-CoV-2) nucleic acids may be present in the submitted sample  additional confirmatory testing may be necessary for epidemiological  and / or clinical management purposes  to differentiate between  SARS-CoV-2 and other Sarbecovirus currently known to infect humans.  If clinically indicated additional testing with an alternate test  methodology [phone]) is advised. The SARS-CoV-2 RNA is generally  detectable in upper and lower respiratory sp ecimens during  the acute  phase of infection. The expected result is Negative. Fact Sheet for Patients:  StrictlyIdeas.no Fact Sheet for Healthcare Providers: BankingDealers.co.za This test is not yet approved or cleared by the [place] FDA and has been authorized for detection and/or diagnosis of SARS-CoV-2 by FDA under an Emergency Use Authorization (EUA).  This EUA will remain in effect (meaning this test can be used) for the duration of the COVID-19 declaration under Section 564(b)(1) of the Act, 21 U.S.C. section 360bbb-3(b)(1), unless the authorization is terminated or revoked sooner. Performed at [facility], [address] [place], [place] [address]   Culture, blood (single) w Reflex to ID Panel     Status: None (Preliminary result)   Collection Time: 03/18/19  9:11 AM   Specimen: BLOOD  Result Value Ref Range Status   Specimen Description BLOOD RIGHT ANTECUBITAL  Final   Special Requests   Final    BOTTLES DRAWN AEROBIC AND ANAEROBIC Blood Culture adequate volume   Culture   Final    NO GROWTH < 24 HOURS Performed at [facility], [address] [place], [place] [address]    Report Status PENDING  Incomplete    Coagulation Studies: No results for input(s): LABPROT, INR in the last 72 hours.  Urinalysis: No results for input(s): COLORURINE, LABSPEC, PHURINE, GLUCOSEU, HGBUR, BILIRUBINUR, KETONESUR, PROTEINUR, UROBILINOGEN, NITRITE, LEUKOCYTESUR in the last 72 hours.  Invalid input(s): APPERANCEUR    Imaging: Dg Chest 2 View  Result Date: 03/18/2019 CLINICAL DATA:  Cough. EXAM: CHEST - 2 VIEW COMPARISON:  Radiograph of March 17, 2019. FINDINGS: The heart size and mediastinal contours are within normal limits. Both lungs are clear. Right internal jugular catheter is unchanged in position. Atherosclerosis of thoracic aorta is noted. No pneumothorax or pleural effusion is noted. The visualized skeletal structures  are unremarkable. IMPRESSION: No active cardiopulmonary disease. Aortic Atherosclerosis (ICD10-I70.0). Electronically Signed   By: Marijo Conception M.D.   On: 03/18/2019 08:23   Dg Chest Port 1 View  Result Date: 03/17/2019 CLINICAL DATA:  Shortness of breath, cough. EXAM: PORTABLE CHEST 1 VIEW COMPARISON:  Radiograph of Jan 10, 2019. FINDINGS: The heart size and mediastinal contours are within normal limits. Both lungs are clear. No pneumothorax or pleural effusion is noted. Right internal jugular dialysis catheter is noted with distal tip in expected position of right atrium. Atherosclerosis of abdominal aorta is noted. The visualized skeletal structures are unremarkable. IMPRESSION: No active disease. Aortic Atherosclerosis (ICD10-I70.0). Electronically Signed   By: Marijo Conception M.D.   On: 03/17/2019 14:44     Medications:    . sodium chloride   Intravenous Once  . buPROPion  100 mg Oral Daily  . Chlorhexidine Gluconate Cloth  6 each Topical Q0600  . cholecalciferol  2,000 Units Oral Daily  . doxycycline  100 mg Oral BID  . feeding supplement (NEPRO CARB STEADY)  237 mL Oral BID BM  . FLUoxetine  40 mg Oral Daily  . insulin aspart  0-5 Units Subcutaneous QHS  .  insulin aspart  0-9 Units Subcutaneous TID WC  . ipratropium-albuterol  3 mL Nebulization BID  . lactulose  10 g Oral BID  . magnesium oxide  400 mg Oral BID  . midodrine  10 mg Oral TID PC  . multivitamin  1 tablet Oral QHS  . multivitamin-lutein  1 capsule Oral Daily  . pneumococcal 23 valent vaccine  0.5 mL Intramuscular Tomorrow-1000  . traZODone  100 mg Oral QHS  . vitamin B-12  500 mcg Oral Daily   acetaminophen **OR** acetaminophen, guaiFENesin-dextromethorphan, menthol-cetylpyridinium, ondansetron **OR** ondansetron (ZOFRAN) IV, phenol  Assessment/ Plan:  78 y.o. Caucasian female with end-stage renal disease, cirrhosis with ascites, portal hypertension, history of renal cell cancer, cardiomyopathy, esophageal  varices, diabetes presents for generalized weakness, cough, hemoptysis  [facility]/PD  1.  End-stage renal disease, on hemodialysis and Patient is undergoing PD training as outpatient She is now admitted for cough, possible aspiration pneumonia Patient has chylous ascites.  PD fluid cell count shows total WBC count of 207 with 59% lymphocytes and only 22% neutrophils.  This is consistent with chronic inflammation not Acute peritonitis.  Body fluid culture still pending. -Patient seen and evaluated during hemodialysis.  Tolerating well.  Reevaluate for dialysis on Wednesday.  2.  Skin infection Wound culture as outpatient was growing MRSA from July 13 She was being treated with intraperitoneal vancomycin starting July 13 Wound is healing well.  Now on oral doxycycline.  3.  Cough, hemoptysis, generalized weakness Possible underlying pneumonia Monitor closely.   4.  Anemia of CKD. Patient underwent blood transfusion.  Continue to monitor hemoglobin closely.  5.  Hyperkalemia Corrected with Lokelma and shifting measures Expected to stay corrected with dialysis today     LOS: 2 Mileena Rothenberger 7/20/202011:15 AM  [place], [place]  Note: This note was prepared with Dragon dictation. Any transcription errors are unintentional

## 2019-03-19 NOTE — Progress Notes (Signed)
Called Angela regarding patient's blood pressure of 82/51. Patient given midodrine. Will continue to monitor per Angela'a request. Christene Slates 03/19/2019  11:32 PM

## 2019-03-19 NOTE — Progress Notes (Signed)
[place] at [place] NAME: [name]    MR#:  [id number]  DATE OF BIRTH:  11/07/1940  SUBJECTIVE:  CHIEF COMPLAINT:   Chief Complaint  Patient presents with  . Cough  . Weakness   Came with weakness, Hyperkalemia, chocking on one of her pills.  She have complaint of hemoptysis today.  REVIEW OF SYSTEMS:   CONSTITUTIONAL: No fever,have fatigue or weakness.  EYES: No blurred or double vision.  EARS, NOSE, AND THROAT: No tinnitus or ear pain.  RESPIRATORY: No cough, shortness of breath, wheezing , have hemoptysis.  CARDIOVASCULAR: No chest pain, orthopnea, edema.  GASTROINTESTINAL: No nausea, vomiting, diarrhea or abdominal pain.  GENITOURINARY: No dysuria, hematuria.  ENDOCRINE: No polyuria, nocturia,  HEMATOLOGY: No anemia, easy bruising or bleeding SKIN: No rash or lesion. MUSCULOSKELETAL: No joint pain or arthritis.   NEUROLOGIC: No tingling, numbness, weakness.  PSYCHIATRY: No anxiety or depression.   ROS  DRUG ALLERGIES:   Allergies  Allergen Reactions  . Codeine Other (See Comments)    Reaction unknown   . Milk-Related Compounds Nausea And Vomiting    VITALS:  Blood pressure (!) 95/52, pulse 89, temperature 98.1 F (36.7 C), temperature source Oral, resp. rate 16, height 5\' 2"  (1.575 m), weight 42.2 kg, SpO2 100 %.  PHYSICAL EXAMINATION:  GENERAL:  78 y.o.-year-old patient lying in the bed with no acute distress.  EYES: Pupils equal, round, reactive to light and accommodation. No scleral icterus. Extraocular muscles intact. Conjunctiva pale. HEENT: Head atraumatic, normocephalic. Oropharynx and nasopharynx clear.  NECK:  Supple, no jugular venous distention. No thyroid enlargement, no tenderness.  LUNGS: Normal breath sounds bilaterally, no wheezing, rales,rhonchi or crepitation. No use of accessory muscles of respiration.  CARDIOVASCULAR: S1, S2 normal. No murmurs, rubs, or gallops.  ABDOMEN: Soft, nontender,  nondistended. Bowel sounds present. No organomegaly or mass.  EXTREMITIES: No pedal edema, cyanosis, or clubbing.  NEUROLOGIC: Cranial nerves II through XII are intact. Muscle strength 4/5 in all extremities. Sensation intact. Gait not checked.  PSYCHIATRIC: The patient is alert and oriented x 3.  SKIN: No obvious rash, lesion, or ulcer.   Physical Exam LABORATORY PANEL:   CBC Recent Labs  Lab 03/18/19 0451  WBC 8.8  HGB 6.4*  HCT 19.5*  PLT 140*   ------------------------------------------------------------------------------------------------------------------  Chemistries  Recent Labs  Lab 03/18/19 0451  NA 128*  K 4.4  CL 92*  CO2 20*  GLUCOSE 156*  BUN 94*  CREATININE 12.01*  CALCIUM 6.8*   ------------------------------------------------------------------------------------------------------------------  Cardiac Enzymes No results for input(s): TROPONINI in the last 168 hours. ------------------------------------------------------------------------------------------------------------------  RADIOLOGY:  Dg Chest 2 View  Result Date: 03/18/2019 CLINICAL DATA:  Cough. EXAM: CHEST - 2 VIEW COMPARISON:  Radiograph of March 17, 2019. FINDINGS: The heart size and mediastinal contours are within normal limits. Both lungs are clear. Right internal jugular catheter is unchanged in position. Atherosclerosis of thoracic aorta is noted. No pneumothorax or pleural effusion is noted. The visualized skeletal structures are unremarkable. IMPRESSION: No active cardiopulmonary disease. Aortic Atherosclerosis (ICD10-I70.0). Electronically Signed   By: Marijo Conception M.D.   On: 03/18/2019 08:23    ASSESSMENT AND PLAN:   Active Problems:   Hyperkalemia  1.  Hyperkalemia.  Treat with Lokelma, IV calcium, IV bicarb, IV insulin and D50.  Case discussed with nephrology. Improved.  2.  Abdominal pain. SBP ruled out.  With new peritoneal dialysis catheter.  We will send off culture and  sensitivity.  give Rocephin.  General fluid have 22 leukocytes so this seems to be chronic fluid. 3.  Small wound growing staph aureus as per nephrology -was given intraperitoneal vancomycin and oral Bactrim now we can give oral meds, will change to doxycycline. 4.  Wheeze and possible aspiration.  Repeat chest x-ray does not show any atelectasis.  I will stop ceftriaxone and azithromycin.    Pt have hemoptysis now- so will do CT chest. 5.  End-stage renal disease.  Patient training to do peritoneal dialysis.  Still has hemodialysis catheter in her right chest.  With the patient having cirrhosis she may not be the best candidate for peritoneal dialysis. HD today. 6.  Liver cirrhosis on lactulose 7.  Relative hypotension.  Hold Coreg. 8.  History of congestive heart failure and Takotsubo cardiomyopathy.  No signs of congestive heart failure at this time. 9.  Type 2 diabetes mellitus put on sliding scale. 10.  Anemia of chronic disease with history of esophageal varices.  Continue to follow hemoglobin.  Guaiac stools.    As hemoglobin is less than 7 given 1 unit of transfusion.    All the records are reviewed and case discussed with Care Management/Social Workerr. Management plans discussed with the patient, family and they are in agreement.  CODE STATUS: DNR  TOTAL TIME TAKING CARE OF THIS PATIENT: 35 minutes.     POSSIBLE D/C IN 1-2 DAYS, DEPENDING ON CLINICAL CONDITION.   Vaughan Basta M.D on 03/19/2019   Between 7am to 6pm - Pager - [phone]  After 6pm go to [email or web address] - password [email or web address] [facility]  Office  [phone]  CC: Primary care physician; Caryl Bis, MD  Note: This dictation was prepared with Dragon dictation along with smaller phrase technology. Any transcriptional errors that result from this process are unintentional.

## 2019-03-19 NOTE — Progress Notes (Signed)
Established peritoneal dialysis patient known at [facility]. Patient is transported to clinic by daughter, per clinic. Please contact me directly with any dialysis placement concerns.  Elvera Bicker Dialysis Coordinator [phone]

## 2019-03-20 DIAGNOSIS — D631 Anemia in chronic kidney disease: Secondary | ICD-10-CM

## 2019-03-20 DIAGNOSIS — I8500 Esophageal varices without bleeding: Secondary | ICD-10-CM

## 2019-03-20 DIAGNOSIS — Z992 Dependence on renal dialysis: Secondary | ICD-10-CM

## 2019-03-20 DIAGNOSIS — N186 End stage renal disease: Secondary | ICD-10-CM

## 2019-03-20 LAB — OCCULT BLOOD X 1 CARD TO LAB, STOOL: Fecal Occult Bld: POSITIVE — AB

## 2019-03-20 LAB — BASIC METABOLIC PANEL
Anion gap: 13 (ref 5–15)
BUN: 43 mg/dL — ABNORMAL HIGH (ref 8–23)
CO2: 28 mmol/L (ref 22–32)
Calcium: 7.8 mg/dL — ABNORMAL LOW (ref 8.9–10.3)
Chloride: 97 mmol/L — ABNORMAL LOW (ref 98–111)
Creatinine, Ser: 5.91 mg/dL — ABNORMAL HIGH (ref 0.44–1.00)
GFR calc Af Amer: 7 mL/min — ABNORMAL LOW (ref >60)
GFR calc non Af Amer: 6 mL/min — ABNORMAL LOW (ref >60)
Glucose, Bld: 183 mg/dL — ABNORMAL HIGH (ref 70–99)
Potassium: 3.8 mmol/L (ref 3.5–5.1)
Sodium: 138 mmol/L (ref 135–145)

## 2019-03-20 LAB — GLUCOSE, CAPILLARY
Glucose-Capillary: 154 mg/dL — ABNORMAL HIGH (ref 70–99)
Glucose-Capillary: 181 mg/dL — ABNORMAL HIGH (ref 70–99)
Glucose-Capillary: 140 mg/dL — ABNORMAL HIGH (ref 70–99)
Glucose-Capillary: 172 mg/dL — ABNORMAL HIGH (ref 70–99)

## 2019-03-20 LAB — PREPARE RBC (CROSSMATCH)

## 2019-03-20 LAB — CBC
HCT: 24.1 % — ABNORMAL LOW (ref 36.0–46.0)
Hemoglobin: 7.7 g/dL — ABNORMAL LOW (ref 12.0–15.0)
MCH: 28.3 pg (ref 26.0–34.0)
MCHC: 32 g/dL (ref 30.0–36.0)
MCV: 88.6 fL (ref 80.0–100.0)
Platelets: 130 10*3/uL — ABNORMAL LOW (ref 150–400)
RBC: 2.72 MIL/uL — ABNORMAL LOW (ref 3.87–5.11)
RDW: 15.6 % — ABNORMAL HIGH (ref 11.5–15.5)
WBC: 8.5 10*3/uL (ref 4.0–10.5)
nRBC: 0 % (ref 0.0–0.2)

## 2019-03-20 MED ORDER — VITAMIN C 500 MG PO TABS
500.0000 mg | ORAL_TABLET | Freq: Two times a day (BID) | ORAL | Status: DC
  Administered 2019-03-20 – 2019-03-22 (×4): 500 mg via ORAL
  Filled 2019-03-20 (×4): qty 1

## 2019-03-20 MED ORDER — SODIUM CHLORIDE 0.9% IV SOLUTION
Freq: Once | INTRAVENOUS | Status: AC
  Administered 2019-03-20: 19:00:00 via INTRAVENOUS

## 2019-03-20 NOTE — Consult Note (Signed)
Pulmonary Medicine          Date: 03/20/2019,   MRN# [id number] [name] 1940-09-20     AdmissionWeight: 42.2 kg                 CurrentWeight: 42.2 kg      CHIEF COMPLAINT:   Non massive hemoptysis and aspiration on pills.    HISTORY OF PRESENT ILLNESS   This is a very pleasant 78 year old female with a history of CKD with recent initiation of HD, [name] with portal hypertensive disease and esophageal varices status post admission to [facility] last month with severe upper GI bleed requiring 4 units PRBC transfusion, background history of essential hypertension, dyslipidemia, GERD, gastritis, migraine headaches, diabetes, renal cell carcinoma although patient denies this is accurate.  She was noted to have right upper lobe 1.8 cm spiculated nodule with endobronchial invasion and blood-streaked expectorate on admission although denies having this at home.  Discussing with patient the possible implications including malignancy, she wanted me to discuss with daughter Blanch Media and we had group phone call together.  Patient is currently fully independent taking care of all ADLs at home although she does have daughter at home to help as needed.  After discussing the possibilities including malignancy and less likely infection versus scar tissue patient wants to pursue further diagnosis with biopsy as well as discuss options for treatment.  I discussed that due to her ESRD/HD as well as portal hypertensive disease with bleeding varices she does need to be medically optimized prior to having anesthesia and procedure.  Plan is to evaluate on outpatient basis in pulmonary clinic with full PFTs, EKG, and get clearance from GI and renal prior to procedure.   PAST MEDICAL HISTORY   Past Medical History:  Diagnosis Date  . 10 year risk of MI or stroke 7.5% or greater   . Anemia   . Ascites   . Bursitis   . CKD (chronic kidney disease)   . Diabetes mellitus ([place])   . Esophageal  varices ([place])   . ESRD (end stage renal disease) ([place]) 12/2018   to start dialysis  . FHx: migraine headaches   . Gastritis and duodenitis   . GERD (gastroesophageal reflux disease)   . Hiatal hernia   . Hypercholesterolemia   . Hypertension   . Kidney stone   . Liver cirrhosis ([place])   . Melena   . Portal hypertension ([place])   . Renal cell carcinoma ([place])   . Renal lithiasis   . Takotsubo cardiomyopathy 06/2013     SURGICAL HISTORY   Past Surgical History:  Procedure Laterality Date  . ABDOMINAL HYSTERECTOMY    . BACK SURGERY    . CHOLECYSTECTOMY    . COLONOSCOPY  06/15/2005   RMR: Minimal internal hemorrhoids. Otherwise normal rectum/Normal colon  . COLONOSCOPY    12/14/2001   RMR: Internal hemorrhage, otherwise, normal rectum/ Polyps at 30 and 35 cm resected; the remainder of colonic mucosa appeared normal  . COLONOSCOPY  05/2009   RMR: minimal hemorrhoids, sigmoid hyperplastic polyp  . COLONOSCOPY N/A 10/24/2013   EUM:PNTIRWER colonic polyps removed as described above/Colonic diverticulosis  . ESOPHAGEAL VARICE LIGATION  12/2018   [facility]  . ESOPHAGOGASTRODUODENOSCOPY  06/12/2007   XVQ:MGQQPY esophagus/patulous EG junction, moderate sized  hiatal hernia, otherwise normal stomach, D1, D2  . ESOPHAGOGASTRODUODENOSCOPY N/A 10/24/2013   RMR: Small hiatal hernia; otherwise, normal EGD  . HIP SURGERY     right  . LEFT HEART  CATHETERIZATION WITH CORONARY ANGIOGRAM N/A 07/02/2013   Procedure: LEFT HEART CATHETERIZATION WITH CORONARY ANGIOGRAM;  Surgeon: Josue Hector, MD;  Location: [facility] CATH LAB;  Service: Cardiovascular;  Laterality: N/A;  . PARTIAL HYSTERECTOMY    . TENCKHOFF CATHETER INSERTION       FAMILY HISTORY   Family History  Problem Relation Age of Onset  . Pancreatic cancer Father 33  . Colon cancer Mother        late 109s     SOCIAL HISTORY   Social History   Tobacco Use  . Smoking status: Former Smoker    Packs/day: 1.00    Years: 43.00    Pack  years: 43.00    Types: Cigarettes    Start date: 08/31/1959    Quit date: 08/30/2002    Years since quitting: 16.5  . Smokeless tobacco: Never Used  . Tobacco comment: Quit x 10 years  Substance Use Topics  . Alcohol use: No  . Drug use: No     MEDICATIONS    Home Medication:    Current Medication:  Current Facility-Administered Medications:  .  0.9 %  sodium chloride infusion (Manually program via Guardrails IV Fluids), , Intravenous, Once, Vaughan Basta, MD .  acetaminophen (TYLENOL) tablet 650 mg, 650 mg, Oral, Q6H PRN, 650 mg at 03/19/19 1358 **OR** acetaminophen (TYLENOL) suppository 650 mg, 650 mg, Rectal, Q6H PRN, Leslye Peer, Richard, MD .  buPROPion [facility] SR) 12 hr tablet 100 mg, 100 mg, Oral, Daily, Leslye Peer, Richard, MD, 100 mg at 03/20/19 0908 .  Chlorhexidine Gluconate Cloth 2 % PADS 6 each, 6 each, Topical, Q0600, Murlean Iba, MD .  cholecalciferol (VITAMIN D3) tablet 2,000 Units, 2,000 Units, Oral, Daily, Loletha Grayer, MD, 2,000 Units at 03/20/19 [phone] .  doxycycline (VIBRA-TABS) tablet 100 mg, 100 mg, Oral, BID, Vaughan Basta, MD, 100 mg at 03/20/19 0801 .  feeding supplement (NEPRO CARB STEADY) liquid 237 mL, 237 mL, Oral, BID BM, Vaughan Basta, MD .  FLUoxetine (PROZAC) capsule 40 mg, 40 mg, Oral, Daily, Leslye Peer, Richard, MD, 40 mg at 03/20/19 0908 .  guaiFENesin-dextromethorphan (ROBITUSSIN DM) 100-10 MG/5ML syrup 15 mL, 15 mL, Oral, Q4H PRN, Loletha Grayer, MD, 15 mL at 03/18/19 2209 .  insulin aspart (novoLOG) injection 0-5 Units, 0-5 Units, Subcutaneous, QHS, Loletha Grayer, MD, 2 Units at 03/17/19 2212 .  insulin aspart (novoLOG) injection 0-9 Units, 0-9 Units, Subcutaneous, TID WC, Loletha Grayer, MD, 1 Units at 03/20/19 1710 .  ipratropium-albuterol (DUONEB) 0.5-2.5 (3) MG/3ML nebulizer solution 3 mL, 3 mL, Nebulization, BID, Vaughan Basta, MD, 3 mL at 03/20/19 0711 .  lactulose (CHRONULAC) 10 GM/15ML solution 10  g, 10 g, Oral, BID, Leslye Peer, Richard, MD, 10 g at 03/20/19 1206 .  magnesium oxide (MAG-OX) tablet 400 mg, 400 mg, Oral, BID, Leslye Peer, Richard, MD, 400 mg at 03/20/19 0908 .  menthol-cetylpyridinium (CEPACOL) lozenge 3 mg, 1 lozenge, Oral, PRN, Candiss Norse, [place], MD .  midodrine (PROAMATINE) tablet 10 mg, 10 mg, Oral, TID PC, [place], Richard, MD, 10 mg at 03/20/19 1710 .  multivitamin (RENA-VIT) tablet 1 tablet, 1 tablet, Oral, QHS, Vaughan Basta, MD, 1 tablet at 03/19/19 2154 .  multivitamin-lutein (OCUVITE-LUTEIN) capsule 1 capsule, 1 capsule, Oral, Daily, Leslye Peer, Richard, MD, 1 capsule at 03/20/19 0908 .  ondansetron (ZOFRAN) tablet 4 mg, 4 mg, Oral, Q6H PRN **OR** ondansetron (ZOFRAN) injection 4 mg, 4 mg, Intravenous, Q6H PRN, Loletha Grayer, MD, 4 mg at 03/17/19 2018 .  phenol (CHLORASEPTIC) mouth spray 2 spray, 2 spray, Mouth/Throat, PRN,  Loletha Grayer, MD, 2 spray at 03/19/19 0915 .  pneumococcal 23 valent vaccine (PNU-IMMUNE) injection 0.5 mL, 0.5 mL, Intramuscular, Tomorrow-1000, Wieting, Richard, MD .  traZODone (DESYREL) tablet 100 mg, 100 mg, Oral, QHS, Wieting, Richard, MD, 100 mg at 03/19/19 2154 .  vitamin B-12 (CYANOCOBALAMIN) tablet 500 mcg, 500 mcg, Oral, Daily, Leslye Peer, Richard, MD, 500 mcg at 03/20/19 0908 .  vitamin C (ASCORBIC ACID) tablet 500 mg, 500 mg, Oral, BID, Vaughan Basta, MD    ALLERGIES   Codeine and Milk-related compounds     REVIEW OF SYSTEMS    Review of Systems:  Gen:  Denies  fever, sweats, chills weigh loss  HEENT: Denies blurred vision, double vision, ear pain, eye pain, hearing loss, nose bleeds, sore throat Cardiac:  No dizziness, chest pain or heaviness, chest tightness,edema Resp:   Denies cough or sputum porduction, shortness of breath,wheezing, hemoptysis,  Gi: Denies swallowing difficulty, stomach pain, nausea or vomiting, diarrhea, constipation, bowel incontinence Gu:  Denies bladder incontinence, burning  urine Ext:   Denies Joint pain, stiffness or swelling Skin: Denies  skin rash, easy bruising or bleeding or hives Endoc:  Denies polyuria, polydipsia , polyphagia or weight change Psych:   Denies depression, insomnia or hallucinations   Other:  All other systems negative   VS: BP (!) 89/47 (BP Location: Right Arm)   Pulse 80   Temp 98.1 F (36.7 C) (Oral)   Resp 17   Ht 5\' 2"  (1.575 m)   Wt 42.2 kg   SpO2 100%   BMI 17.01 kg/m      PHYSICAL EXAM    GENERAL:NAD, no fevers, chills, no weakness no fatigue HEAD: Normocephalic, atraumatic.  EYES: Pupils equal, round, reactive to light. Extraocular muscles intact. No scleral icterus.  MOUTH: Moist mucosal membrane. Dentition intact. No abscess noted.  EAR, NOSE, THROAT: Clear without exudates. No external lesions.  NECK: Supple. No thyromegaly. No nodules. No JVD.  PULMONARY: Mild rhonchi bilaterally worse at right upper lung zone.  CARDIOVASCULAR: S1 and S2. Regular rate and rhythm. No murmurs, rubs, or gallops. No edema. Pedal pulses 2+ bilaterally.  GASTROINTESTINAL: Soft, nontender, nondistended. No masses. Positive bowel sounds. No hepatosplenomegaly.  MUSCULOSKELETAL: No swelling, clubbing, or edema. Range of motion full in all extremities.  NEUROLOGIC: Cranial nerves II through XII are intact. No gross focal neurological deficits. Sensation intact. Reflexes intact.  SKIN: No ulceration, lesions, rashes, or cyanosis. Skin warm and dry. Turgor intact.  PSYCHIATRIC: Mood, affect within normal limits. The patient is awake, alert and oriented x 3. Insight, judgment intact.       IMAGING      Dg Chest 2 View  Result Date: 03/18/2019 CLINICAL DATA:  Cough. EXAM: CHEST - 2 VIEW COMPARISON:  Radiograph of March 17, 2019. FINDINGS: The heart size and mediastinal contours are within normal limits. Both lungs are clear. Right internal jugular catheter is unchanged in position. Atherosclerosis of thoracic aorta is noted. No  pneumothorax or pleural effusion is noted. The visualized skeletal structures are unremarkable. IMPRESSION: No active cardiopulmonary disease. Aortic Atherosclerosis (ICD10-I70.0). Electronically Signed   By: Marijo Conception M.D.   On: 03/18/2019 08:23   Ct Chest Wo Contrast  Result Date: 03/19/2019 CLINICAL DATA:  Hemoptysis, ESRD EXAM: CT CHEST WITHOUT CONTRAST TECHNIQUE: Multidetector CT imaging of the chest was performed following the standard protocol without IV contrast. COMPARISON:  None. FINDINGS: Cardiovascular: Aortic atherosclerosis. Normal heart size. Left coronary artery calcifications. No pericardial effusion. Mediastinum/Nodes: No enlarged mediastinal, hilar, or  axillary lymph nodes. Thyroid gland, trachea, and esophagus demonstrate no significant findings. Lungs/Pleura: There is a spiculated pulmonary nodule of the suprahilar right upper lobe measuring 1.8 cm, which abuts and appears to invade an apical segment right upper lobe bronchus (series 4, image 53). No pleural effusion or pneumothorax. Upper Abdomen: Cirrhotic morphology of the liver, splenomegaly, and ascites. Musculoskeletal: No chest wall mass or suspicious bone lesions identified. IMPRESSION: 1. There is a spiculated pulmonary nodule of the suprahilar right upper lobe measuring 1.8 cm, which abuts and appears to invade an apical segment right upper lobe bronchus (series 4, image 53). Findings are concerning for primary lung malignancy. 2. Cirrhotic morphology of the liver, splenomegaly, and ascites in the included upper abdomen. 3.  Coronary artery disease and aortic atherosclerosis. Electronically Signed   By: Eddie Candle M.D.   On: 03/19/2019 15:42   Dg Chest Port 1 View  Result Date: 03/17/2019 CLINICAL DATA:  Shortness of breath, cough. EXAM: PORTABLE CHEST 1 VIEW COMPARISON:  Radiograph of Jan 10, 2019. FINDINGS: The heart size and mediastinal contours are within normal limits. Both lungs are clear. No pneumothorax or  pleural effusion is noted. Right internal jugular dialysis catheter is noted with distal tip in expected position of right atrium. Atherosclerosis of abdominal aorta is noted. The visualized skeletal structures are unremarkable. IMPRESSION: No active disease. Aortic Atherosclerosis (ICD10-I70.0). Electronically Signed   By: Marijo Conception M.D.   On: 03/17/2019 14:44       ASSESSMENT/PLAN   Right upper lung 1.8cm spiculated nodule with non-massive hemoptysis  -Discussed with patient and daughter Blanch Media today.  - if hx of RCC is accurate patient is at higher chance of malignancy  - hemoptysis has resolved at this time - due to significant comorbid conditions including active severe anemia w/ GI bleeding patient will need GI clearance.   - will need to evaluate in pulmonary clinic with PFT and pulm-preoperative evaluation to assure safety with anesthesia  -patient has unintentional weight loss but unsure how much.  - patient wants to proceed with biopsy and is agreeable to outpatient workup      Acute blood loss anemia  - due to upper GI bleed with esophageal varices   - GI on case   - s/p PRBC transfusion   - close monitoring and if becomes unstable would recommend upgrade to MICU   Thank you for allowing me to participate in the care of this patient.   Patient/Family are satisfied with care plan and all questions have been answered.  This document was prepared using Dragon voice recognition software and may include unintentional dictation errors.     Ottie Glazier, M.D.  Division of [place]

## 2019-03-20 NOTE — Care Management Important Message (Signed)
Important Message  Patient Details  Name: [name] MRN: [id number] Date of Birth: 05/28/1941   Medicare Important Message Given:  Yes     Dannette Barbara 03/20/2019, 11:16 AM

## 2019-03-20 NOTE — Progress Notes (Signed)
[place] [facility] [place] NAME: [name]    MR#:  [id number]  DATE OF BIRTH:  [birth year]  SUBJECTIVE:  CHIEF COMPLAINT:   Chief Complaint  Patient presents with  . Cough  . Weakness   Came with weakness, Hyperkalemia, chocking on one of her pills.  She have complaint of hemoptysis - noted to have nodule on CT chest.  REVIEW OF SYSTEMS:   CONSTITUTIONAL: No fever,have fatigue or weakness.  EYES: No blurred or double vision.  EARS, NOSE, AND THROAT: No tinnitus or ear pain.  RESPIRATORY: No cough, shortness of breath, wheezing , have hemoptysis.  CARDIOVASCULAR: No chest pain, orthopnea, edema.  GASTROINTESTINAL: No nausea, vomiting, diarrhea or abdominal pain.  GENITOURINARY: No dysuria, hematuria.  ENDOCRINE: No polyuria, nocturia,  HEMATOLOGY: No anemia, easy bruising or bleeding SKIN: No rash or lesion. MUSCULOSKELETAL: No joint pain or arthritis.   NEUROLOGIC: No tingling, numbness, weakness.  PSYCHIATRY: No anxiety or depression.   ROS  DRUG ALLERGIES:   Allergies  Allergen Reactions  . Codeine Other (See Comments)    Reaction unknown   . Milk-Related Compounds Nausea And Vomiting and Other (See Comments)    Pt. does not like it. Not allergic to it.    VITALS:  Blood pressure (!) 89/47, pulse 80, temperature 98.1 F (36.7 C), temperature source Oral, resp. rate 17, height 5\' 2"  (1.575 m), weight 42.2 kg, SpO2 100 %.  PHYSICAL EXAMINATION:  GENERAL:  78 y.o.-year-old patient lying in the bed with no acute distress.  EYES: Pupils equal, round, reactive to light and accommodation. No scleral icterus. Extraocular muscles intact. Conjunctiva pale. HEENT: Head atraumatic, normocephalic. Oropharynx and nasopharynx clear.  NECK:  Supple, no jugular venous distention. No thyroid enlargement, no tenderness.  LUNGS: Normal breath sounds bilaterally, no wheezing, rales,rhonchi or crepitation. No use of accessory muscles of  respiration.  CARDIOVASCULAR: S1, S2 normal. No murmurs, rubs, or gallops.  ABDOMEN: Soft, nontender, nondistended. Bowel sounds present. No organomegaly or mass.  EXTREMITIES: No pedal edema, cyanosis, or clubbing.  NEUROLOGIC: Cranial nerves II through XII are intact. Muscle strength 4/5 in all extremities. Sensation intact. Gait not checked.  PSYCHIATRIC: The patient is alert and oriented x 3.  SKIN: No obvious rash, lesion, or ulcer.   Physical Exam LABORATORY PANEL:   CBC Recent Labs  Lab 03/20/19 0305  WBC 8.5  HGB 7.7*  HCT 24.1*  PLT 130*   ------------------------------------------------------------------------------------------------------------------  Chemistries  Recent Labs  Lab 03/20/19 0305  NA 138  K 3.8  CL 97*  CO2 28  GLUCOSE 183*  BUN 43*  CREATININE 5.91*  CALCIUM 7.8*   ------------------------------------------------------------------------------------------------------------------  Cardiac Enzymes No results for input(s): TROPONINI in the last 168 hours. ------------------------------------------------------------------------------------------------------------------  RADIOLOGY:  Ct Chest Wo Contrast  Result Date: 03/19/2019 CLINICAL DATA:  Hemoptysis, ESRD EXAM: CT CHEST WITHOUT CONTRAST TECHNIQUE: Multidetector CT imaging of the chest was performed following the standard protocol without IV contrast. COMPARISON:  None. FINDINGS: Cardiovascular: Aortic atherosclerosis. Normal heart size. Left coronary artery calcifications. No pericardial effusion. Mediastinum/Nodes: No enlarged mediastinal, hilar, or axillary lymph nodes. Thyroid gland, trachea, and esophagus demonstrate no significant findings. Lungs/Pleura: There is a spiculated pulmonary nodule of the suprahilar right upper lobe measuring 1.8 cm, which abuts and appears to invade an apical segment right upper lobe bronchus (series 4, image 53). No pleural effusion or pneumothorax. Upper  Abdomen: Cirrhotic morphology of the liver, splenomegaly, and ascites. Musculoskeletal: No chest wall mass or suspicious  bone lesions identified. IMPRESSION: 1. There is a spiculated pulmonary nodule of the suprahilar right upper lobe measuring 1.8 cm, which abuts and appears to invade an apical segment right upper lobe bronchus (series 4, image 53). Findings are concerning for primary lung malignancy. 2. Cirrhotic morphology of the liver, splenomegaly, and ascites in the included upper abdomen. 3.  Coronary artery disease and aortic atherosclerosis. Electronically Signed   By: Eddie Candle M.D.   On: 03/19/2019 15:42    ASSESSMENT AND PLAN:   Active Problems:   Hyperkalemia  1.  Hyperkalemia.  Treat with Lokelma, IV calcium, IV bicarb, IV insulin and D50. Received HD. Improved.  2.  Abdominal pain. SBP ruled out.  With new peritoneal dialysis catheter.   send off culture and sensitivity.   give Rocephin.  General fluid have 22 leukocytes so this seems to be chronic fluid. 3.  Small wound growing staph aureus as per nephrology -was given intraperitoneal vancomycin and oral Bactrim now we can give oral meds, will change to doxycycline. 4.  Lung nodule/ Mass suspected pneumonia on admission- ruled out.   Repeat chest x-ray does not show any atelectasis.  stop ceftriaxone and azithromycin.    Pt have hemoptysis now- CT chest shows 1.8 cm Nodule in right upper lobe.  Pulm consult. 5.  End-stage renal disease.  Patient training to do peritoneal dialysis.  Still has hemodialysis catheter in her right chest. HD today. 6.  Liver cirrhosis on lactulose  She had vericeal bleed in recent past, also had anemia this time, transfused- called GI consult. 7.  Relative hypotension.  Hold Coreg. 8.  History of congestive heart failure and Takotsubo cardiomyopathy.  No signs of congestive heart failure at this time. 9.  Type 2 diabetes mellitus put on sliding scale. 10.  Anemia of chronic disease with history of  esophageal varices.  Continue to follow hemoglobin.  Guaiac stools positive.    As hemoglobin is less than 7 given 1 unit of transfusion 03/18/19.    All the records are reviewed and case discussed with Care Management/Social Workerr. Management plans discussed with the patient, family and they are in agreement.  CODE STATUS: DNR  TOTAL TIME TAKING CARE OF THIS PATIENT: 35 minutes.     POSSIBLE D/C IN 1-2 DAYS, DEPENDING ON CLINICAL CONDITION.   Vaughan Basta M.D on 03/20/2019   Between 7am to 6pm - Pager - [phone]  After 6pm go to [email or web address] - password [email or web address] [facility]  Office  [phone]  CC: Primary care physician; Caryl Bis, MD  Note: This dictation was prepared with Dragon dictation along with smaller phrase technology. Any transcriptional errors that result from this process are unintentional.

## 2019-03-20 NOTE — Progress Notes (Signed)
[place], [place] 03/20/19  Subjective:  Patient seen at bedside. Tolerate dialysis well yesterday. New lung mass found on CT chest.   Objective:  Vital signs in last 24 hours:  Temp:  [97.9 F (36.6 C)-98.7 F (37.1 C)] 97.9 F (36.6 C) (07/21 0405) Pulse Rate:  [82-102] 82 (07/21 0405) Resp:  [16-28] 18 (07/21 0405) BP: (81-110)/(51-69) 85/58 (07/21 0405) SpO2:  [96 %-100 %] 96 % (07/21 0405)  Weight change:  Filed Weights   03/17/19 1056  Weight: 42.2 kg    Intake/Output:    Intake/Output Summary (Last 24 hours) at 03/20/2019 1154 Last data filed at 03/20/2019 1013 Gross per 24 hour  Intake 480 ml  Output 0 ml  Net 480 ml     Physical Exam: General:  Thin, cachectic, frail, laying in bed  HEENT  moist oral mucous membranes  Neck  supple  Pulm/lungs  bilateral rhonchi, O2 by [place]  CVS/Heart  irregular rhythm  Abdomen:   Soft, distended, BS present  Extremities:  No edema  Neurologic:  Alert, able to answer all questions appropriately  Skin:  No acute rashes  Access:  Right IJ PermCath.  PD catheter       Basic Metabolic Panel:  Recent Labs  Lab 03/17/19 1130 03/18/19 0451 03/19/19 1429 03/20/19 0305  NA 128* 128* 139 138  K 5.7* 4.4 2.8* 3.8  CL 90* 92* 99 97*  CO2 20* 20* 27 28  GLUCOSE 203* 156* 109* 183*  BUN 88* 94* 27* 43*  CREATININE 11.56* 12.01* 4.30* 5.91*  CALCIUM 7.0* 6.8* 7.9* 7.8*     CBC: Recent Labs  Lab 03/17/19 1130 03/18/19 0451 03/19/19 1429 03/20/19 0305  WBC 13.8* 8.8 7.3 8.5  HGB 8.0* 6.4* 8.5* 7.7*  HCT 24.9* 19.5* 25.8* 24.1*  MCV 85.6 84.8 85.1 88.6  PLT 185 140* 136* 130*     No results found for: HEPBSAG, HEPBSAB, HEPBIGM    Microbiology:  Recent Results (from the past 240 hour(s))  SARS Coronavirus 2 (CEPHEID- Performed in [facility] hospital lab), Hosp Order     Status: None   Collection Time: 03/17/19  2:54 PM   Specimen: Nasopharyngeal Swab  Result Value Ref  Range Status   SARS Coronavirus 2 NEGATIVE NEGATIVE Final    Comment: (NOTE) If result is NEGATIVE SARS-CoV-2 target nucleic acids are NOT DETECTED. The SARS-CoV-2 RNA is generally detectable in upper and lower  respiratory specimens during the acute phase of infection. The lowest  concentration of SARS-CoV-2 viral copies this assay can detect is 250  copies / mL. A negative result does not preclude SARS-CoV-2 infection  and should not be used as the sole basis for treatment or other  patient management decisions.  A negative result may occur with  improper specimen collection / handling, submission of specimen other  than nasopharyngeal swab, presence of viral mutation(s) within the  areas targeted by this assay, and inadequate number of viral copies  (<250 copies / mL). A negative result must be combined with clinical  observations, patient history, and epidemiological information. If result is POSITIVE SARS-CoV-2 target nucleic acids are DETECTED. The SARS-CoV-2 RNA is generally detectable in upper and lower  respiratory specimens dur ing the acute phase of infection.  Positive  results are indicative of active infection with SARS-CoV-2.  Clinical  correlation with patient history and other diagnostic information is  necessary to determine patient infection status.  Positive results do  not rule out bacterial infection  or co-infection with other viruses. If result is PRESUMPTIVE POSTIVE SARS-CoV-2 nucleic acids MAY BE PRESENT.   A presumptive positive result was obtained on the submitted specimen  and confirmed on repeat testing.  While 2019 novel coronavirus  (SARS-CoV-2) nucleic acids may be present in the submitted sample  additional confirmatory testing may be necessary for epidemiological  and / or clinical management purposes  to differentiate between  SARS-CoV-2 and other Sarbecovirus currently known to infect humans.  If clinically indicated additional testing with an  alternate test  methodology [phone]) is advised. The SARS-CoV-2 RNA is generally  detectable in upper and lower respiratory sp ecimens during the acute  phase of infection. The expected result is Negative. Fact Sheet for Patients:  StrictlyIdeas.no Fact Sheet for Healthcare Providers: BankingDealers.co.za This test is not yet approved or cleared by the [place] FDA and has been authorized for detection and/or diagnosis of SARS-CoV-2 by FDA under an Emergency Use Authorization (EUA).  This EUA will remain in effect (meaning this test can be used) for the duration of the COVID-19 declaration under Section 564(b)(1) of the Act, 21 U.S.C. section 360bbb-3(b)(1), unless the authorization is terminated or revoked sooner. Performed at [facility], [address] [place], [place] [address]   Culture, blood (single) w Reflex to ID Panel     Status: None (Preliminary result)   Collection Time: 03/18/19  9:11 AM   Specimen: BLOOD  Result Value Ref Range Status   Specimen Description BLOOD RIGHT ANTECUBITAL  Final   Special Requests   Final    BOTTLES DRAWN AEROBIC AND ANAEROBIC Blood Culture adequate volume   Culture   Final    NO GROWTH 2 DAYS Performed at [facility], [address] [place], [place] [address]    Report Status PENDING  Incomplete  Body fluid culture     Status: None (Preliminary result)   Collection Time: 03/18/19  9:57 AM   Specimen: Peritoneal Dialysate; Body Fluid  Result Value Ref Range Status   Specimen Description   Final    PERITONEAL DIALYSATE Performed at [facility], [address] [place], [place] [address]    Special Requests   Final    Normal Performed at [facility], [address] [place], [place] [address]    Gram Stain   Final    FEW WBC PRESENT, PREDOMINANTLY PMN NO ORGANISMS SEEN    Culture   Final    NO GROWTH 2 DAYS Performed at [facility] [address] [place], [place] [address]    Report Status PENDING  Incomplete    Coagulation Studies: No results for input(s): LABPROT, INR in the last 72 hours.  Urinalysis: No results for input(s): COLORURINE, LABSPEC, PHURINE, GLUCOSEU, HGBUR, BILIRUBINUR, KETONESUR, PROTEINUR, UROBILINOGEN, NITRITE, LEUKOCYTESUR in the last 72 hours.  Invalid input(s): APPERANCEUR    Imaging: Ct Chest Wo Contrast  Result Date: 03/19/2019 CLINICAL DATA:  Hemoptysis, ESRD EXAM: CT CHEST WITHOUT CONTRAST TECHNIQUE: Multidetector CT imaging of the chest was performed following the standard protocol without IV contrast. COMPARISON:  None. FINDINGS: Cardiovascular: Aortic atherosclerosis. Normal heart size. Left coronary artery calcifications. No pericardial effusion. Mediastinum/Nodes: No enlarged mediastinal, hilar, or axillary lymph nodes. Thyroid gland, trachea, and esophagus demonstrate no significant findings. Lungs/Pleura: There is a spiculated pulmonary nodule of the suprahilar right upper lobe measuring 1.8 cm, which abuts and appears to invade an apical segment right upper lobe bronchus (series 4, image 53). No pleural effusion or  pneumothorax. Upper Abdomen: Cirrhotic morphology of the liver, splenomegaly, and ascites. Musculoskeletal: No chest wall mass or suspicious bone lesions identified. IMPRESSION: 1. There is a spiculated pulmonary nodule of the suprahilar right upper lobe measuring 1.8 cm, which abuts and appears to invade an apical segment right upper lobe bronchus (series 4, image 53). Findings are concerning for primary lung malignancy. 2. Cirrhotic morphology of the liver, splenomegaly, and ascites in the included upper abdomen. 3.  Coronary artery disease and aortic atherosclerosis. Electronically Signed   By: Eddie Candle M.D.   On: 03/19/2019 15:42     Medications:    . sodium chloride   Intravenous Once  . buPROPion  100 mg Oral Daily  . Chlorhexidine Gluconate Cloth  6  each Topical Q0600  . cholecalciferol  2,000 Units Oral Daily  . doxycycline  100 mg Oral BID  . feeding supplement (NEPRO CARB STEADY)  237 mL Oral BID BM  . FLUoxetine  40 mg Oral Daily  . insulin aspart  0-5 Units Subcutaneous QHS  . insulin aspart  0-9 Units Subcutaneous TID WC  . ipratropium-albuterol  3 mL Nebulization BID  . lactulose  10 g Oral BID  . magnesium oxide  400 mg Oral BID  . midodrine  10 mg Oral TID PC  . multivitamin  1 tablet Oral QHS  . multivitamin-lutein  1 capsule Oral Daily  . pneumococcal 23 valent vaccine  0.5 mL Intramuscular Tomorrow-1000  . traZODone  100 mg Oral QHS  . vitamin B-12  500 mcg Oral Daily   acetaminophen **OR** acetaminophen, guaiFENesin-dextromethorphan, menthol-cetylpyridinium, ondansetron **OR** ondansetron (ZOFRAN) IV, phenol  Assessment/ Plan:  78 y.o. Caucasian female with end-stage renal disease, cirrhosis with ascites, portal hypertension, history of renal cell cancer, cardiomyopathy, esophageal varices, diabetes presents for generalized weakness, cough, hemoptysis  [facility]/PD  1.  End-stage renal disease, on hemodialysis and Patient is undergoing PD training as outpatient She is now admitted for cough, possible aspiration pneumonia Patient has chylous ascites.  PD fluid cell count shows total WBC count of 207 with 59% lymphocytes and only 22% neutrophils.  This is consistent with chronic inflammation not Acute peritonitis.  Body fluid culture still pending. -Patient underwent dialysis yesterday.  We will plan for hemodialysis again tomorrow.  Resume peritoneal dialysis as an outpatient..  2.  Skin infection Wound culture as outpatient was growing MRSA from July 13 She was being treated with intraperitoneal vancomycin starting July 13 Wound is healing well.  Now on oral doxycycline.  3.  Cough, hemoptysis, generalized weakness Chest CT reveals lung mass.  Consider oncology referral but defer to  hospitalist.  4.  Anemia of CKD. Hold off on Epogen given underlying lung mass..  5.  Hyperkalemia Continue to monitor serum potassium.     LOS: 3 Mikail Goostree 7/21/202011:54 AM  [place], [place]  Note: This note was prepared with Dragon dictation. Any transcription errors are unintentional

## 2019-03-20 NOTE — Evaluation (Signed)
Physical Therapy Evaluation Patient Details Name: [name] MRN: [id number] DOB: 08-Oct-1940 Today's Date: 03/20/2019   History of Present Illness  78 y.o. female who presents the emergency department complaining of malaise, chills, weakness, sore throat, cough, shortness of breath. NO:[id number], abdominal pain, small wound growing staph aureus as per nephrology, wheeze and possible aspiration . Admitted: 03/17/2019 PMH: Diabetes mellitus, Hypertension, end-stage renal disease, pneumonia, R hip surgery, L heart catheterization with coronary, partial hysterectomy, colonoscopy, R HD, and peritoneal dialysis.  Clinical Impression  Prior to hospital admission, pt was living independently.  Pt lives in an level entry apartment alone.  Currently pt is modified independently with bed mobility; +1 CGA with ambulation 40 feet (20 feet with RW; 20 feet without RW with holding on furniture while walking inside of the room. Hand held offered to keep balance). Pt reports baseline walking with cane prior to hospitalization. Pt would benefit from skilled PT to address noted impairments and functional limitations (see below for any additional details).  Upon hospital discharge, recommend pt discharge to home with HHPT to improve gait, balance, and functional mobility.    Follow Up Recommendations Home health PT    Equipment Recommendations  Rolling walker with 5" wheels(pt. states has RW at home from back surgery 9 years ago)    Recommendations for Other Services       Precautions / Restrictions Precautions Precautions: None Restrictions Weight Bearing Restrictions: No      Mobility  Bed Mobility Overal bed mobility: Modified Independent             General bed mobility comments: Increased time and effort to transfer from supine to sitting to EOB  Transfers Overall transfer level: Modified independent Equipment used: None             General transfer comment: Able to stand up  with BUE suport pushing from bed and sitting down to chair with UE support from arm rest  Ambulation/Gait Ambulation/Gait assistance: Min guard Gait Distance (Feet): 40 Feet Assistive device: Rolling walker (2 wheeled);None Gait Pattern/deviations: Step-through pattern Gait velocity: decrease   General Gait Details: +1 CGA with ambulation 40 feet inside of the room. 20 feet used RW and 20 feet wihout RW and with hand held support. Increased postural sway noted when walking without RW, hand held offered.  Stairs            Wheelchair Mobility    Modified Rankin (Stroke Patients Only)       Balance Overall balance assessment: Needs assistance Sitting-balance support: Feet supported;No upper extremity supported Sitting balance-Leahy Scale: Good Sitting balance - Comments: Steady sitting with reaching within BOS   Standing balance support: No upper extremity supported Standing balance-Leahy Scale: Good Standing balance comment: Steady standing with reaching within BOS                             Pertinent Vitals/Pain Pain Assessment: No/denies pain    Home Living Family/patient expects to be discharged to:: Private residence Living Arrangements: Alone Available Help at Discharge: Family;Available PRN/intermittently;Available 24 hours/day Type of Home: Apartment Home Access: Level entry     Home Layout: One level Home Equipment: Walker - 2 wheels;Shower seat - built in;Grab bars - tub/shower Additional Comments: pt has equipment from previous back surgeries, but currently only use cane for ambulation for baseline    Prior Function Level of Independence: Independent with assistive device(s)  Hand Dominance   Dominant Hand: Right    Extremity/Trunk Assessment   Upper Extremity Assessment Upper Extremity Assessment: Overall WFL for tasks assessed(at least 3/5 with B elbow felxion, extension, and shoulder flexion.)    Lower  Extremity Assessment Lower Extremity Assessment: Overall WFL for tasks assessed(At least 3/5 B hip flexion, knee flexion, knee extension, and ankle dorsiflexion and plantar flexion.)       Communication   Communication: No difficulties  Cognition Arousal/Alertness: Awake/alert Behavior During Therapy: WFL for tasks assessed/performed Overall Cognitive Status: Within Functional Limits for tasks assessed                                        General Comments General comments (skin integrity, edema, etc.): Dialysis port are intact at the end of the session.    Exercises     Assessment/Plan    PT Assessment Patient needs continued PT services  PT Problem List Decreased range of motion;Decreased strength;Decreased balance;Decreased coordination;Decreased mobility       PT Treatment Interventions Gait training;DME instruction;Functional mobility training;Therapeutic activities;Therapeutic exercise;Balance training    PT Goals (Current goals can be found in the Care Plan section)  Acute Rehab PT Goals Patient Stated Goal: to go home PT Goal Formulation: With patient Time For Goal Achievement: 04/04/19 Potential to Achieve Goals: Good    Frequency Min 2X/week   Barriers to discharge        Co-evaluation               AM-PAC PT "6 Clicks" Mobility  Outcome Measure Help needed turning from your back to your side while in a flat bed without using bedrails?: None Help needed moving from lying on your back to sitting on the side of a flat bed without using bedrails?: A Little Help needed moving to and from a bed to a chair (including a wheelchair)?: A Little Help needed standing up from a chair using your arms (e.g., wheelchair or bedside chair)?: A Little Help needed to walk in hospital room?: A Little Help needed climbing 3-5 steps with a railing? : A Little 6 Click Score: 19    End of Session Equipment Utilized During Treatment: Gait belt Activity  Tolerance: Patient tolerated treatment well Patient left: in chair;with call bell/phone within reach;with chair alarm set;with SCD's reapplied Nurse Communication: Mobility status;Weight bearing status PT Visit Diagnosis: Unsteadiness on feet (R26.81);Other abnormalities of gait and mobility (R26.89);Muscle weakness (generalized) (M62.81);Difficulty in walking, not elsewhere classified (R26.2)    Time: 3810-1751 PT Time Calculation (min) (ACUTE ONLY): 33 min   Charges:                Sherrilyn Rist, SPT 03/20/2019, 1:42 PM

## 2019-03-20 NOTE — H&P (Signed)
[name] , MD [address] [place], [place], [place], [address] [address], [place], [place], [place], [address] Phone: [phone]  Fax: [phone]  Consultation  Referring Provider:    Dr Marthann Schiller Primary Care Physician:  Caryl Bis, MD Primary Gastroenterologist:  Dr. Windle Guard          Reason for Consultation:     Anemia   Date of Admission:  03/17/2019 Date of Consultation:  03/20/2019         HPI:   [name] is a 78 y.o. female who carries a diagnosis of decompensated liver cirrhosis with ascites and history of variceal bleed in February 2020.  She also suffers from end-stage renal disease on hemodialysis.,  CHF, MI, hypertension, RCC and cirrhosis felt secondary to [place].  Last seen by her gastroenterologist on 03/09/2019.  Recent admission in May 2020 for a variceal bleed and underwent band ligation.  Subsequently had a repeat EGD on 02/22/2019 and was found to have a small esophageal varix it was too small to bland, hiatal hernia noted.  Commenced on beta-blocker but was subsequently discontinued due to hypotension.  On lactulose, having loose stools.  She was admitted on 03/17/2019 with cough and weakness.  She was coughing and wheezing and brought up some clear phlegm.  Empirically treated for SBP with Rocephin.  During the process of admission she has been having some hemoptysis. Underwent a CT scan of that this admission and was found to have a spiculated pulmonary nodule in the suprahilar right upper lobe measuring 1.8 cm.  Appears to invade and apical segment of the right upper lobe bronchus.  Concerning for primary lung malignancy.  Cirrhosis, splenomegaly and ascites in the abdomen.  Labs 03/17/2019 hemoglobin of 8 g.  Hemoglobin on 02/26/2019 was 7.6 g, 02/24/2019 was 8.1 g.  I have been consulted for drop in hemoglobin.  On admission her hemoglobin was 8.  0 g and today to 7.7 g.  It is very similar to what she was on 02/26/2019 and 02/24/2019.  There has  been change in her creatinine from 12 on admission to 5.91.  She clearly states that she has not had any hematemesis.  She has had some hemoptysis.  She has not noted the color of her stool recently.  When I went into her room she was midway through her lunch.  She does not have any problems with her swallowing.  No other complaints at this point of time.  Past Medical History:  Diagnosis Date  . 10 year risk of MI or stroke 7.5% or greater   . Anemia   . Ascites   . Bursitis   . CKD (chronic kidney disease)   . Diabetes mellitus ([place])   . Esophageal varices ([place])   . ESRD (end stage renal disease) ([place]) 12/2018   to start dialysis  . FHx: migraine headaches   . Gastritis and duodenitis   . GERD (gastroesophageal reflux disease)   . Hiatal hernia   . Hypercholesterolemia   . Hypertension   . Kidney stone   . Liver cirrhosis ([place])   . Melena   . Portal hypertension ([place])   . Renal cell carcinoma ([place])   . Renal lithiasis   . Takotsubo cardiomyopathy 06/2013    Past Surgical History:  Procedure Laterality Date  . ABDOMINAL HYSTERECTOMY    . BACK SURGERY    . CHOLECYSTECTOMY    . COLONOSCOPY  06/15/2005   RMR: Minimal internal hemorrhoids. Otherwise  normal rectum/Normal colon  . COLONOSCOPY    12/14/2001   RMR: Internal hemorrhage, otherwise, normal rectum/ Polyps at 30 and 35 cm resected; the remainder of colonic mucosa appeared normal  . COLONOSCOPY  05/2009   RMR: minimal hemorrhoids, sigmoid hyperplastic polyp  . COLONOSCOPY N/A 10/24/2013   KGY:JEHUDJSH colonic polyps removed as described above/Colonic diverticulosis  . ESOPHAGEAL VARICE LIGATION  12/2018   [facility]  . ESOPHAGOGASTRODUODENOSCOPY  06/12/2007   FWY:OVZCHY esophagus/patulous EG junction, moderate sized  hiatal hernia, otherwise normal stomach, D1, D2  . ESOPHAGOGASTRODUODENOSCOPY N/A 10/24/2013   RMR: Small hiatal hernia; otherwise, normal EGD  . HIP SURGERY     right  . LEFT HEART CATHETERIZATION WITH  CORONARY ANGIOGRAM N/A 07/02/2013   Procedure: LEFT HEART CATHETERIZATION WITH CORONARY ANGIOGRAM;  Surgeon: Josue Hector, MD;  Location: [facility] CATH LAB;  Service: Cardiovascular;  Laterality: N/A;  . PARTIAL HYSTERECTOMY    . TENCKHOFF CATHETER INSERTION      Prior to Admission medications   Medication Sig Start Date End Date Taking? Authorizing Provider  acetaminophen (TYLENOL) 500 MG tablet Take 500 mg by mouth every 6 (six) hours as needed for mild pain or fever.   Yes [provider]  B Complex-C-Folic Acid (NEPHRO VITAMINS) 0.8 MG TABS Take 1 tablet by mouth daily. 01/19/19  Yes [provider]  buPROPion (WELLBUTRIN SR) 100 MG 12 hr tablet Take 100 mg by mouth daily.    Yes [provider]  cholecalciferol (VITAMIN D) 1000 UNITS tablet Take 2,000 Units by mouth daily.   Yes [provider]  ciprofloxacin (CIPRO) 250 MG tablet Take 250 mg by mouth daily. 02/08/19  Yes [provider]  FLUoxetine (PROZAC) 40 MG capsule Take 40 mg by mouth daily.   Yes [provider]  lactulose (CHRONULAC) 10 GM/15ML solution Take 10 g by mouth 2 (two) times daily.   Yes [provider]  magnesium oxide (MAG-OX) 400 MG tablet Take 400 mg by mouth 2 (two) times daily.   Yes [provider]  midodrine (PROAMATINE) 10 MG tablet Take 10 mg by mouth 3 (three) times daily after meals. 02/26/19  Yes [provider]  Multiple Vitamins-Minerals (PRESERVISION AREDS) TABS Take 1 tablet by mouth daily.    Yes [provider]  nystatin (MYCOSTATIN) 100000 UNIT/ML suspension Take 5 mLs by mouth 3 (three) times daily. 02/26/19  Yes [provider]  pantoprazole (PROTONIX) 40 MG tablet Take 40 mg by mouth 2 (two) times daily. 02/26/19  Yes [provider]  sulfamethoxazole-trimethoprim (BACTRIM DS) 800-160 MG tablet Take 1 tablet by mouth every other day. 03/15/19  Yes [provider]  torsemide (DEMADEX) 20 MG  tablet Take 40 mg by mouth daily.  02/28/19  Yes [provider]  traZODone (DESYREL) 50 MG tablet Take 100 mg by mouth at bedtime.    Yes [provider]  vitamin B-12 (CYANOCOBALAMIN) 500 MCG tablet Take 500 mcg by mouth daily.   Yes [provider]    Family History  Problem Relation Age of Onset  . Pancreatic cancer Father 5  . Colon cancer Mother        late 61s     Social History   Tobacco Use  . Smoking status: Former Smoker    Packs/day: 1.00    Years: 43.00    Pack years: 43.00    Types: Cigarettes    Start date: 08/31/1959    Quit date: 08/30/2002    Years since  quitting: 16.5  . Smokeless tobacco: Never Used  . Tobacco comment: Quit x 10 years  Substance Use Topics  . Alcohol use: No  . Drug use: No    Allergies as of 03/17/2019 - Review Complete 03/17/2019  Allergen Reaction Noted  . Codeine Other (See Comments) 07/02/2013    Review of Systems:    All systems reviewed and negative except where noted in HPI.   Physical Exam:  Vital signs in last 24 hours: Temp:  [97.9 F (36.6 C)-98.7 F (37.1 C)] 97.9 F (36.6 C) (07/21 0405) Pulse Rate:  [82-102] 82 (07/21 0405) Resp:  [16-28] 18 (07/21 0405) BP: (81-102)/(51-62) 85/58 (07/21 0405) SpO2:  [96 %-100 %] 96 % (07/21 0405) Last BM Date: 03/19/19 General:   Appears very thin and cachectic. Head:  Normocephalic and atraumatic. Eyes:   No icterus.   Conjunctiva pink. PERRLA. Ears:  Normal auditory acuity. Neck:  Supple; no masses or thyroidomegaly Lungs: Respirations even and unlabored. Lungs clear to auscultation bilaterally.   No wheezes, crackles, or rhonchi.  Heart:  Regular rate and rhythm;  Without murmur, clicks, rubs or gallops Abdomen:  Soft, nontender. Normal bowel sounds. No appreciable masses or hepatomegaly.  No rebound or guarding.  Neurologic:  Alert and oriented x3;  grossly normal neurologically. Skin: Bruise skin over arms and chest Cervical Nodes:  No  significant cervical adenopathy. Psych:  Alert and cooperative. Normal affect.  LAB RESULTS: Recent Labs    03/18/19 0451 03/19/19 1429 03/20/19 0305  WBC 8.8 7.3 8.5  HGB 6.4* 8.5* 7.7*  HCT 19.5* 25.8* 24.1*  PLT 140* 136* 130*   BMET Recent Labs    03/18/19 0451 03/19/19 1429 03/20/19 0305  NA 128* 139 138  K 4.4 2.8* 3.8  CL 92* 99 97*  CO2 20* 27 28  GLUCOSE 156* 109* 183*  BUN 94* 27* 43*  CREATININE 12.01* 4.30* 5.91*  CALCIUM 6.8* 7.9* 7.8*   LFT No results for input(s): PROT, ALBUMIN, AST, ALT, ALKPHOS, BILITOT, BILIDIR, IBILI in the last 72 hours. PT/INR No results for input(s): LABPROT, INR in the last 72 hours.  STUDIES: Ct Chest Wo Contrast  Result Date: 03/19/2019 CLINICAL DATA:  Hemoptysis, ESRD EXAM: CT CHEST WITHOUT CONTRAST TECHNIQUE: Multidetector CT imaging of the chest was performed following the standard protocol without IV contrast. COMPARISON:  None. FINDINGS: Cardiovascular: Aortic atherosclerosis. Normal heart size. Left coronary artery calcifications. No pericardial effusion. Mediastinum/Nodes: No enlarged mediastinal, hilar, or axillary lymph nodes. Thyroid gland, trachea, and esophagus demonstrate no significant findings. Lungs/Pleura: There is a spiculated pulmonary nodule of the suprahilar right upper lobe measuring 1.8 cm, which abuts and appears to invade an apical segment right upper lobe bronchus (series 4, image 53). No pleural effusion or pneumothorax. Upper Abdomen: Cirrhotic morphology of the liver, splenomegaly, and ascites. Musculoskeletal: No chest wall mass or suspicious bone lesions identified. IMPRESSION: 1. There is a spiculated pulmonary nodule of the suprahilar right upper lobe measuring 1.8 cm, which abuts and appears to invade an apical segment right upper lobe bronchus (series 4, image 53). Findings are concerning for primary lung malignancy. 2. Cirrhotic morphology of the liver, splenomegaly, and ascites in the included upper  abdomen. 3.  Coronary artery disease and aortic atherosclerosis. Electronically Signed   By: Eddie Candle M.D.   On: 03/19/2019 15:42      Impression / Plan:   YAELIS SCHARFENBERG is a 78 y.o. y/o female with a history of decompensated cirrhosis secondary to [place] with  ascites and history of variceal bleed in May 2020 s/p banding of the esophagus and a recent upper endoscopy in 02/22/2019 showing no large esophageal varices that require further banding.  Admitted with cough abdominal pain shortness of breath phlegm.  Found to have an apical lesion on her lung on the CT scan.  Also has had some hemoptysis during the process.  She has a history of end-stage renal disease on hemodialysis.  I have particularly been called to evaluate her for anemia.  I do note that when she was admitted with a variceal bleed in May 2020 hemoglobin was around 4 g.  But on 6/26 and 02/24/2019 her hemoglobin has been close to 8 g.  On admission as well as today the hemoglobin is very similar between 7.7 and 8 g.  There has been a large shift in fluid volume due to dialysis which can account for a small change from her baseline.  In terms of a variceal bleed usually presents with a large quantity of blood loss.  Which has not been present in her history.  Presently there are no other issues with her swallowing as she was having a lunch with some steak when I went in to see her.  Plan 1.  Monitor CBC and if there is a drop from her baseline to please contact me.  Monitor stool for melena.  She does not require stool occult blood testing which is a test for colon cancer screening and does not rule in or rule out a gastrointestinal hemorrhage.  Particularly stool occult testing can be false negative for upper GI bleeds.  2.  I do note that she has a nodule in her right upper lobe which would require further evaluation and may contribute towards her hemoptysis.  Thank you for involving me in the care of this patient.      LOS: 3  days   Marie Bellows, MD  03/20/2019, 12:25 PM

## 2019-03-20 NOTE — Progress Notes (Signed)
  Speech Language Pathology Treatment: Dysphagia  Patient Details Name: [name] MRN: [id number] DOB: June 13, 1941 Today's Date: 03/20/2019 Time: 6546-5035 SLP Time Calculation (min) (ACUTE ONLY): 27 min  Assessment / Plan / Recommendation Clinical Impression  Pt presents with a minimal risk for aspiration as characterized by her appropriate ability to masticate regular solids and thin liquids. Pt had appropriate oral transit and bolus formation. Pt had complaints of food texture modification and requested regular diet. ST educated pt that she was only modified per her sore throat and giving pharyngeal space appropriate time to heal. Pt denies pain in pharyngeal space at this time. Pt was able to intake thin liquids and regular solids as requested with no overt ssx aspiration. ST educated pt on aspiration precautions and to eat and drink slowly.  ST will follow up x 1 session to ensure safety with regular diet. Diet order was placed.    HPI        SLP Plan  Continue with current plan of care       Recommendations  Diet recommendations: Regular;Thin liquid Liquids provided via: Straw Medication Administration: Crushed with puree Supervision: Patient able to self feed Compensations: Slow rate;Small sips/bites;Follow solids with liquid Postural Changes and/or Swallow Maneuvers: Seated upright 90 degrees                SLP Visit Diagnosis: Dysphagia, pharyngeal phase (R13.13) Plan: Continue with current plan of care       GO                [place] 03/20/2019, 11:56 AM

## 2019-03-21 DIAGNOSIS — N186 End stage renal disease: Secondary | ICD-10-CM

## 2019-03-21 DIAGNOSIS — Z515 Encounter for palliative care: Secondary | ICD-10-CM

## 2019-03-21 DIAGNOSIS — K7469 Other cirrhosis of liver: Secondary | ICD-10-CM

## 2019-03-21 DIAGNOSIS — R911 Solitary pulmonary nodule: Secondary | ICD-10-CM

## 2019-03-21 DIAGNOSIS — Z992 Dependence on renal dialysis: Secondary | ICD-10-CM

## 2019-03-21 DIAGNOSIS — D631 Anemia in chronic kidney disease: Secondary | ICD-10-CM

## 2019-03-21 LAB — BPAM RBC
Blood Product Expiration Date: 202008072359
Blood Product Expiration Date: 202008082359
ISSUE DATE / TIME: 202007191456
ISSUE DATE / TIME: 202007211925
Unit Type and Rh: 6200
Unit Type and Rh: 6200

## 2019-03-21 LAB — CBC
HCT: 26.5 % — ABNORMAL LOW (ref 36.0–46.0)
Hemoglobin: 8.5 g/dL — ABNORMAL LOW (ref 12.0–15.0)
MCH: 28.3 pg (ref 26.0–34.0)
MCHC: 32.1 g/dL (ref 30.0–36.0)
MCV: 88.3 fL (ref 80.0–100.0)
Platelets: 126 10*3/uL — ABNORMAL LOW (ref 150–400)
RBC: 3 MIL/uL — ABNORMAL LOW (ref 3.87–5.11)
RDW: 15.4 % (ref 11.5–15.5)
WBC: 10.5 10*3/uL (ref 4.0–10.5)
nRBC: 0 % (ref 0.0–0.2)

## 2019-03-21 LAB — GLUCOSE, CAPILLARY
Glucose-Capillary: 171 mg/dL — ABNORMAL HIGH (ref 70–99)
Glucose-Capillary: 103 mg/dL — ABNORMAL HIGH (ref 70–99)
Glucose-Capillary: 313 mg/dL — ABNORMAL HIGH (ref 70–99)
Glucose-Capillary: 99 mg/dL (ref 70–99)

## 2019-03-21 LAB — TYPE AND SCREEN
ABO/RH(D): A POS
Antibody Screen: NEGATIVE
Unit division: 0
Unit division: 0

## 2019-03-21 MED ORDER — PRO-STAT SUGAR FREE PO LIQD
30.0000 mL | Freq: Two times a day (BID) | ORAL | Status: DC
  Administered 2019-03-21 – 2019-03-22 (×2): 30 mL via ORAL

## 2019-03-21 MED ORDER — PANTOPRAZOLE SODIUM 40 MG PO TBEC
40.0000 mg | DELAYED_RELEASE_TABLET | Freq: Two times a day (BID) | ORAL | Status: DC
  Administered 2019-03-21 – 2019-03-22 (×2): 40 mg via ORAL
  Filled 2019-03-21 (×2): qty 1

## 2019-03-21 NOTE — Consult Note (Signed)
Consultation Note Date: 03/21/2019   Patient Name: [name]  DOB: 1941-03-10  MRN: [id number]  Age / Sex: 78 y.o., female  PCP: Caryl Bis, MD Referring Physician: Vaughan Basta, *  Reason for Consultation: Establishing goals of care  HPI/Patient Profile: 78 y.o. female  with past medical history of ESRD training for peritoneal dialysis, NASH liver cirrhosis, Takotsubo cardiomyopathy, renal cell carcinoma, hypertension, hypercholesterolemia, hiatal hernia, GERD, migraines, anemia of chronic disease admitted on 03/17/2019 with weakness and cough. Recent hospitalization in May 2020 for variceal bleed and underwent band ligation. Subsequently had repeat EDG on 02/22/2019 and found to have small esophageal varices too small to band. This admission, patient experiencing hemoptysis. CT chest revealed spiculated pulmonary nodule RUL measuring 1.8cm. Pulmonology consulted. Also with cirrhosis, splenomegaly and ascites. GI and nephrology following. Palliative medicine consultation for goals of care.   Clinical Assessment and Goals of Care:  I have reviewed medical records, discussed with care team and met with patient at bedside. She is awake, alert, oriented and able to participate in [place] discussion.  I introduced Palliative Medicine as specialized medical care for people living with serious illness. It focuses on providing relief from the symptoms and stress of a serious illness. The goal is to improve quality of life for both the patient and the family.  We discussed a brief life review of the patient. Prior to hospitalization in May, patient was living independently. Since that time, daughter [name]) stays with her every night and is actively involved in her medical care. She has been training for peritoneal dialysis and speaks of frustrations with long drive to [facility] for dialysis treatments.    Dialysis is new as of recent hospitalization. She reports being diagnosed with the NASH cirrhosis about 4 or 5 years ago and was surprised by this.  Discussed course of hospitalization including diagnoses and interventions. Discussed results from CT and her conversation with pulmonology. Patient speaks of her decision to pursue outpatient f/u including biopsy and possible options depending on results from biopsy. She reports family history of colon and pancreatic cancer.   Patient had dialysis this afternoon and reports she is tired and has a headache. (Recently given tylenol). She wishes to rest but agreeable with PMT provider follow-up tomorrow morning. She again shares that her daughter, [name], is actively involved in her care and available to talk via telephone.    SUMMARY OF RECOMMENDATIONS    Initial PMT discussion. Introduced palliative medicine and developed patient rapport.  Patient wishes for medical optimization and f/u with outpatient pulmonology regarding biopsy and possible options following biopsy results.   Patient had dialysis today and did not want to talk long. PMT provider will f/u in AM to further discuss advance directives.   Code Status/Advance Care Planning:  DNR  Symptom Management:   Per attending  Palliative Prophylaxis:   Aspiration, Delirium Protocol, Frequent Pain Assessment and Oral Care  Psycho-social/Spiritual:   Desire for further Chaplaincy support: yes  Additional Recommendations: Caregiving  Support/Resources  Prognosis:  Unable to determine  Discharge Planning: To Be Determined      Primary Diagnoses: Present on Admission: . Hyperkalemia   I have reviewed the medical record, interviewed the patient and family, and examined the patient. The following aspects are pertinent.  Past Medical History:  Diagnosis Date  . 10 year risk of MI or stroke 7.5% or greater   . Anemia   . Ascites   . Bursitis   . CKD (chronic kidney  disease)   . Diabetes mellitus ([place])   . Esophageal varices ([place])   . ESRD (end stage renal disease) ([place]) 12/2018   to start dialysis  . FHx: migraine headaches   . Gastritis and duodenitis   . GERD (gastroesophageal reflux disease)   . Hiatal hernia   . Hypercholesterolemia   . Hypertension   . Kidney stone   . Liver cirrhosis ([place])   . Melena   . Portal hypertension ([place])   . Renal cell carcinoma ([place])   . Renal lithiasis   . Takotsubo cardiomyopathy 06/2013   Social History   Socioeconomic History  . Marital status: Divorced    Spouse name: Not on file  . Number of children: 3  . Years of education: Not on file  . Highest education level: Not on file  Occupational History  . 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 Financial resource strain: Not on file  . Food insecurity    Worry: Not on file    Inability: Not on file  . Transportation needs    Medical: Not on file    Non-medical: Not on file  Tobacco Use  . Smoking status: Former Smoker    Packs/day: 1.00    Years: 43.00    Pack years: 43.00    Types: Cigarettes    Start date: 08/31/1959    Quit date: 08/30/2002    Years since quitting: 16.5  . Smokeless tobacco: Never Used  . Tobacco comment: Quit x 10 years  Substance and Sexual Activity  . Alcohol use: No  . Drug use: No  . Sexual activity: Not on file  Lifestyle  . Physical activity    Days per week: Not on file    Minutes per session: Not on file  . Stress: Not on file  Relationships  . Social Herbalist on phone: Not on file    Gets together: Not on file    Attends religious service: Not on file    Active member of club or organization: Not on file    Attends meetings of clubs or organizations: Not on file    Relationship status: Not on file  Other Topics Concern  . Not on file  Social History Narrative  . Not on file   Family History  Problem Relation Age of Onset  . Pancreatic cancer Father 61   . Colon cancer Mother        late 104s   Scheduled Meds: . sodium chloride   Intravenous Once  . buPROPion  100 mg Oral Daily  . Chlorhexidine Gluconate Cloth  6 each Topical Q0600  . cholecalciferol  2,000 Units Oral Daily  . doxycycline  100 mg Oral BID  . feeding supplement (PRO-STAT SUGAR FREE 64)  30 mL Oral BID  . FLUoxetine  40 mg Oral Daily  . insulin aspart  0-5 Units Subcutaneous QHS  . insulin aspart  0-9 Units Subcutaneous TID WC  . ipratropium-albuterol  3 mL Nebulization BID  . lactulose  10 g Oral BID  . magnesium oxide  400 mg Oral BID  . midodrine  10 mg Oral TID PC  . multivitamin  1 tablet Oral QHS  . multivitamin-lutein  1 capsule Oral Daily  . pantoprazole  40 mg Oral BID AC  . pneumococcal 23 valent vaccine  0.5 mL Intramuscular Tomorrow-1000  . traZODone  100 mg Oral QHS  . vitamin B-12  500 mcg Oral Daily  . vitamin C  500 mg Oral BID   Continuous Infusions: PRN Meds:.acetaminophen **OR** acetaminophen, guaiFENesin-dextromethorphan, menthol-cetylpyridinium, ondansetron **OR** ondansetron (ZOFRAN) IV, phenol Medications Prior to Admission:  Prior to Admission medications   Medication Sig Start Date End Date Taking? Authorizing Provider  acetaminophen (TYLENOL) 500 MG tablet Take 500 mg by mouth every 6 (six) hours as needed for mild pain or fever.   Yes [provider]  B Complex-C-Folic Acid (NEPHRO VITAMINS) 0.8 MG TABS Take 1 tablet by mouth daily. 01/19/19  Yes [provider]  buPROPion (WELLBUTRIN SR) 100 MG 12 hr tablet Take 100 mg by mouth daily.    Yes [provider]  cholecalciferol (VITAMIN D) 1000 UNITS tablet Take 2,000 Units by mouth daily.   Yes [provider]  ciprofloxacin (CIPRO) 250 MG tablet Take 250 mg by mouth daily. 02/08/19  Yes [provider]  FLUoxetine (PROZAC) 40 MG capsule Take 40 mg by mouth daily.   Yes [provider]  lactulose (CHRONULAC) 10 GM/15ML solution Take 10 g  by mouth 2 (two) times daily.   Yes [provider]  magnesium oxide (MAG-OX) 400 MG tablet Take 400 mg by mouth 2 (two) times daily.   Yes [provider]  midodrine (PROAMATINE) 10 MG tablet Take 10 mg by mouth 3 (three) times daily after meals. 02/26/19  Yes [provider]  Multiple Vitamins-Minerals (PRESERVISION AREDS) TABS Take 1 tablet by mouth daily.    Yes [provider]  nystatin (MYCOSTATIN) 100000 UNIT/ML suspension Take 5 mLs by mouth 3 (three) times daily. 02/26/19  Yes [provider]  pantoprazole (PROTONIX) 40 MG tablet Take 40 mg by mouth 2 (two) times daily. 02/26/19  Yes [provider]  sulfamethoxazole-trimethoprim (BACTRIM DS) 800-160 MG tablet Take 1 tablet by mouth every other day. 03/15/19  Yes [provider]  torsemide (DEMADEX) 20 MG tablet Take 40 mg by mouth daily.  02/28/19  Yes [provider]  traZODone (DESYREL) 50 MG tablet Take 100 mg by mouth at bedtime.    Yes [provider]  vitamin B-12 (CYANOCOBALAMIN) 500 MCG tablet Take 500 mcg by mouth daily.   Yes [provider]   Allergies  Allergen Reactions  . Codeine Other (See Comments)    Reaction unknown   . Milk-Related Compounds Nausea And Vomiting and Other (See Comments)    Pt. does not like it. Not allergic to it.   Review of Systems  Neurological: Positive for weakness.   Physical Exam Vitals signs and nursing note reviewed.  Constitutional:      General: She is awake.  HENT:     Head: Normocephalic and atraumatic.  Pulmonary:     Effort: No tachypnea, accessory muscle usage or respiratory distress.  Skin:    General: Skin is warm and dry.  Neurological:     Mental Status: She is alert and oriented to person, place, and time.  Psychiatric:        Mood and Affect: Mood  normal.        Speech: Speech normal.        Behavior: Behavior normal.        Cognition and Memory: Cognition normal.    Vital  Signs: BP 99/62 (BP Location: Right Arm)   Pulse 99   Temp 97.9 F (36.6 C) (Axillary)   Resp 19   Ht '5\' 2"'  (1.575 m)   Wt 42.2 kg   SpO2 100%   BMI 17.01 kg/m  Pain Scale: 0-10   Pain Score: 0-No pain   SpO2: SpO2: 100 % O2 Device:SpO2: 100 % O2 Flow Rate: .O2 Flow Rate (L/min): 2.5 L/min  IO: Intake/output summary:   Intake/Output Summary (Last 24 hours) at 03/21/2019 1553 Last data filed at 03/21/2019 1310 Gross per 24 hour  Intake 424 ml  Output 1300 ml  Net -876 ml    LBM: Last BM Date: 03/20/19 Baseline Weight: Weight: 42.2 kg Most recent weight: Weight: 42.2 kg     Palliative Assessment/Data: PPS 50%     Time In: 1220-1240, 8264-1583 Time Total: 60 Greater than 50%  of this time was spent counseling and coordinating care related to the above assessment and plan.  Signed by:  Ihor Dow, DNP, FNP-C Palliative Medicine Team  Phone: [phone] Fax: [phone]   Please contact Palliative Medicine Team phone at [phone] for questions and concerns.  For individual provider: See Shea Evans

## 2019-03-21 NOTE — Progress Notes (Signed)
Nutrition Follow Up Note  RD working remotely.  DOCUMENTATION CODES:   Underweight  INTERVENTION:   Discontinue Nepro supplements  Add Prostat liquid protein PO 30 ml BID with meals, each supplement provides 100 kcal, 15 grams protein.  Rena-vite to be given QHS.   Vitamin C 500mg  po BID   NUTRITION DIAGNOSIS:   Increased nutrient needs related to catabolic illness(ESRD, cirrhosis) as evidenced by increased estimated needs.  GOAL:   Patient will meet greater than or equal to 90% of their needs -progressing   MONITOR:   PO intake, Supplement acceptance, Labs, Weight trends, Skin, I & O's  ASSESSMENT:   78 year old female with PMHx of GERD, DM, HTN, Takotsubo cardiomyopathy, liver cirrhosis, portal hypertension, esophageal varices, melena, hx RCC, ESRD training to do PD who is admitted with hyperkalemia, PD, wheeze and possible aspiration, also with small wound growing staph aureus.   Pt with good appetite and oral intake; pt eating 100% of meals in hospital. Pt is refusing Nepro supplements and reports that she does not like milky products; RD will discontinue Nepro and try Prostat supplement instead. No new weights since 7/18; will request daily weights in setting on PD.   Medications reviewed and include: D3, doxycycline, insulin, lactulose, Mg oxide, rena-vite, ocuvite, B12   Labs reviewed: K 3.8 wnl, BUN 43(H), creat 5.91(H) Iron 31, TIBC 270, ferritin 284- 7/18 Hgb 8.5(L), Hct 26.5(L) cbgs- 154, 181, 140, 172, 171 x 24 hrs  Diet Order:   Diet Order            Diet Carb Modified Fluid consistency: Thin; Room service appropriate? Yes; Fluid restriction: 1200 mL Fluid  Diet effective now             EDUCATION NEEDS:   No education needs have been identified at this time  Skin:  Skin Assessment: Skin Integrity Issues:(MSAD to buttocks; per MD note there is a small wound growing staph aureus but unsure of location)  Last BM:  7/22- type 6  Height:   Ht  Readings from Last 1 Encounters:  03/17/19 5\' 2"  (1.575 m)   Weight:   Wt Readings from Last 1 Encounters:  03/17/19 42.2 kg   Ideal Body Weight:  50 kg  BMI:  Body mass index is 17.01 kg/m.  Estimated Nutritional Needs:   Kcal:  1500-1700  Protein:  70-80 grams  Fluid:  UOP + 1 L  Koleen Distance MS, RD, LDN Pager #- [phone] Office#- [phone] After Hours Pager: [phone]

## 2019-03-21 NOTE — Progress Notes (Signed)
Pre hemodialysis report received from Chauncy Passy, RN. Received patient in acute room via bed. Awake, alert and verbally responsive. No acute distress noted. CVC without signs and symptoms of infection.  Dressing in place and uncompromised. Accessed CVC per policy and found patent on each side. Hemodialysis treatment initiated at 0939. Plan for 3.5 hours treatment with UF of 1 as tolerated.    03/21/19 4098  Hand-Off documentation  Report received from (Full Name) Delbert Harness, RN  Vital Signs  Temp 98.5 F (36.9 C)  Temp Source Oral  Pulse Rate 88  Pulse Rate Source Monitor  Resp (!) 22  BP (!) 101/59  BP Location Right Arm  BP Method Automatic  Patient Position (if appropriate) Lying  Oxygen Therapy  SpO2 98 %  O2 Device Room Air  Time-Out for Hemodialysis  What Procedure? hemodialysis  Pt Identifiers(min of two) First/Last Name;MRN/Account#  Correct Site? Yes  Correct Side? Yes  Correct Procedure? Yes  Consents Verified? Yes  Safety Precautions Reviewed? Yes  Engineer, civil (consulting) Number 5  Station Number 4  UF/Alarm Test Passed  Conductivity: Meter 13.8  Conductivity: Machine  14  pH 7.4  Reverse Osmosis main  Normal Saline Lot Number [id number]  Dialyzer Lot Number [id number]  Disposable Set Lot Number [id number]  Machine Temperature 98.6 F (37 C)  Musician and Audible Yes  Blood Lines Intact and Secured Yes  Pre Treatment Patient Checks  Vascular access used during treatment Catheter  HD catheter dressing before treatment WDL  Hepatitis B Surface Antigen Results Negative  Date Hepatitis B Surface Antigen Drawn 03/06/19  Hepatitis B Surface Antibody  (susceptible)  Date Hepatitis B Surface Antibody Drawn 03/06/19  Hemodialysis Consent Verified Yes  Hemodialysis Standing Orders Initiated Yes  ECG (Telemetry) Monitor On Yes  Prime Ordered Normal Saline  Length of  DialysisTreatment -hour(s) 3.5 Hour(s)  Dialyzer Elisio 17H NR  Dialysate 2K;2.5 Ca   Dialysate Flow Ordered 800  Blood Flow Rate Ordered 400 mL/min  Ultrafiltration Goal 1000 Liters  During Hemodialysis Assessment  Blood Flow Rate (mL/min) 400 mL/min  Arterial Pressure (mmHg) -180 mmHg  Venous Pressure (mmHg) 150 mmHg  Transmembrane Pressure (mmHg) 50 mmHg  Ultrafiltration Rate (mL/min) 430 mL/min  Dialysate Flow Rate (mL/min) 800 ml/min  Conductivity: Machine  13.9  HD Safety Checks Performed Yes  Dialysis Fluid Bolus Normal Saline  Bolus Amount (mL) 250 mL (prime)  Intra-Hemodialysis Comments Tx initiated

## 2019-03-21 NOTE — Progress Notes (Signed)
Hemodialysis treatment completed at 1310. Net UF removed 1L.  Blood rinsed back, CVC flushed with NS and locked with heparin. Red caps replaced. Dressing remains intact and insertion site remains without signs and symptoms of infection.  Post hemodialysis report given to J. Doughtery, RN.    03/21/19 1310  Hand-Off documentation  Report given to (Full Name) Waldo Laine, RN  Vital Signs  Temp 97.9 F (36.6 C)  Temp Source Axillary  Pulse Rate 99  Resp 19  BP 99/62  BP Location Right Arm  BP Method Automatic  Oxygen Therapy  SpO2 100 %  O2 Device Room Air  Pain Assessment  Pain Scale 0-10  Pain Score 0  During Hemodialysis Assessment  Blood Flow Rate (mL/min) 400 mL/min  Arterial Pressure (mmHg) -200 mmHg  Venous Pressure (mmHg) 190 mmHg  Transmembrane Pressure (mmHg) 60 mmHg  Ultrafiltration Rate (mL/min) 430 mL/min  Dialysate Flow Rate (mL/min) 800 ml/min  Conductivity: Machine  13.9  HD Safety Checks Performed Yes  Dialysis Fluid Bolus Normal Saline  Bolus Amount (mL) 250 mL (rinseback)  Intra-Hemodialysis Comments Progressing as prescribed;Tx completed  Post-Hemodialysis Assessment  Rinseback Volume (mL) 500 mL  KECN 77.7 V  Dialyzer Clearance Clear  Duration of HD Treatment -hour(s) 3.5 hour(s)  Hemodialysis Intake (mL) 500 mL  UF Total -Machine (mL) 1500 mL  Net UF (mL) 1000 mL  Tolerated HD Treatment Yes  Education / Care Plan  Dialysis Education Provided Yes  Documented Education in Care Plan Yes  Outpatient Plan of Care Reviewed and on Chart Yes  Hemodialysis Catheter Right Subclavian  No Placement Date or Time found.   Placed prior to admission: Yes  Orientation: Right  Access Location: Subclavian  Site Condition No complications  Blue Lumen Status Heparin locked;Capped (Central line)  Red Lumen Status Heparin locked;Capped (Central line)  Dressing Type Biopatch;Occlusive  Dressing Status Clean;Dry;Intact;Dressing changed  Drainage Description None   Post treatment catheter status Capped and Clamped

## 2019-03-21 NOTE — Therapy (Signed)
SLP  Note  Patient Details Name: [name] MRN: [id number] DOB: [birth year]   SLP discussed pt care with RN. PT at HD however RN stated pt had no difficulties with morning meal and ate 100% with no complaints. ST to follow up 7/23.  Continue Reg with thin diet       [place] Sauber 03/21/2019, 10:56 AM

## 2019-03-21 NOTE — Progress Notes (Signed)
Pre hemodialysis vitals   03/21/19 0934  Vital Signs  Temp 98.5 F (36.9 C)  Temp Source Oral  Pulse Rate 88  Pulse Rate Source Monitor  Resp (!) 22  BP (!) 92/57  BP Location Right Arm  BP Method Automatic  Patient Position (if appropriate) Lying

## 2019-03-21 NOTE — Progress Notes (Signed)
Pulmonary Medicine          Date: 03/21/2019,   MRN# [id number] [name] 1940/12/30     AdmissionWeight: 42.2 kg                 CurrentWeight: 42.2 kg      CHIEF COMPLAINT:   Non massive hemoptysis and aspiration on pills.    SUBJECTIVE   Patient is breathing without distress at baseline.   She is s/p palliative care evaluation. Dr Anselm Jungling also had long goals of care discussion with her to highligh overall poor prongnosis.   I will sign off and am available if any questions arise , thank you all for collaboration.     PAST MEDICAL HISTORY   Past Medical History:  Diagnosis Date  . 10 year risk of MI or stroke 7.5% or greater   . Anemia   . Ascites   . Bursitis   . CKD (chronic kidney disease)   . Diabetes mellitus ([place])   . Esophageal varices ([place])   . ESRD (end stage renal disease) ([place]) 12/2018   to start dialysis  . FHx: migraine headaches   . Gastritis and duodenitis   . GERD (gastroesophageal reflux disease)   . Hiatal hernia   . Hypercholesterolemia   . Hypertension   . Kidney stone   . Liver cirrhosis ([place])   . Melena   . Portal hypertension ([place])   . Renal cell carcinoma ([place])   . Renal lithiasis   . Takotsubo cardiomyopathy 06/2013     SURGICAL HISTORY   Past Surgical History:  Procedure Laterality Date  . ABDOMINAL HYSTERECTOMY    . BACK SURGERY    . CHOLECYSTECTOMY    . COLONOSCOPY  06/15/2005   RMR: Minimal internal hemorrhoids. Otherwise normal rectum/Normal colon  . COLONOSCOPY    12/14/2001   RMR: Internal hemorrhage, otherwise, normal rectum/ Polyps at 30 and 35 cm resected; the remainder of colonic mucosa appeared normal  . COLONOSCOPY  05/2009   RMR: minimal hemorrhoids, sigmoid hyperplastic polyp  . COLONOSCOPY N/A 10/24/2013   KGY:JEHUDJSH colonic polyps removed as described above/Colonic diverticulosis  . ESOPHAGEAL VARICE LIGATION  12/2018   [facility]  . ESOPHAGOGASTRODUODENOSCOPY  06/12/2007    FWY:OVZCHY esophagus/patulous EG junction, moderate sized  hiatal hernia, otherwise normal stomach, D1, D2  . ESOPHAGOGASTRODUODENOSCOPY N/A 10/24/2013   RMR: Small hiatal hernia; otherwise, normal EGD  . HIP SURGERY     right  . LEFT HEART CATHETERIZATION WITH CORONARY ANGIOGRAM N/A 07/02/2013   Procedure: LEFT HEART CATHETERIZATION WITH CORONARY ANGIOGRAM;  Surgeon: Josue Hector, MD;  Location: [facility] CATH LAB;  Service: Cardiovascular;  Laterality: N/A;  . PARTIAL HYSTERECTOMY    . TENCKHOFF CATHETER INSERTION       FAMILY HISTORY   Family History  Problem Relation Age of Onset  . Pancreatic cancer Father 104  . Colon cancer Mother        late 52s     SOCIAL HISTORY   Social History   Tobacco Use  . Smoking status: Former Smoker    Packs/day: 1.00    Years: 43.00    Pack years: 43.00    Types: Cigarettes    Start date: 08/31/1959    Quit date: 08/30/2002    Years since quitting: 16.5  . Smokeless tobacco: Never Used  . Tobacco comment: Quit x 10 years  Substance Use Topics  . Alcohol use: No  . Drug use: No  MEDICATIONS    Home Medication:    Current Medication:  Current Facility-Administered Medications:  .  0.9 %  sodium chloride infusion (Manually program via Guardrails IV Fluids), , Intravenous, Once, Vaughan Basta, MD .  acetaminophen (TYLENOL) tablet 650 mg, 650 mg, Oral, Q6H PRN, 650 mg at 03/21/19 1356 **OR** acetaminophen (TYLENOL) suppository 650 mg, 650 mg, Rectal, Q6H PRN, Leslye Peer, Richard, MD .  buPROPion [facility] SR) 12 hr tablet 100 mg, 100 mg, Oral, Daily, Leslye Peer, Richard, MD, 100 mg at 03/21/19 0937 .  Chlorhexidine Gluconate Cloth 2 % PADS 6 each, 6 each, Topical, Q0600, Murlean Iba, MD, 6 each at 03/21/19 [phone] .  cholecalciferol (VITAMIN D3) tablet 2,000 Units, 2,000 Units, Oral, Daily, Loletha Grayer, MD, 2,000 Units at 03/21/19 [phone] .  doxycycline (VIBRA-TABS) tablet 100 mg, 100 mg, Oral, BID, Vaughan Basta, MD, 100  mg at 03/21/19 1958 .  feeding supplement (PRO-STAT SUGAR FREE 64) liquid 30 mL, 30 mL, Oral, BID, Vaughan Basta, MD .  FLUoxetine (PROZAC) capsule 40 mg, 40 mg, Oral, Daily, Leslye Peer, Richard, MD, 40 mg at 03/21/19 7622 .  guaiFENesin-dextromethorphan (ROBITUSSIN DM) 100-10 MG/5ML syrup 15 mL, 15 mL, Oral, Q4H PRN, Loletha Grayer, MD, 15 mL at 03/18/19 2209 .  insulin aspart (novoLOG) injection 0-5 Units, 0-5 Units, Subcutaneous, QHS, Loletha Grayer, MD, 2 Units at 03/17/19 2212 .  insulin aspart (novoLOG) injection 0-9 Units, 0-9 Units, Subcutaneous, TID WC, Loletha Grayer, MD, 7 Units at 03/21/19 1740 .  ipratropium-albuterol (DUONEB) 0.5-2.5 (3) MG/3ML nebulizer solution 3 mL, 3 mL, Nebulization, BID, Vaughan Basta, MD, 3 mL at 03/21/19 1942 .  lactulose (CHRONULAC) 10 GM/15ML solution 10 g, 10 g, Oral, BID, Leslye Peer, Richard, MD, 10 g at 03/21/19 6333 .  magnesium oxide (MAG-OX) tablet 400 mg, 400 mg, Oral, BID, Leslye Peer, Richard, MD, 400 mg at 03/21/19 5456 .  menthol-cetylpyridinium (CEPACOL) lozenge 3 mg, 1 lozenge, Oral, PRN, Candiss Norse, Harmeet, MD .  midodrine (PROAMATINE) tablet 10 mg, 10 mg, Oral, TID PC, Wieting, Richard, MD, 10 mg at 03/21/19 1740 .  multivitamin (RENA-VIT) tablet 1 tablet, 1 tablet, Oral, QHS, Vaughan Basta, MD, 1 tablet at 03/20/19 2138 .  multivitamin-lutein (OCUVITE-LUTEIN) capsule 1 capsule, 1 capsule, Oral, Daily, Leslye Peer, Richard, MD, 1 capsule at 03/21/19 0939 .  ondansetron (ZOFRAN) tablet 4 mg, 4 mg, Oral, Q6H PRN **OR** ondansetron (ZOFRAN) injection 4 mg, 4 mg, Intravenous, Q6H PRN, Loletha Grayer, MD, 4 mg at 03/21/19 0236 .  pantoprazole (PROTONIX) EC tablet 40 mg, 40 mg, Oral, BID AC, Vaughan Basta, MD, 40 mg at 03/21/19 1740 .  phenol (CHLORASEPTIC) mouth spray 2 spray, 2 spray, Mouth/Throat, PRN, Loletha Grayer, MD, 2 spray at 03/19/19 0915 .  pneumococcal 23 valent vaccine (PNU-IMMUNE) injection 0.5 mL, 0.5 mL,  Intramuscular, Tomorrow-1000, Wieting, Richard, MD .  traZODone (DESYREL) tablet 100 mg, 100 mg, Oral, QHS, Wieting, Richard, MD, 100 mg at 03/20/19 2138 .  vitamin B-12 (CYANOCOBALAMIN) tablet 500 mcg, 500 mcg, Oral, Daily, Leslye Peer, Richard, MD, 500 mcg at 03/21/19 0939 .  vitamin C (ASCORBIC ACID) tablet 500 mg, 500 mg, Oral, BID, Vaughan Basta, MD, 500 mg at 03/21/19 2563    ALLERGIES   Codeine     REVIEW OF SYSTEMS    Review of Systems:  Gen:  Denies  fever, sweats, chills weigh loss  HEENT: Denies blurred vision, double vision, ear pain, eye pain, hearing loss, nose bleeds, sore throat Cardiac:  No dizziness, chest pain or heaviness, chest tightness,edema Resp:   Denies cough  or sputum porduction, shortness of breath,wheezing, hemoptysis,  Gi: Denies swallowing difficulty, stomach pain, nausea or vomiting, diarrhea, constipation, bowel incontinence Gu:  Denies bladder incontinence, burning urine Ext:   Denies Joint pain, stiffness or swelling Skin: Denies  skin rash, easy bruising or bleeding or hives Endoc:  Denies polyuria, polydipsia , polyphagia or weight change Psych:   Denies depression, insomnia or hallucinations   Other:  All other systems negative   VS: BP (!) 91/51 (BP Location: Right Arm)   Pulse 93   Temp 98.2 F (36.8 C) (Oral)   Resp 18   Ht 5\' 2"  (1.575 m)   Wt 42.2 kg   SpO2 97%   BMI 17.01 kg/m      PHYSICAL EXAM    GENERAL:NAD, no fevers, chills, no weakness no fatigue HEAD: Normocephalic, atraumatic.  EYES: Pupils equal, round, reactive to light. Extraocular muscles intact. No scleral icterus.  MOUTH: Moist mucosal membrane. Dentition intact. No abscess noted.  EAR, NOSE, THROAT: Clear without exudates. No external lesions.  NECK: Supple. No thyromegaly. No nodules. No JVD.  PULMONARY: decreased breath sounds bilaterally , ctab  CARDIOVASCULAR: S1 and S2. Regular rate and rhythm. No murmurs, rubs, or gallops. No edema. Pedal  pulses 2+ bilaterally.  GASTROINTESTINAL: Soft, nontender, nondistended. No masses. Positive bowel sounds. No hepatosplenomegaly.  MUSCULOSKELETAL: No swelling, clubbing, or edema. Range of motion full in all extremities.  NEUROLOGIC: Cranial nerves II through XII are intact. No gross focal neurological deficits. Sensation intact. Reflexes intact.  SKIN: No ulceration, lesions, rashes, or cyanosis. Skin warm and dry. Turgor intact.  PSYCHIATRIC: Mood, affect within normal limits. The patient is awake, alert and oriented x 3. Insight, judgment intact.       IMAGING      Dg Chest 2 View  Result Date: 03/18/2019 CLINICAL DATA:  Cough. EXAM: CHEST - 2 VIEW COMPARISON:  Radiograph of March 17, 2019. FINDINGS: The heart size and mediastinal contours are within normal limits. Both lungs are clear. Right internal jugular catheter is unchanged in position. Atherosclerosis of thoracic aorta is noted. No pneumothorax or pleural effusion is noted. The visualized skeletal structures are unremarkable. IMPRESSION: No active cardiopulmonary disease. Aortic Atherosclerosis (ICD10-I70.0). Electronically Signed   By: Marijo Conception M.D.   On: 03/18/2019 08:23   Ct Chest Wo Contrast  Result Date: 03/19/2019 CLINICAL DATA:  Hemoptysis, ESRD EXAM: CT CHEST WITHOUT CONTRAST TECHNIQUE: Multidetector CT imaging of the chest was performed following the standard protocol without IV contrast. COMPARISON:  None. FINDINGS: Cardiovascular: Aortic atherosclerosis. Normal heart size. Left coronary artery calcifications. No pericardial effusion. Mediastinum/Nodes: No enlarged mediastinal, hilar, or axillary lymph nodes. Thyroid gland, trachea, and esophagus demonstrate no significant findings. Lungs/Pleura: There is a spiculated pulmonary nodule of the suprahilar right upper lobe measuring 1.8 cm, which abuts and appears to invade an apical segment right upper lobe bronchus (series 4, image 53). No pleural effusion or  pneumothorax. Upper Abdomen: Cirrhotic morphology of the liver, splenomegaly, and ascites. Musculoskeletal: No chest wall mass or suspicious bone lesions identified. IMPRESSION: 1. There is a spiculated pulmonary nodule of the suprahilar right upper lobe measuring 1.8 cm, which abuts and appears to invade an apical segment right upper lobe bronchus (series 4, image 53). Findings are concerning for primary lung malignancy. 2. Cirrhotic morphology of the liver, splenomegaly, and ascites in the included upper abdomen. 3.  Coronary artery disease and aortic atherosclerosis. Electronically Signed   By: Eddie Candle M.D.   On: 03/19/2019 15:42  Dg Chest Port 1 View  Result Date: 03/17/2019 CLINICAL DATA:  Shortness of breath, cough. EXAM: PORTABLE CHEST 1 VIEW COMPARISON:  Radiograph of Jan 10, 2019. FINDINGS: The heart size and mediastinal contours are within normal limits. Both lungs are clear. No pneumothorax or pleural effusion is noted. Right internal jugular dialysis catheter is noted with distal tip in expected position of right atrium. Atherosclerosis of abdominal aorta is noted. The visualized skeletal structures are unremarkable. IMPRESSION: No active disease. Aortic Atherosclerosis (ICD10-I70.0). Electronically Signed   By: Marijo Conception M.D.   On: 03/17/2019 14:44       ASSESSMENT/PLAN   Right upper lung 1.8cm spiculated nodule with non-massive hemoptysis  -Discussed with patient and daughter [name] today.  - if hx of RCC is accurate patient is at higher chance of malignancy  - hemoptysis has resolved at this time - due to significant comorbid conditions including active severe anemia w/ GI bleeding patient will need GI clearance.   - will need to evaluate in pulmonary clinic with PFT and pulm-preoperative evaluation to assure safety with anesthesia  -patient has unintentional weight loss but unsure how much.  - patient wants to proceed with biopsy and is agreeable to outpatient workup       Acute blood loss anemia  - due to upper GI bleed with esophageal varices   - GI on case   - s/p PRBC transfusion   - close monitoring and if becomes unstable would recommend upgrade to MICU   Thank you for allowing me to participate in the care of this patient.   Patient/Family are satisfied with care plan and all questions have been answered.  This document was prepared using Dragon voice recognition software and may include unintentional dictation errors.     Ottie Glazier, M.D.  Division of [place]

## 2019-03-21 NOTE — Progress Notes (Signed)
Pre hemodialysis assessment   03/21/19 0934  Neurological  Level of Consciousness Alert  Orientation Level Oriented X4;Oriented to person;Oriented to place;Oriented to time;Oriented to situation  Respiratory  Respiratory Pattern Regular;Unlabored  Chest Assessment Chest expansion symmetrical  Bilateral Breath Sounds Clear;Diminished  Cardiac  Pulse Regular  Jugular Venous Distention (JVD) No  ECG Monitor Yes  Cardiac Rhythm NSR  Vascular  R Radial Pulse +2  L Radial Pulse +2  Integumentary  Integumentary (WDL) X  Skin Color Appropriate for ethnicity  Skin Condition Dry  Skin Integrity Ecchymosis;Abrasion;Other (Comment);MASD  Ecchymosis Location Arm;Leg  Ecchymosis Location Orientation Right;Left  Musculoskeletal  Musculoskeletal (WDL) X  Generalized Weakness Yes  Gastrointestinal  Bowel Sounds Assessment Active  GU Assessment  Genitourinary (WDL) X  Genitourinary Symptoms Oliguria  Peritoneal Catheter Right lower abdomen  No Placement Date or Time found.   Catheter Location: Right lower abdomen  Site Assessment Clean;Dry;Intact  Drainage Description None  Catheter status Clamped  Dressing Gauze/Drain sponge  Dressing Status Clean;Dry;Intact  Psychosocial  Psychosocial (WDL) WDL  Patient Behaviors Calm;Flat affect  Needs Expressed Physical  Emotional support given Given to patient

## 2019-03-21 NOTE — Progress Notes (Signed)
[facility] NAME: [name]    MR#:  [id number]  DATE OF BIRTH:  April 18, 1941  SUBJECTIVE:  CHIEF COMPLAINT:   Chief Complaint  Patient presents with  . Cough  . Weakness   Came with weakness, Hyperkalemia, chocking on one of her pills.  She have complaint of hemoptysis - noted to have nodule on CT chest.  REVIEW OF SYSTEMS:   CONSTITUTIONAL: No fever,have fatigue or weakness.  EYES: No blurred or double vision.  EARS, NOSE, AND THROAT: No tinnitus or ear pain.  RESPIRATORY: No cough, shortness of breath, wheezing , have hemoptysis.  CARDIOVASCULAR: No chest pain, orthopnea, edema.  GASTROINTESTINAL: No nausea, vomiting, diarrhea or abdominal pain.  GENITOURINARY: No dysuria, hematuria.  ENDOCRINE: No polyuria, nocturia,  HEMATOLOGY: No anemia, easy bruising or bleeding SKIN: No rash or lesion. MUSCULOSKELETAL: No joint pain or arthritis.   NEUROLOGIC: No tingling, numbness, weakness.  PSYCHIATRY: No anxiety or depression.   ROS  DRUG ALLERGIES:   Allergies  Allergen Reactions  . Codeine Other (See Comments)    Reaction unknown     VITALS:  Blood pressure 99/62, pulse 99, temperature 97.9 F (36.6 C), temperature source Axillary, resp. rate 19, height 5\' 2"  (1.575 m), weight 42.2 kg, SpO2 100 %.  PHYSICAL EXAMINATION:  GENERAL:  78 y.o.-year-old patient lying in the bed with no acute distress.  EYES: Pupils equal, round, reactive to light and accommodation. No scleral icterus. Extraocular muscles intact. Conjunctiva pale. HEENT: Head atraumatic, normocephalic. Oropharynx and nasopharynx clear.  NECK:  Supple, no jugular venous distention. No thyroid enlargement, no tenderness.  LUNGS: Normal breath sounds bilaterally, no wheezing, rales,rhonchi or crepitation. No use of accessory muscles of respiration.  CARDIOVASCULAR: S1, S2 normal. No murmurs, rubs, or gallops.  ABDOMEN: Soft, nontender, nondistended. Bowel  sounds present. No organomegaly or mass.  EXTREMITIES: No pedal edema, cyanosis, or clubbing.  NEUROLOGIC: Cranial nerves II through XII are intact. Muscle strength 4/5 in all extremities. Sensation intact. Gait not checked.  PSYCHIATRIC: The patient is alert and oriented x 3.  SKIN: No obvious rash, lesion, or ulcer.   Physical Exam LABORATORY PANEL:   CBC Recent Labs  Lab 03/21/19 0352  WBC 10.5  HGB 8.5*  HCT 26.5*  PLT 126*   ------------------------------------------------------------------------------------------------------------------  Chemistries  Recent Labs  Lab 03/20/19 0305  NA 138  K 3.8  CL 97*  CO2 28  GLUCOSE 183*  BUN 43*  CREATININE 5.91*  CALCIUM 7.8*   ------------------------------------------------------------------------------------------------------------------  Cardiac Enzymes No results for input(s): TROPONINI in the last 168 hours. ------------------------------------------------------------------------------------------------------------------  RADIOLOGY:  No results found.  ASSESSMENT AND PLAN:   Active Problems:   Hyperkalemia   Palliative care by specialist   ESRD (end stage renal disease) on dialysis ([place])   Other cirrhosis of liver ([place])   Lung nodule   Anemia due to chronic kidney disease, on chronic dialysis ([place])  1.  Hyperkalemia.  Treat with Lokelma, IV calcium, IV bicarb, IV insulin and D50. Received HD. Improved.  Patient was recently started on peritoneal dialysis and for last 1 week receiving training for peritoneal dialysis and doing that at home.  We will have her to reconsider this decision as her electrolytes were not well controlled.  2.  Abdominal pain. SBP ruled out.  With new peritoneal dialysis catheter.   send off culture and sensitivity.   give Rocephin.  General fluid have 22 leukocytes so this seems to be chronic fluid.  3.  Small wound growing staph aureus as per nephrology -was given intraperitoneal  vancomycin and oral Bactrim now we can give oral meds, will change to doxycycline.  We may stop after total 10 to 14 days of treatment  4.  Lung nodule/ Mass suspected pneumonia on admission- ruled out.   Repeat chest x-ray does not show any atelectasis.  stop ceftriaxone and azithromycin.    Pt have hemoptysis now- CT chest shows 1.8 cm Nodule in right upper lobe.  Pulm consult-suggesting to plan bronchoscopy as outpatient. As patient have multiple comorbidities, I had a discussion with patient and her daughter and suggested to meet with palliative care to have goals of care as I do not feel she would be a very strong candidate to undergo treatment for that possible lung cancer even if she does do a diagnostic bronchoscopy.  5.  End-stage renal disease.  Patient training to do peritoneal dialysis.  Still has hemodialysis catheter in her right chest. Undergoing hemodialysis while in the hospital, as mentioned above she was recently started on peritoneal dialysis and was receiving at home  6.  Liver cirrhosis on lactulose  She had vericeal bleed in recent past, also had anemia this time, transfused- called GI consult. 7.  Relative hypotension.  Hold Coreg. 8.  History of congestive heart failure and Takotsubo cardiomyopathy.  No signs of congestive heart failure at this time. 9.  Type 2 diabetes mellitus put on sliding scale. 10.  Anemia of chronic disease with history of esophageal varices.  Continue to follow hemoglobin.  Guaiac stools positive.    As hemoglobin is less than 7 given 1 unit of transfusion 03/18/19.  As hemoglobin was gradually dropping given 1 more unit on 03/20/19 She is not a candidate for Epogen due to lung mass. I feel that she would have continued anemia and very poor prognosis due to multiple medical issues.  I had long discussion with patient and her daughter on the phone. I strongly encouraged them to consider palliative care and options for hospice involvement as I fear  she would be at high risk of recurrent admissions once discharged from hospital without dose.  All the records are reviewed and case discussed with Care Management/Social Workerr. Management plans discussed with the patient, family and they are in agreement.  CODE STATUS: DNR  TOTAL TIME TAKING CARE OF THIS PATIENT: 45 minutes.     POSSIBLE D/C IN 1-2 DAYS, DEPENDING ON CLINICAL CONDITION.   Vaughan Basta M.D on 03/21/2019   Between 7am to 6pm - Pager - [phone]  After 6pm go to [email or web address] - password [email or web address] [facility]  Office  [phone]  CC: Primary care physician; Caryl Bis, MD  Note: This dictation was prepared with Dragon dictation along with smaller phrase technology. Any transcriptional errors that result from this process are unintentional.

## 2019-03-21 NOTE — Progress Notes (Signed)
Family Meeting Note  Advance Directive:yes  Today a meeting took place with the Patient and daughter.  The following clinical team members were present during this meeting:MD  The following were discussed:Patient's diagnosis: Liver cirrhosis and esophageal varices, end-stage renal disease on hemodialysis, chronic anemia, GI bleed, lung mass, Patient's progosis: < 6 months and Goals for treatment: DNR  As patient have multiple comorbidities, I had a discussion with patient and her daughter and suggested to meet with palliative care to have goals of care as I do not feel she would be a very strong candidate to undergo treatment for that possible lung cancer even if she does do a diagnostic bronchoscopy.  I feel that she would have continued anemia and very poor prognosis due to multiple medical issues.  I had long discussion with patient and her daughter on the phone. I strongly encouraged them to consider palliative care and options for hospice involvement as I fear she would be at high risk of recurrent admissions once discharged from hospital without dose.  Additional follow-up to be provided: Palliative care, nephrology, pulmonology  Time spent during discussion:20 minutes  Vaughan Basta, MD

## 2019-03-21 NOTE — Progress Notes (Signed)
[place], [place] 03/21/19  Subjective:  Patient seen and evaluated during hemodialysis. Tolerating well. Appreciate pulmonary input regarding lung mass.   Objective:  Vital signs in last 24 hours:  Temp:  [98.1 F (36.7 C)-98.7 F (37.1 C)] 98.5 F (36.9 C) (07/22 0939) Pulse Rate:  [80-89] 88 (07/22 1015) Resp:  [16-22] 19 (07/22 1015) BP: (86-105)/(47-69) 104/66 (07/22 1015) SpO2:  [94 %-100 %] 98 % (07/22 0939)  Weight change:  Filed Weights   03/17/19 1056  Weight: 42.2 kg    Intake/Output:    Intake/Output Summary (Last 24 hours) at 03/21/2019 1106 Last data filed at 03/21/2019 0950 Gross per 24 hour  Intake 424 ml  Output 300 ml  Net 124 ml     Physical Exam: General:  Thin, cachectic, frail, laying in bed  HEENT  moist oral mucous membranes  Neck  supple  Pulm/lungs  bilateral rhonchi, O2 by [place]  CVS/Heart  irregular rhythm  Abdomen:   Soft, distended, BS present  Extremities:  No edema  Neurologic:  Alert, able to answer all questions appropriately  Skin:  No acute rashes  Access:  Right IJ PermCath.  PD catheter       Basic Metabolic Panel:  Recent Labs  Lab 03/17/19 1130 03/18/19 0451 03/19/19 1429 03/20/19 0305  NA 128* 128* 139 138  K 5.7* 4.4 2.8* 3.8  CL 90* 92* 99 97*  CO2 20* 20* 27 28  GLUCOSE 203* 156* 109* 183*  BUN 88* 94* 27* 43*  CREATININE 11.56* 12.01* 4.30* 5.91*  CALCIUM 7.0* 6.8* 7.9* 7.8*     CBC: Recent Labs  Lab 03/17/19 1130 03/18/19 0451 03/19/19 1429 03/20/19 0305 03/21/19 0352  WBC 13.8* 8.8 7.3 8.5 10.5  HGB 8.0* 6.4* 8.5* 7.7* 8.5*  HCT 24.9* 19.5* 25.8* 24.1* 26.5*  MCV 85.6 84.8 85.1 88.6 88.3  PLT 185 140* 136* 130* 126*     No results found for: HEPBSAG, HEPBSAB, HEPBIGM    Microbiology:  Recent Results (from the past 240 hour(s))  SARS Coronavirus 2 (CEPHEID- Performed in [facility] hospital lab), Hosp Order     Status: None   Collection Time: 03/17/19   2:54 PM   Specimen: Nasopharyngeal Swab  Result Value Ref Range Status   SARS Coronavirus 2 NEGATIVE NEGATIVE Final    Comment: (NOTE) If result is NEGATIVE SARS-CoV-2 target nucleic acids are NOT DETECTED. The SARS-CoV-2 RNA is generally detectable in upper and lower  respiratory specimens during the acute phase of infection. The lowest  concentration of SARS-CoV-2 viral copies this assay can detect is 250  copies / mL. A negative result does not preclude SARS-CoV-2 infection  and should not be used as the sole basis for treatment or other  patient management decisions.  A negative result may occur with  improper specimen collection / handling, submission of specimen other  than nasopharyngeal swab, presence of viral mutation(s) within the  areas targeted by this assay, and inadequate number of viral copies  (<250 copies / mL). A negative result must be combined with clinical  observations, patient history, and epidemiological information. If result is POSITIVE SARS-CoV-2 target nucleic acids are DETECTED. The SARS-CoV-2 RNA is generally detectable in upper and lower  respiratory specimens dur ing the acute phase of infection.  Positive  results are indicative of active infection with SARS-CoV-2.  Clinical  correlation with patient history and other diagnostic information is  necessary to determine patient infection status.  Positive results do  not rule out bacterial infection or co-infection with other viruses. If result is PRESUMPTIVE POSTIVE SARS-CoV-2 nucleic acids MAY BE PRESENT.   A presumptive positive result was obtained on the submitted specimen  and confirmed on repeat testing.  While 2019 novel coronavirus  (SARS-CoV-2) nucleic acids may be present in the submitted sample  additional confirmatory testing may be necessary for epidemiological  and / or clinical management purposes  to differentiate between  SARS-CoV-2 and other Sarbecovirus currently known to infect  humans.  If clinically indicated additional testing with an alternate test  methodology [phone]) is advised. The SARS-CoV-2 RNA is generally  detectable in upper and lower respiratory sp ecimens during the acute  phase of infection. The expected result is Negative. Fact Sheet for Patients:  StrictlyIdeas.no Fact Sheet for Healthcare Providers: BankingDealers.co.za This test is not yet approved or cleared by the [place] FDA and has been authorized for detection and/or diagnosis of SARS-CoV-2 by FDA under an Emergency Use Authorization (EUA).  This EUA will remain in effect (meaning this test can be used) for the duration of the COVID-19 declaration under Section 564(b)(1) of the Act, 21 U.S.C. section 360bbb-3(b)(1), unless the authorization is terminated or revoked sooner. Performed at [facility], [address] [place], [place] [address]   Culture, blood (single) w Reflex to ID Panel     Status: None (Preliminary result)   Collection Time: 03/18/19  9:11 AM   Specimen: BLOOD  Result Value Ref Range Status   Specimen Description BLOOD RIGHT ANTECUBITAL  Final   Special Requests   Final    BOTTLES DRAWN AEROBIC AND ANAEROBIC Blood Culture adequate volume   Culture   Final    NO GROWTH 3 DAYS Performed at [facility], [address] [place], [place] [address]    Report Status PENDING  Incomplete  Body fluid culture     Status: None (Preliminary result)   Collection Time: 03/18/19  9:57 AM   Specimen: Peritoneal Dialysate; Body Fluid  Result Value Ref Range Status   Specimen Description   Final    PERITONEAL DIALYSATE Performed at [facility], [address] [place], [place] [address]    Special Requests   Final    Normal Performed at [facility], [address] [place], [place] [address]    Gram Stain   Final    FEW WBC PRESENT, PREDOMINANTLY PMN NO ORGANISMS SEEN     Culture   Final    NO GROWTH 3 DAYS Performed at [facility] [address], [place], [place] [address]    Report Status PENDING  Incomplete    Coagulation Studies: No results for input(s): LABPROT, INR in the last 72 hours.  Urinalysis: No results for input(s): COLORURINE, LABSPEC, PHURINE, GLUCOSEU, HGBUR, BILIRUBINUR, KETONESUR, PROTEINUR, UROBILINOGEN, NITRITE, LEUKOCYTESUR in the last 72 hours.  Invalid input(s): APPERANCEUR    Imaging: Ct Chest Wo Contrast  Result Date: 03/19/2019 CLINICAL DATA:  Hemoptysis, ESRD EXAM: CT CHEST WITHOUT CONTRAST TECHNIQUE: Multidetector CT imaging of the chest was performed following the standard protocol without IV contrast. COMPARISON:  None. FINDINGS: Cardiovascular: Aortic atherosclerosis. Normal heart size. Left coronary artery calcifications. No pericardial effusion. Mediastinum/Nodes: No enlarged mediastinal, hilar, or axillary lymph nodes. Thyroid gland, trachea, and esophagus demonstrate no significant findings. Lungs/Pleura: There is a spiculated pulmonary nodule of the suprahilar right upper lobe measuring 1.8 cm, which abuts and appears to invade an apical segment right upper lobe bronchus (series 4, image  53). No pleural effusion or pneumothorax. Upper Abdomen: Cirrhotic morphology of the liver, splenomegaly, and ascites. Musculoskeletal: No chest wall mass or suspicious bone lesions identified. IMPRESSION: 1. There is a spiculated pulmonary nodule of the suprahilar right upper lobe measuring 1.8 cm, which abuts and appears to invade an apical segment right upper lobe bronchus (series 4, image 53). Findings are concerning for primary lung malignancy. 2. Cirrhotic morphology of the liver, splenomegaly, and ascites in the included upper abdomen. 3.  Coronary artery disease and aortic atherosclerosis. Electronically Signed   By: Eddie Candle M.D.   On: 03/19/2019 15:42     Medications:    . sodium chloride   Intravenous Once  .  buPROPion  100 mg Oral Daily  . Chlorhexidine Gluconate Cloth  6 each Topical Q0600  . cholecalciferol  2,000 Units Oral Daily  . doxycycline  100 mg Oral BID  . feeding supplement (PRO-STAT SUGAR FREE 64)  30 mL Oral BID  . FLUoxetine  40 mg Oral Daily  . insulin aspart  0-5 Units Subcutaneous QHS  . insulin aspart  0-9 Units Subcutaneous TID WC  . ipratropium-albuterol  3 mL Nebulization BID  . lactulose  10 g Oral BID  . magnesium oxide  400 mg Oral BID  . midodrine  10 mg Oral TID PC  . multivitamin  1 tablet Oral QHS  . multivitamin-lutein  1 capsule Oral Daily  . pneumococcal 23 valent vaccine  0.5 mL Intramuscular Tomorrow-1000  . traZODone  100 mg Oral QHS  . vitamin B-12  500 mcg Oral Daily  . vitamin C  500 mg Oral BID   acetaminophen **OR** acetaminophen, guaiFENesin-dextromethorphan, menthol-cetylpyridinium, ondansetron **OR** ondansetron (ZOFRAN) IV, phenol  Assessment/ Plan:  78 y.o. Caucasian female with end-stage renal disease, cirrhosis with ascites, portal hypertension, history of renal cell cancer, cardiomyopathy, esophageal varices, diabetes presents for generalized weakness, cough, hemoptysis  [name]/PD  1.  End-stage renal disease, on hemodialysis and Patient is undergoing PD training as outpatient She is now admitted for cough, possible aspiration pneumonia Patient has chylous ascites.  PD fluid cell count shows total WBC count of 207 with 59% lymphocytes and only 22% neutrophils.  This is consistent with chronic inflammation not Acute peritonitis.  Body fluid culture still pending. -Patient seen and evaluated at bedside.  She is seen and evaluated during dialysis treatment and tolerating well.  Resume peritoneal dialysis training upon discharge.  2.  Skin infection Wound culture as outpatient was growing MRSA from July 13 She was being treated with intraperitoneal vancomycin starting July 13 Wound is healing well.  Now on oral  doxycycline.  3.  Cough, hemoptysis, generalized weakness Chest CT reveals lung mass.  Appreciate pulmonology input.  It appears that biopsy will have to be performed as an outpatient.  4.  Anemia of CKD. Hold off on Epogen given underlying lung mass.  Transfuse as necessary as per hospitalist.  5.  Hyperkalemia Continue to monitor serum potassium.     LOS: 4 [facility] Bell[facility]omini 7/22/202011:06 AM  [place], [place]  Note: This note was prepared with Dragon dictation. Any transcription errors are unintentional

## 2019-03-22 DIAGNOSIS — Z7189 Other specified counseling: Secondary | ICD-10-CM

## 2019-03-22 LAB — BODY FLUID CULTURE
Culture: NO GROWTH
Special Requests: NORMAL

## 2019-03-22 LAB — GLUCOSE, CAPILLARY
Glucose-Capillary: 162 mg/dL — ABNORMAL HIGH (ref 70–99)
Glucose-Capillary: 230 mg/dL — ABNORMAL HIGH (ref 70–99)

## 2019-03-22 LAB — CBC
HCT: 27.3 % — ABNORMAL LOW (ref 36.0–46.0)
Hemoglobin: 8.7 g/dL — ABNORMAL LOW (ref 12.0–15.0)
MCH: 28.8 pg (ref 26.0–34.0)
MCHC: 31.9 g/dL (ref 30.0–36.0)
MCV: 90.4 fL (ref 80.0–100.0)
Platelets: 123 10*3/uL — ABNORMAL LOW (ref 150–400)
RBC: 3.02 MIL/uL — ABNORMAL LOW (ref 3.87–5.11)
RDW: 15.8 % — ABNORMAL HIGH (ref 11.5–15.5)
WBC: 10.1 10*3/uL (ref 4.0–10.5)
nRBC: 0 % (ref 0.0–0.2)

## 2019-03-22 LAB — RENAL FUNCTION PANEL
Albumin: 2.4 g/dL — ABNORMAL LOW (ref 3.5–5.0)
Anion gap: 13 (ref 5–15)
BUN: 39 mg/dL — ABNORMAL HIGH (ref 8–23)
CO2: 26 mmol/L (ref 22–32)
Calcium: 8 mg/dL — ABNORMAL LOW (ref 8.9–10.3)
Chloride: 97 mmol/L — ABNORMAL LOW (ref 98–111)
Creatinine, Ser: 4.93 mg/dL — ABNORMAL HIGH (ref 0.44–1.00)
GFR calc Af Amer: 9 mL/min — ABNORMAL LOW (ref >60)
GFR calc non Af Amer: 8 mL/min — ABNORMAL LOW (ref >60)
Glucose, Bld: 167 mg/dL — ABNORMAL HIGH (ref 70–99)
Phosphorus: 5.5 mg/dL — ABNORMAL HIGH (ref 2.5–4.6)
Potassium: 4.1 mmol/L (ref 3.5–5.1)
Sodium: 136 mmol/L (ref 135–145)

## 2019-03-22 MED ORDER — DOXYCYCLINE HYCLATE 100 MG PO TABS: 100.0000 mg | ORAL_TABLET | Freq: Two times a day (BID) | ORAL | 0 refills | Status: DC

## 2019-03-22 NOTE — Progress Notes (Signed)
03/22/2019 1:31 PM  [name] to be D/C'd Home per MD order.  Discussed prescriptions and follow up appointments with the patient. Prescriptions given to patient, medication list explained in detail. Pt verbalized understanding.  Allergies as of 03/22/2019      Reactions   Codeine Other (See Comments)   Reaction unknown       Medication List    STOP taking these medications   ciprofloxacin 250 MG tablet Commonly known as: CIPRO   sulfamethoxazole-trimethoprim 800-160 MG tablet Commonly known as: BACTRIM DS     TAKE these medications   acetaminophen 500 MG tablet Commonly known as: TYLENOL Take 500 mg by mouth every 6 (six) hours as needed for mild pain or fever. Notes to patient: As needed   buPROPion 100 MG 12 hr tablet Commonly known as: WELLBUTRIN SR Take 100 mg by mouth daily. Notes to patient: Morning 03/23/19   cholecalciferol 1000 units tablet Commonly known as: VITAMIN D Take 2,000 Units by mouth daily. Notes to patient: Morning 03/23/19   doxycycline 100 MG tablet Commonly known as: VIBRA-TABS Take 1 tablet (100 mg total) by mouth 2 (two) times daily. Notes to patient: Evening 03/22/19   FLUoxetine 40 MG capsule Commonly known as: PROZAC Take 40 mg by mouth daily. Notes to patient: Morning 03/23/19   lactulose 10 GM/15ML solution Commonly known as: CHRONULAC Take 10 g by mouth 2 (two) times daily. Notes to patient: Upon return home   magnesium oxide 400 MG tablet Commonly known as: MAG-OX Take 400 mg by mouth 2 (two) times daily. Notes to patient: Before bed 03/22/19   midodrine 10 MG tablet Commonly known as: PROAMATINE Take 10 mg by mouth 3 (three) times daily after meals. Notes to patient: After lunch 03/22/19   Nephro Vitamins 0.8 MG Tabs Take 1 tablet by mouth daily. Notes to patient: Morning 03/23/19   nystatin 100000 UNIT/ML suspension Commonly known as: MYCOSTATIN Take 5 mLs by mouth 3 (three) times daily. Notes to patient: Before  bed 03/22/19   pantoprazole 40 MG tablet Commonly known as: PROTONIX Take 40 mg by mouth 2 (two) times daily. Notes to patient: Evening 03/22/19   PreserVision AREDS Tabs Take 1 tablet by mouth daily. Notes to patient: Morning 03/23/19   torsemide 20 MG tablet Commonly known as: DEMADEX Take 40 mg by mouth daily. Notes to patient: Morning 03/23/19   traZODone 50 MG tablet Commonly known as: DESYREL Take 100 mg by mouth at bedtime. Notes to patient: Before bed 03/22/19   vitamin B-12 500 MCG tablet Commonly known as: CYANOCOBALAMIN Take 500 mcg by mouth daily. Notes to patient: Morning 03/23/19       Vitals:   03/22/19 0511 03/22/19 1133  BP: 100/60 97/60  Pulse: (!) 103 90  Resp: 16 18  Temp: 98.1 F (36.7 C) 98.9 F (37.2 C)  SpO2: 93% 97%    Skin clean, dry and intact without evidence of skin break down, no evidence of skin tears noted. IV catheter discontinued intact. Site without signs and symptoms of complications. Dressing and pressure applied. Pt denies pain at this time. No complaints noted.  An After Visit Summary was printed and given to the patient. Patient escorted via [place], and D/C home via private auto.  [name]

## 2019-03-22 NOTE — Progress Notes (Signed)
Daily Progress Note   Patient Name: [name]       Date: 03/22/2019 DOB: 1941/01/31  Age: 78 y.o. MRN#: [id number] Attending Physician: Sela Hua, MD Primary Care Physician: Caryl Bis, MD Admit Date: 03/17/2019  Reason for Consultation/Follow-up: Establishing goals of care  Subjective: Patient awake, alert, oriented. Able to participate in conversation. Denies pain or discomfort. Ready to discharge home today.   GOC:  Spoke with daughter, [name] via telephone to introduce role of palliative medicine. Discussed events leading up to admission and course of hospitalization including diagnoses, interventions, and poor long-term prognosis. Discussed her conversations with Dr. Marthann Schiller and Dr. Lanney Gins. [name] acknowledges her mother is very weak with underlying chronic, irreversible conditions. She does not think her mother should pursue lung biopsy, knowing she is likely not a candidate for aggressive oncology recommendations. We did discuss high risk for recurrent hospitalization with underlying ESRD on dialysis, liver cirrhosis, ascites, anemia.  [name] states "she's been through hell and back" this year and expresses how "tough" her mother is. Most importantly, [name] wishes to keep her mother at home. [name] is with [employer] [name] is hopeful to take her mother to the beach "one last time" at the end of August. She confirms her mother's wish for "DNR" code status but encourages me to follow-up with her mother about MOST form. [name] shares she does not wish to see her mother "suffer."   Discussed outpatient palliative versus hospice options and philosophy. [name] is agreeable with outpatient palliative services on discharge.    Shortly after, follow-up with patient at  bedside. [name] is in good spirits and eager for discharge home today.  Again introduced role of palliative medicine. Discussed diagnoses and interventions.   Introduced MOST form. Patient confirms she had a DNR completed at [facility]. Patient is ready to review and complete MOST form today.   Patient wishes include: DNR/DNI, limited interventions including BiPAP/CPAP if indicated, IVF/ABX if indicated, and NO feeding tube. Durable DNR completed. Copies made for chart, patient, and daughter.   Patient states "I have lived a good life" and does acknowledge she is likely too weak for oncology interventions including surgery or chemotherapy. She also shares her hope of going on the family beach trip at the end of August.   Answered questions and concerns.  Length of Stay: 5  Current Medications: Scheduled Meds:  . sodium chloride   Intravenous Once  . buPROPion  100 mg Oral Daily  . Chlorhexidine Gluconate Cloth  6 each Topical Q0600  . cholecalciferol  2,000 Units Oral Daily  . doxycycline  100 mg Oral BID  . feeding supplement (PRO-STAT SUGAR FREE 64)  30 mL Oral BID  . FLUoxetine  40 mg Oral Daily  . insulin aspart  0-5 Units Subcutaneous QHS  . insulin aspart  0-9 Units Subcutaneous TID WC  . ipratropium-albuterol  3 mL Nebulization BID  . lactulose  10 g Oral BID  . magnesium oxide  400 mg Oral BID  . midodrine  10 mg Oral TID PC  . multivitamin  1 tablet Oral QHS  . multivitamin-lutein  1 capsule Oral Daily  . pantoprazole  40 mg Oral BID AC  . pneumococcal 23 valent vaccine  0.5 mL Intramuscular Tomorrow-1000  . traZODone  100 mg Oral QHS  . vitamin B-12  500 mcg Oral Daily  . vitamin C  500 mg Oral BID    Continuous Infusions:   PRN Meds: acetaminophen **OR** acetaminophen, guaiFENesin-dextromethorphan, menthol-cetylpyridinium, ondansetron **OR** ondansetron (ZOFRAN) IV, phenol  Physical Exam Vitals signs and nursing note reviewed.  Constitutional:       General: She is awake.     Appearance: She is cachectic. She is ill-appearing.  HENT:     Head: Normocephalic and atraumatic.  Pulmonary:     Effort: No tachypnea, accessory muscle usage or respiratory distress.  Abdominal:     Tenderness: There is no abdominal tenderness.  Skin:    General: Skin is warm and dry.     Coloration: Skin is pale.  Neurological:     Mental Status: She is alert and oriented to person, place, and time.  Psychiatric:        Attention and Perception: Attention normal.        Mood and Affect: Mood normal.        Speech: Speech normal.        Behavior: Behavior normal.        Cognition and Memory: Cognition normal.            Vital Signs: BP 100/60 (BP Location: Right Arm)   Pulse (!) 103   Temp 98.1 F (36.7 C) (Oral)   Resp 16   Ht 5\' 2"  (1.575 m)   Wt 44.9 kg   SpO2 93%   BMI 18.11 kg/m  SpO2: SpO2: 93 % O2 Device: O2 Device: Room Air O2 Flow Rate: O2 Flow Rate (L/min): 2.5 L/min  Intake/output summary:   Intake/Output Summary (Last 24 hours) at 03/22/2019 0924 Last data filed at 03/21/2019 1310 Gross per 24 hour  Intake 120 ml  Output 1000 ml  Net -880 ml   LBM: Last BM Date: 03/21/19 Baseline Weight: Weight: 42.2 kg Most recent weight: Weight: 44.9 kg       Palliative Assessment/Data: PPS 50%      Patient Active Problem List   Diagnosis Date Noted  . Palliative care by specialist   . ESRD (end stage renal disease) on dialysis ([place])   . Other cirrhosis of liver ([place])   . Lung nodule   . Anemia due to chronic kidney disease, on chronic dialysis ([place])   . Hyperkalemia 03/17/2019  . Abnormal thyroid blood test 11/07/2018  . Uncontrolled type 2 diabetes mellitus with hyperglycemia ([place]) 07/25/2018  . Hyperthyroidism 07/05/2018  . Melena 10/08/2013  .  Cardiomyopathy ([place]) 07/12/2013  . Physical deconditioning 07/06/2013  . NSTEMI (non-ST elevated myocardial infarction) ([place]) 07/02/2013  . Community acquired pneumonia  07/02/2013  . Sepsis(995.91) 07/02/2013  . Acute respiratory failure ([place]) 07/02/2013  . Acute systolic CHF (congestive heart failure) ([place]) 07/02/2013  . 10 year risk of MI or stroke 7.5% or greater   . HEMOCCULT POSITIVE STOOL 05/15/2009  . ANEMIA, IRON DEFICIENCY, HX OF 05/15/2009  . DM 05/14/2009  . HYPERCHOLESTEROLEMIA 05/14/2009  . MIGRAINE HEADACHE 05/14/2009  . HYPERTENSION 05/14/2009  . GERD 05/14/2009  . CHOLELITHIASIS 05/14/2009  . NEPHROLITHIASIS 05/14/2009  . BURSITIS 05/14/2009    Palliative Care Assessment & Plan   Patient Profile: 78 y.o. female  with past medical history of ESRD training for peritoneal dialysis, NASH liver cirrhosis, Takotsubo cardiomyopathy, renal cell carcinoma, hypertension, hypercholesterolemia, hiatal hernia, GERD, migraines, anemia of chronic disease admitted on 03/17/2019 with weakness and cough. Recent hospitalization in May 2020 for variceal bleed and underwent band ligation. Subsequently had repeat EDG on 02/22/2019 and found to have small esophageal varices too small to band. This admission, patient experiencing hemoptysis. CT chest revealed spiculated pulmonary nodule RUL measuring 1.8cm. Pulmonology consulted. Also with cirrhosis, splenomegaly and ascites. GI and nephrology following. Palliative medicine consultation for goals of care.   Assessment: ESRD training for peritoneal dialysis Cirrhosis of liver Anemia of CKD Lung nodule  Recommendations/Plan: MOST form completed with patient. Patient's wishes include: DNR/DNI, limited interventions including re-hospitalization/CPAP/BiPAP if indicated, IVF/ABX if indicated, NO feeding tube. Durable DNR completed. Copies placed in chart and given to patient.  Continue outpatient training for PD. Patient/daughter likely will f/u with outpatient pulmonology but both acknowledge she is too weak with underlying conditions to pursue aggressive oncology interventions.  Patient/daughter hopeful she can go  on family beach trip at the end of August. Outpatient palliative referral. Plan is for discharge home today.   Code Status: DNR/DNI   Code Status Orders  (From admission, onward)         Start     Ordered   03/17/19 1717  Do not attempt resuscitation (DNR)  Continuous    Question Answer Comment  In the event of cardiac or respiratory ARREST Do not call a "code blue"   In the event of cardiac or respiratory ARREST Do not perform Intubation, CPR, defibrillation or ACLS   In the event of cardiac or respiratory ARREST Use medication by any route, position, wound care, and other measures to relive pain and suffering. May use oxygen, suction and manual treatment of airway obstruction as needed for comfort.   Comments nurse may pronounce      03/17/19 1717        Code Status History    Date Active Date Inactive Code Status Order ID Comments User Context   07/02/2013 0141 07/06/2013 1655 Full Code [id number]  Stephani Police, MD Inpatient   Advance Care Planning Activity      Prognosis:  Unable to determine: poor long-term with high risk for recurrent hospitalization.  Discharge Planning: Home with [place] was discussed with patient, daughter [name]), Dr. Brett Albino, RN  Thank you for allowing the Palliative Medicine Team to assist in the care of this patient.   Time In: 0900 Time Out: 1020 Total Time 80 Prolonged Time Billed yes      Greater than 50%  of this time was spent counseling and coordinating care related to the above assessment and plan.  Ihor Dow, DNP, FNP-C Palliative Medicine Team  Phone: [phone] Fax: [phone]  Please contact Palliative Medicine Team phone at [phone] for questions and concerns.

## 2019-03-22 NOTE — Progress Notes (Signed)
New referral for outpatient palliative to follow at home received following a Hospice Palliative follow up visit. Patient information faxed to referral. Flo Shanks BSN, RN, [place] [phone]

## 2019-03-22 NOTE — Progress Notes (Signed)
  Speech Language Pathology Treatment: Dysphagia  Patient Details Name: [name] MRN: [id number] DOB: 02/15/1941 Today's Date: 03/22/2019 Time: 2563-8937 SLP Time Calculation (min) (ACUTE ONLY): 25 min  Assessment / Plan / Recommendation Clinical Impression  Pt presents with no further aspiration risk as evidenced by no overt ssx aspiration during trials of regular with thin. Pt reports that her throat is no longer sore and has had no difficulty with her regular with thin diet. Pt was observed directly with regular solids and thin liquids. Pt had adequate mastication, a to p transit and functional  And timely swallow. Pt had no soughing or complaints of feeling the food was not transitioning adequately. Pt had no wet vocal quality throughout session. SLP provided education to pt to eat slowly, take medications one at a time and with water, and to alternate solids and liquids during intake. Pt was able to give verbal agreement to education provided. Pt dd have concerns about her current diet and inability to have certain foods. SLP educated that that is more medical than swallow as it is heart healthy and no salt. ST educated to discuss that with physician. ST will sign off as pt no longer requires skilled therapeutic intervention form a SLP.          SLP Plan  Discharge SLP treatment due to (comment)       Recommendations  Diet recommendations: Regular;Thin liquid Liquids provided via: Cup;Straw Medication Administration: Whole meds with liquid Supervision: Patient able to self feed Compensations: Slow rate;Small sips/bites;Follow solids with liquid Postural Changes and/or Swallow Maneuvers: Seated upright 90 degrees                SLP Visit Diagnosis: Dysphagia, pharyngeal phase (R13.13) Plan: Discharge SLP treatment due to (comment)       [place] 03/22/2019, 10:15 AM

## 2019-03-22 NOTE — Discharge Instructions (Signed)
It was so nice to meet you during this hospitalization!  I have prescribed the following medications: 1. Doxycycline 100mg - please take 1 tablet twice a day, starting tonight and then continuing for 5 more days (this is an antibiotic for your wound)  Take care, Dr. Brett Albino

## 2019-03-22 NOTE — Discharge Summary (Signed)
[facility] NAME: [name]    MR#:  [id number]  DATE OF BIRTH:  1941-01-27  DATE OF ADMISSION:  03/17/2019   ADMITTING PHYSICIAN: Marie Grayer, MD  DATE OF DISCHARGE: 03/22/19  PRIMARY CARE PHYSICIAN: Marie Bis, MD   ADMISSION DIAGNOSIS:  Hypocalcemia [E83.51] Cough [R05] Hyperkalemia [E87.5] Hyponatremia [E87.1] Weakness [R53.1] SOB (shortness of breath) [R06.02] ESRD (end stage renal disease) on dialysis ([place]) [N18.6, Z99.2] Anemia due to chronic kidney disease, on chronic dialysis ([place]) [N18.6, D63.1, Z99.2] DISCHARGE DIAGNOSIS:  Active Problems:   Hyperkalemia   Palliative care by specialist   ESRD (end stage renal disease) on dialysis ([place])   Other cirrhosis of liver ([place])   Lung nodule   Anemia due to chronic kidney disease, on chronic dialysis ([place])  SECONDARY DIAGNOSIS:   Past Medical History:  Diagnosis Date  . 10 year risk of MI or stroke 7.5% or greater   . Anemia   . Ascites   . Bursitis   . CKD (chronic kidney disease)   . Diabetes mellitus ([place])   . Esophageal varices ([place])   . ESRD (end stage renal disease) ([place]) 12/2018   to start dialysis  . FHx: migraine headaches   . Gastritis and duodenitis   . GERD (gastroesophageal reflux disease)   . Hiatal hernia   . Hypercholesterolemia   . Hypertension   . Kidney stone   . Liver cirrhosis ([place])   . Melena   . Portal hypertension ([place])   . Renal cell carcinoma ([place])   . Renal lithiasis   . Takotsubo cardiomyopathy 06/2013   HOSPITAL COURSE:   [name] is a 78 year old female who presented to the ED with weakness, cough, and sore throat.  She had been training to do peritoneal dialysis at home.  In the ED, she was noted to be hyperkalemic and hyponatremic.  Hospitalists were called for admission.  Hyperkalemia in ESRD- patient had been training to do peritoneal dialysis at home. -Hyperkalemia resolved -Per nephrology, patient  will resume peritoneal dialysis training on discharge  Abdominal wound- growing MRSA as an outpatient.  Had been receiving intraperitoneal vancomycin and oral Bactrim as an outpatient. -Discharged home on doxycycline for a total 10-day course  Lung nodule -CT chest showed a 1.8 cm nodule in the right upper lobe -Pulmonology recommended outpatient bronchoscopy -Patient will follow up with pulmonology as an outpatient in 2 weeks -Patient seen by palliative care- outpatient palliative will follow on discharge  Liver cirrhosis -Home lactulose was continued -Needs to follow-up with GI as an outpatient  History of Takotsubo cardiomyopathy-no signs of volume overload  Type 2 diabetes -Home diabetes medicines were continued on discharge  Anemia of chronic kidney disease -Patient is not any Epogen candidate due to lung mass -She received 2 units PRBC during this hospitalization -Hemoglobin was stable on the day of discharge  Home health RN, PT, OT, and aide ordered on discharge.  DISCHARGE CONDITIONS:  ESRD Abdominal wound Lung nodule Liver cirrhosis History of Takotsubo cardiomyopathy Type 2 diabetes Anemia of chronic kidney disease CONSULTS OBTAINED:  Treatment Team:  Marie Bellows, MD Marie Glazier, MD DRUG ALLERGIES:   Allergies  Allergen Reactions  . Codeine Other (See Comments)    Reaction unknown    DISCHARGE MEDICATIONS:   Allergies as of 03/22/2019      Reactions   Codeine Other (See Comments)   Reaction unknown       Medication List  STOP taking these medications   ciprofloxacin 250 MG tablet Commonly known as: CIPRO   sulfamethoxazole-trimethoprim 800-160 MG tablet Commonly known as: BACTRIM DS     TAKE these medications   acetaminophen 500 MG tablet Commonly known as: TYLENOL Take 500 mg by mouth every 6 (six) hours as needed for mild pain or fever.   buPROPion 100 MG 12 hr tablet Commonly known as: WELLBUTRIN SR Take 100 mg by mouth daily.    cholecalciferol 1000 units tablet Commonly known as: VITAMIN D Take 2,000 Units by mouth daily.   doxycycline 100 MG tablet Commonly known as: VIBRA-TABS Take 1 tablet (100 mg total) by mouth 2 (two) times daily.   FLUoxetine 40 MG capsule Commonly known as: PROZAC Take 40 mg by mouth daily.   lactulose 10 GM/15ML solution Commonly known as: CHRONULAC Take 10 g by mouth 2 (two) times daily.   magnesium oxide 400 MG tablet Commonly known as: MAG-OX Take 400 mg by mouth 2 (two) times daily.   midodrine 10 MG tablet Commonly known as: PROAMATINE Take 10 mg by mouth 3 (three) times daily after meals.   Nephro Vitamins 0.8 MG Tabs Take 1 tablet by mouth daily.   nystatin 100000 UNIT/ML suspension Commonly known as: MYCOSTATIN Take 5 mLs by mouth 3 (three) times daily.   pantoprazole 40 MG tablet Commonly known as: PROTONIX Take 40 mg by mouth 2 (two) times daily.   PreserVision AREDS Tabs Take 1 tablet by mouth daily.   torsemide 20 MG tablet Commonly known as: DEMADEX Take 40 mg by mouth daily.   traZODone 50 MG tablet Commonly known as: DESYREL Take 100 mg by mouth at bedtime.   vitamin B-12 500 MCG tablet Commonly known as: CYANOCOBALAMIN Take 500 mcg by mouth daily.        DISCHARGE INSTRUCTIONS:  1.  Follow-up with PCP in 5 days 2.  Follow-up with pulmonology for discussion of outpatient bronchoscopy 3.  Follow-up with GI in 2 weeks 4.  Take doxycycline twice a day for 5 additional days 5.  Outpatient palliative care to follow on discharge DIET:  Diabetic diet and Renal diet DISCHARGE CONDITION:  Stable ACTIVITY:  Activity as tolerated OXYGEN:  Home Oxygen: No.  Oxygen Delivery: room air DISCHARGE LOCATION:  home   If you experience worsening of your admission symptoms, develop shortness of breath, life threatening emergency, suicidal or homicidal thoughts you must seek medical attention immediately by calling 911 or calling your MD  immediately  if symptoms less severe.  You Must read complete instructions/literature along with all the possible adverse reactions/side effects for all the Medicines you take and that have been prescribed to you. Take any new Medicines after you have completely understood and accpet all the possible adverse reactions/side effects.   Please note  You were cared for by a hospitalist during your hospital stay. If you have any questions about your discharge medications or the care you received while you were in the hospital after you are discharged, you can call the unit and asked to speak with the hospitalist on call if the hospitalist that took care of you is not available. Once you are discharged, your primary care physician will handle any further medical issues. Please note that NO REFILLS for any discharge medications will be authorized once you are discharged, as it is imperative that you return to your primary care physician (or establish a relationship with a primary care physician if you do not have one) for your  aftercare needs so that they can reassess your need for medications and monitor your lab values.   On the day of Discharge:  VITAL SIGNS:  Blood pressure 100/60, pulse (!) 103, temperature 98.1 F (36.7 C), temperature source Oral, resp. rate 16, height 5\' 2"  (1.575 m), weight 44.9 kg, SpO2 93 %. PHYSICAL EXAMINATION:  GENERAL:  78 y.o.-year-old patient lying in the bed with no acute distress.  EYES: Pupils equal, round, reactive to light and accommodation. No scleral icterus. Extraocular muscles intact.  HEENT: Head atraumatic, normocephalic. Oropharynx and nasopharynx clear.  NECK:  Supple, no jugular venous distention. No thyroid enlargement, no tenderness.  LUNGS: Normal breath sounds bilaterally, no wheezing, rales,rhonchi or crepitation. No use of accessory muscles of respiration.  CARDIOVASCULAR: RRR, S1, S2 normal. No murmurs, rubs, or gallops. +RIJ permcath in  place ABDOMEN: Soft, non-tender, non-distended. Bowel sounds present. No organomegaly or mass. +PD catheter in place. EXTREMITIES: No pedal edema, cyanosis, or clubbing.  NEUROLOGIC: Cranial nerves II through XII are intact. +global weakness. Sensation intact. Gait not checked.  PSYCHIATRIC: The patient is alert and oriented x 3.  SKIN: No obvious rash, lesion, or ulcer.  DATA REVIEW:   CBC Recent Labs  Lab 03/22/19 0757  WBC 10.1  HGB 8.7*  HCT 27.3*  PLT 123*    Chemistries  Recent Labs  Lab 03/22/19 0757  NA 136  K 4.1  CL 97*  CO2 26  GLUCOSE 167*  BUN 39*  CREATININE 4.93*  CALCIUM 8.0*     Microbiology Results  Results for orders placed or performed during the hospital encounter of 03/17/19  SARS Coronavirus 2 (CEPHEID- Performed in [facility] hospital lab), Hosp Order     Status: None   Collection Time: 03/17/19  2:54 PM   Specimen: Nasopharyngeal Swab  Result Value Ref Range Status   SARS Coronavirus 2 NEGATIVE NEGATIVE Final    Comment: (NOTE) If result is NEGATIVE SARS-CoV-2 target nucleic acids are NOT DETECTED. The SARS-CoV-2 RNA is generally detectable in upper and lower  respiratory specimens during the acute phase of infection. The lowest  concentration of SARS-CoV-2 viral copies this assay can detect is 250  copies / mL. A negative result does not preclude SARS-CoV-2 infection  and should not be used as the sole basis for treatment or other  patient management decisions.  A negative result may occur with  improper specimen collection / handling, submission of specimen other  than nasopharyngeal swab, presence of viral mutation(s) within the  areas targeted by this assay, and inadequate number of viral copies  (<250 copies / mL). A negative result must be combined with clinical  observations, patient history, and epidemiological information. If result is POSITIVE SARS-CoV-2 target nucleic acids are DETECTED. The SARS-CoV-2 RNA is generally  detectable in upper and lower  respiratory specimens dur ing the acute phase of infection.  Positive  results are indicative of active infection with SARS-CoV-2.  Clinical  correlation with patient history and other diagnostic information is  necessary to determine patient infection status.  Positive results do  not rule out bacterial infection or co-infection with other viruses. If result is PRESUMPTIVE POSTIVE SARS-CoV-2 nucleic acids MAY BE PRESENT.   A presumptive positive result was obtained on the submitted specimen  and confirmed on repeat testing.  While 2019 novel coronavirus  (SARS-CoV-2) nucleic acids may be present in the submitted sample  additional confirmatory testing may be necessary for epidemiological  and / or clinical management purposes  to  differentiate between  SARS-CoV-2 and other Sarbecovirus currently known to infect humans.  If clinically indicated additional testing with an alternate test  methodology [phone]) is advised. The SARS-CoV-2 RNA is generally  detectable in upper and lower respiratory sp ecimens during the acute  phase of infection. The expected result is Negative. Fact Sheet for Patients:  StrictlyIdeas.no Fact Sheet for Healthcare Providers: BankingDealers.co.za This test is not yet approved or cleared by the [place] FDA and has been authorized for detection and/or diagnosis of SARS-CoV-2 by FDA under an Emergency Use Authorization (EUA).  This EUA will remain in effect (meaning this test can be used) for the duration of the COVID-19 declaration under Section 564(b)(1) of the Act, 21 U.S.C. section 360bbb-3(b)(1), unless the authorization is terminated or revoked sooner. Performed at [facility], [address] [place], [place] [address]   Culture, blood (single) w Reflex to ID Panel     Status: None (Preliminary result)   Collection Time: 03/18/19  9:11 AM   Specimen: BLOOD   Result Value Ref Range Status   Specimen Description BLOOD RIGHT ANTECUBITAL  Final   Special Requests   Final    BOTTLES DRAWN AEROBIC AND ANAEROBIC Blood Culture adequate volume   Culture   Final    NO GROWTH 3 DAYS Performed at [facility], [address] [place], [place] [address]    Report Status PENDING  Incomplete  Body fluid culture     Status: None   Collection Time: 03/18/19  9:57 AM   Specimen: Peritoneal Dialysate; Body Fluid  Result Value Ref Range Status   Specimen Description   Final    PERITONEAL DIALYSATE Performed at [facility], [address] [place], [place] [address]    Special Requests   Final    Normal Performed at [facility], [address] [place], [place] [address]    Gram Stain   Final    FEW WBC PRESENT, PREDOMINANTLY PMN NO ORGANISMS SEEN    Culture   Final    NO GROWTH 3 DAYS Performed at [facility] [address], [place], [place] [address]    Report Status 03/22/2019 FINAL  Final    RADIOLOGY:  No results found.   Management plans discussed with the patient, family and they are in agreement.  CODE STATUS: DNR   TOTAL TIME TAKING CARE OF THIS PATIENT: 45 minutes.    Berna Spare Kaelan Emami M.D on 03/22/2019 at 9:13 AM  Between 7am to 6pm - Pager - [phone]  After 6pm go to [email or web address] - Proofreader  [facility]  Hospitalists  Office  [phone]  CC: Primary care physician; Marie Bis, MD   Note: This dictation was prepared with Dragon dictation along with smaller phrase technology. Any transcriptional errors that result from this process are unintentional.

## 2019-03-23 ENCOUNTER — Other Ambulatory Visit
Admission: RE | Admit: 2019-03-23 | Discharge: 2019-03-23 | Disposition: A | Discharge status: Home / Self Care | Payer: Medicare HMO | Source: Ambulatory Visit | Attending: Nephrology | Admitting: Nephrology

## 2019-03-23 DIAGNOSIS — L02211 Cutaneous abscess of abdominal wall: Secondary | ICD-10-CM | POA: Diagnosis not present

## 2019-03-23 DIAGNOSIS — N186 End stage renal disease: Secondary | ICD-10-CM | POA: Insufficient documentation

## 2019-03-23 DIAGNOSIS — Z992 Dependence on renal dialysis: Secondary | ICD-10-CM | POA: Diagnosis not present

## 2019-03-23 LAB — CULTURE, BLOOD (SINGLE)
Culture: NO GROWTH
Special Requests: ADEQUATE

## 2019-03-23 LAB — HEMOGLOBIN: Hemoglobin: 9 g/dL — ABNORMAL LOW (ref 12.0–15.0)

## 2019-03-24 DIAGNOSIS — E1122 Type 2 diabetes mellitus with diabetic chronic kidney disease: Secondary | ICD-10-CM | POA: Diagnosis not present

## 2019-03-24 DIAGNOSIS — R188 Other ascites: Secondary | ICD-10-CM | POA: Diagnosis not present

## 2019-03-24 DIAGNOSIS — K922 Gastrointestinal hemorrhage, unspecified: Secondary | ICD-10-CM | POA: Diagnosis not present

## 2019-03-24 DIAGNOSIS — K766 Portal hypertension: Secondary | ICD-10-CM | POA: Diagnosis not present

## 2019-03-24 DIAGNOSIS — I8501 Esophageal varices with bleeding: Secondary | ICD-10-CM | POA: Diagnosis not present

## 2019-03-24 DIAGNOSIS — I132 Hypertensive heart and chronic kidney disease with heart failure and with stage 5 chronic kidney disease, or end stage renal disease: Secondary | ICD-10-CM | POA: Diagnosis not present

## 2019-03-24 DIAGNOSIS — K7581 Nonalcoholic steatohepatitis (NASH): Secondary | ICD-10-CM | POA: Diagnosis not present

## 2019-03-24 DIAGNOSIS — I5030 Unspecified diastolic (congestive) heart failure: Secondary | ICD-10-CM | POA: Diagnosis not present

## 2019-03-24 DIAGNOSIS — K7460 Unspecified cirrhosis of liver: Secondary | ICD-10-CM | POA: Diagnosis not present

## 2019-03-24 DIAGNOSIS — T82534D Leakage of infusion catheter, subsequent encounter: Secondary | ICD-10-CM | POA: Diagnosis not present

## 2019-03-26 DIAGNOSIS — L02211 Cutaneous abscess of abdominal wall: Secondary | ICD-10-CM | POA: Diagnosis not present

## 2019-03-26 DIAGNOSIS — Z992 Dependence on renal dialysis: Secondary | ICD-10-CM | POA: Diagnosis not present

## 2019-03-26 DIAGNOSIS — N186 End stage renal disease: Secondary | ICD-10-CM | POA: Diagnosis not present

## 2019-03-27 DIAGNOSIS — N186 End stage renal disease: Secondary | ICD-10-CM | POA: Diagnosis not present

## 2019-03-27 DIAGNOSIS — K766 Portal hypertension: Secondary | ICD-10-CM | POA: Diagnosis not present

## 2019-03-27 DIAGNOSIS — K7460 Unspecified cirrhosis of liver: Secondary | ICD-10-CM | POA: Diagnosis not present

## 2019-03-27 DIAGNOSIS — K7581 Nonalcoholic steatohepatitis (NASH): Secondary | ICD-10-CM | POA: Diagnosis not present

## 2019-03-27 DIAGNOSIS — Z992 Dependence on renal dialysis: Secondary | ICD-10-CM | POA: Diagnosis not present

## 2019-03-27 DIAGNOSIS — R188 Other ascites: Secondary | ICD-10-CM | POA: Diagnosis not present

## 2019-03-27 DIAGNOSIS — I8501 Esophageal varices with bleeding: Secondary | ICD-10-CM | POA: Diagnosis not present

## 2019-03-27 DIAGNOSIS — T82534D Leakage of infusion catheter, subsequent encounter: Secondary | ICD-10-CM | POA: Diagnosis not present

## 2019-03-27 DIAGNOSIS — I5030 Unspecified diastolic (congestive) heart failure: Secondary | ICD-10-CM | POA: Diagnosis not present

## 2019-03-27 DIAGNOSIS — K922 Gastrointestinal hemorrhage, unspecified: Secondary | ICD-10-CM | POA: Diagnosis not present

## 2019-03-27 DIAGNOSIS — E1122 Type 2 diabetes mellitus with diabetic chronic kidney disease: Secondary | ICD-10-CM | POA: Diagnosis not present

## 2019-03-27 DIAGNOSIS — I132 Hypertensive heart and chronic kidney disease with heart failure and with stage 5 chronic kidney disease, or end stage renal disease: Secondary | ICD-10-CM | POA: Diagnosis not present

## 2019-03-28 DIAGNOSIS — K766 Portal hypertension: Secondary | ICD-10-CM | POA: Diagnosis not present

## 2019-03-28 DIAGNOSIS — R188 Other ascites: Secondary | ICD-10-CM | POA: Diagnosis not present

## 2019-03-28 DIAGNOSIS — I5030 Unspecified diastolic (congestive) heart failure: Secondary | ICD-10-CM | POA: Diagnosis not present

## 2019-03-28 DIAGNOSIS — Z681 Body mass index (BMI) 19 or less, adult: Secondary | ICD-10-CM | POA: Diagnosis not present

## 2019-03-28 DIAGNOSIS — I132 Hypertensive heart and chronic kidney disease with heart failure and with stage 5 chronic kidney disease, or end stage renal disease: Secondary | ICD-10-CM | POA: Diagnosis not present

## 2019-03-28 DIAGNOSIS — E1122 Type 2 diabetes mellitus with diabetic chronic kidney disease: Secondary | ICD-10-CM | POA: Diagnosis not present

## 2019-03-28 DIAGNOSIS — N186 End stage renal disease: Secondary | ICD-10-CM | POA: Diagnosis not present

## 2019-03-28 DIAGNOSIS — K7581 Nonalcoholic steatohepatitis (NASH): Secondary | ICD-10-CM | POA: Diagnosis not present

## 2019-03-28 DIAGNOSIS — T82534D Leakage of infusion catheter, subsequent encounter: Secondary | ICD-10-CM | POA: Diagnosis not present

## 2019-03-28 DIAGNOSIS — K7460 Unspecified cirrhosis of liver: Secondary | ICD-10-CM | POA: Diagnosis not present

## 2019-03-28 DIAGNOSIS — I8501 Esophageal varices with bleeding: Secondary | ICD-10-CM | POA: Diagnosis not present

## 2019-03-28 DIAGNOSIS — Z992 Dependence on renal dialysis: Secondary | ICD-10-CM | POA: Diagnosis not present

## 2019-03-28 DIAGNOSIS — N179 Acute kidney failure, unspecified: Secondary | ICD-10-CM | POA: Diagnosis not present

## 2019-03-28 DIAGNOSIS — K922 Gastrointestinal hemorrhage, unspecified: Secondary | ICD-10-CM | POA: Diagnosis not present

## 2019-03-29 ENCOUNTER — Telehealth: Payer: Self-pay | Admitting: Internal Medicine

## 2019-03-29 DIAGNOSIS — K766 Portal hypertension: Secondary | ICD-10-CM | POA: Diagnosis not present

## 2019-03-29 DIAGNOSIS — I5030 Unspecified diastolic (congestive) heart failure: Secondary | ICD-10-CM | POA: Diagnosis not present

## 2019-03-29 DIAGNOSIS — I8501 Esophageal varices with bleeding: Secondary | ICD-10-CM | POA: Diagnosis not present

## 2019-03-29 DIAGNOSIS — E1122 Type 2 diabetes mellitus with diabetic chronic kidney disease: Secondary | ICD-10-CM | POA: Diagnosis not present

## 2019-03-29 DIAGNOSIS — R188 Other ascites: Secondary | ICD-10-CM | POA: Diagnosis not present

## 2019-03-29 DIAGNOSIS — I132 Hypertensive heart and chronic kidney disease with heart failure and with stage 5 chronic kidney disease, or end stage renal disease: Secondary | ICD-10-CM | POA: Diagnosis not present

## 2019-03-29 DIAGNOSIS — K922 Gastrointestinal hemorrhage, unspecified: Secondary | ICD-10-CM | POA: Diagnosis not present

## 2019-03-29 DIAGNOSIS — K7460 Unspecified cirrhosis of liver: Secondary | ICD-10-CM | POA: Diagnosis not present

## 2019-03-29 DIAGNOSIS — T82534D Leakage of infusion catheter, subsequent encounter: Secondary | ICD-10-CM | POA: Diagnosis not present

## 2019-03-29 DIAGNOSIS — K7581 Nonalcoholic steatohepatitis (NASH): Secondary | ICD-10-CM | POA: Diagnosis not present

## 2019-03-29 DIAGNOSIS — N186 End stage renal disease: Secondary | ICD-10-CM | POA: Diagnosis not present

## 2019-03-29 DIAGNOSIS — Z992 Dependence on renal dialysis: Secondary | ICD-10-CM | POA: Diagnosis not present

## 2019-03-29 NOTE — Telephone Encounter (Signed)
Spoke with patient's daughter [name] and have scheduled a Telephone Palliative Consult for 03/30/19 @ 11:30 AM.

## 2019-03-30 ENCOUNTER — Emergency Department: Payer: Medicare HMO

## 2019-03-30 ENCOUNTER — Other Ambulatory Visit: Payer: Medicare HMO | Admitting: Internal Medicine

## 2019-03-30 ENCOUNTER — Other Ambulatory Visit: Payer: Self-pay

## 2019-03-30 ENCOUNTER — Inpatient Hospital Stay
Admission: EM | Admit: 2019-03-30 | Discharge: 2019-04-06 | DRG: 391 | Disposition: A | Discharge status: Home Health | Payer: Medicare HMO | Attending: Internal Medicine | Admitting: Internal Medicine

## 2019-03-30 ENCOUNTER — Other Ambulatory Visit (HOSPITAL_COMMUNITY)
Admission: RE | Admit: 2019-03-30 | Discharge: 2019-03-30 | Disposition: A | Discharge status: Home / Self Care | Payer: Medicare HMO | Source: Ambulatory Visit | Attending: Family Medicine | Admitting: Family Medicine

## 2019-03-30 ENCOUNTER — Encounter: Payer: Self-pay | Admitting: Emergency Medicine

## 2019-03-30 ENCOUNTER — Inpatient Hospital Stay: Payer: Medicare HMO

## 2019-03-30 DIAGNOSIS — T8249XA Other complication of vascular dialysis catheter, initial encounter: Secondary | ICD-10-CM | POA: Diagnosis present

## 2019-03-30 DIAGNOSIS — D631 Anemia in chronic kidney disease: Secondary | ICD-10-CM | POA: Diagnosis not present

## 2019-03-30 DIAGNOSIS — G4700 Insomnia, unspecified: Secondary | ICD-10-CM | POA: Diagnosis present

## 2019-03-30 DIAGNOSIS — Z992 Dependence on renal dialysis: Secondary | ICD-10-CM | POA: Diagnosis not present

## 2019-03-30 DIAGNOSIS — R262 Difficulty in walking, not elsewhere classified: Secondary | ICD-10-CM | POA: Diagnosis not present

## 2019-03-30 DIAGNOSIS — Z789 Other specified health status: Secondary | ICD-10-CM

## 2019-03-30 DIAGNOSIS — I9589 Other hypotension: Secondary | ICD-10-CM | POA: Diagnosis present

## 2019-03-30 DIAGNOSIS — N179 Acute kidney failure, unspecified: Secondary | ICD-10-CM

## 2019-03-30 DIAGNOSIS — Z885 Allergy status to narcotic agent status: Secondary | ICD-10-CM

## 2019-03-30 DIAGNOSIS — K529 Noninfective gastroenteritis and colitis, unspecified: Secondary | ICD-10-CM | POA: Diagnosis not present

## 2019-03-30 DIAGNOSIS — R1084 Generalized abdominal pain: Secondary | ICD-10-CM

## 2019-03-30 DIAGNOSIS — N186 End stage renal disease: Secondary | ICD-10-CM | POA: Diagnosis present

## 2019-03-30 DIAGNOSIS — Z515 Encounter for palliative care: Secondary | ICD-10-CM

## 2019-03-30 DIAGNOSIS — T82534D Leakage of infusion catheter, subsequent encounter: Secondary | ICD-10-CM | POA: Diagnosis not present

## 2019-03-30 DIAGNOSIS — Z66 Do not resuscitate: Secondary | ICD-10-CM | POA: Diagnosis present

## 2019-03-30 DIAGNOSIS — K922 Gastrointestinal hemorrhage, unspecified: Secondary | ICD-10-CM | POA: Diagnosis not present

## 2019-03-30 DIAGNOSIS — N2581 Secondary hyperparathyroidism of renal origin: Secondary | ICD-10-CM | POA: Diagnosis present

## 2019-03-30 DIAGNOSIS — K7581 Nonalcoholic steatohepatitis (NASH): Secondary | ICD-10-CM | POA: Diagnosis not present

## 2019-03-30 DIAGNOSIS — I5022 Chronic systolic (congestive) heart failure: Secondary | ICD-10-CM

## 2019-03-30 DIAGNOSIS — I4581 Long QT syndrome: Secondary | ICD-10-CM | POA: Diagnosis not present

## 2019-03-30 DIAGNOSIS — Y718 Miscellaneous cardiovascular devices associated with adverse incidents, not elsewhere classified: Secondary | ICD-10-CM | POA: Diagnosis not present

## 2019-03-30 DIAGNOSIS — K219 Gastro-esophageal reflux disease without esophagitis: Secondary | ICD-10-CM | POA: Diagnosis present

## 2019-03-30 DIAGNOSIS — Z79899 Other long term (current) drug therapy: Secondary | ICD-10-CM

## 2019-03-30 DIAGNOSIS — I959 Hypotension, unspecified: Secondary | ICD-10-CM | POA: Diagnosis not present

## 2019-03-30 DIAGNOSIS — C649 Malignant neoplasm of unspecified kidney, except renal pelvis: Secondary | ICD-10-CM | POA: Diagnosis present

## 2019-03-30 DIAGNOSIS — E872 Acidosis: Secondary | ICD-10-CM | POA: Diagnosis not present

## 2019-03-30 DIAGNOSIS — I5030 Unspecified diastolic (congestive) heart failure: Secondary | ICD-10-CM | POA: Diagnosis not present

## 2019-03-30 DIAGNOSIS — Z20828 Contact with and (suspected) exposure to other viral communicable diseases: Secondary | ICD-10-CM | POA: Diagnosis not present

## 2019-03-30 DIAGNOSIS — K766 Portal hypertension: Secondary | ICD-10-CM | POA: Diagnosis not present

## 2019-03-30 DIAGNOSIS — I120 Hypertensive chronic kidney disease with stage 5 chronic kidney disease or end stage renal disease: Secondary | ICD-10-CM | POA: Diagnosis not present

## 2019-03-30 DIAGNOSIS — R531 Weakness: Secondary | ICD-10-CM | POA: Diagnosis not present

## 2019-03-30 DIAGNOSIS — K7460 Unspecified cirrhosis of liver: Secondary | ICD-10-CM | POA: Diagnosis present

## 2019-03-30 DIAGNOSIS — Z90711 Acquired absence of uterus with remaining cervical stump: Secondary | ICD-10-CM

## 2019-03-30 DIAGNOSIS — Z03818 Encounter for observation for suspected exposure to other biological agents ruled out: Secondary | ICD-10-CM | POA: Diagnosis not present

## 2019-03-30 DIAGNOSIS — T82898A Other specified complication of vascular prosthetic devices, implants and grafts, initial encounter: Secondary | ICD-10-CM | POA: Diagnosis not present

## 2019-03-30 DIAGNOSIS — D6959 Other secondary thrombocytopenia: Secondary | ICD-10-CM | POA: Diagnosis not present

## 2019-03-30 DIAGNOSIS — R918 Other nonspecific abnormal finding of lung field: Secondary | ICD-10-CM | POA: Diagnosis present

## 2019-03-30 DIAGNOSIS — R197 Diarrhea, unspecified: Secondary | ICD-10-CM | POA: Diagnosis not present

## 2019-03-30 DIAGNOSIS — N2889 Other specified disorders of kidney and ureter: Secondary | ICD-10-CM | POA: Diagnosis not present

## 2019-03-30 DIAGNOSIS — T360X5A Adverse effect of penicillins, initial encounter: Secondary | ICD-10-CM | POA: Diagnosis not present

## 2019-03-30 DIAGNOSIS — E871 Hypo-osmolality and hyponatremia: Secondary | ICD-10-CM | POA: Diagnosis not present

## 2019-03-30 DIAGNOSIS — E1122 Type 2 diabetes mellitus with diabetic chronic kidney disease: Secondary | ICD-10-CM | POA: Diagnosis present

## 2019-03-30 DIAGNOSIS — I129 Hypertensive chronic kidney disease with stage 1 through stage 4 chronic kidney disease, or unspecified chronic kidney disease: Secondary | ICD-10-CM | POA: Diagnosis not present

## 2019-03-30 DIAGNOSIS — R188 Other ascites: Secondary | ICD-10-CM | POA: Diagnosis present

## 2019-03-30 DIAGNOSIS — Z87891 Personal history of nicotine dependence: Secondary | ICD-10-CM

## 2019-03-30 DIAGNOSIS — I8501 Esophageal varices with bleeding: Secondary | ICD-10-CM | POA: Diagnosis not present

## 2019-03-30 DIAGNOSIS — Z8719 Personal history of other diseases of the digestive system: Secondary | ICD-10-CM

## 2019-03-30 DIAGNOSIS — J90 Pleural effusion, not elsewhere classified: Secondary | ICD-10-CM | POA: Diagnosis not present

## 2019-03-30 DIAGNOSIS — J189 Pneumonia, unspecified organism: Secondary | ICD-10-CM | POA: Diagnosis not present

## 2019-03-30 DIAGNOSIS — Z1581 Genetic susceptibility to multiple endocrine neoplasia [MEN]: Secondary | ICD-10-CM | POA: Insufficient documentation

## 2019-03-30 DIAGNOSIS — E782 Mixed hyperlipidemia: Secondary | ICD-10-CM | POA: Diagnosis not present

## 2019-03-30 DIAGNOSIS — D62 Acute posthemorrhagic anemia: Secondary | ICD-10-CM | POA: Diagnosis present

## 2019-03-30 DIAGNOSIS — E876 Hypokalemia: Secondary | ICD-10-CM | POA: Diagnosis not present

## 2019-03-30 DIAGNOSIS — Z87442 Personal history of urinary calculi: Secondary | ICD-10-CM

## 2019-03-30 DIAGNOSIS — Z85528 Personal history of other malignant neoplasm of kidney: Secondary | ICD-10-CM

## 2019-03-30 DIAGNOSIS — R140 Abdominal distension (gaseous): Secondary | ICD-10-CM | POA: Diagnosis not present

## 2019-03-30 DIAGNOSIS — F418 Other specified anxiety disorders: Secondary | ICD-10-CM | POA: Diagnosis present

## 2019-03-30 DIAGNOSIS — Z9049 Acquired absence of other specified parts of digestive tract: Secondary | ICD-10-CM

## 2019-03-30 DIAGNOSIS — R109 Unspecified abdominal pain: Secondary | ICD-10-CM | POA: Diagnosis present

## 2019-03-30 DIAGNOSIS — I10 Essential (primary) hypertension: Secondary | ICD-10-CM | POA: Diagnosis not present

## 2019-03-30 DIAGNOSIS — R5381 Other malaise: Secondary | ICD-10-CM | POA: Diagnosis present

## 2019-03-30 DIAGNOSIS — N839 Noninflammatory disorder of ovary, fallopian tube and broad ligament, unspecified: Secondary | ICD-10-CM | POA: Diagnosis present

## 2019-03-30 DIAGNOSIS — I132 Hypertensive heart and chronic kidney disease with heart failure and with stage 5 chronic kidney disease, or end stage renal disease: Secondary | ICD-10-CM | POA: Diagnosis not present

## 2019-03-30 DIAGNOSIS — R112 Nausea with vomiting, unspecified: Secondary | ICD-10-CM

## 2019-03-30 DIAGNOSIS — N189 Chronic kidney disease, unspecified: Secondary | ICD-10-CM | POA: Diagnosis not present

## 2019-03-30 DIAGNOSIS — I5181 Takotsubo syndrome: Secondary | ICD-10-CM | POA: Diagnosis not present

## 2019-03-30 DIAGNOSIS — Z800 Family history of malignant neoplasm of digestive organs: Secondary | ICD-10-CM

## 2019-03-30 DIAGNOSIS — I480 Paroxysmal atrial fibrillation: Secondary | ICD-10-CM

## 2019-03-30 DIAGNOSIS — E860 Dehydration: Secondary | ICD-10-CM | POA: Diagnosis present

## 2019-03-30 LAB — COMPREHENSIVE METABOLIC PANEL
ALT: 37 U/L (ref 0–44)
ALT: 37 U/L (ref 0–44)
AST: 30 U/L (ref 15–41)
AST: 30 U/L (ref 15–41)
Albumin: 2.3 g/dL — ABNORMAL LOW (ref 3.5–5.0)
Albumin: 2.5 g/dL — ABNORMAL LOW (ref 3.5–5.0)
Alkaline Phosphatase: 137 U/L — ABNORMAL HIGH (ref 38–126)
Alkaline Phosphatase: 160 U/L — ABNORMAL HIGH (ref 38–126)
Anion gap: 18 — ABNORMAL HIGH (ref 5–15)
Anion gap: 21 — ABNORMAL HIGH (ref 5–15)
BUN: 115 mg/dL — ABNORMAL HIGH (ref 8–23)
BUN: 117 mg/dL — ABNORMAL HIGH (ref 8–23)
CO2: 17 mmol/L — ABNORMAL LOW (ref 22–32)
CO2: 16 mmol/L — ABNORMAL LOW (ref 22–32)
Calcium: 6.2 mg/dL — CL (ref 8.9–10.3)
Calcium: 6.2 mg/dL — CL (ref 8.9–10.3)
Chloride: 85 mmol/L — ABNORMAL LOW (ref 98–111)
Chloride: 87 mmol/L — ABNORMAL LOW (ref 98–111)
Creatinine, Ser: 11.15 mg/dL — ABNORMAL HIGH (ref 0.44–1.00)
Creatinine, Ser: 12.1 mg/dL — ABNORMAL HIGH (ref 0.44–1.00)
GFR calc Af Amer: 3 mL/min — ABNORMAL LOW (ref >60)
GFR calc Af Amer: 3 mL/min — ABNORMAL LOW (ref >60)
GFR calc non Af Amer: 3 mL/min — ABNORMAL LOW (ref >60)
GFR calc non Af Amer: 3 mL/min — ABNORMAL LOW (ref >60)
Glucose, Bld: 231 mg/dL — ABNORMAL HIGH (ref 70–99)
Glucose, Bld: 135 mg/dL — ABNORMAL HIGH (ref 70–99)
Potassium: 5.1 mmol/L (ref 3.5–5.1)
Potassium: 4.9 mmol/L (ref 3.5–5.1)
Sodium: 120 mmol/L — ABNORMAL LOW (ref 135–145)
Sodium: 124 mmol/L — ABNORMAL LOW (ref 135–145)
Total Bilirubin: 0.8 mg/dL (ref 0.3–1.2)
Total Bilirubin: 0.8 mg/dL (ref 0.3–1.2)
Total Protein: 5 g/dL — ABNORMAL LOW (ref 6.5–8.1)
Total Protein: 5.1 g/dL — ABNORMAL LOW (ref 6.5–8.1)

## 2019-03-30 LAB — CBC WITH DIFFERENTIAL/PLATELET
Abs Immature Granulocytes: 0.23 10*3/uL — ABNORMAL HIGH (ref 0.00–0.07)
Basophils Absolute: 0 10*3/uL (ref 0.0–0.1)
Basophils Relative: 0 %
Eosinophils Absolute: 0.2 10*3/uL (ref 0.0–0.5)
Eosinophils Relative: 1 %
HCT: 22.7 % — ABNORMAL LOW (ref 36.0–46.0)
Hemoglobin: 7.5 g/dL — ABNORMAL LOW (ref 12.0–15.0)
Immature Granulocytes: 1 %
Lymphocytes Relative: 10 %
Lymphs Abs: 1.6 10*3/uL (ref 0.7–4.0)
MCH: 28.4 pg (ref 26.0–34.0)
MCHC: 33 g/dL (ref 30.0–36.0)
MCV: 86 fL (ref 80.0–100.0)
Monocytes Absolute: 1.9 10*3/uL — ABNORMAL HIGH (ref 0.1–1.0)
Monocytes Relative: 12 %
Neutro Abs: 11.9 10*3/uL — ABNORMAL HIGH (ref 1.7–7.7)
Neutrophils Relative %: 76 %
Platelets: 193 10*3/uL (ref 150–400)
RBC: 2.64 MIL/uL — ABNORMAL LOW (ref 3.87–5.11)
RDW: 15.6 % — ABNORMAL HIGH (ref 11.5–15.5)
WBC: 15.9 10*3/uL — ABNORMAL HIGH (ref 4.0–10.5)
nRBC: 0 % (ref 0.0–0.2)

## 2019-03-30 LAB — SARS CORONAVIRUS 2 BY RT PCR (HOSPITAL ORDER, PERFORMED IN ~~LOC~~ HOSPITAL LAB): SARS Coronavirus 2: NEGATIVE

## 2019-03-30 LAB — CBC
HCT: 22.8 % — ABNORMAL LOW (ref 36.0–46.0)
Hemoglobin: 7.6 g/dL — ABNORMAL LOW (ref 12.0–15.0)
MCH: 28 pg (ref 26.0–34.0)
MCHC: 33.3 g/dL (ref 30.0–36.0)
MCV: 84.1 fL (ref 80.0–100.0)
Platelets: 218 10*3/uL (ref 150–400)
RBC: 2.71 MIL/uL — ABNORMAL LOW (ref 3.87–5.11)
RDW: 15.6 % — ABNORMAL HIGH (ref 11.5–15.5)
WBC: 19.1 10*3/uL — ABNORMAL HIGH (ref 4.0–10.5)
nRBC: 0 % (ref 0.0–0.2)

## 2019-03-30 LAB — C DIFFICILE QUICK SCREEN W PCR REFLEX
C Diff antigen: NEGATIVE
C Diff interpretation: NOT DETECTED
C Diff toxin: NEGATIVE

## 2019-03-30 LAB — PROCALCITONIN: Procalcitonin: 3.57 ng/mL

## 2019-03-30 LAB — LACTIC ACID, PLASMA: Lactic Acid, Venous: 1.6 mmol/L (ref 0.5–1.9)

## 2019-03-30 LAB — LIPASE, BLOOD: Lipase: 97 U/L — ABNORMAL HIGH (ref 11–51)

## 2019-03-30 MED ORDER — SODIUM CHLORIDE 0.9 % IV SOLN
250.0000 mL | INTRAVENOUS | Status: DC | PRN
  Administered 2019-03-31 – 2019-04-03 (×3): 250 mL via INTRAVENOUS

## 2019-03-30 MED ORDER — BUPROPION HCL ER (SR) 100 MG PO TB12
100.0000 mg | ORAL_TABLET | Freq: Every day | ORAL | Status: DC
  Administered 2019-03-31 – 2019-04-06 (×6): 100 mg via ORAL
  Filled 2019-03-30 (×8): qty 1

## 2019-03-30 MED ORDER — FLUOXETINE HCL 20 MG PO CAPS
40.0000 mg | ORAL_CAPSULE | Freq: Every day | ORAL | Status: DC
  Administered 2019-03-31 – 2019-04-06 (×6): 40 mg via ORAL
  Filled 2019-03-30 (×7): qty 2

## 2019-03-30 MED ORDER — PANTOPRAZOLE SODIUM 40 MG PO TBEC
40.0000 mg | DELAYED_RELEASE_TABLET | Freq: Two times a day (BID) | ORAL | Status: DC
  Administered 2019-03-31 – 2019-04-06 (×12): 40 mg via ORAL
  Filled 2019-03-30 (×12): qty 1

## 2019-03-30 MED ORDER — OCUVITE-LUTEIN PO CAPS
1.0000 | ORAL_CAPSULE | Freq: Every day | ORAL | Status: DC
  Administered 2019-03-31 – 2019-04-06 (×6): 1 via ORAL
  Filled 2019-03-30 (×7): qty 1

## 2019-03-30 MED ORDER — HEPARIN SODIUM (PORCINE) 5000 UNIT/ML IJ SOLN
5000.0000 [IU] | Freq: Two times a day (BID) | INTRAMUSCULAR | Status: DC
  Administered 2019-03-31: 5000 [IU] via SUBCUTANEOUS
  Filled 2019-03-30: qty 1

## 2019-03-30 MED ORDER — ONDANSETRON HCL 4 MG/2ML IJ SOLN: 4.0000 mg | Freq: Four times a day (QID) | INTRAMUSCULAR | Status: DC | PRN

## 2019-03-30 MED ORDER — ONDANSETRON HCL 4 MG PO TABS: 4.0000 mg | ORAL_TABLET | Freq: Three times a day (TID) | ORAL | Status: DC | PRN

## 2019-03-30 MED ORDER — SODIUM CHLORIDE 0.9 % IV SOLN
1.0000 g | Freq: Once | INTRAVENOUS | Status: AC
  Administered 2019-03-30: 1 g via INTRAVENOUS
  Filled 2019-03-30: qty 10

## 2019-03-30 MED ORDER — SODIUM CHLORIDE 0.9% FLUSH
3.0000 mL | Freq: Two times a day (BID) | INTRAVENOUS | Status: DC
  Administered 2019-03-31 – 2019-04-04 (×7): 3 mL via INTRAVENOUS

## 2019-03-30 MED ORDER — VITAMIN B-12 1000 MCG PO TABS
500.0000 ug | ORAL_TABLET | Freq: Every day | ORAL | Status: DC
  Administered 2019-03-31 – 2019-04-06 (×6): 500 ug via ORAL
  Filled 2019-03-30 (×6): qty 1

## 2019-03-30 MED ORDER — ONDANSETRON HCL 4 MG PO TABS: 4.0000 mg | ORAL_TABLET | Freq: Four times a day (QID) | ORAL | Status: DC | PRN

## 2019-03-30 MED ORDER — TRAZODONE HCL 100 MG PO TABS: 100.0000 mg | ORAL_TABLET | Freq: Every day | ORAL | Status: DC

## 2019-03-30 MED ORDER — CALCIUM GLUCONATE-NACL 2-0.675 GM/100ML-% IV SOLN
2.0000 g | Freq: Once | INTRAVENOUS | Status: AC
  Administered 2019-03-31: 2000 mg via INTRAVENOUS
  Filled 2019-03-30: qty 100

## 2019-03-30 MED ORDER — ACETAMINOPHEN 325 MG PO TABS
650.0000 mg | ORAL_TABLET | Freq: Four times a day (QID) | ORAL | Status: DC | PRN
  Administered 2019-03-31 – 2019-04-05 (×4): 650 mg via ORAL
  Filled 2019-03-30 (×4): qty 2

## 2019-03-30 MED ORDER — SODIUM CHLORIDE 0.9% FLUSH: 3.0000 mL | INTRAVENOUS | Status: DC | PRN

## 2019-03-30 MED ORDER — ACETAMINOPHEN 650 MG RE SUPP: 650.0000 mg | Freq: Four times a day (QID) | RECTAL | Status: DC | PRN

## 2019-03-30 MED ORDER — MIDODRINE HCL 5 MG PO TABS
10.0000 mg | ORAL_TABLET | Freq: Three times a day (TID) | ORAL | Status: DC
  Administered 2019-03-31 – 2019-04-06 (×19): 10 mg via ORAL
  Filled 2019-03-30 (×21): qty 2

## 2019-03-30 MED ORDER — VANCOMYCIN HCL IN DEXTROSE 1-5 GM/200ML-% IV SOLN
1000.0000 mg | Freq: Once | INTRAVENOUS | Status: AC
  Administered 2019-03-30: 1000 mg via INTRAVENOUS
  Filled 2019-03-30: qty 200

## 2019-03-30 MED ORDER — VITAMIN D 25 MCG (1000 UNIT) PO TABS
2000.0000 [IU] | ORAL_TABLET | Freq: Every day | ORAL | Status: DC
  Administered 2019-03-31 – 2019-04-06 (×6): 2000 [IU] via ORAL
  Filled 2019-03-30 (×6): qty 2

## 2019-03-30 NOTE — Progress Notes (Signed)
Advanced care plan.  Purpose of the Encounter: CODE STATUS  Parties in Attendance: Patient herself and daughter  Patient's Decision Capacity: Intact  Subjective/Patient's story:  [name]  is a 78 y.o. female with a known history of end-stage renal disease currently on peritoneal dialysis, liver cirrhosis, diabetes type 2, GERD, hypertension, hyperlipidemia who is presenting with abdominal pain and diarrhea.  Patient was recently hospitalized and was treated for pneumonia   Objective/Medical story I discussed with the patient and her daughter regarding her desires for cardiac and pulmonary resuscitation   Goals of care determination:  They state that she is DNR from before which we will continue   CODE STATUS: DNR   Time spent discussing advanced care planning: 16 minutes

## 2019-03-30 NOTE — H&P (Signed)
[facility] [place] NAME: [name]    MR#:  [id number]  DATE OF BIRTH:  October 29, 1940  DATE OF ADMISSION:  03/30/2019  PRIMARY CARE PHYSICIAN: Caryl Bis, MD   REQUESTING/REFERRING PHYSICIAN: Merlyn Lot, MD  CHIEF COMPLAINT:   Chief Complaint  Patient presents with  . Abdominal Pain    HISTORY OF PRESENT ILLNESS: [name]  is a 78 y.o. female with a known history of end-stage renal disease currently on peritoneal dialysis, liver cirrhosis, diabetes type 2, GERD, hypertension, hyperlipidemia who is presenting with abdominal pain and diarrhea.  Patient was recently hospitalized and was treated for pneumonia.  According to her daughter she has had diarrhea that has worsened since discharge to home.  He also has been having abdominal pain.  Has not had fevers or chills.  Denies any chest pains or palpitations. Patient is not eating or drinking much.  Her daughter provides [place] most of the review of systems.  Patient is sleepy.      PAST MEDICAL HISTORY:   Past Medical History:  Diagnosis Date  . 10 year risk of MI or stroke 7.5% or greater   . Anemia   . Ascites   . Bursitis   . CKD (chronic kidney disease)   . Diabetes mellitus ([place])   . Esophageal varices ([place])   . ESRD (end stage renal disease) ([place]) 12/2018   to start dialysis  . FHx: migraine headaches   . Gastritis and duodenitis   . GERD (gastroesophageal reflux disease)   . Hiatal hernia   . Hypercholesterolemia   . Hypertension   . Kidney stone   . Liver cirrhosis ([place])   . Melena   . Portal hypertension ([place])   . Renal cell carcinoma ([place])   . Renal lithiasis   . Takotsubo cardiomyopathy 06/2013    PAST SURGICAL HISTORY:  Past Surgical History:  Procedure Laterality Date  . ABDOMINAL HYSTERECTOMY    . BACK SURGERY    . CHOLECYSTECTOMY    . COLONOSCOPY  06/15/2005   RMR: Minimal internal hemorrhoids. Otherwise normal rectum/Normal colon   . COLONOSCOPY    12/14/2001   RMR: Internal hemorrhage, otherwise, normal rectum/ Polyps at 30 and 35 cm resected; the remainder of colonic mucosa appeared normal  . COLONOSCOPY  05/2009   RMR: minimal hemorrhoids, sigmoid hyperplastic polyp  . COLONOSCOPY N/A 10/24/2013   WUJ:[id number] colonic polyps removed as described above/Colonic diverticulosis  . ESOPHAGEAL VARICE LIGATION  12/2018   [facility]  . ESOPHAGOGASTRODUODENOSCOPY  06/12/2007   GNF:AOZHYQ esophagus/patulous EG junction, moderate sized  hiatal hernia, otherwise normal stomach, D1, D2  . ESOPHAGOGASTRODUODENOSCOPY N/A 10/24/2013   RMR: Small hiatal hernia; otherwise, normal EGD  . HIP SURGERY     right  . LEFT HEART CATHETERIZATION WITH CORONARY ANGIOGRAM N/A 07/02/2013   Procedure: LEFT HEART CATHETERIZATION WITH CORONARY ANGIOGRAM;  Surgeon: Josue Hector, MD;  Location: [facility] CATH LAB;  Service: Cardiovascular;  Laterality: N/A;  . PARTIAL HYSTERECTOMY    . TENCKHOFF CATHETER INSERTION      SOCIAL HISTORY:  Social History   Tobacco Use  . Smoking status: Former Smoker    Packs/day: 1.00    Years: 43.00    Pack years: 43.00    Types: Cigarettes    Start date: 08/31/1959    Quit date: 08/30/2002    Years since quitting: 16.5  . Smokeless tobacco: Never Used  . Tobacco comment: Quit x 10 years  Substance  Use Topics  . Alcohol use: No    FAMILY HISTORY:  Family History  Problem Relation Age of Onset  . Pancreatic cancer Father 50  . Colon cancer Mother        late 10s    DRUG ALLERGIES:  Allergies  Allergen Reactions  . Codeine Other (See Comments)    Reaction unknown     REVIEW OF SYSTEMS:   CONSTITUTIONAL: Limited     MEDICATIONS AT HOME:  Prior to Admission medications   Medication Sig Start Date End Date Taking? Authorizing Provider  acetaminophen (TYLENOL) 500 MG tablet Take 500 mg by mouth every 6 (six) hours as needed for mild pain or fever.   Yes [provider]  B Complex-C-Folic  Acid (NEPHRO VITAMINS) 0.8 MG TABS Take 1 tablet by mouth daily. 01/19/19  Yes [provider]  buPROPion (WELLBUTRIN SR) 100 MG 12 hr tablet Take 100 mg by mouth daily.    Yes [provider]  cholecalciferol (VITAMIN D) 1000 UNITS tablet Take 2,000 Units by mouth daily.   Yes [provider]  doxycycline (VIBRA-TABS) 100 MG tablet Take 1 tablet (100 mg total) by mouth 2 (two) times daily. 03/22/19  Yes Mayo, Pete Pelt, MD  FLUoxetine (PROZAC) 40 MG capsule Take 40 mg by mouth daily.   Yes [provider]  lactulose (CHRONULAC) 10 GM/15ML solution Take 10 g by mouth 2 (two) times daily.   Yes [provider]  midodrine (PROAMATINE) 10 MG tablet Take 10 mg by mouth 3 (three) times daily after meals. 02/26/19  Yes [provider]  Multiple Vitamins-Minerals (PRESERVISION AREDS) TABS Take 1 tablet by mouth daily.    Yes [provider]  ondansetron (ZOFRAN) 4 MG tablet Take 4 mg by mouth every 6 (six) hours as needed. 03/29/19  Yes [provider]  pantoprazole (PROTONIX) 40 MG tablet Take 40 mg by mouth 2 (two) times daily. 02/26/19  Yes [provider]  torsemide (DEMADEX) 20 MG tablet Take 40 mg by mouth daily.  02/28/19  Yes [provider]  traZODone (DESYREL) 50 MG tablet Take 100 mg by mouth at bedtime.    Yes [provider]  vitamin B-12 (CYANOCOBALAMIN) 500 MCG tablet Take 500 mcg by mouth daily.   Yes [provider]      PHYSICAL EXAMINATION:   VITAL SIGNS: Blood pressure (!) 94/43, pulse 86, temperature 98.7 F (37.1 C), temperature source Oral, resp. rate 16, height 5\' 2"  (1.575 m), weight 42.2 kg, SpO2 97 %.  GENERAL:  78 y.o.-year-old patient lying in the bed with no acute distress.  EYES: Pupils equal, round, reactive to light and accommodation. No scleral icterus. Extraocular muscles intact.  HEENT: Head atraumatic, normocephalic. Oropharynx and nasopharynx clear.  NECK:   Supple, no jugular venous distention. No thyroid enlargement, no tenderness.  LUNGS: Normal breath sounds bilaterally, no wheezing, rales,rhonchi or crepitation. No use of accessory muscles of respiration.  CARDIOVASCULAR: S1, S2 normal. No murmurs, rubs, or gallops.  ABDOMEN: Soft, lower abdominal tenderness PD catheter in place, nondistended. Bowel sounds present. No organomegaly or mass.  EXTREMITIES: No pedal edema, cyanosis, or clubbing.  NEUROLOGIC: Cranial nerves II through XII are intact. Muscle strength 5/5 in all extremities. Sensation intact. Gait not checked.  PSYCHIATRIC: The patient is currently sleepy  sKIN: No obvious rash, lesion, or ulcer.   LABORATORY PANEL:   CBC Recent Labs  Lab 03/30/19 1221 03/30/19 1813  WBC 15.9* 19.1*  HGB 7.5* 7.6*  HCT 22.7* 22.8*  PLT 193 218  MCV 86.0 84.1  MCH 28.4 28.0  MCHC 33.0 33.3  RDW 15.6* 15.6*  LYMPHSABS 1.6  --   MONOABS 1.9*  --   EOSABS 0.2  --   BASOSABS 0.0  --    ------------------------------------------------------------------------------------------------------------------  Chemistries  Recent Labs  Lab 03/30/19 1221 03/30/19 1813  NA 120* 124*  K 5.1 4.9  CL 85* 87*  CO2 17* 16*  GLUCOSE 231* 135*  BUN 115* 117*  CREATININE 11.15* 12.10*  CALCIUM 6.2* 6.2*  AST 30 30  ALT 37 37  ALKPHOS 137* 160*  BILITOT 0.8 0.8   ------------------------------------------------------------------------------------------------------------------ estimated creatinine clearance is 2.5 mL/min (A) (by C-G formula based on SCr of 12.1 mg/dL (H)). ------------------------------------------------------------------------------------------------------------------ No results for input(s): TSH, T4TOTAL, T3FREE, THYROIDAB in the last 72 hours.  Invalid input(s): FREET3   Coagulation profile No results for input(s): INR, PROTIME in the last 168  hours. ------------------------------------------------------------------------------------------------------------------- No results for input(s): DDIMER in the last 72 hours. -------------------------------------------------------------------------------------------------------------------  Cardiac Enzymes No results for input(s): CKMB, TROPONINI, MYOGLOBIN in the last 168 hours.  Invalid input(s): CK ------------------------------------------------------------------------------------------------------------------ Invalid input(s): POCBNP  ---------------------------------------------------------------------------------------------------------------  Urinalysis    Component Value Date/Time   COLORURINE YELLOW 01/21/2018 [place] 01/21/2018 1433   LABSPEC 1.009 01/21/2018 1433   PHURINE 6.0 01/21/2018 1433   GLUCOSEU 50 (A) 01/21/2018 1433   HGBUR NEGATIVE 01/21/2018 1433   BILIRUBINUR NEGATIVE 01/21/2018 1433   KETONESUR 5 (A) 01/21/2018 1433   PROTEINUR NEGATIVE 01/21/2018 1433   UROBILINOGEN 0.2 02/09/2015 1300   NITRITE NEGATIVE 01/21/2018 1433   LEUKOCYTESUR TRACE (A) 01/21/2018 1433     RADIOLOGY: No results found.  EKG: Orders placed or performed during the hospital encounter of 03/30/19  . ED EKG  . ED EKG    IMPRESSION AND PLAN: Patient 78 year old white female who is presenting with abdominal pain and diarrhea  1.  Abdominal pain and diarrhea stool for C. difficile has been ruled out I will obtain a CT scan of the abdomen. If CT of the abdomen is negative need to consider removing the PD catheter We will treat with IV antibiotics  2.  End-stage renal disease nephrology has been consulted  3.  Hyponatremia due to dehydration we will give her IV fluids  4.  Hypotension continue midodrine   5.  Diabetes type 2 we will place on sliding scale insulin currently not on any medications at home  6.  Depression anxiety continue Wellbutrin  and trazodone  7.  Chronic anemia follow CBC  8.  Miscellaneous heparin for DVT prophylaxis   All the records are reviewed and case discussed with ED provider. Management plans discussed with the patient, family and they are in agreement.  CODE STATUS: Code Status History    Date Active Date Inactive Code Status Order ID Comments User Context   03/17/2019 2111 03/22/2019 1659 DNR [id number]  Loletha Grayer, MD ED   07/02/2013 0141 07/06/2013 1655 Full Code [id number]  Stephani Police, MD Inpatient   Advance Care Planning Activity    Questions for Most Recent Historical Code Status (Order [id number])    Question Answer Comment   In the event of cardiac or respiratory ARREST Do not call a "code blue"    In the event of cardiac or respiratory ARREST Do not perform Intubation, CPR, defibrillation or ACLS    In the event of cardiac or respiratory ARREST Use medication by any route, position, wound care, and other  measures to relive pain and suffering. May use oxygen, suction and manual treatment of airway obstruction as needed for comfort.    Comments nurse may pronounce        TOTAL TIME TAKING CARE OF THIS PATIENT: 55 minutes.    Dustin Flock M.D on 03/30/2019 at 7:53 PM  Between 7am to 6pm - Pager - [phone]  After 6pm go to [email or web address] - password [email or web address] [facility]  [phone]  CC: Primary care physician; Caryl Bis, MD

## 2019-03-30 NOTE — ED Notes (Signed)
ED TO INPATIENT HANDOFF REPORT  ED Nurse Name and Phone #: Seth Bake #[phone]  S Name/Age/Gender [name] 78 y.o. female Room/Bed: ED14A/ED14A  Code Status   Code Status: DNR  Home/SNF/Other Home Patient oriented to: self, place, time and situation Is this baseline? Yes   Triage Complete: Triage complete  Chief Complaint Diarrhea; Emesis  Triage Note Pt c/o lower abdominal pain.  Had PD catheter placed in July.  Pt has just started transitioning to PD.  Still has permcath as well.  PD fluid is cloudy per daughter normally because of ascites.  Pt has had severe diarrhea for couple days. No fevers.  Pt also having tailbone pain from fall yesterday.  Generalized weakness noted.    Pt does not appear to feel well.  Daughter reports liquid diarrhea.  Family notified to let RN know if going to have bowel movement.  Has had generalized weakness and shaking.  Has had lot of leg cramping.  Last heme dialysis was when in hospital and had PD monday, Tuesday and Wednesday this week.    Allergies Allergies  Allergen Reactions  . Codeine Other (See Comments)    Reaction unknown     Level of Care/Admitting Diagnosis ED Disposition    ED Disposition Condition [facility] Area: [place] [100120]  Level of Care: Med-Surg [16]  Covid Evaluation: Asymptomatic Screening Protocol (No Symptoms)  Diagnosis: Abdominal pain [759163]  Admitting Physician: Dustin Flock [[id number]]  Attending Physician: Dustin Flock [[id number]]  Estimated length of stay: past midnight tomorrow  Certification:: I certify this patient will need inpatient services for at least 2 midnights  PT Class (Do Not Modify): Inpatient [101]  PT Acc Code (Do Not Modify): Private [1]       B Medical/Surgery History Past Medical History:  Diagnosis Date  . 10 year risk of MI or stroke 7.5% or greater   . Anemia   . Ascites   . Bursitis   . CKD (chronic kidney disease)   .  Diabetes mellitus ([place])   . Esophageal varices ([place])   . ESRD (end stage renal disease) ([place]) 12/2018   to start dialysis  . FHx: migraine headaches   . Gastritis and duodenitis   . GERD (gastroesophageal reflux disease)   . Hiatal hernia   . Hypercholesterolemia   . Hypertension   . Kidney stone   . Liver cirrhosis ([place])   . Melena   . Portal hypertension ([place])   . Renal cell carcinoma ([place])   . Renal lithiasis   . Takotsubo cardiomyopathy 06/2013   Past Surgical History:  Procedure Laterality Date  . ABDOMINAL HYSTERECTOMY    . BACK SURGERY    . CHOLECYSTECTOMY    . COLONOSCOPY  06/15/2005   RMR: Minimal internal hemorrhoids. Otherwise normal rectum/Normal colon  . COLONOSCOPY    12/14/2001   RMR: Internal hemorrhage, otherwise, normal rectum/ Polyps at 30 and 35 cm resected; the remainder of colonic mucosa appeared normal  . COLONOSCOPY  05/2009   RMR: minimal hemorrhoids, sigmoid hyperplastic polyp  . COLONOSCOPY N/A 10/24/2013   XBL:[id number] colonic polyps removed as described above/Colonic diverticulosis  . ESOPHAGEAL VARICE LIGATION  12/2018   [facility]  . ESOPHAGOGASTRODUODENOSCOPY  06/12/2007   ZRA:[id number] esophagus/patulous EG junction, moderate sized  hiatal hernia, otherwise normal stomach, D1, D2  . ESOPHAGOGASTRODUODENOSCOPY N/A 10/24/2013   RMR: Small hiatal hernia; otherwise, normal EGD  . HIP SURGERY     right  . LEFT HEART  CATHETERIZATION WITH CORONARY ANGIOGRAM N/A 07/02/2013   Procedure: LEFT HEART CATHETERIZATION WITH CORONARY ANGIOGRAM;  Surgeon: Josue Hector, MD;  Location: [facility] CATH LAB;  Service: Cardiovascular;  Laterality: N/A;  . PARTIAL HYSTERECTOMY    . [place]       A IV Location/Drains/Wounds Patient Lines/Drains/Airways Status   Active Line/Drains/Airways    Name:   Placement date:   Placement time:   Site:   Days:   Peripheral IV 03/30/19 Left Antecubital   03/30/19    1955    Antecubital   less than 1   Hemodialysis  Catheter Right Subclavian   -    -    Subclavian             Intake/Output Last 24 hours No intake or output data in the 24 hours ending 03/30/19 2225  Labs/Imaging Results for orders placed or performed during the hospital encounter of 03/30/19 (from the past 48 hour(s))  Lipase, blood     Status: Abnormal   Collection Time: 03/30/19  6:13 PM  Result Value Ref Range   Lipase 97 (H) 11 - 51 U/L    Comment: Performed at [facility], [address] [place], [place] [address]  Comprehensive metabolic panel     Status: Abnormal   Collection Time: 03/30/19  6:13 PM  Result Value Ref Range   Sodium 124 (L) 135 - 145 mmol/L    Comment: LYTES REPEATED  MLK   Potassium 4.9 3.5 - 5.1 mmol/L   Chloride 87 (L) 98 - 111 mmol/L   CO2 16 (L) 22 - 32 mmol/L   Glucose, Bld 135 (H) 70 - 99 mg/dL   BUN 117 (H) 8 - 23 mg/dL   Creatinine, Ser 12.10 (H) 0.44 - 1.00 mg/dL   Calcium 6.2 (LL) 8.9 - 10.3 mg/dL    Comment: CRITICAL RESULT CALLED TO, READ BACK BY AND VERIFIED WITH ANGELA ROBBINS 03/30/19 @ 1851  MLK    Total Protein 5.1 (L) 6.5 - 8.1 g/dL   Albumin 2.5 (L) 3.5 - 5.0 g/dL   AST 30 15 - 41 U/L   ALT 37 0 - 44 U/L   Alkaline Phosphatase 160 (H) 38 - 126 U/L   Total Bilirubin 0.8 0.3 - 1.2 mg/dL   GFR calc non Af Amer 3 (L) >60 mL/min   GFR calc Af Amer 3 (L) >60 mL/min   Anion gap 21 (H) 5 - 15    Comment: Performed at [facility], [address] [place], [place] [address]  CBC     Status: Abnormal   Collection Time: 03/30/19  6:13 PM  Result Value Ref Range   WBC 19.1 (H) 4.0 - 10.5 K/uL   RBC 2.71 (L) 3.87 - 5.11 MIL/uL   Hemoglobin 7.6 (L) 12.0 - 15.0 g/dL   HCT 22.8 (L) 36.0 - 46.0 %   MCV 84.1 80.0 - 100.0 fL   MCH 28.0 26.0 - 34.0 pg   MCHC 33.3 30.0 - 36.0 g/dL   RDW 15.6 (H) 11.5 - 15.5 %   Platelets 218 150 - 400 K/uL   nRBC 0.0 0.0 - 0.2 %    Comment: Performed at [facility], [place], [place] [address]  Lactic acid,  plasma     Status: None   Collection Time: 03/30/19  6:13 PM  Result Value Ref Range   Lactic Acid, Venous 1.6 0.5 - 1.9 mmol/L    Comment: Performed at  [place], [place] [address]  Procalcitonin     Status: None   Collection Time: 03/30/19  6:13 PM  Result Value Ref Range   Procalcitonin 3.57 ng/mL    Comment:        Interpretation: PCT > 2 ng/mL: Systemic infection (sepsis) is likely, unless other causes are known. (NOTE)       Sepsis PCT Algorithm           Lower Respiratory Tract                                      Infection PCT Algorithm    ----------------------------     ----------------------------         PCT < 0.25 ng/mL                PCT < 0.10 ng/mL         Strongly encourage             Strongly discourage   discontinuation of antibiotics    initiation of antibiotics    ----------------------------     -----------------------------       PCT 0.25 - 0.50 ng/mL            PCT 0.10 - 0.25 ng/mL               OR       >80% decrease in PCT            Discourage initiation of                                            antibiotics      Encourage discontinuation           of antibiotics    ----------------------------     -----------------------------         PCT >= 0.50 ng/mL              PCT 0.26 - 0.50 ng/mL               AND       <80% decrease in PCT              Encourage initiation of                                             antibiotics       Encourage continuation           of antibiotics    ----------------------------     -----------------------------        PCT >= 0.50 ng/mL                  PCT > 0.50 ng/mL               AND         increase in PCT                  Strongly encourage  initiation of antibiotics    Strongly encourage escalation           of antibiotics                                     -----------------------------                                           PCT  <= 0.25 ng/mL                                                 OR                                        > 80% decrease in PCT                                     Discontinue / Do not initiate                                             antibiotics Performed at [facility], [address] [place], [place] [address]   SARS Coronavirus 2 [facility] order, Performed in [place] hospital lab)     Status: None   Collection Time: 03/30/19  7:42 PM  Result Value Ref Range   SARS Coronavirus 2 NEGATIVE NEGATIVE    Comment: (NOTE) If result is NEGATIVE SARS-CoV-2 target nucleic acids are NOT DETECTED. The SARS-CoV-2 RNA is generally detectable in upper and lower  respiratory specimens during the acute phase of infection. The lowest  concentration of SARS-CoV-2 viral copies this assay can detect is 250  copies / mL. A negative result does not preclude SARS-CoV-2 infection  and should not be used as the sole basis for treatment or other  patient management decisions.  A negative result may occur with  improper specimen collection / handling, submission of specimen other  than nasopharyngeal swab, presence of viral mutation(s) within the  areas targeted by this assay, and inadequate number of viral copies  (<250 copies / mL). A negative result must be combined with clinical  observations, patient history, and epidemiological information. If result is POSITIVE SARS-CoV-2 target nucleic acids are DETECTED. The SARS-CoV-2 RNA is generally detectable in upper and lower  respiratory specimens dur ing the acute phase of infection.  Positive  results are indicative of active infection with SARS-CoV-2.  Clinical  correlation with patient history and other diagnostic information is  necessary to determine patient infection status.  Positive results do  not rule out bacterial infection or co-infection with other viruses. If result is PRESUMPTIVE POSTIVE SARS-CoV-2 nucleic acids MAY BE  PRESENT.   A presumptive positive result was obtained on the submitted specimen  and confirmed on repeat testing.  While 2019 novel coronavirus  (SARS-CoV-2) nucleic acids may be present in the submitted sample  additional confirmatory testing may be  necessary for epidemiological  and / or clinical management purposes  to differentiate between  SARS-CoV-2 and other Sarbecovirus currently known to infect humans.  If clinically indicated additional testing with an alternate test  methodology [phone]) is advised. The SARS-CoV-2 RNA is generally  detectable in upper and lower respiratory sp ecimens during the acute  phase of infection. The expected result is Negative. Fact Sheet for Patients:  StrictlyIdeas.no Fact Sheet for Healthcare Providers: BankingDealers.co.za This test is not yet approved or cleared by the [place] FDA and has been authorized for detection and/or diagnosis of SARS-CoV-2 by FDA under an Emergency Use Authorization (EUA).  This EUA will remain in effect (meaning this test can be used) for the duration of the COVID-19 declaration under Section 564(b)(1) of the Act, 21 U.S.C. section 360bbb-3(b)(1), unless the authorization is terminated or revoked sooner. Performed at [facility], [address] [place], [place] [address]    Dg Chest 1 View  Result Date: 03/30/2019 CLINICAL DATA:  Pneumonia EXAM: CHEST  1 VIEW COMPARISON:  March 18, 2019 FINDINGS: The tunneled dialysis catheter is well position. The right suprahilar pulmonary nodules better visualized on prior CT. There is no pneumothorax. No large pleural effusion. The heart size is stable from prior study. Aortic calcifications are noted. IMPRESSION: 1. No acute cardiopulmonary process. 2. Known right suprahilar pulmonary nodule is better visualized on prior CT. Electronically Signed   By: Constance Holster M.D.   On: 03/30/2019 20:16    Pending  Labs Unresulted Labs (From admission, onward)    Start     Ordered   03/30/19 2201  Gastrointestinal Panel by PCR , Stool  (Gastrointestinal Panel by PCR, Stool)  Once,   STAT     03/30/19 2200   03/30/19 2037  Body fluid culture  ONCE - STAT,   STAT    Question:  Patient immune status  Answer:  Immunocompromised   03/30/19 2036   03/30/19 2036  Body fluid cell count with differential  Add-on,   AD    Question:  Are there also cytology or pathology orders on this specimen?  Answer:  No   03/30/19 2036   03/30/19 1931  Blood Culture (routine x 2)  BLOOD CULTURE X 2,   STAT     03/30/19 1930   03/30/19 1815  Lactic acid, plasma  Now then every 2 hours,   STAT     03/30/19 1814   Signed and Held  CBC  (heparin)  Once,   R    Comments: Baseline for heparin therapy IF NOT ALREADY DRAWN.  Notify MD if PLT < 100 K.    Signed and Held   Signed and Held  Creatinine, serum  (heparin)  Once,   R    Comments: Baseline for heparin therapy IF NOT ALREADY DRAWN.    Signed and Held   Signed and Held  CBC  Tomorrow morning,   R     Signed and Held   Signed and Held  Basic metabolic panel  Tomorrow morning,   R     Signed and Held          Vitals/Pain Today's Vitals   03/30/19 1854 03/30/19 1930 03/30/19 2030 03/30/19 2130  BP: (!) 94/43 (!) 94/55 (!) 97/55 (!) 102/56  Pulse: 86 82 86 79  Resp: 16 10 12  (!) 6  Temp: 98.7 F (37.1 C)     TempSrc: Oral     SpO2: 97% 98% 98% 97%  Weight:      Height:      PainSc:   Asleep Asleep    Isolation Precautions Enteric precautions (UV disinfection)  Medications Medications  vancomycin (VANCOCIN) IVPB 1000 mg/200 mL premix (1,000 mg Intravenous New Bag/Given 03/30/19 2138)  cefTRIAXone (ROCEPHIN) 1 g in sodium chloride 0.9 % 100 mL IVPB (0 g Intravenous Stopped 03/30/19 2123)    Mobility walks with person assist Moderate fall risk   Focused Assessments Cardiac Assessment Handoff:    Lab Results  Component Value Date   CKTOTAL 64  01/21/2018   TROPONINI <0.03 01/21/2018   No results found for: DDIMER Does the Patient currently have chest pain? No     R Recommendations: See Admitting Provider Note  Report given to:   Additional Notes: n/a

## 2019-03-30 NOTE — Consult Note (Signed)
PHARMACY -  BRIEF ANTIBIOTIC NOTE   Pharmacy has received consult(s) for Vancomcyin from an ED provider.  The patient's profile has been reviewed for ht/wt/allergies/indication/available labs.    One time order(s) placed for Vancomycin 1g IV.  Further antibiotics/pharmacy consults should be ordered by admitting physician if indicated.                       Thank you, Pearla Dubonnet 03/30/2019  7:40 PM

## 2019-03-30 NOTE — Progress Notes (Addendum)
July 31st, 2020 [facility] Palliative Care Consult Note Telephone: [phone]  Fax: [phone]  *Due to the current COVID-19 infection/crises, the family prefer, and have given their verbal consent for, a provider visit via telemedicine. HIPPA policies of confidentially were discussed. Video-audio (telehealth) contact was unable to be done due technical barriers from the patients side.  PATIENT NAME: [name] DOB: 11/23/1940 MRN: [id number] Home: [address] [place], [place] Phone: Patient's daughter [name] Joyce/[phone]  PRIMARY CARE PROVIDER:   Caryl Bis, MD  REFERRING PROVIDER:  Caryl Bis, MD [place],  [address] [address]  RESPONSIBLE PARTY: (daughter) [name]  ASSESSMENT / RECOMMENDATIONS:   1.Cognitive / Functional decline r/t ESRD (started peritoneal dialysis) and Liver Cirrhosis: Daughter [name] reports patient has been hospitalized 5 times over the last 4 months. Patient has a spot on her lung discovered during evaluation for pneumonia suspicious for carcinoma; no further w/u or evaluation planned d/t patients debilitated condition. Patients weight is 99lbs. At a height of 52 her BMI is 18.1 kg/m2. She c/o emesis and indigestion. Patient is becoming increasingly weak thought r/t dehydration from emesis and diarrhea. Her lactulose was decreased. She needs stand by assist to ambulate and is a 2-person transfer. Patient has a fall last night, and now has a knot on her tailbone. [name] reports patients stool is jelly like consistency and dark green. [facility] has just drawn labs and gathered a stool sample (? Testing for C-diff). Overall patient feels horrible.   2. Family Supports: PCG/HCPOA daughter [name] feels overwhelmed with care of her mom. She sees her stress manifest by becoming increasingly forgetful. Patient funds are limited. Patient currently lives alone in her apartment. [name] lives 10  min away. Three sisters live in [place]. Since March Brenda has been coming over M-F, leaving her husband at home. Timoteo Gaul son is grown.  [name] mentions that it would be difficult for patient to move into [facility] home as space is limited. [name] has been trained to initiate and discontinue patients peritoneal dialysis (8hr run time overnight), which was just initiated this past week. Brendas 2 sisters cover overnight but if patient unstable Brenda stays the night as well.  [name] works remotely via [employer] for [employer]. Patients sister covers Sat eve to Sunday. A neighbor will come over when asked, so that [name] can run some quick errands. [name] has started the application with DSS for Medicaid. [place] ([place]) following with SN, bath aide twice a week. Patient has a church family but limited contacts d/t COVID. Patient has always been very independent in her life; finds it difficult to ask for/receive help  -Family meeting scheduled for Aug 6 with patients daughters to clarify goas of care, discuss feasibility of continued support of patient within her apartment, and to answer questions regarding patients medical condition. Will discuss hospice service. Discuss likelihood of recurrent hospitalizations   -I will reach out to [place]; can we expedite Medicaid application in view of patients poor prognosis?  3. Advanced Care Directive: DNR/MOST on chart. She doesnt have copies at home; I tucked 2 DNR forms in the mail to her. Will complete copy of MOST form to keep in the home, when I visit.  4. Goals of Care: Comfort. To feel better. Keep patient in her apartment with 24/7 care. [place] trip in August if possible.  5. Follow up Palliative Care Visit: Home visit Thursday Aug 6th at  6 pm family meeting.  I spent 60 minutes providing this consultation from 11:30 am to 12:30 pm. More than 50% of the time in this consultation was spent coordinating communication.    HISTORY OF PRESENT ILLNESS:  [name] is a 78 y.o. female with/o ESRD (peritoneal dialysis), anemia of chronic disease (not on epogen d/t lung mass. Txs 2 [place] PRBCs July 2020 hosp adm), liver cirrhosis (lactulose), lung nodule, DM, esophageal varices, ascites, migraine headaches, gastritis/duodenitis, GERD, hiatal hernia, hypercholesterolemia, HTN, portal HTN, renal cell carcinoma, Takotsubo cardiomyopathy. Hospital admission 7/18-7/23/2020 for hyperkalemia; ongoing abdominal wound (MRSA; intraperitoneal vancomycin and oral Bactrim; discharged on 10 day course doxycycline. New RUL 1.8 cmlung nodule by CT scan (pulm recommended f/u OP bronchoscopy; OP pulm f/u). Needs GI f/u as OP. Palliative Care was asked to help address goals of care. Mail 2 DNR forms to home  CODE STATUS: DNR and Most form on [facility] EMR/VYNCA. Details of MOST: DNR/DNI. Limited scope of medical interventions. Yes to antibiotics and IVFs. No to Feeding tube  PPS: 30%  HOSPICE ELIGIBILITY/DIAGNOSIS: Yes/ESRD, liver cirrhosis   PAST MEDICAL HISTORY:  Past Medical History:  Diagnosis Date   10 year risk of MI or stroke 7.5% or greater    Anemia    Ascites    Bursitis    CKD (chronic kidney disease)    Diabetes mellitus (HCC)    Esophageal varices (HCC)    ESRD (end stage renal disease) ([place]) 12/2018   to start dialysis   FHx: migraine headaches    Gastritis and duodenitis    GERD (gastroesophageal reflux disease)    Hiatal hernia    Hypercholesterolemia    Hypertension    Kidney stone    Liver cirrhosis ([place])    Melena    Portal hypertension (HCC)    Renal cell carcinoma ([place])    Renal lithiasis    Takotsubo cardiomyopathy 06/2013    SOCIAL HX:  Social History   Tobacco Use   Smoking status: Former Smoker    Packs/day: 1.00    Years: 43.00    Pack years: 43.00    Types: Cigarettes    Start date: 08/31/1959    Quit date: 08/30/2002    Years since quitting: 16.5   Smokeless  tobacco: Never Used   Tobacco comment: Quit x 10 years  Substance Use Topics   Alcohol use: No    ALLERGIES:  Allergies  Allergen Reactions   Codeine Other (See Comments)    Reaction unknown      PERTINENT MEDICATIONS:  Outpatient Encounter Medications as of 03/30/2019  Medication Sig   acetaminophen (TYLENOL) 500 MG tablet Take 500 mg by mouth every 6 (six) hours as needed for mild pain or fever.   B Complex-C-Folic Acid (NEPHRO VITAMINS) 0.8 MG TABS Take 1 tablet by mouth daily.   buPROPion (WELLBUTRIN SR) 100 MG 12 hr tablet Take 100 mg by mouth daily.    cholecalciferol (VITAMIN D) 1000 UNITS tablet Take 2,000 Units by mouth daily.   doxycycline (VIBRA-TABS) 100 MG tablet Take 1 tablet (100 mg total) by mouth 2 (two) times daily.   FLUoxetine (PROZAC) 40 MG capsule Take 40 mg by mouth daily.   lactulose (CHRONULAC) 10 GM/15ML solution Take 10 g by mouth 2 (two) times daily.   magnesium oxide (MAG-OX) 400 MG tablet Take 400 mg by mouth 2 (two) times daily.   midodrine (PROAMATINE) 10 MG tablet Take 10 mg by mouth 3 (three) times daily after meals.  Multiple Vitamins-Minerals (PRESERVISION AREDS) TABS Take 1 tablet by mouth daily.    nystatin (MYCOSTATIN) 100000 UNIT/ML suspension Take 5 mLs by mouth 3 (three) times daily.   pantoprazole (PROTONIX) 40 MG tablet Take 40 mg by mouth 2 (two) times daily.   torsemide (DEMADEX) 20 MG tablet Take 40 mg by mouth daily.    traZODone (DESYREL) 50 MG tablet Take 100 mg by mouth at bedtime.    vitamin B-12 (CYANOCOBALAMIN) 500 MCG tablet Take 500 mcg by mouth daily.   No facility-administered encounter medications on file as of 03/30/2019.     PHYSICAL EXAM:   PE deferred d/t audio only nature of telehealth visit.  Julianne Handler, NP

## 2019-03-30 NOTE — ED Notes (Signed)
Pt cleaned and changed by this tech and Alyssa EDT. Pt repositioned in bed. Warm blanket given.

## 2019-03-30 NOTE — ED Provider Notes (Signed)
[facility] Emergency Department Provider Note    None    (approximate)  I have reviewed the triage vital signs and the nursing notes.   HISTORY  Chief Complaint Abdominal Pain    HPI [name] is a 78 y.o. female extensive past medical history presents the ER for evaluation of abdominal pain and difficulty with PD at home.  Feeling weak is having some nausea.  No measured fevers.  Is having worsening tenderness and pain around PD site.  Was brought to the ER due to concern for SBP.    Past Medical History:  Diagnosis Date  . 10 year risk of MI or stroke 7.5% or greater   . Anemia   . Ascites   . Bursitis   . CKD (chronic kidney disease)   . Diabetes mellitus ([place])   . Esophageal varices ([place])   . ESRD (end stage renal disease) ([place]) 12/2018   to start dialysis  . FHx: migraine headaches   . Gastritis and duodenitis   . GERD (gastroesophageal reflux disease)   . Hiatal hernia   . Hypercholesterolemia   . Hypertension   . Kidney stone   . Liver cirrhosis ([place])   . Melena   . Portal hypertension ([place])   . Renal cell carcinoma ([place])   . Renal lithiasis   . Takotsubo cardiomyopathy 06/2013   Family History  Problem Relation Age of Onset  . Pancreatic cancer Father 40  . Colon cancer Mother        late 35s   Past Surgical History:  Procedure Laterality Date  . ABDOMINAL HYSTERECTOMY    . BACK SURGERY    . CHOLECYSTECTOMY    . COLONOSCOPY  06/15/2005   RMR: Minimal internal hemorrhoids. Otherwise normal rectum/Normal colon  . COLONOSCOPY    12/14/2001   RMR: Internal hemorrhage, otherwise, normal rectum/ Polyps at 30 and 35 cm resected; the remainder of colonic mucosa appeared normal  . COLONOSCOPY  05/2009   RMR: minimal hemorrhoids, sigmoid hyperplastic polyp  . COLONOSCOPY N/A 10/24/2013   ZCH:YIFOYDXA colonic polyps removed as described above/Colonic diverticulosis  . ESOPHAGEAL VARICE LIGATION  12/2018   [facility]  .  ESOPHAGOGASTRODUODENOSCOPY  06/12/2007   JOI:NOMVEH esophagus/patulous EG junction, moderate sized  hiatal hernia, otherwise normal stomach, D1, D2  . ESOPHAGOGASTRODUODENOSCOPY N/A 10/24/2013   RMR: Small hiatal hernia; otherwise, normal EGD  . HIP SURGERY     right  . LEFT HEART CATHETERIZATION WITH CORONARY ANGIOGRAM N/A 07/02/2013   Procedure: LEFT HEART CATHETERIZATION WITH CORONARY ANGIOGRAM;  Surgeon: Josue Hector, MD;  Location[facility] CATH LAB;  Service: Cardiovascular;  Laterality: N/A;  . PARTIAL HYSTERECTOMY    . [place] CATHETER INSERTION     Patient Active Problem List   Diagnosis Date Noted  . Goals of care, counseling/discussion   . Palliative care by specialist   . ESRD (end stage renal disease) on dialysis ([place])   . Other cirrhosis of liver ([place])   . Lung nodule   . Anemia due to chronic kidney disease, on chronic dialysis ([place])   . Hyperkalemia 03/17/2019  . Abnormal thyroid blood test 11/07/2018  . Uncontrolled type 2 diabetes mellitus with hyperglycemia ([place]) 07/25/2018  . Hyperthyroidism 07/05/2018  . Melena 10/08/2013  . Cardiomyopathy ([place]) 07/12/2013  . Physical deconditioning 07/06/2013  . NSTEMI (non-ST elevated myocardial infarction) ([place]) 07/02/2013  . Community acquired pneumonia 07/02/2013  . Sepsis(995.91) 07/02/2013  . Acute respiratory failure ([place]) 07/02/2013  . Acute systolic  CHF (congestive heart failure) ([place]) 07/02/2013  . 10 year risk of MI or stroke 7.5% or greater   . HEMOCCULT POSITIVE STOOL 05/15/2009  . ANEMIA, IRON DEFICIENCY, HX OF 05/15/2009  . DM 05/14/2009  . HYPERCHOLESTEROLEMIA 05/14/2009  . MIGRAINE HEADACHE 05/14/2009  . HYPERTENSION 05/14/2009  . GERD 05/14/2009  . CHOLELITHIASIS 05/14/2009  . NEPHROLITHIASIS 05/14/2009  . BURSITIS 05/14/2009      Prior to Admission medications   Medication Sig Start Date End Date Taking? Authorizing Provider  acetaminophen (TYLENOL) 500 MG tablet Take 500 mg by mouth every 6 (six)  hours as needed for mild pain or fever.    [provider]  B Complex-C-Folic Acid (NEPHRO VITAMINS) 0.8 MG TABS Take 1 tablet by mouth daily. 01/19/19   [provider]  buPROPion (WELLBUTRIN SR) 100 MG 12 hr tablet Take 100 mg by mouth daily.     [provider]  cholecalciferol (VITAMIN D) 1000 UNITS tablet Take 2,000 Units by mouth daily.    [provider]  doxycycline (VIBRA-TABS) 100 MG tablet Take 1 tablet (100 mg total) by mouth 2 (two) times daily. 03/22/19   Mayo, Pete Pelt, MD  FLUoxetine (PROZAC) 40 MG capsule Take 40 mg by mouth daily.    [provider]  lactulose (CHRONULAC) 10 GM/15ML solution Take 10 g by mouth 2 (two) times daily.    [provider]  magnesium oxide (MAG-OX) 400 MG tablet Take 400 mg by mouth 2 (two) times daily.    [provider]  midodrine (PROAMATINE) 10 MG tablet Take 10 mg by mouth 3 (three) times daily after meals. 02/26/19   [provider]  Multiple Vitamins-Minerals (PRESERVISION AREDS) TABS Take 1 tablet by mouth daily.     [provider]  nystatin (MYCOSTATIN) 100000 UNIT/ML suspension Take 5 mLs by mouth 3 (three) times daily. 02/26/19   [provider]  pantoprazole (PROTONIX) 40 MG tablet Take 40 mg by mouth 2 (two) times daily. 02/26/19   [provider]  torsemide (DEMADEX) 20 MG tablet Take 40 mg by mouth daily.  02/28/19   [provider]  traZODone (DESYREL) 50 MG tablet Take 100 mg by mouth at bedtime.     [provider]  vitamin B-12 (CYANOCOBALAMIN) 500 MCG tablet Take 500 mcg by mouth daily.    [provider]    Allergies Codeine    Social History Social History   Tobacco Use  . Smoking status: Former Smoker    Packs/day: 1.00    Years: 43.00    Pack years: 43.00    Types: Cigarettes    Start date: 08/31/1959    Quit date: 08/30/2002    Years since quitting: 16.5  . Smokeless tobacco: Never Used  . Tobacco  comment: Quit x 10 years  Substance Use Topics  . Alcohol use: No  . Drug use: No    Review of Systems Patient denies headaches, rhinorrhea, blurry vision, numbness, shortness of breath, chest pain, edema, cough, abdominal pain, nausea, vomiting, diarrhea, dysuria, fevers, rashes or hallucinations unless otherwise stated above in HPI. ____________________________________________   PHYSICAL EXAM:  VITAL SIGNS: Vitals:   03/30/19 1854  BP: (!) 94/43  Pulse: 86  Resp: 16  Temp: 98.7 F (37.1 C)  SpO2: 97%    Constitutional: Alert , ill appearing Eyes: Conjunctivae are normal.  Head: Atraumatic. Nose: No congestion/rhinnorhea. Mouth/Throat: Mucous membranes are moist.   Neck: No stridor. Painless ROM.  Cardiovascular: Normal rate, regular rhythm.  Grossly normal heart sounds.  Good peripheral circulation. Respiratory: Normal respiratory effort.  No retractions. Lungs CTAB. Gastrointestinal: Soft and nontender. No distention. No abdominal bruits. No CVA tenderness. Genitourinary: deferred Musculoskeletal: No lower extremity tenderness nor edema.  No joint effusions. Neurologic:  Normal speech and language. No gross focal neurologic deficits are appreciated. No facial droop Skin:  Skin is warm, dry and intact. No rash noted. Psychiatric: Mood and affect are normal. Speech and behavior are normal.  ____________________________________________   LABS (all labs ordered are listed, but only abnormal results are displayed)  Results for orders placed or performed during the hospital encounter of 03/30/19 (from the past 24 hour(s))  Lipase, blood     Status: Abnormal   Collection Time: 03/30/19  6:13 PM  Result Value Ref Range   Lipase 97 (H) 11 - 51 U/L  Comprehensive metabolic panel     Status: Abnormal   Collection Time: 03/30/19  6:13 PM  Result Value Ref Range   Sodium 124 (L) 135 - 145 mmol/L   Potassium 4.9 3.5 - 5.1 mmol/L   Chloride 87 (L) 98 - 111 mmol/L   CO2 16  (L) 22 - 32 mmol/L   Glucose, Bld 135 (H) 70 - 99 mg/dL   BUN 117 (H) 8 - 23 mg/dL   Creatinine, Ser 12.10 (H) 0.44 - 1.00 mg/dL   Calcium 6.2 (LL) 8.9 - 10.3 mg/dL   Total Protein 5.1 (L) 6.5 - 8.1 g/dL   Albumin 2.5 (L) 3.5 - 5.0 g/dL   AST 30 15 - 41 U/L   ALT 37 0 - 44 U/L   Alkaline Phosphatase 160 (H) 38 - 126 U/L   Total Bilirubin 0.8 0.3 - 1.2 mg/dL   GFR calc non Af Amer 3 (L) >60 mL/min   GFR calc Af Amer 3 (L) >60 mL/min   Anion gap 21 (H) 5 - 15  CBC     Status: Abnormal   Collection Time: 03/30/19  6:13 PM  Result Value Ref Range   WBC 19.1 (H) 4.0 - 10.5 K/uL   RBC 2.71 (L) 3.87 - 5.11 MIL/uL   Hemoglobin 7.6 (L) 12.0 - 15.0 g/dL   HCT 22.8 (L) 36.0 - 46.0 %   MCV 84.1 80.0 - 100.0 fL   MCH 28.0 26.0 - 34.0 pg   MCHC 33.3 30.0 - 36.0 g/dL   RDW 15.6 (H) 11.5 - 15.5 %   Platelets 218 150 - 400 K/uL   nRBC 0.0 0.0 - 0.2 %  Lactic acid, plasma     Status: None   Collection Time: 03/30/19  6:13 PM  Result Value Ref Range   Lactic Acid, Venous 1.6 0.5 - 1.9 mmol/L   ____________________________________________  EKG My review and personal interpretation at Time: 18:22   Indication: abd pain  Rate: 90  Rhythm: sinus Axis: normal Other: nonspecific st abn ____________________________________________  RADIOLOGY   ____________________________________________   PROCEDURES  Procedure(s) performed:  Procedures    Critical Care performed: no ____________________________________________   INITIAL IMPRESSION / ASSESSMENT AND PLAN / ED COURSE  Pertinent labs & imaging results that were available during my care of the patient were reviewed by me and considered in my medical decision making (see chart for details).   DDX: sbp, acute renal failure, sepsis, cirrhosis  ABI SHOULTS is a 78 y.o. who presents to the ED with sx as described above.  Concerning presentation for sbp.  Blood work with evidence of acute on chronic renal  failure.  Discussed case  with Dr. Juleen China of nephrology who agrees with initiating abx as wont be able to obtain culture from peritoneal fluid for a few more hours.  Will discuss with hospitalist for admission.     The patient was evaluated in Emergency Department today for the symptoms described in the history of present illness. He/she was evaluated in the context of the global COVID-19 pandemic, which necessitated consideration that the patient might be at risk for infection with the SARS-CoV-2 virus that causes COVID-19. Institutional protocols and algorithms that pertain to the evaluation of patients at risk for COVID-19 are in a state of rapid change based on information released by regulatory bodies including the CDC and federal and state organizations. These policies and algorithms were followed during the patient's care in the ED.  As part of my medical decision making, I reviewed the following data within the [place] notes reviewed and incorporated, Labs reviewed, notes from prior ED visits and [place] Controlled Substance Database   ____________________________________________   FINAL CLINICAL IMPRESSION(S) / ED DIAGNOSES  Final diagnoses:  Generalized abdominal pain  Acute renal failure superimposed on chronic kidney disease, unspecified CKD stage, unspecified acute renal failure type ([place])      NEW MEDICATIONS STARTED DURING THIS VISIT:  New Prescriptions   No medications on file     Note:  This document was prepared using Dragon voice recognition software and may include unintentional dictation errors.    Merlyn Lot, MD 03/30/19 2034

## 2019-03-30 NOTE — ED Triage Notes (Addendum)
Pt c/o lower abdominal pain.  Had PD catheter placed in July.  Pt has just started transitioning to PD.  Still has permcath as well.  PD fluid is cloudy per daughter normally because of ascites.  Pt has had severe diarrhea for couple days. No fevers.  Pt also having tailbone pain from fall yesterday.  Generalized weakness noted.    Pt does not appear to feel well.  Daughter reports liquid diarrhea.  Family notified to let RN know if going to have bowel movement.  Has had generalized weakness and shaking.  Has had lot of leg cramping.  Last heme dialysis was when in hospital and had PD monday, Tuesday and Wednesday this week.

## 2019-03-31 DIAGNOSIS — Z992 Dependence on renal dialysis: Secondary | ICD-10-CM | POA: Diagnosis not present

## 2019-03-31 DIAGNOSIS — N186 End stage renal disease: Secondary | ICD-10-CM | POA: Diagnosis not present

## 2019-03-31 LAB — BODY FLUID CELL COUNT WITH DIFFERENTIAL
Eos, Fluid: 0 %
Lymphs, Fluid: 30 %
Monocyte-Macrophage-Serous Fluid: 48 %
Neutrophil Count, Fluid: 22 %
Total Nucleated Cell Count, Fluid: 120 cu mm

## 2019-03-31 LAB — CBC
HCT: 19.9 % — ABNORMAL LOW (ref 36.0–46.0)
Hemoglobin: 6.7 g/dL — ABNORMAL LOW (ref 12.0–15.0)
MCH: 28.2 pg (ref 26.0–34.0)
MCHC: 33.7 g/dL (ref 30.0–36.0)
MCV: 83.6 fL (ref 80.0–100.0)
Platelets: 189 10*3/uL (ref 150–400)
RBC: 2.38 MIL/uL — ABNORMAL LOW (ref 3.87–5.11)
RDW: 15.7 % — ABNORMAL HIGH (ref 11.5–15.5)
WBC: 19.6 10*3/uL — ABNORMAL HIGH (ref 4.0–10.5)
nRBC: 0 % (ref 0.0–0.2)

## 2019-03-31 LAB — GASTROINTESTINAL PANEL BY PCR, STOOL (REPLACES STOOL CULTURE)

## 2019-03-31 LAB — BASIC METABOLIC PANEL
Anion gap: 21 — ABNORMAL HIGH (ref 5–15)
BUN: 129 mg/dL — ABNORMAL HIGH (ref 8–23)
CO2: 14 mmol/L — ABNORMAL LOW (ref 22–32)
Calcium: 6.8 mg/dL — ABNORMAL LOW (ref 8.9–10.3)
Chloride: 89 mmol/L — ABNORMAL LOW (ref 98–111)
Creatinine, Ser: 12.32 mg/dL — ABNORMAL HIGH (ref 0.44–1.00)
GFR calc Af Amer: 3 mL/min — ABNORMAL LOW (ref >60)
GFR calc non Af Amer: 3 mL/min — ABNORMAL LOW (ref >60)
Glucose, Bld: 87 mg/dL (ref 70–99)
Potassium: 4.7 mmol/L (ref 3.5–5.1)
Sodium: 124 mmol/L — ABNORMAL LOW (ref 135–145)

## 2019-03-31 LAB — MAGNESIUM: Magnesium: 1.7 mg/dL (ref 1.7–2.4)

## 2019-03-31 LAB — LACTIC ACID, PLASMA: Lactic Acid, Venous: 1.5 mmol/L (ref 0.5–1.9)

## 2019-03-31 LAB — PHOSPHORUS: Phosphorus: 14.4 mg/dL — ABNORMAL HIGH (ref 2.5–4.6)

## 2019-03-31 MED ORDER — LOPERAMIDE HCL 2 MG PO CAPS
2.0000 mg | ORAL_CAPSULE | Freq: Four times a day (QID) | ORAL | Status: DC | PRN
  Administered 2019-03-31 – 2019-04-01 (×3): 2 mg via ORAL
  Filled 2019-03-31 (×3): qty 1

## 2019-03-31 MED ORDER — PIPERACILLIN-TAZOBACTAM 3.375 G IVPB
3.3750 g | Freq: Two times a day (BID) | INTRAVENOUS | Status: DC
  Administered 2019-03-31 – 2019-04-03 (×7): 3.375 g via INTRAVENOUS
  Filled 2019-03-31 (×7): qty 50

## 2019-03-31 MED ORDER — SODIUM CHLORIDE 0.9% IV SOLUTION
Freq: Once | INTRAVENOUS | Status: AC
  Administered 2019-03-31: 17:00:00 via INTRAVENOUS

## 2019-03-31 MED ORDER — DELFLEX-LC/1.5% DEXTROSE 344 MOSM/L IP SOLN
INTRAPERITONEAL | Status: DC
  Administered 2019-03-31 – 2019-04-01 (×2): 6000 mL via INTRAPERITONEAL
  Administered 2019-04-03: 08:00:00 via INTRAPERITONEAL
  Administered 2019-04-05: 9 L via INTRAPERITONEAL
  Filled 2019-03-31 (×5): qty 3000

## 2019-03-31 MED ORDER — GENTAMICIN SULFATE 0.1 % EX CREA
1.0000 "application " | TOPICAL_CREAM | Freq: Every day | CUTANEOUS | Status: DC
  Administered 2019-03-31 – 2019-04-06 (×6): 1 via TOPICAL
  Filled 2019-03-31 (×2): qty 15

## 2019-03-31 MED ORDER — EPOETIN ALFA 10000 UNIT/ML IJ SOLN: 10000.0000 [IU] | INTRAMUSCULAR | Status: DC

## 2019-03-31 MED ORDER — CHLORHEXIDINE GLUCONATE CLOTH 2 % EX PADS
6.0000 | MEDICATED_PAD | Freq: Every day | CUTANEOUS | Status: DC
  Administered 2019-03-31 – 2019-04-06 (×5): 6 via TOPICAL

## 2019-03-31 MED ORDER — ONDANSETRON HCL 4 MG/2ML IJ SOLN
4.0000 mg | Freq: Four times a day (QID) | INTRAMUSCULAR | Status: DC | PRN
  Administered 2019-03-31 – 2019-04-05 (×4): 4 mg via INTRAVENOUS
  Filled 2019-03-31 (×4): qty 2

## 2019-03-31 NOTE — Progress Notes (Signed)
Pre CCPD Assessment    03/31/19 1500  Neurological  Level of Consciousness Alert  Orientation Level Oriented X4;Oriented to person;Oriented to place;Oriented to time;Oriented to situation  Respiratory  Respiratory Pattern Regular;Labored  Chest Assessment Chest expansion symmetrical  Bilateral Breath Sounds Clear;Diminished  Cough None  Cardiac  Pulse Regular  Heart Sounds S1, S2  ECG Monitor Yes  Cardiac Rhythm NSR  Vascular  R Radial Pulse +2  L Radial Pulse +2  Peritoneal Catheter Right lower abdomen  No Placement Date or Time found.   Catheter Location: Right lower abdomen  Site Assessment Clean;Dry;Intact  Drainage Description None  Catheter status Deaccessed;Patent  Dressing Gauze/Drain sponge  Dressing Status Clean;Dry;Intact  Dressing Intervention New dressing  Psychosocial  Psychosocial (WDL) WDL

## 2019-03-31 NOTE — Progress Notes (Signed)
Patient brought down to hemodialysis unit where patient's right tunneled dialysis catheter was found to be nonfunctioning due to clotting. Hemodialysis treatment was then cancelled. Peritoneal exchange was done and labs sent: cell count and culture.  It was decided to have patient undergo PRBC transfusion in her room without hemodialysis.  Will schedule peritoneal dialysis for later today. Orders prepared.   [name]  2:08 PM  03/31/19

## 2019-03-31 NOTE — Progress Notes (Signed)
[facility]  ROUNDING NOTE   Subjective:   [name] admitted to [facility] on 03/30/2019 for Generalized abdominal pain [R10.84] Acute renal failure superimposed on chronic kidney disease, unspecified CKD stage, unspecified acute renal failure type ([place]) [N17.9, N18.9]  Missed peritoneal dialysis due to diarrhea for last two nights. Complains of diarrhea and abdominal pain.   Found to have hemoglobin of 6.7.   Objective:  Vital signs in last 24 hours:  Temp:  [97.5 F (36.4 C)-98.7 F (37.1 C)] 97.9 F (36.6 C) (08/01 0639) Pulse Rate:  [78-86] 85 (08/01 0902) Resp:  [6-20] 18 (08/01 0639) BP: (94-106)/(43-59) 102/59 (08/01 0902) SpO2:  [96 %-100 %] 99 % (08/01 0639) Weight:  [42.2 kg] 42.2 kg (07/31 1807)  Weight change:  Filed Weights   03/30/19 1807  Weight: 42.2 kg    Intake/Output: I/O last 3 completed shifts: In: 16.6 [IV Piggyback:16.6] Out: 1 [Emesis/NG output:1]   Intake/Output this shift:  Total I/O In: 240 [P.O.:240] Out: -   Physical Exam: General: Ill appearing, laying in bed  Head: Moist oral mucosal membranes  Eyes: Anicteric, PERRL  Neck: Supple, trachea midline  Lungs:  Clear to auscultation  Heart: Regular rate and rhythm  Abdomen:  Soft, nontender, +ascites  Extremities: no peripheral edema.  Neurologic: Nonfocal, moving all four extremities  Skin: No lesions  Access: Right lower quadrant PD catheter, RIJ permcath    Basic Metabolic Panel: Recent Labs  Lab 03/30/19 1221 03/30/19 1813 03/30/19 2327 03/31/19 0538  NA 120* 124*  --  124*  K 5.1 4.9  --  4.7  CL 85* 87*  --  89*  CO2 17* 16*  --  14*  GLUCOSE 231* 135*  --  87  BUN 115* 117*  --  129*  CREATININE 11.15* 12.10*  --  12.32*  CALCIUM 6.2* 6.2*  --  6.8*  MG  --   --  1.7  --     Liver Function Tests: Recent Labs  Lab 03/30/19 1221 03/30/19 1813  AST 30 30  ALT 37 37  ALKPHOS 137* 160*  BILITOT 0.8 0.8  PROT 5.0* 5.1*  ALBUMIN 2.3* 2.5*    Recent Labs  Lab 03/30/19 1813  LIPASE 97*   No results for input(s): AMMONIA in the last 168 hours.  CBC: Recent Labs  Lab 03/30/19 1221 03/30/19 1813 03/31/19 0538  WBC 15.9* 19.1* 19.6*  NEUTROABS 11.9*  --   --   HGB 7.5* 7.6* 6.7*  HCT 22.7* 22.8* 19.9*  MCV 86.0 84.1 83.6  PLT 193 218 189    Cardiac Enzymes: No results for input(s): CKTOTAL, CKMB, CKMBINDEX, TROPONINI in the last 168 hours.  BNP: Invalid input(s): POCBNP  CBG: No results for input(s): GLUCAP in the last 168 hours.  Microbiology: Results for orders placed or performed during the hospital encounter of 03/30/19  Blood Culture (routine x 2)     Status: None (Preliminary result)   Collection Time: 03/30/19  7:41 PM   Specimen: BLOOD  Result Value Ref Range Status   Specimen Description BLOOD LEFT ANTECUBITAL  Final   Special Requests   Final    BOTTLES DRAWN AEROBIC AND ANAEROBIC Blood Culture adequate volume   Culture   Final    NO GROWTH < 24 HOURS Performed at [facility], [address] [place], [place] [address]    Report Status PENDING  Incomplete  Blood Culture (routine x 2)     Status: None (Preliminary  result)   Collection Time: 03/30/19  7:42 PM   Specimen: BLOOD  Result Value Ref Range Status   Specimen Description BLOOD RIGHT ANTECUBITAL  Final   Special Requests   Final    BOTTLES DRAWN AEROBIC AND ANAEROBIC Blood Culture results may not be optimal due to an excessive volume of blood received in culture bottles   Culture   Final    NO GROWTH < 24 HOURS Performed at [facility], [address], [place], [place] [address]    Report Status PENDING  Incomplete  SARS Coronavirus 2 [facility] order, Performed in [place] hospital lab)     Status: None   Collection Time: 03/30/19  7:42 PM  Result Value Ref Range Status   SARS Coronavirus 2 NEGATIVE NEGATIVE Final    Comment: (NOTE) If result is NEGATIVE SARS-CoV-2 target nucleic acids are NOT  DETECTED. The SARS-CoV-2 RNA is generally detectable in upper and lower  respiratory specimens during the acute phase of infection. The lowest  concentration of SARS-CoV-2 viral copies this assay can detect is 250  copies / mL. A negative result does not preclude SARS-CoV-2 infection  and should not be used as the sole basis for treatment or other  patient management decisions.  A negative result may occur with  improper specimen collection / handling, submission of specimen other  than nasopharyngeal swab, presence of viral mutation(s) within the  areas targeted by this assay, and inadequate number of viral copies  (<250 copies / mL). A negative result must be combined with clinical  observations, patient history, and epidemiological information. If result is POSITIVE SARS-CoV-2 target nucleic acids are DETECTED. The SARS-CoV-2 RNA is generally detectable in upper and lower  respiratory specimens dur ing the acute phase of infection.  Positive  results are indicative of active infection with SARS-CoV-2.  Clinical  correlation with patient history and other diagnostic information is  necessary to determine patient infection status.  Positive results do  not rule out bacterial infection or co-infection with other viruses. If result is PRESUMPTIVE POSTIVE SARS-CoV-2 nucleic acids MAY BE PRESENT.   A presumptive positive result was obtained on the submitted specimen  and confirmed on repeat testing.  While 2019 novel coronavirus  (SARS-CoV-2) nucleic acids may be present in the submitted sample  additional confirmatory testing may be necessary for epidemiological  and / or clinical management purposes  to differentiate between  SARS-CoV-2 and other Sarbecovirus currently known to infect humans.  If clinically indicated additional testing with an alternate test  methodology [phone]) is advised. The SARS-CoV-2 RNA is generally  detectable in upper and lower respiratory sp ecimens during  the acute  phase of infection. The expected result is Negative. Fact Sheet for Patients:  StrictlyIdeas.no Fact Sheet for Healthcare Providers: BankingDealers.co.za This test is not yet approved or cleared by the [place] FDA and has been authorized for detection and/or diagnosis of SARS-CoV-2 by FDA under an Emergency Use Authorization (EUA).  This EUA will remain in effect (meaning this test can be used) for the duration of the COVID-19 declaration under Section 564(b)(1) of the Act, 21 U.S.C. section 360bbb-3(b)(1), unless the authorization is terminated or revoked sooner. Performed at [facility], [address] [place], [place] [address]   Gastrointestinal Panel by PCR , Stool     Status: None   Collection Time: 03/30/19 11:36 PM   Specimen: Stool  Result Value Ref Range Status   Campylobacter species NOT DETECTED NOT DETECTED Final  Plesimonas shigelloides NOT DETECTED NOT DETECTED Final   Salmonella species NOT DETECTED NOT DETECTED Final   Yersinia enterocolitica NOT DETECTED NOT DETECTED Final   Vibrio species NOT DETECTED NOT DETECTED Final   Vibrio cholerae NOT DETECTED NOT DETECTED Final   Enteroaggregative E coli (EAEC) NOT DETECTED NOT DETECTED Final   Enteropathogenic E coli (EPEC) NOT DETECTED NOT DETECTED Final   Enterotoxigenic E coli (ETEC) NOT DETECTED NOT DETECTED Final   Shiga like toxin producing E coli (STEC) NOT DETECTED NOT DETECTED Final   Shigella/Enteroinvasive E coli (EIEC) NOT DETECTED NOT DETECTED Final   Cryptosporidium NOT DETECTED NOT DETECTED Final   Cyclospora cayetanensis NOT DETECTED NOT DETECTED Final   Entamoeba histolytica NOT DETECTED NOT DETECTED Final   Giardia lamblia NOT DETECTED NOT DETECTED Final   Adenovirus F40/41 NOT DETECTED NOT DETECTED Final   Astrovirus NOT DETECTED NOT DETECTED Final   Norovirus GI/GII NOT DETECTED NOT DETECTED Final   Rotavirus A NOT DETECTED  NOT DETECTED Final   Sapovirus (I, II, IV, and V) NOT DETECTED NOT DETECTED Final    Comment: Performed at [facility], [address] [place], [place] [address]    Coagulation Studies: No results for input(s): LABPROT, INR in the last 72 hours.  Urinalysis: No results for input(s): COLORURINE, LABSPEC, PHURINE, GLUCOSEU, HGBUR, BILIRUBINUR, KETONESUR, PROTEINUR, UROBILINOGEN, NITRITE, LEUKOCYTESUR in the last 72 hours.  Invalid input(s): APPERANCEUR    Imaging: Ct Abdomen Pelvis Wo Contrast  Result Date: 03/30/2019 CLINICAL DATA:  Abdominal pain EXAM: CT ABDOMEN AND PELVIS WITHOUT CONTRAST TECHNIQUE: Multidetector CT imaging of the abdomen and pelvis was performed following the standard protocol without IV contrast. COMPARISON:  CT dated 06/28/2013. FINDINGS: Lower chest: The intracardiac blood pool is hypodense relative to the adjacent myocardium consistent with anemia.The heart size is normal. The patient's dialysis catheter extends into the right atrium. Hepatobiliary: There is cirrhosis. Status post cholecystectomy.There is no biliary ductal dilation. Pancreas: Normal contours without ductal dilatation. No peripancreatic fluid collection. Spleen: The spleen is enlarged Adrenals/Urinary Tract: --Adrenal glands: No adrenal hemorrhage. --Right kidney/ureter: There is a poorly characterized cystic appearing mass in the interpolar region of the right kidney measuring approximately 2.9 x 2 cm. This mass demonstrates some peripheral calcification. --Left kidney/ureter: There is a complex 2.6 x 1.8 cm mass in the upper pole the left kidney. This mass contains both cystic and solid-appearing components. There is a coarse calcification in the interpolar region of the left kidney. There is an exophytic cyst measuring 2.7 cm arising from the lower pole. There is an adjacent more dense exophytic structure measuring approximately 1.5 cm. There are few small calcifications in the proximal left  ureter measuring approximately 4 mm. There is no significant left-sided collecting system dilatation. --Urinary bladder: The bladder is decompressed and therefore poorly evaluated Stomach/Bowel: --Stomach/Duodenum: There is a small hiatal hernia. --Small bowel: No dilatation or inflammation. --Colon: There is diffuse wall thickening of the transverse colon and ascending colon. --Appendix: Not visualized. No right lower quadrant inflammation or free fluid. Vascular/Lymphatic: Atherosclerotic calcification is present within the non-aneurysmal abdominal aorta, without hemodynamically significant stenosis. --there is scattered prominent nonspecific retroperitoneal lymph nodes. --there are mild scattered prominent mesenteric lymph nodes. --No pelvic or inguinal lymphadenopathy. Reproductive: Patient is status post hysterectomy. There is a complex appearing 2.7 by 2.1 cm mass in the expected region of the right ovary. Other: There is a moderate volume of abdominal ascites. A peritoneal dialysis catheter is noted and is coiled in the right  mid abdomen adjacent to the right hepatic lobe. The abdominal wall is normal. Musculoskeletal. No acute displaced fractures. IMPRESSION: 1. Cirrhosis with sequela of portal hypertension including moderate volume abdominal ascites and splenomegaly. 2. Diffuse wall thickening of the transverse colon and ascending colon is concerning for infectious or inflammatory colitis. 3. Complex bilateral renal masses, not well characterized on this exam. Follow-up with a contrast-enhanced outpatient renal mass protocol CT is recommended for further evaluation as an underlying renal cell carcinoma is not excluded on this exam. 4. Complex 2.7 cm right ovarian mass. Follow-up with a nonemergent outpatient ultrasound is recommended. 5. A peritoneal dialysis catheter is noted as above. 6.  Aortic Atherosclerosis (ICD10-I70.0). Electronically Signed   By: Constance Holster M.D.   On: 03/30/2019 22:48    Dg Chest 1 View  Result Date: 03/30/2019 CLINICAL DATA:  Pneumonia EXAM: CHEST  1 VIEW COMPARISON:  March 18, 2019 FINDINGS: The tunneled dialysis catheter is well position. The right suprahilar pulmonary nodules better visualized on prior CT. There is no pneumothorax. No large pleural effusion. The heart size is stable from prior study. Aortic calcifications are noted. IMPRESSION: 1. No acute cardiopulmonary process. 2. Known right suprahilar pulmonary nodule is better visualized on prior CT. Electronically Signed   By: Constance Holster M.D.   On: 03/30/2019 20:16     Medications:   . sodium chloride 250 mL (03/31/19 0913)  . piperacillin-tazobactam (ZOSYN)  IV 3.375 g (03/31/19 0915)   . sodium chloride   Intravenous Once  . buPROPion  100 mg Oral Daily  . Chlorhexidine Gluconate Cloth  6 each Topical Q0600  . cholecalciferol  2,000 Units Oral Daily  . epoetin (EPOGEN/PROCRIT) injection  10,000 Units Intravenous Q T,Th,Sa-HD  . FLUoxetine  40 mg Oral Daily  . heparin  5,000 Units Subcutaneous Q12H  . midodrine  10 mg Oral TID PC  . multivitamin-lutein  1 capsule Oral Daily  . pantoprazole  40 mg Oral BID  . sodium chloride flush  3 mL Intravenous Q12H  . vitamin B-12  500 mcg Oral Daily   sodium chloride, acetaminophen **OR** acetaminophen, sodium chloride flush  Assessment/ Plan:  [name] is a 78 y.o. 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   1.  End-stage renal disease: missed peritoneal dialysis last two  nights.  History of chylous ascites. Cell count from 7/19 could be peritonitis versus chylous ascites. Culture negative.   2. Hyponatremia  3. Metabolic acidosis  4. Anemia of chronic kidney disease: hemoglobin 6.7  Plan - Back up hemodialysis treatment today due to uremia, metabolic acidosis and hyponatremia. Orders prepared - Check peritoneal fluid with cell count and culture.  - PRBC blood transfusion ordered.  - Hold EPO due to malignancy.    LOS: 1 Shandricka Monroy 8/1/202010:29 AM

## 2019-03-31 NOTE — Consult Note (Signed)
Pharmacy Antibiotic Note  [name] is a 78 y.o. female admitted on 03/30/2019 with colitis.  Pharmacy has been consulted for Zosyn dosing. She has just started transitioning to PD and had PD monday, Tuesday and Wednesday this week  Plan: Zosyn 3.375g IV q12h (4 hour infusion).  Height: 5\' 2"  (157.5 cm) Weight: 93 lb (42.2 kg) IBW/kg (Calculated) : 50.1  Temp (24hrs), Avg:98 F (36.7 C), Min:97.5 F (36.4 C), Max:98.7 F (37.1 C)  Recent Labs  Lab 03/30/19 1221 03/30/19 1813 03/30/19 2327 03/31/19 0538  WBC 15.9* 19.1*  --  19.6*  CREATININE 11.15* 12.10*  --  12.32*  LATICACIDVEN  --  1.6 1.5  --     Estimated Creatinine Clearance: 2.5 mL/min (A) (by C-G formula based on SCr of 12.32 mg/dL (H)).    Allergies  Allergen Reactions  . Codeine Other (See Comments)    Reaction unknown     Antimicrobials this admission: Zosyn 8/1 >>  vancomycin 7/31 x1  Microbiology results: 7/31 BCx: NGTD 7/31 C diff: negative, GI panel negative  7/31 SARS CoV-2: negative   Thank you for allowing pharmacy to be a part of this patient's care.  Dallie Piles, PharmD 03/31/2019 8:06 AM

## 2019-03-31 NOTE — Progress Notes (Signed)
Upon assessment of patients CVC catheter for Hemodialysis, the catheter is non functional due to clotting, MD was made aware HD treatment canceled for today and pt will have Peritoneal dialysis this evening. Primary RN notified as well.

## 2019-03-31 NOTE — Progress Notes (Signed)
[facility] Tx started    03/31/19 1530  Cycler Setup  Total Number of Exchanges 4  Fill Volume 1500  Last Fill Volume 0  Fill Time - Minute(s) 10  Dwell Time - Hour(s) 1  Dwell Time - Minute(s) 41  Completion  Exit Site Care Performed No (Comment)  Cell Count on Daytime Exchange Sent  Treatment Status Started  Fluid Balance - CCPD  Total Intake for Exchanges (mL) 6000 ml  Education / Care Plan  Dialysis Education Provided Yes  Documented Education in Care Plan Yes  Hand-Off documentation  Report given to (Full Name) Beatris Ship, RN   Report received from (Full Name) Rochel Brome, RN

## 2019-03-31 NOTE — Progress Notes (Signed)
[facility] NAME: Annasophia Crocker    MR#:  [id number]  DATE OF BIRTH:  Dec 06, 1940  SUBJECTIVE:  CHIEF COMPLAINT:   Chief Complaint  Patient presents with  . Abdominal Pain   Patient still reported having diarrhea this morning.  Some abdominal pains.  No fevers. REVIEW OF SYSTEMS:  Review of Systems  Constitutional: Negative for chills and fever.  HENT: Negative for hearing loss and tinnitus.   Eyes: Negative for blurred vision and double vision.  Respiratory: Negative for cough and shortness of breath.   Cardiovascular: Negative for chest pain and palpitations.  Gastrointestinal: Positive for abdominal pain and nausea. Negative for heartburn and vomiting.  Genitourinary: Negative for dysuria and urgency.  Musculoskeletal: Negative for myalgias and neck pain.  Skin: Negative for itching and rash.  Neurological: Negative for dizziness and headaches.  Psychiatric/Behavioral: Negative for depression and hallucinations.    DRUG ALLERGIES:   Allergies  Allergen Reactions  . Codeine Other (See Comments)    Reaction unknown    VITALS:  Blood pressure (!) 100/45, pulse 88, temperature 98.4 F (36.9 C), temperature source Oral, resp. rate 16, height 5\' 2"  (1.575 m), weight 42.2 kg, SpO2 100 %. PHYSICAL EXAMINATION:  Physical Exam  GENERAL:  77 y.o.-year-old patient lying in the bed with no acute distress.  EYES: Pupils equal, round, reactive to light and accommodation. No scleral icterus. Extraocular muscles intact.  HEENT: Head atraumatic, normocephalic. Oropharynx and nasopharynx clear.  NECK:  Supple, no jugular venous distention. No thyroid enlargement, no tenderness.  LUNGS: Normal breath sounds bilaterally, no wheezing, rales,rhonchi or crepitation. No use of accessory muscles of respiration.  CARDIOVASCULAR: S1, S2 normal. No murmurs, rubs, or gallops.  ABDOMEN: Soft, lower abdominal tenderness PD catheter in place,  nondistended. Bowel sounds present. No rebound or guarding  EXTREMITIES: No pedal edema, cyanosis, or clubbing.  NEUROLOGIC: Cranial nerves II through XII are intact. Muscle strength 5/5 in all extremities. Sensation intact. Gait not checked.  PSYCHIATRIC: The patient is currently sleepy  sKIN: No obvious rash, lesion, or ulcer.   LABORATORY PANEL:  Female CBC Recent Labs  Lab 03/31/19 0538  WBC 19.6*  HGB 6.7*  HCT 19.9*  PLT 189   ------------------------------------------------------------------------------------------------------------------ Chemistries  Recent Labs  Lab 03/30/19 1813 03/30/19 2327 03/31/19 0538  NA 124*  --  124*  K 4.9  --  4.7  CL 87*  --  89*  CO2 16*  --  14*  GLUCOSE 135*  --  87  BUN 117*  --  129*  CREATININE 12.10*  --  12.32*  CALCIUM 6.2*  --  6.8*  MG  --  1.7  --   AST 30  --   --   ALT 37  --   --   ALKPHOS 160*  --   --   BILITOT 0.8  --   --    RADIOLOGY:  Ct Abdomen Pelvis Wo Contrast  Result Date: 03/30/2019 CLINICAL DATA:  Abdominal pain EXAM: CT ABDOMEN AND PELVIS WITHOUT CONTRAST TECHNIQUE: Multidetector CT imaging of the abdomen and pelvis was performed following the standard protocol without IV contrast. COMPARISON:  CT dated 06/28/2013. FINDINGS: Lower chest: The intracardiac blood pool is hypodense relative to the adjacent myocardium consistent with anemia.The heart size is normal. The patient's dialysis catheter extends into the right atrium. Hepatobiliary: There is cirrhosis. Status post cholecystectomy.There is no biliary ductal dilation. Pancreas: Normal contours without ductal dilatation. No peripancreatic  fluid collection. Spleen: The spleen is enlarged Adrenals/Urinary Tract: --Adrenal glands: No adrenal hemorrhage. --Right kidney/ureter: There is a poorly characterized cystic appearing mass in the interpolar region of the right kidney measuring approximately 2.9 x 2 cm. This mass demonstrates some peripheral calcification.  --Left kidney/ureter: There is a complex 2.6 x 1.8 cm mass in the upper pole the left kidney. This mass contains both cystic and solid-appearing components. There is a coarse calcification in the interpolar region of the left kidney. There is an exophytic cyst measuring 2.7 cm arising from the lower pole. There is an adjacent more dense exophytic structure measuring approximately 1.5 cm. There are few small calcifications in the proximal left ureter measuring approximately 4 mm. There is no significant left-sided collecting system dilatation. --Urinary bladder: The bladder is decompressed and therefore poorly evaluated Stomach/Bowel: --Stomach/Duodenum: There is a small hiatal hernia. --Small bowel: No dilatation or inflammation. --Colon: There is diffuse wall thickening of the transverse colon and ascending colon. --Appendix: Not visualized. No right lower quadrant inflammation or free fluid. Vascular/Lymphatic: Atherosclerotic calcification is present within the non-aneurysmal abdominal aorta, without hemodynamically significant stenosis. --there is scattered prominent nonspecific retroperitoneal lymph nodes. --there are mild scattered prominent mesenteric lymph nodes. --No pelvic or inguinal lymphadenopathy. Reproductive: Patient is status post hysterectomy. There is a complex appearing 2.7 by 2.1 cm mass in the expected region of the right ovary. Other: There is a moderate volume of abdominal ascites. A peritoneal dialysis catheter is noted and is coiled in the right mid abdomen adjacent to the right hepatic lobe. The abdominal wall is normal. Musculoskeletal. No acute displaced fractures. IMPRESSION: 1. Cirrhosis with sequela of portal hypertension including moderate volume abdominal ascites and splenomegaly. 2. Diffuse wall thickening of the transverse colon and ascending colon is concerning for infectious or inflammatory colitis. 3. Complex bilateral renal masses, not well characterized on this exam.  Follow-up with a contrast-enhanced outpatient renal mass protocol CT is recommended for further evaluation as an underlying renal cell carcinoma is not excluded on this exam. 4. Complex 2.7 cm right ovarian mass. Follow-up with a nonemergent outpatient ultrasound is recommended. 5. A peritoneal dialysis catheter is noted as above. 6.  Aortic Atherosclerosis (ICD10-I70.0). Electronically Signed   By: Constance Holster M.D.   On: 03/30/2019 22:48   Dg Chest 1 View  Result Date: 03/30/2019 CLINICAL DATA:  Pneumonia EXAM: CHEST  1 VIEW COMPARISON:  March 18, 2019 FINDINGS: The tunneled dialysis catheter is well position. The right suprahilar pulmonary nodules better visualized on prior CT. There is no pneumothorax. No large pleural effusion. The heart size is stable from prior study. Aortic calcifications are noted. IMPRESSION: 1. No acute cardiopulmonary process. 2. Known right suprahilar pulmonary nodule is better visualized on prior CT. Electronically Signed   By: Constance Holster M.D.   On: 03/30/2019 20:16   ASSESSMENT AND PLAN:   Patient 78 year old white female who is presenting with abdominal pain and diarrhea  1.   Acute colitis involving the transverse colon and ascending colon Found on CT scan.  Patient presented with abdominal pain and diarrhea, C. difficile negative.  GI panel negative. Patient started empirically on IV Zosyn.  PRN Imodium due to persistent diarrhea.  Monitor clinically Also noted incidental findings on CT scan including cirrhosis with sequelae of portal hypertension as well as complex bilateral renal masses not well visualized.  Follow-up with contrast-enhanced outpatient renal mass protocol recommended.  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  Follow-up with non-emergent outpatient ultrasound also  recommended.  Prior diagnosis of lung mass. Patient was supposed to follow-up with pulmonologist as outpatient for further evaluation to include bronchoscopy with possible  biopsy.  I talked to Dr. Lillard Anes who will make arrangements for outpatient follow-up post discharge from the hospital.  2.  End-stage renal disease  Patient missed peritoneal dialysis last 2 nights. Initial plan was for hemodialysis.  However patient's permacath not functioning well.  Vascular surgery consulted.  Nephrologist making plans for peritoneal dialysis.  To check peritoneal fluid.  3.  Hyponatremia due to dehydration we will give her IV fluids To be managed with hemodialysis today.  4.  Hypotension continue midodrine  5.  Diabetes type 2 we will place on sliding scale insulin currently not on any medications at home  6.  Depression anxiety continue Wellbutrin and trazodone  7.    Acute on chronic anemia.   Hemoglobin down to 6.7.  1 unit of packed red blood cells transfusion already ordered by nephrologist.  Follow-up on CBC in a.m.    DVT prophylaxis; hold off on heparin due to anemia requiring packed red blood cell transfusion. SCDs  All the records are reviewed and case discussed with Care Management/Social Worker. Management plans discussed with the patient, family and they are in agreement. I called and updated patient's daughter [name] on treatment plans as outlined above.  All questions were answered and she is in agreement with the plan of care.  CODE STATUS: Full Code  TOTAL TIME TAKING CARE OF THIS PATIENT: 37 minutes.   More than 50% of the time was spent in counseling/coordination of care: YES  POSSIBLE D/C IN 3 DAYS, DEPENDING ON CLINICAL CONDITION.   Chamika Cunanan M.D on 03/31/2019 at 2:21 PM  Between 7am to 6pm - Pager - [phone]  After 6pm go to [email or web address] - Proofreader  [facility]  Office  [phone]  CC: Primary care physician; Caryl Bis, MD  Note: This dictation was prepared with Dragon dictation along with smaller phrase technology. Any transcriptional errors that result from this process  are unintentional.

## 2019-04-01 ENCOUNTER — Encounter: Payer: Self-pay | Admitting: Internal Medicine

## 2019-04-01 DIAGNOSIS — I480 Paroxysmal atrial fibrillation: Secondary | ICD-10-CM | POA: Insufficient documentation

## 2019-04-01 DIAGNOSIS — N186 End stage renal disease: Secondary | ICD-10-CM | POA: Diagnosis not present

## 2019-04-01 DIAGNOSIS — Z992 Dependence on renal dialysis: Secondary | ICD-10-CM | POA: Diagnosis not present

## 2019-04-01 LAB — BASIC METABOLIC PANEL
Anion gap: 18 — ABNORMAL HIGH (ref 5–15)
BUN: 116 mg/dL — ABNORMAL HIGH (ref 8–23)
CO2: 19 mmol/L — ABNORMAL LOW (ref 22–32)
Calcium: 6.2 mg/dL — CL (ref 8.9–10.3)
Chloride: 89 mmol/L — ABNORMAL LOW (ref 98–111)
Creatinine, Ser: 11.26 mg/dL — ABNORMAL HIGH (ref 0.44–1.00)
GFR calc Af Amer: 3 mL/min — ABNORMAL LOW (ref >60)
GFR calc non Af Amer: 3 mL/min — ABNORMAL LOW (ref >60)
Glucose, Bld: 93 mg/dL (ref 70–99)
Potassium: 3.6 mmol/L (ref 3.5–5.1)
Sodium: 126 mmol/L — ABNORMAL LOW (ref 135–145)

## 2019-04-01 LAB — CBC
HCT: 24.4 % — ABNORMAL LOW (ref 36.0–46.0)
Hemoglobin: 8.7 g/dL — ABNORMAL LOW (ref 12.0–15.0)
MCH: 29.3 pg (ref 26.0–34.0)
MCHC: 35.7 g/dL (ref 30.0–36.0)
MCV: 82.2 fL (ref 80.0–100.0)
Platelets: 162 10*3/uL (ref 150–400)
RBC: 2.97 MIL/uL — ABNORMAL LOW (ref 3.87–5.11)
RDW: 15 % (ref 11.5–15.5)
WBC: 20.1 10*3/uL — ABNORMAL HIGH (ref 4.0–10.5)
nRBC: 0 % (ref 0.0–0.2)

## 2019-04-01 LAB — MAGNESIUM: Magnesium: 1.6 mg/dL — ABNORMAL LOW (ref 1.7–2.4)

## 2019-04-01 LAB — PHOSPHORUS: Phosphorus: 11.7 mg/dL — ABNORMAL HIGH (ref 2.5–4.6)

## 2019-04-01 MED ORDER — ALPRAZOLAM 0.25 MG PO TABS
0.2500 mg | ORAL_TABLET | Freq: Once | ORAL | Status: AC
  Administered 2019-04-01: 0.25 mg via ORAL
  Filled 2019-04-01: qty 1

## 2019-04-01 MED ORDER — MAGNESIUM OXIDE 400 (241.3 MG) MG PO TABS
400.0000 mg | ORAL_TABLET | Freq: Once | ORAL | Status: AC
  Administered 2019-04-01: 400 mg via ORAL
  Filled 2019-04-01: qty 1

## 2019-04-01 MED ORDER — POTASSIUM CHLORIDE IN NACL 20-0.9 MEQ/L-% IV SOLN
INTRAVENOUS | Status: AC
  Administered 2019-04-01: 14:00:00 via INTRAVENOUS
  Filled 2019-04-01: qty 1000

## 2019-04-01 MED ORDER — TRAZODONE HCL 100 MG PO TABS
100.0000 mg | ORAL_TABLET | Freq: Every day | ORAL | Status: DC
  Administered 2019-04-01 – 2019-04-05 (×5): 100 mg via ORAL
  Filled 2019-04-01 (×5): qty 1

## 2019-04-01 NOTE — Progress Notes (Signed)
[facility]  ROUNDING NOTE   Subjective:   Peritoneal dialysis treatment last night. Patient complained of cramping during treatment.  UF of 2229mL.   Status post 1 unit PRBC  Objective:  Vital signs in last 24 hours:  Temp:  [98 F (36.7 C)-98.5 F (36.9 C)] 98.2 F (36.8 C) (08/02 0447) Pulse Rate:  [68-88] 75 (08/02 0447) Resp:  [12-18] 16 (08/02 0447) BP: (91-106)/(45-57) 91/54 (08/02 0447) SpO2:  [100 %] 100 % (08/02 0447)  Weight change:  Filed Weights   03/30/19 1807  Weight: 42.2 kg    Intake/Output: I/O last 3 completed shifts: In: 7289 [P.O.:720; I.V.:2.5; Blood:450; Other:6000; IV Piggyback:116.6] Out: 201 [Emesis/NG output:201]   Intake/Output this shift:  Total I/O In: 6060 [P.O.:60; Other:6000] Out: 8237 [Other:8237]  Physical Exam: General: Ill appearing, laying in bed  Head: Moist oral mucosal membranes  Eyes: Anicteric, PERRL  Neck: Supple, trachea midline  Lungs:  Clear to auscultation  Heart: Regular rate and rhythm  Abdomen:  Soft, nontender, +ascites  Extremities: no peripheral edema.  Neurologic: Nonfocal, moving all four extremities  Skin: No lesions  Access: Right lower quadrant PD catheter, RIJ permcath    Basic Metabolic Panel: Recent Labs  Lab 03/30/19 1221 03/30/19 1813 03/30/19 2327 03/31/19 0538 03/31/19 1043 04/01/19 0625  NA 120* 124*  --  124*  --  126*  K 5.1 4.9  --  4.7  --  3.6  CL 85* 87*  --  89*  --  89*  CO2 17* 16*  --  14*  --  19*  GLUCOSE 231* 135*  --  87  --  93  BUN 115* 117*  --  129*  --  116*  CREATININE 11.15* 12.10*  --  12.32*  --  11.26*  CALCIUM 6.2* 6.2*  --  6.8*  --  6.2*  MG  --   --  1.7  --   --  1.6*  PHOS  --   --   --   --  14.4* 11.7*    Liver Function Tests: Recent Labs  Lab 03/30/19 1221 03/30/19 1813  AST 30 30  ALT 37 37  ALKPHOS 137* 160*  BILITOT 0.8 0.8  PROT 5.0* 5.1*  ALBUMIN 2.3* 2.5*   Recent Labs  Lab 03/30/19 1813  LIPASE 97*   No  results for input(s): AMMONIA in the last 168 hours.  CBC: Recent Labs  Lab 03/30/19 1221 03/30/19 1813 03/31/19 0538 04/01/19 0625  WBC 15.9* 19.1* 19.6* 20.1*  NEUTROABS 11.9*  --   --   --   HGB 7.5* 7.6* 6.7* 8.7*  HCT 22.7* 22.8* 19.9* 24.4*  MCV 86.0 84.1 83.6 82.2  PLT 193 218 189 162    Cardiac Enzymes: No results for input(s): CKTOTAL, CKMB, CKMBINDEX, TROPONINI in the last 168 hours.  BNP: Invalid input(s): POCBNP  CBG: No results for input(s): GLUCAP in the last 168 hours.  Microbiology: Results for orders placed or performed during the hospital encounter of 03/30/19  Blood Culture (routine x 2)     Status: None (Preliminary result)   Collection Time: 03/30/19  7:41 PM   Specimen: BLOOD  Result Value Ref Range Status   Specimen Description BLOOD LEFT ANTECUBITAL  Final   Special Requests   Final    BOTTLES DRAWN AEROBIC AND ANAEROBIC Blood Culture adequate volume   Culture   Final    NO GROWTH 2 DAYS Performed at [facility], [address],  [place], [place] [address]    Report Status PENDING  Incomplete  Blood Culture (routine x 2)     Status: None (Preliminary result)   Collection Time: 03/30/19  7:42 PM   Specimen: BLOOD  Result Value Ref Range Status   Specimen Description BLOOD RIGHT ANTECUBITAL  Final   Special Requests   Final    BOTTLES DRAWN AEROBIC AND ANAEROBIC Blood Culture results may not be optimal due to an excessive volume of blood received in culture bottles   Culture   Final    NO GROWTH 2 DAYS Performed at [facility], [address], [place], [place] [address]    Report Status PENDING  Incomplete  SARS Coronavirus 2 [facility] order, Performed in [place] hospital lab)     Status: None   Collection Time: 03/30/19  7:42 PM  Result Value Ref Range Status   SARS Coronavirus 2 NEGATIVE NEGATIVE Final    Comment: (NOTE) If result is NEGATIVE SARS-CoV-2 target nucleic acids are NOT DETECTED. The SARS-CoV-2 RNA  is generally detectable in upper and lower  respiratory specimens during the acute phase of infection. The lowest  concentration of SARS-CoV-2 viral copies this assay can detect is 250  copies / mL. A negative result does not preclude SARS-CoV-2 infection  and should not be used as the sole basis for treatment or other  patient management decisions.  A negative result may occur with  improper specimen collection / handling, submission of specimen other  than nasopharyngeal swab, presence of viral mutation(s) within the  areas targeted by this assay, and inadequate number of viral copies  (<250 copies / mL). A negative result must be combined with clinical  observations, patient history, and epidemiological information. If result is POSITIVE SARS-CoV-2 target nucleic acids are DETECTED. The SARS-CoV-2 RNA is generally detectable in upper and lower  respiratory specimens dur ing the acute phase of infection.  Positive  results are indicative of active infection with SARS-CoV-2.  Clinical  correlation with patient history and other diagnostic information is  necessary to determine patient infection status.  Positive results do  not rule out bacterial infection or co-infection with other viruses. If result is PRESUMPTIVE POSTIVE SARS-CoV-2 nucleic acids MAY BE PRESENT.   A presumptive positive result was obtained on the submitted specimen  and confirmed on repeat testing.  While 2019 novel coronavirus  (SARS-CoV-2) nucleic acids may be present in the submitted sample  additional confirmatory testing may be necessary for epidemiological  and / or clinical management purposes  to differentiate between  SARS-CoV-2 and other Sarbecovirus currently known to infect humans.  If clinically indicated additional testing with an alternate test  methodology [phone]) is advised. The SARS-CoV-2 RNA is generally  detectable in upper and lower respiratory sp ecimens during the acute  phase of  infection. The expected result is Negative. Fact Sheet for Patients:  StrictlyIdeas.no Fact Sheet for Healthcare Providers: BankingDealers.co.za This test is not yet approved or cleared by the [place] FDA and has been authorized for detection and/or diagnosis of SARS-CoV-2 by FDA under an Emergency Use Authorization (EUA).  This EUA will remain in effect (meaning this test can be used) for the duration of the COVID-19 declaration under Section 564(b)(1) of the Act, 21 U.S.C. section 360bbb-3(b)(1), unless the authorization is terminated or revoked sooner. Performed at [facility], [address] [place], [place] [address]   Gastrointestinal Panel by PCR , Stool     Status: None   Collection Time:  03/30/19 11:36 PM   Specimen: Stool  Result Value Ref Range Status   Campylobacter species NOT DETECTED NOT DETECTED Final   Plesimonas shigelloides NOT DETECTED NOT DETECTED Final   Salmonella species NOT DETECTED NOT DETECTED Final   Yersinia enterocolitica NOT DETECTED NOT DETECTED Final   Vibrio species NOT DETECTED NOT DETECTED Final   Vibrio cholerae NOT DETECTED NOT DETECTED Final   Enteroaggregative E coli (EAEC) NOT DETECTED NOT DETECTED Final   Enteropathogenic E coli (EPEC) NOT DETECTED NOT DETECTED Final   Enterotoxigenic E coli (ETEC) NOT DETECTED NOT DETECTED Final   Shiga like toxin producing E coli (STEC) NOT DETECTED NOT DETECTED Final   Shigella/Enteroinvasive E coli (EIEC) NOT DETECTED NOT DETECTED Final   Cryptosporidium NOT DETECTED NOT DETECTED Final   Cyclospora cayetanensis NOT DETECTED NOT DETECTED Final   Entamoeba histolytica NOT DETECTED NOT DETECTED Final   Giardia lamblia NOT DETECTED NOT DETECTED Final   Adenovirus F40/41 NOT DETECTED NOT DETECTED Final   Astrovirus NOT DETECTED NOT DETECTED Final   Norovirus GI/GII NOT DETECTED NOT DETECTED Final   Rotavirus A NOT DETECTED NOT DETECTED Final    Sapovirus (I, II, IV, and V) NOT DETECTED NOT DETECTED Final    Comment: Performed at [facility], [address] [place], [place] [address]  Body fluid culture     Status: None (Preliminary result)   Collection Time: 03/31/19  1:11 PM   Specimen: Ascitic; Body Fluid  Result Value Ref Range Status   Specimen Description   Final    ASCITIC Performed at [facility], [address] [place], [place] [address]    Special Requests   Final    Immunocompromised Performed at [facility], [place], [place] [address]    Gram Stain   Final    MODERATE WBC PRESENT,BOTH PMN AND MONONUCLEAR NO ORGANISMS SEEN    Culture   Final    NO GROWTH < 24 HOURS Performed at [facility], [place] [address], [place], [place] [address]    Report Status PENDING  Incomplete    Coagulation Studies: No results for input(s): LABPROT, INR in the last 72 hours.  Urinalysis: No results for input(s): COLORURINE, LABSPEC, PHURINE, GLUCOSEU, HGBUR, BILIRUBINUR, KETONESUR, PROTEINUR, UROBILINOGEN, NITRITE, LEUKOCYTESUR in the last 72 hours.  Invalid input(s): APPERANCEUR    Imaging: Ct Abdomen Pelvis Wo Contrast  Result Date: 03/30/2019 CLINICAL DATA:  Abdominal pain EXAM: CT ABDOMEN AND PELVIS WITHOUT CONTRAST TECHNIQUE: Multidetector CT imaging of the abdomen and pelvis was performed following the standard protocol without IV contrast. COMPARISON:  CT dated 06/28/2013. FINDINGS: Lower chest: The intracardiac blood pool is hypodense relative to the adjacent myocardium consistent with anemia.The heart size is normal. The patient's dialysis catheter extends into the right atrium. Hepatobiliary: There is cirrhosis. Status post cholecystectomy.There is no biliary ductal dilation. Pancreas: Normal contours without ductal dilatation. No peripancreatic fluid collection. Spleen: The spleen is enlarged Adrenals/Urinary Tract: --Adrenal glands: No adrenal hemorrhage.  --Right kidney/ureter: There is a poorly characterized cystic appearing mass in the interpolar region of the right kidney measuring approximately 2.9 x 2 cm. This mass demonstrates some peripheral calcification. --Left kidney/ureter: There is a complex 2.6 x 1.8 cm mass in the upper pole the left kidney. This mass contains both cystic and solid-appearing components. There is a coarse calcification in the interpolar region of the left kidney. There is an exophytic cyst measuring 2.7 cm arising from the lower pole. There is an  adjacent more dense exophytic structure measuring approximately 1.5 cm. There are few small calcifications in the proximal left ureter measuring approximately 4 mm. There is no significant left-sided collecting system dilatation. --Urinary bladder: The bladder is decompressed and therefore poorly evaluated Stomach/Bowel: --Stomach/Duodenum: There is a small hiatal hernia. --Small bowel: No dilatation or inflammation. --Colon: There is diffuse wall thickening of the transverse colon and ascending colon. --Appendix: Not visualized. No right lower quadrant inflammation or free fluid. Vascular/Lymphatic: Atherosclerotic calcification is present within the non-aneurysmal abdominal aorta, without hemodynamically significant stenosis. --there is scattered prominent nonspecific retroperitoneal lymph nodes. --there are mild scattered prominent mesenteric lymph nodes. --No pelvic or inguinal lymphadenopathy. Reproductive: Patient is status post hysterectomy. There is a complex appearing 2.7 by 2.1 cm mass in the expected region of the right ovary. Other: There is a moderate volume of abdominal ascites. A peritoneal dialysis catheter is noted and is coiled in the right mid abdomen adjacent to the right hepatic lobe. The abdominal wall is normal. Musculoskeletal. No acute displaced fractures. IMPRESSION: 1. Cirrhosis with sequela of portal hypertension including moderate volume abdominal ascites and  splenomegaly. 2. Diffuse wall thickening of the transverse colon and ascending colon is concerning for infectious or inflammatory colitis. 3. Complex bilateral renal masses, not well characterized on this exam. Follow-up with a contrast-enhanced outpatient renal mass protocol CT is recommended for further evaluation as an underlying renal cell carcinoma is not excluded on this exam. 4. Complex 2.7 cm right ovarian mass. Follow-up with a nonemergent outpatient ultrasound is recommended. 5. A peritoneal dialysis catheter is noted as above. 6.  Aortic Atherosclerosis (ICD10-I70.0). Electronically Signed   By: Constance Holster M.D.   On: 03/30/2019 22:48   Dg Chest 1 View  Result Date: 03/30/2019 CLINICAL DATA:  Pneumonia EXAM: CHEST  1 VIEW COMPARISON:  March 18, 2019 FINDINGS: The tunneled dialysis catheter is well position. The right suprahilar pulmonary nodules better visualized on prior CT. There is no pneumothorax. No large pleural effusion. The heart size is stable from prior study. Aortic calcifications are noted. IMPRESSION: 1. No acute cardiopulmonary process. 2. Known right suprahilar pulmonary nodule is better visualized on prior CT. Electronically Signed   By: Constance Holster M.D.   On: 03/30/2019 20:16     Medications:   . sodium chloride Stopped (03/31/19 1340)  . 0.9 % NaCl with KCl 20 mEq / L    . dialysis solution 1.5% low-MG/low-CA    . piperacillin-tazobactam (ZOSYN)  IV 3.375 g (04/01/19 0956)   . buPROPion  100 mg Oral Daily  . Chlorhexidine Gluconate Cloth  6 each Topical Q0600  . cholecalciferol  2,000 Units Oral Daily  . FLUoxetine  40 mg Oral Daily  . gentamicin cream  1 application Topical Daily  . midodrine  10 mg Oral TID PC  . multivitamin-lutein  1 capsule Oral Daily  . pantoprazole  40 mg Oral BID  . sodium chloride flush  3 mL Intravenous Q12H  . vitamin B-12  500 mcg Oral Daily   sodium chloride, acetaminophen **OR** acetaminophen, loperamide, ondansetron  (ZOFRAN) IV, sodium chloride flush  Assessment/ Plan:  [name] is a 78 y.o. 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   [facility] Peritoneal dialysis CCPD 1.5 Liters 4 exchanges.   1.  End-stage renal disease 2. Hyponatremia 3. Metabolic acidosis 4. Anemia of chronic kidney disease status post 1 unit PRBC on 8/1 5. Secondary Hyperparathyroidism with Hypocalcemia and hyperphosphatemia 6. Hypotension  Plan Peritoneal dialysis treatment last night. With cramping.  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 No signs of peritonitis. Complication of hemodialysis access. Will have vascular surgery evaluate. Patient may need back up hemodialysis so prefer catheter exchange.   - IV NS with 55mEq for 12 hours.  - Pending peritoneal cultures. Empiric zosyn.  - Holding EPO due to malignancy.  - Midodrine for blood pressure - Start calcium acetate    LOS: 2 [name] 8/2/202011:27 AM

## 2019-04-01 NOTE — Progress Notes (Signed)
[name] at [place] NAME: Magdalyn [name]    MR#:  [id number]  DATE OF BIRTH:  [birth year]  SUBJECTIVE:  CHIEF COMPLAINT:   Chief Complaint  Patient presents with  . Abdominal Pain   Patient still reported having improvement in the diarrhea yesterday.  Did have an episode this morning.  Abdominal pain is improving.  No fevers.   Later notified by nursing staff that patient was having lots of anxiety.  Given a low dose of Xanax x1.    REVIEW OF SYSTEMS:  Review of Systems  Constitutional: Negative for chills and fever.  HENT: Negative for hearing loss and tinnitus.   Eyes: Negative for blurred vision and double vision.  Respiratory: Negative for cough and shortness of breath.   Cardiovascular: Negative for chest pain and palpitations.  Gastrointestinal: Positive for abdominal pain. Negative for heartburn, nausea and vomiting.  Genitourinary: Negative for dysuria and urgency.  Musculoskeletal: Negative for myalgias and neck pain.  Skin: Negative for itching and rash.  Neurological: Negative for dizziness and headaches.  Psychiatric/Behavioral: Negative for depression and hallucinations.    DRUG ALLERGIES:   Allergies  Allergen Reactions  . Codeine Other (See Comments)    Reaction unknown    VITALS:  Blood pressure (!) 102/55, pulse 77, temperature 98.1 F (36.7 C), temperature source Oral, resp. rate 16, height 5\' 2"  (1.575 m), weight 42.2 kg, SpO2 100 %. PHYSICAL EXAMINATION:  Physical Exam  GENERAL:  78 y.o.-year-old patient lying in the bed with no acute distress.  EYES: Pupils equal, round, reactive to light and accommodation. No scleral icterus. Extraocular muscles intact.  HEENT: Head atraumatic, normocephalic. Oropharynx and nasopharynx clear.  NECK:  Supple, no jugular venous distention. No thyroid enlargement, no tenderness.  LUNGS: Normal breath sounds bilaterally, no wheezing, rales,rhonchi or crepitation. No use of  accessory muscles of respiration.  CARDIOVASCULAR: S1, S2 normal. No murmurs, rubs, or gallops.  ABDOMEN: Soft, lower abdominal tenderness PD catheter in place, nondistended. Bowel sounds present. No rebound or guarding  EXTREMITIES: No pedal edema, cyanosis, or clubbing.  NEUROLOGIC: Cranial nerves II through XII are intact. Muscle strength 5/5 in all extremities. Sensation intact. Gait not checked.  PSYCHIATRIC: The patient is currently sleepy  sKIN: No obvious rash, lesion, or ulcer.   LABORATORY PANEL:  Female CBC Recent Labs  Lab 04/01/19 0625  WBC 20.1*  HGB 8.7*  HCT 24.4*  PLT 162   ------------------------------------------------------------------------------------------------------------------ Chemistries  Recent Labs  Lab 03/30/19 1813  04/01/19 0625  NA 124*   < > 126*  K 4.9   < > 3.6  CL 87*   < > 89*  CO2 16*   < > 19*  GLUCOSE 135*   < > 93  BUN 117*   < > 116*  CREATININE 12.10*   < > 11.26*  CALCIUM 6.2*   < > 6.2*  MG  --    < > 1.6*  AST 30  --   --   ALT 37  --   --   ALKPHOS 160*  --   --   BILITOT 0.8  --   --    < > = values in this interval not displayed.   RADIOLOGY:  No results found. ASSESSMENT AND PLAN:   Patient 78 year old white female who is presenting with abdominal pain and diarrhea  1.   Acute colitis involving the transverse colon and ascending colon Found on CT scan.  Patient presented with  abdominal pain and diarrhea, C. difficile negative.  GI panel negative. Patient started empirically on IV Zosyn.  PRN Imodium due to persistent diarrhea.  Monitor clinically Also noted incidental findings on CT scan including cirrhosis with sequelae of portal hypertension as well as complex bilateral renal masses not well visualized.  Follow-up with contrast-enhanced outpatient renal mass protocol recommended.  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  Follow-up with non-emergent outpatient ultrasound also recommended.  Prior diagnosis of  lung mass. Patient was supposed to follow-up with pulmonologist as outpatient for further evaluation to include bronchoscopy with possible biopsy.  I talked with Dr. Lillard [name] during this admission who will make arrangements for outpatient follow-up post discharge from the hospital.  2.  End-stage renal disease  Patient missed peritoneal dialysis prior to admission. Initial plan was for hemodialysis.  However patient's permacath not functioning well.  Vascular surgery consulted.  Patient had peritoneal dialysis last night.  No signs of peritonitis clinically at this time.    3.  Hyponatremia due to dehydration  Patient on gentle IV fluid hydration.  Sodium level improved to 126 from 120 on admission  4.  Hypotension continue midodrine  5.  Diabetes type 2 we will place on sliding scale insulin currently not on any medications at home  6.  Depression anxiety continue Wellbutrin and trazodone  7.    Acute on chronic anemia.   Hemoglobin down to 6.7.  Transfused with 1 unit of packed red blood cells with improvement in hemoglobin to 8.7.    DVT prophylaxis; hold off on heparin due to anemia requiring packed red blood cell transfusion. SCDs  All the records are reviewed and case discussed with Care Management/Social Worker. Management plans discussed with the patient, family and they are in agreement. I called and updated patient's daughter [name] on treatment plans as outlined above.  All questions were answered and she is in agreement with the plan of care.  CODE STATUS: Full Code  TOTAL TIME TAKING CARE OF THIS PATIENT: 35 minutes.   More than 50% of the time was spent in counseling/coordination of care: YES  POSSIBLE D/C IN 3 DAYS, DEPENDING ON CLINICAL CONDITION.   [name] M.D on 04/01/2019 at 1:11 PM  Between 7am to 6pm - Pager - [phone]  After 6pm go to [email or web address] - Proofreader  [facility]  Office  [phone]  CC:  Primary care physician; Marie Bis, MD  Note: This dictation was prepared with Dragon dictation along with smaller phrase technology. Any transcriptional errors that result from this process are unintentional.

## 2019-04-01 NOTE — Progress Notes (Signed)
Pre CCPD Assessment    04/01/19 2200  Neurological  Level of Consciousness Alert  Orientation Level Oriented X4  Respiratory  Respiratory Pattern Regular;Unlabored  Chest Assessment Chest expansion symmetrical  Bilateral Breath Sounds Diminished;Expiratory wheezes  Cough None  Cardiac  Pulse Regular  Heart Sounds No adventitious heart sounds  ECG Monitor Yes  Cardiac Rhythm NSR  Vascular  R Radial Pulse +2  L Radial Pulse +2  Generalized Edema None  Peritoneal Catheter Right lower abdomen  No Placement Date or Time found.   Catheter Location: Right lower abdomen  Site Assessment Clean;Dry;Intact  Drainage Description None  Catheter status Deaccessed;Patent  Dressing Occlusive;Gauze/Drain sponge  Dressing Status Clean;Dry;Intact  Dressing Intervention New dressing  Psychosocial  Psychosocial (WDL) WDL

## 2019-04-01 NOTE — Progress Notes (Signed)
Post CCPD TX    04/01/19 1021  Completion  Weight after Drain 94 lb 9.2 oz (42.9 kg)  Effluent Appearance Cloudy  Exit Site Care Performed Yes  Cell Count on Daytime Exchange N/A  Treatment Status Complete  Fluid Balance - CCPD  Total Intake for Exchanges (mL) 6000 ml  Total Output for Exchanges (mL) 8237 ml  Procedure Comments  Tolerated treatment well? No (comment) (Pt states she had cramping, MD aware )  Hand-Off documentation  Report given to (Full Name) Rochel Brome, RN   Report received from (Full Name) Beatris Ship, RN

## 2019-04-01 NOTE — Progress Notes (Signed)
Post CCPD Assesment   04/01/19 1024  Neurological  Level of Consciousness Alert  Orientation Level Oriented X4;Oriented to person;Oriented to place;Oriented to time;Oriented to situation  Respiratory  Respiratory Pattern Regular;Unlabored  Chest Assessment Chest expansion symmetrical  Bilateral Breath Sounds Clear;Diminished  Cough None  Cardiac  Pulse Regular  Heart Sounds S1, S2  ECG Monitor Yes  Cardiac Rhythm NSR  Vascular  R Radial Pulse +2  L Radial Pulse +2  Edema Generalized  Generalized Edema None  Peritoneal Catheter Right lower abdomen  No Placement Date or Time found.   Catheter Location: Right lower abdomen  Site Assessment Clean;Dry;Intact  Drainage Description None  Catheter status Deaccessed;Patent  Dressing Gauze/Drain sponge  Dressing Status Clean;Dry;Intact  Psychosocial  Psychosocial (WDL) WDL

## 2019-04-01 NOTE — Consult Note (Addendum)
Reason for Consult:Placement of new permacath Referring Physician: Sharol Roussel, MD  [name] is an 78 y.o. female.  EYC:XKGY is a 78 year old lady with diarrhea and abdominal pain.  She has multiple medical problems and has a partially functional PD catheter and none functioning right IJ permacatheter.  Patient complains of insomnia and has not had sleep for 3 days.  Past Medical History:  Diagnosis Date  . 10 year risk of MI or stroke 7.5% or greater   . Anemia   . Ascites   . Bursitis   . CKD (chronic kidney disease)   . Diabetes mellitus ([place])   . Esophageal varices ([place])   . ESRD (end stage renal disease) ([place]) 12/2018   to start dialysis  . FHx: migraine headaches   . Gastritis and duodenitis   . GERD (gastroesophageal reflux disease)   . Hiatal hernia   . Hypercholesterolemia   . Hypertension   . Kidney stone   . Liver cirrhosis ([place])   . Melena   . Portal hypertension ([place])   . Renal cell carcinoma ([place])   . Renal lithiasis   . Takotsubo cardiomyopathy 06/2013    Past Surgical History:  Procedure Laterality Date  . ABDOMINAL HYSTERECTOMY    . BACK SURGERY    . CHOLECYSTECTOMY    . COLONOSCOPY  06/15/2005   RMR: Minimal internal hemorrhoids. Otherwise normal rectum/Normal colon  . COLONOSCOPY    12/14/2001   RMR: Internal hemorrhage, otherwise, normal rectum/ Polyps at 30 and 35 cm resected; the remainder of colonic mucosa appeared normal  . COLONOSCOPY  05/2009   RMR: minimal hemorrhoids, sigmoid hyperplastic polyp  . COLONOSCOPY N/A 10/24/2013   JEH:[id number] colonic polyps removed as described above/Colonic diverticulosis  . ESOPHAGEAL VARICE LIGATION  12/2018   [facility]  . ESOPHAGOGASTRODUODENOSCOPY  06/12/2007   OVZ:CHYIFO esophagus/patulous EG junction, moderate sized  hiatal hernia, otherwise normal stomach, D1, D2  . ESOPHAGOGASTRODUODENOSCOPY N/A 10/24/2013   RMR: Small hiatal hernia; otherwise, normal EGD  . HIP SURGERY     right  . LEFT  HEART CATHETERIZATION WITH CORONARY ANGIOGRAM N/A 07/02/2013   Procedure: LEFT HEART CATHETERIZATION WITH CORONARY ANGIOGRAM;  Surgeon: Josue Hector, MD;  Location: [facility] CATH LAB;  Service: Cardiovascular;  Laterality: N/A;  . PARTIAL HYSTERECTOMY    . TENCKHOFF CATHETER INSERTION      Family History  Problem Relation Age of Onset  . Pancreatic cancer Father 58  . Colon cancer Mother        late 41s    Social History:  reports that she quit smoking about 16 years ago. Her smoking use included cigarettes. She started smoking about 59 years ago. She has a 43.00 pack-year smoking history. She has never used smokeless tobacco. She reports that she does not drink alcohol or use drugs.  Allergies:  Allergies  Allergen Reactions  . Codeine Other (See Comments)    Reaction unknown     Medications: I have reviewed the patient's current medications.  Results for orders placed or performed during the hospital encounter of 03/30/19 (from the past 48 hour(s))  Lipase, blood     Status: Abnormal   Collection Time: 03/30/19  6:13 PM  Result Value Ref Range   Lipase 97 (H) 11 - 51 U/L    Comment: Performed at [facility], [address], [place], [place] [address]  Comprehensive metabolic panel     Status: Abnormal   Collection Time: 03/30/19  6:13 PM  Result  Value Ref Range   Sodium 124 (L) 135 - 145 mmol/L    Comment: LYTES REPEATED  MLK   Potassium 4.9 3.5 - 5.1 mmol/L   Chloride 87 (L) 98 - 111 mmol/L   CO2 16 (L) 22 - 32 mmol/L   Glucose, Bld 135 (H) 70 - 99 mg/dL   BUN 117 (H) 8 - 23 mg/dL   Creatinine, Ser 12.10 (H) 0.44 - 1.00 mg/dL   Calcium 6.2 (LL) 8.9 - 10.3 mg/dL    Comment: CRITICAL RESULT CALLED TO, READ BACK BY AND VERIFIED WITH ANGELA ROBBINS 03/30/19 @ 1851  MLK    Total Protein 5.1 (L) 6.5 - 8.1 g/dL   Albumin 2.5 (L) 3.5 - 5.0 g/dL   AST 30 15 - 41 U/L   ALT 37 0 - 44 U/L   Alkaline Phosphatase 160 (H) 38 - 126 U/L   Total Bilirubin 0.8 0.3 - 1.2 mg/dL    GFR calc non Af Amer 3 (L) >60 mL/min   GFR calc Af Amer 3 (L) >60 mL/min   Anion gap 21 (H) 5 - 15    Comment: Performed at [facility], [address] [place], [place] [address]  CBC     Status: Abnormal   Collection Time: 03/30/19  6:13 PM  Result Value Ref Range   WBC 19.1 (H) 4.0 - 10.5 K/uL   RBC 2.71 (L) 3.87 - 5.11 MIL/uL   Hemoglobin 7.6 (L) 12.0 - 15.0 g/dL   HCT 22.8 (L) 36.0 - 46.0 %   MCV 84.1 80.0 - 100.0 fL   MCH 28.0 26.0 - 34.0 pg   MCHC 33.3 30.0 - 36.0 g/dL   RDW 15.6 (H) 11.5 - 15.5 %   Platelets 218 150 - 400 K/uL   nRBC 0.0 0.0 - 0.2 %    Comment: Performed at [facility], [address] [place], [place] [address]  Lactic acid, plasma     Status: None   Collection Time: 03/30/19  6:13 PM  Result Value Ref Range   Lactic Acid, Venous 1.6 0.5 - 1.9 mmol/L    Comment: Performed at [facility], [address] [place], [place] [address]  Procalcitonin     Status: None   Collection Time: 03/30/19  6:13 PM  Result Value Ref Range   Procalcitonin 3.57 ng/mL    Comment:        Interpretation: PCT > 2 ng/mL: Systemic infection (sepsis) is likely, unless other causes are known. (NOTE)       Sepsis PCT Algorithm           Lower Respiratory Tract                                      Infection PCT Algorithm    ----------------------------     ----------------------------         PCT < 0.25 ng/mL                PCT < 0.10 ng/mL         Strongly encourage             Strongly discourage   discontinuation of antibiotics    initiation of antibiotics    ----------------------------     -----------------------------       PCT 0.25 - 0.50 ng/mL            PCT  0.10 - 0.25 ng/mL               OR       >80% decrease in PCT            Discourage initiation of                                            antibiotics      Encourage discontinuation           of antibiotics    ----------------------------     -----------------------------          PCT >= 0.50 ng/mL              PCT 0.26 - 0.50 ng/mL               AND       <80% decrease in PCT              Encourage initiation of                                             antibiotics       Encourage continuation           of antibiotics    ----------------------------     -----------------------------        PCT >= 0.50 ng/mL                  PCT > 0.50 ng/mL               AND         increase in PCT                  Strongly encourage                                      initiation of antibiotics    Strongly encourage escalation           of antibiotics                                     -----------------------------                                           PCT <= 0.25 ng/mL                                                 OR                                        > 80% decrease in PCT  Discontinue / Do not initiate                                             antibiotics Performed at [facility], [address] [place], [place] [address]   Blood Culture (routine x 2)     Status: None (Preliminary result)   Collection Time: 03/30/19  7:41 PM   Specimen: BLOOD  Result Value Ref Range   Specimen Description BLOOD LEFT ANTECUBITAL    Special Requests      BOTTLES DRAWN AEROBIC AND ANAEROBIC Blood Culture adequate volume   Culture      NO GROWTH 2 DAYS Performed at [facility], [address] [place], [place] [address]    Report Status PENDING   Blood Culture (routine x 2)     Status: None (Preliminary result)   Collection Time: 03/30/19  7:42 PM   Specimen: BLOOD  Result Value Ref Range   Specimen Description BLOOD RIGHT ANTECUBITAL    Special Requests      BOTTLES DRAWN AEROBIC AND ANAEROBIC Blood Culture results may not be optimal due to an excessive volume of blood received in culture bottles   Culture      NO GROWTH 2 DAYS Performed at [facility], [address] [place],   [address]    Report Status PENDING   SARS Coronavirus 2 [place] order, Performed in [facility] hospital lab)     Status: None   Collection Time: 03/30/19  7:42 PM  Result Value Ref Range   SARS Coronavirus 2 NEGATIVE NEGATIVE    Comment: (NOTE) If result is NEGATIVE SARS-CoV-2 target nucleic acids are NOT DETECTED. The SARS-CoV-2 RNA is generally detectable in upper and lower  respiratory specimens during the acute phase of infection. The lowest  concentration of SARS-CoV-2 viral copies this assay can detect is 250  copies / mL. A negative result does not preclude SARS-CoV-2 infection  and should not be used as the sole basis for treatment or other  patient management decisions.  A negative result may occur with  improper specimen collection / handling, submission of specimen other  than nasopharyngeal swab, presence of viral mutation(s) within the  areas targeted by this assay, and inadequate number of viral copies  (<250 copies / mL). A negative result must be combined with clinical  observations, patient history, and epidemiological information. If result is POSITIVE SARS-CoV-2 target nucleic acids are DETECTED. The SARS-CoV-2 RNA is generally detectable in upper and lower  respiratory specimens dur ing the acute phase of infection.  Positive  results are indicative of active infection with SARS-CoV-2.  Clinical  correlation with patient history and other diagnostic information is  necessary to determine patient infection status.  Positive results do  not rule out bacterial infection or co-infection with other viruses. If result is PRESUMPTIVE POSTIVE SARS-CoV-2 nucleic acids MAY BE PRESENT.   A presumptive positive result was obtained on the submitted specimen  and confirmed on repeat testing.  While 2019 novel coronavirus  (SARS-CoV-2) nucleic acids may be present in the submitted sample  additional confirmatory testing may be necessary for epidemiological  and / or clinical  management purposes  to differentiate between  SARS-CoV-2 and other Sarbecovirus currently known to infect humans.  If clinically indicated additional testing with an alternate test  methodology [phone]) is advised. The SARS-CoV-2 RNA is  generally  detectable in upper and lower respiratory sp ecimens during the acute  phase of infection. The expected result is Negative. Fact Sheet for Patients:  StrictlyIdeas.no Fact Sheet for Healthcare Providers: BankingDealers.co.za This test is not yet approved or cleared by the [place] FDA and has been authorized for detection and/or diagnosis of SARS-CoV-2 by FDA under an Emergency Use Authorization (EUA).  This EUA will remain in effect (meaning this test can be used) for the duration of the COVID-19 declaration under Section 564(b)(1) of the Act, 21 U.S.C. section 360bbb-3(b)(1), unless the authorization is terminated or revoked sooner. Performed at [facility], [address] [place], [place] [address]   Lactic acid, plasma     Status: None   Collection Time: 03/30/19 11:27 PM  Result Value Ref Range   Lactic Acid, Venous 1.5 0.5 - 1.9 mmol/L    Comment: Performed at [facility], [address] [place], [place] [address]  Magnesium     Status: None   Collection Time: 03/30/19 11:27 PM  Result Value Ref Range   Magnesium 1.7 1.7 - 2.4 mg/dL    Comment: Performed at [facility], [address] [place], [place] [address]  Gastrointestinal Panel by PCR , Stool     Status: None   Collection Time: 03/30/19 11:36 PM   Specimen: Stool  Result Value Ref Range   Campylobacter species NOT DETECTED NOT DETECTED   Plesimonas shigelloides NOT DETECTED NOT DETECTED   Salmonella species NOT DETECTED NOT DETECTED   Yersinia enterocolitica NOT DETECTED NOT DETECTED   Vibrio species NOT DETECTED NOT DETECTED   Vibrio cholerae NOT DETECTED NOT DETECTED    Enteroaggregative E coli (EAEC) NOT DETECTED NOT DETECTED   Enteropathogenic E coli (EPEC) NOT DETECTED NOT DETECTED   Enterotoxigenic E coli (ETEC) NOT DETECTED NOT DETECTED   Shiga like toxin producing E coli (STEC) NOT DETECTED NOT DETECTED   Shigella/Enteroinvasive E coli (EIEC) NOT DETECTED NOT DETECTED   Cryptosporidium NOT DETECTED NOT DETECTED   Cyclospora cayetanensis NOT DETECTED NOT DETECTED   Entamoeba histolytica NOT DETECTED NOT DETECTED   Giardia lamblia NOT DETECTED NOT DETECTED   Adenovirus F40/41 NOT DETECTED NOT DETECTED   Astrovirus NOT DETECTED NOT DETECTED   Norovirus GI/GII NOT DETECTED NOT DETECTED   Rotavirus A NOT DETECTED NOT DETECTED   Sapovirus (I, II, IV, and V) NOT DETECTED NOT DETECTED    Comment: Performed at [facility], [address] [place], [place] [address]  CBC     Status: Abnormal   Collection Time: 03/31/19  5:38 AM  Result Value Ref Range   WBC 19.6 (H) 4.0 - 10.5 K/uL   RBC 2.38 (L) 3.87 - 5.11 MIL/uL   Hemoglobin 6.7 (L) 12.0 - 15.0 g/dL   HCT 19.9 (L) 36.0 - 46.0 %   MCV 83.6 80.0 - 100.0 fL   MCH 28.2 26.0 - 34.0 pg   MCHC 33.7 30.0 - 36.0 g/dL   RDW 15.7 (H) 11.5 - 15.5 %   Platelets 189 150 - 400 K/uL   nRBC 0.0 0.0 - 0.2 %    Comment: Performed at [facility], [address] [place],  [address]  Basic metabolic panel     Status: Abnormal   Collection Time: 03/31/19  5:38 AM  Result Value Ref Range   Sodium 124 (L) 135 - 145 mmol/L    Comment: RESULTS VERIFIED BY REPEAT TESTING/HKP   Potassium 4.7 3.5 - 5.1 mmol/L  Chloride 89 (L) 98 - 111 mmol/L   CO2 14 (L) 22 - 32 mmol/L   Glucose, Bld 87 70 - 99 mg/dL   BUN 129 (H) 8 - 23 mg/dL    Comment: RESULT CONFIRMED BY MANUAL DILUTION/HKP   Creatinine, Ser 12.32 (H) 0.44 - 1.00 mg/dL   Calcium 6.8 (L) 8.9 - 10.3 mg/dL   GFR calc non Af Amer 3 (L) >60 mL/min   GFR calc Af Amer 3 (L) >60 mL/min   Anion gap 21 (H) 5 - 15    Comment: Performed at  [facility], [address] [place], [place] [address]  Prepare RBC     Status: None   Collection Time: 03/31/19 10:20 AM  Result Value Ref Range   Order Confirmation      ORDER PROCESSED BY BLOOD BANK Performed at [facility], [address] [place], [place] [address]   Type and screen [place]     Status: None (Preliminary result)   Collection Time: 03/31/19 10:36 AM  Result Value Ref Range   ABO/RH(D) A POS    Antibody Screen POS    Sample Expiration 04/03/2019,2359    Antibody Identification NON SPECIFIC ANTIBODY REACTIVITY    Unit Number [id number]    Blood Component Type RED CELLS,LR    Unit division 00    Status of Unit ALLOCATED    Transfusion Status OK TO TRANSFUSE    Crossmatch Result COMPATIBLE    Unit Number [id number]    Blood Component Type RED CELLS,LR    Unit division 00    Status of Unit ISSUED,FINAL    Transfusion Status OK TO TRANSFUSE    Crossmatch Result COMPATIBLE    Unit Number [id number]    Blood Component Type RED CELLS,LR    Unit division 00    Status of Unit ALLOCATED    Transfusion Status OK TO TRANSFUSE    Crossmatch Result COMPATIBLE   Phosphorus     Status: Abnormal   Collection Time: 03/31/19 10:43 AM  Result Value Ref Range   Phosphorus 14.4 (H) 2.5 - 4.6 mg/dL    Comment: RESULT CONFIRMED BY MANUAL DILUTION Performed at [facility], [address] [place], [place] [address]   Body fluid cell count with differential     Status: Abnormal   Collection Time: 03/31/19  1:11 PM  Result Value Ref Range   Fluid Type-FCT Peritoneal    Color, Fluid COLORLESS (A) YELLOW   Appearance, Fluid CLEAR CLEAR   WBC, Fluid 120 cu mm   Neutrophil Count, Fluid 22 %   Lymphs, Fluid 30 %   Monocyte-Macrophage-Serous Fluid 48 %   Eos, Fluid 0 %    Comment: Performed at [facility], [address] [place], [place] [address]  Body fluid culture     Status: None (Preliminary  result)   Collection Time: 03/31/19  1:11 PM   Specimen: Ascitic; Body Fluid  Result Value Ref Range   Specimen Description      ASCITIC Performed at [facility], [address] [place], [place] [address]    Special Requests      Immunocompromised Performed at [facility], [place], [place] [address]    Gram Stain      MODERATE WBC PRESENT,BOTH PMN AND MONONUCLEAR NO ORGANISMS SEEN    Culture      NO GROWTH < 24 HOURS Performed at [facility], [place],  [place] [address]    Report Status PENDING   Basic metabolic panel     Status: Abnormal   Collection Time: 04/01/19  6:25 AM  Result Value Ref Range   Sodium 126 (L) 135 - 145 mmol/L   Potassium 3.6 3.5 - 5.1 mmol/L   Chloride 89 (L) 98 - 111 mmol/L   CO2 19 (L) 22 - 32 mmol/L   Glucose, Bld 93 70 - 99 mg/dL   BUN 116 (H) 8 - 23 mg/dL    Comment: RESULT CONFIRMED BY MANUAL DILUTION AKT   Creatinine, Ser 11.26 (H) 0.44 - 1.00 mg/dL   Calcium 6.2 (LL) 8.9 - 10.3 mg/dL    Comment: CRITICAL RESULT CALLED TO, READ BACK BY AND VERIFIED WITH ZARA JAMA @0813  04/01/19 AKT   GFR calc non Af Amer 3 (L) >60 mL/min   GFR calc Af Amer 3 (L) >60 mL/min   Anion gap 18 (H) 5 - 15    Comment: Performed at [facility], [address] [place], [place] [address]  CBC     Status: Abnormal   Collection Time: 04/01/19  6:25 AM  Result Value Ref Range   WBC 20.1 (H) 4.0 - 10.5 K/uL   RBC 2.97 (L) 3.87 - 5.11 MIL/uL   Hemoglobin 8.7 (L) 12.0 - 15.0 g/dL   HCT 24.4 (L) 36.0 - 46.0 %   MCV 82.2 80.0 - 100.0 fL   MCH 29.3 26.0 - 34.0 pg   MCHC 35.7 30.0 - 36.0 g/dL   RDW 15.0 11.5 - 15.5 %   Platelets 162 150 - 400 K/uL   nRBC 0.0 0.0 - 0.2 %    Comment: Performed at [facility], [address] [place], [place] [address]  Magnesium     Status: Abnormal   Collection Time: 04/01/19  6:25 AM  Result Value Ref Range   Magnesium 1.6 (L) 1.7 - 2.4 mg/dL    Comment:  Performed at [facility], [address] [place], [place] [address]  Phosphorus     Status: Abnormal   Collection Time: 04/01/19  6:25 AM  Result Value Ref Range   Phosphorus 11.7 (H) 2.5 - 4.6 mg/dL    Comment: Performed at [facility], [address] [place], [place] [address]    Ct Abdomen Pelvis Wo Contrast  Result Date: 03/30/2019 CLINICAL DATA:  Abdominal pain EXAM: CT ABDOMEN AND PELVIS WITHOUT CONTRAST TECHNIQUE: Multidetector CT imaging of the abdomen and pelvis was performed following the standard protocol without IV contrast. COMPARISON:  CT dated 06/28/2013. FINDINGS: Lower chest: The intracardiac blood pool is hypodense relative to the adjacent myocardium consistent with anemia.The heart size is normal. The patient's dialysis catheter extends into the right atrium. Hepatobiliary: There is cirrhosis. Status post cholecystectomy.There is no biliary ductal dilation. Pancreas: Normal contours without ductal dilatation. No peripancreatic fluid collection. Spleen: The spleen is enlarged Adrenals/Urinary Tract: --Adrenal glands: No adrenal hemorrhage. --Right kidney/ureter: There is a poorly characterized cystic appearing mass in the interpolar region of the right kidney measuring approximately 2.9 x 2 cm. This mass demonstrates some peripheral calcification. --Left kidney/ureter: There is a complex 2.6 x 1.8 cm mass in the upper pole the left kidney. This mass contains both cystic and solid-appearing components. There is a coarse calcification in the interpolar region of the left kidney. There is an exophytic cyst measuring 2.7 cm arising from the lower pole. There is an adjacent more dense exophytic structure measuring approximately 1.5 cm. There are  few small calcifications in the proximal left ureter measuring approximately 4 mm. There is no significant left-sided collecting system dilatation. --Urinary bladder: The bladder is decompressed and therefore poorly evaluated  Stomach/Bowel: --Stomach/Duodenum: There is a small hiatal hernia. --Small bowel: No dilatation or inflammation. --Colon: There is diffuse wall thickening of the transverse colon and ascending colon. --Appendix: Not visualized. No right lower quadrant inflammation or free fluid. Vascular/Lymphatic: Atherosclerotic calcification is present within the non-aneurysmal abdominal aorta, without hemodynamically significant stenosis. --there is scattered prominent nonspecific retroperitoneal lymph nodes. --there are mild scattered prominent mesenteric lymph nodes. --No pelvic or inguinal lymphadenopathy. Reproductive: Patient is status post hysterectomy. There is a complex appearing 2.7 by 2.1 cm mass in the expected region of the right ovary. Other: There is a moderate volume of abdominal ascites. A peritoneal dialysis catheter is noted and is coiled in the right mid abdomen adjacent to the right hepatic lobe. The abdominal wall is normal. Musculoskeletal. No acute displaced fractures. IMPRESSION: 1. Cirrhosis with sequela of portal hypertension including moderate volume abdominal ascites and splenomegaly. 2. Diffuse wall thickening of the transverse colon and ascending colon is concerning for infectious or inflammatory colitis. 3. Complex bilateral renal masses, not well characterized on this exam. Follow-up with a contrast-enhanced outpatient renal mass protocol CT is recommended for further evaluation as an underlying renal cell carcinoma is not excluded on this exam. 4. Complex 2.7 cm right ovarian mass. Follow-up with a nonemergent outpatient ultrasound is recommended. 5. A peritoneal dialysis catheter is noted as above. 6.  Aortic Atherosclerosis (ICD10-I70.0). Electronically Signed   By: Constance Holster M.D.   On: 03/30/2019 22:48   Dg Chest 1 View  Result Date: 03/30/2019 CLINICAL DATA:  Pneumonia EXAM: CHEST  1 VIEW COMPARISON:  March 18, 2019 FINDINGS: The tunneled dialysis catheter is well position. The  right suprahilar pulmonary nodules better visualized on prior CT. There is no pneumothorax. No large pleural effusion. The heart size is stable from prior study. Aortic calcifications are noted. IMPRESSION: 1. No acute cardiopulmonary process. 2. Known right suprahilar pulmonary nodule is better visualized on prior CT. Electronically Signed   By: Constance Holster M.D.   On: 03/30/2019 20:16    Review of Systems  All other systems reviewed and are negative.  Blood pressure (!) 102/55, pulse 77, temperature 98.1 F (36.7 C), temperature source Oral, resp. rate 16, height 5\' 2"  (1.575 m), weight 42.2 kg, SpO2 100 %. Physical Exam  Nursing note and vitals reviewed. Constitutional: She is oriented to person, place, and time. She appears distressed.  HENT:  Head: Normocephalic and atraumatic.  Eyes: Pupils are equal, round, and reactive to light. Conjunctivae are normal.  Neck: Normal range of motion. Neck supple.  Cardiovascular: Regular rhythm and normal heart sounds.  Respiratory: Effort normal and breath sounds normal.  GI: Soft. Bowel sounds are normal.  Musculoskeletal: Normal range of motion.  Neurological: She is alert and oriented to person, place, and time. She has normal reflexes.  Skin: Skin is warm.  Psychiatric: She has a normal mood and affect. Her behavior is normal. Thought content normal.    Assessment/Plan: This is a 78 year old lady with ESRD in need of new dialysis access, diarrhea, chronic, anemia,abdominal discomfort, and insomnia.  Patient will be NPO after midnight in preparation for new permacath placement for dialysis.  She may require removal of PD catheter and prior permacath in the right IJ.  Elmore Guise 04/01/2019, 1:44 PM

## 2019-04-02 ENCOUNTER — Encounter: Payer: Self-pay | Admitting: Vascular Surgery

## 2019-04-02 ENCOUNTER — Encounter
Admission: EM | Disposition: A | Discharge status: Home Health | Payer: Self-pay | Source: Home / Self Care | Attending: Internal Medicine

## 2019-04-02 ENCOUNTER — Other Ambulatory Visit (INDEPENDENT_AMBULATORY_CARE_PROVIDER_SITE_OTHER): Payer: Self-pay | Admitting: Vascular Surgery

## 2019-04-02 DIAGNOSIS — Z992 Dependence on renal dialysis: Secondary | ICD-10-CM | POA: Diagnosis not present

## 2019-04-02 DIAGNOSIS — N186 End stage renal disease: Secondary | ICD-10-CM | POA: Diagnosis not present

## 2019-04-02 DIAGNOSIS — I4581 Long QT syndrome: Secondary | ICD-10-CM

## 2019-04-02 HISTORY — PX: DIALYSIS/PERMA CATHETER INSERTION: CATH118288

## 2019-04-02 LAB — CBC
HCT: 23.8 % — ABNORMAL LOW (ref 36.0–46.0)
Hemoglobin: 8.2 g/dL — ABNORMAL LOW (ref 12.0–15.0)
MCH: 28.8 pg (ref 26.0–34.0)
MCHC: 34.5 g/dL (ref 30.0–36.0)
MCV: 83.5 fL (ref 80.0–100.0)
Platelets: 109 10*3/uL — ABNORMAL LOW (ref 150–400)
RBC: 2.85 MIL/uL — ABNORMAL LOW (ref 3.87–5.11)
RDW: 15 % (ref 11.5–15.5)
WBC: 13.2 10*3/uL — ABNORMAL HIGH (ref 4.0–10.5)
nRBC: 0 % (ref 0.0–0.2)

## 2019-04-02 LAB — BASIC METABOLIC PANEL
Anion gap: 19 — ABNORMAL HIGH (ref 5–15)
BUN: 106 mg/dL — ABNORMAL HIGH (ref 8–23)
CO2: 16 mmol/L — ABNORMAL LOW (ref 22–32)
Calcium: 6 mg/dL — CL (ref 8.9–10.3)
Chloride: 89 mmol/L — ABNORMAL LOW (ref 98–111)
Creatinine, Ser: 10.61 mg/dL — ABNORMAL HIGH (ref 0.44–1.00)
GFR calc Af Amer: 4 mL/min — ABNORMAL LOW (ref >60)
GFR calc non Af Amer: 3 mL/min — ABNORMAL LOW (ref >60)
Glucose, Bld: 154 mg/dL — ABNORMAL HIGH (ref 70–99)
Potassium: 3.3 mmol/L — ABNORMAL LOW (ref 3.5–5.1)
Sodium: 124 mmol/L — ABNORMAL LOW (ref 135–145)

## 2019-04-02 LAB — GLUCOSE, CAPILLARY
Glucose-Capillary: 107 mg/dL — ABNORMAL HIGH (ref 70–99)
Glucose-Capillary: 122 mg/dL — ABNORMAL HIGH (ref 70–99)

## 2019-04-02 LAB — PATHOLOGIST SMEAR REVIEW

## 2019-04-02 LAB — PHOSPHORUS: Phosphorus: 10.4 mg/dL — ABNORMAL HIGH (ref 2.5–4.6)

## 2019-04-02 LAB — MAGNESIUM: Magnesium: 1.5 mg/dL — ABNORMAL LOW (ref 1.7–2.4)

## 2019-04-02 SURGERY — EXCHANGE OF A DIALYSIS CATHETER
Anesthesia: Choice

## 2019-04-02 SURGERY — DIALYSIS/PERMA CATHETER INSERTION
Anesthesia: Moderate Sedation

## 2019-04-02 MED ORDER — EPOETIN ALFA 10000 UNIT/ML IJ SOLN
10000.0000 [IU] | Freq: Once | INTRAMUSCULAR | Status: AC
  Administered 2019-04-02: 10000 [IU] via INTRAVENOUS
  Filled 2019-04-02: qty 1

## 2019-04-02 MED ORDER — SODIUM CHLORIDE 0.9 % IV SOLN
INTRAVENOUS | Status: DC
  Administered 2019-04-02: 13:00:00 via INTRAVENOUS

## 2019-04-02 MED ORDER — DIPHENHYDRAMINE HCL 50 MG/ML IJ SOLN: 50.0000 mg | Freq: Once | INTRAMUSCULAR | Status: DC | PRN

## 2019-04-02 MED ORDER — CEFAZOLIN SODIUM-DEXTROSE 1-4 GM/50ML-% IV SOLN
1.0000 g | Freq: Once | INTRAVENOUS | Status: AC
  Administered 2019-04-02: 14:00:00 1 g via INTRAVENOUS

## 2019-04-02 MED ORDER — MIDAZOLAM HCL 2 MG/ML PO SYRP: 8.0000 mg | ORAL_SOLUTION | Freq: Once | ORAL | Status: DC | PRN

## 2019-04-02 MED ORDER — FENTANYL CITRATE (PF) 100 MCG/2ML IJ SOLN
INTRAMUSCULAR | Status: AC
  Filled 2019-04-02: qty 2

## 2019-04-02 MED ORDER — ONDANSETRON HCL 4 MG/2ML IJ SOLN: 4.0000 mg | Freq: Four times a day (QID) | INTRAMUSCULAR | Status: DC | PRN

## 2019-04-02 MED ORDER — FENTANYL CITRATE (PF) 100 MCG/2ML IJ SOLN
INTRAMUSCULAR | Status: DC | PRN
  Administered 2019-04-02: 50 ug via INTRAVENOUS

## 2019-04-02 MED ORDER — CEFAZOLIN SODIUM-DEXTROSE 1-4 GM/50ML-% IV SOLN
INTRAVENOUS | Status: AC
  Administered 2019-04-02: 14:00:00 1 g via INTRAVENOUS
  Filled 2019-04-02: qty 50

## 2019-04-02 MED ORDER — MIDAZOLAM HCL 2 MG/2ML IJ SOLN
INTRAMUSCULAR | Status: DC | PRN
  Administered 2019-04-02: 1 mg via INTRAVENOUS

## 2019-04-02 MED ORDER — SODIUM CHLORIDE 0.9 % IV SOLN: INTRAVENOUS | Status: DC

## 2019-04-02 MED ORDER — FAMOTIDINE 20 MG PO TABS: 40.0000 mg | ORAL_TABLET | Freq: Once | ORAL | Status: DC | PRN

## 2019-04-02 MED ORDER — HYDROMORPHONE HCL 1 MG/ML IJ SOLN: 1.0000 mg | Freq: Once | INTRAMUSCULAR | Status: DC | PRN

## 2019-04-02 MED ORDER — MIDAZOLAM HCL 5 MG/5ML IJ SOLN
INTRAMUSCULAR | Status: AC
  Filled 2019-04-02: qty 5

## 2019-04-02 MED ORDER — POTASSIUM CHLORIDE IN NACL 20-0.9 MEQ/L-% IV SOLN
INTRAVENOUS | Status: DC
  Administered 2019-04-02 – 2019-04-06 (×8): via INTRAVENOUS
  Filled 2019-04-02 (×12): qty 1000

## 2019-04-02 MED ORDER — CALCIUM CARBONATE-VITAMIN D 500-200 MG-UNIT PO TABS
1.0000 | ORAL_TABLET | Freq: Three times a day (TID) | ORAL | Status: DC
  Administered 2019-04-02 – 2019-04-06 (×11): 1 via ORAL
  Filled 2019-04-02 (×11): qty 1

## 2019-04-02 MED ORDER — METHYLPREDNISOLONE SODIUM SUCC 125 MG IJ SOLR: 125.0000 mg | Freq: Once | INTRAMUSCULAR | Status: DC | PRN

## 2019-04-02 SURGICAL SUPPLY — 10 items
ADH SKN CLS APL DERMABOND .7 (GAUZE/BANDAGES/DRESSINGS) ×1
BIOPATCH RED 1 DISK 7.0 (GAUZE/BANDAGES/DRESSINGS) ×1 IMPLANT
CATH PALINDROME-P 19CM W/VT (CATHETERS) ×1 IMPLANT
DERMABOND ADVANCED (GAUZE/BANDAGES/DRESSINGS) ×1
DERMABOND ADVANCED .7 DNX12 (GAUZE/BANDAGES/DRESSINGS) IMPLANT
GUIDEWIRE SUPER STIFF .035X180 (WIRE) ×1 IMPLANT
PACK ANGIOGRAPHY (CUSTOM PROCEDURE TRAY) ×1 IMPLANT
SUT MNCRL AB 4-0 PS2 18 (SUTURE) ×1 IMPLANT
SUT PROLENE 0 CT 1 30 (SUTURE) ×1 IMPLANT
TOWEL OR 17X26 4PK STRL BLUE (TOWEL DISPOSABLE) ×1 IMPLANT

## 2019-04-02 NOTE — Progress Notes (Signed)
[facility] [place] NAME: [name]    MR#:  [id number]  DATE OF BIRTH:  1941-07-01  SUBJECTIVE:  CHIEF COMPLAINT:   Chief Complaint  Patient presents with  . Abdominal Pain   No new complaint this morning.  Diarrhea improved.  No fevers overnight.  Patient scheduled for permacath placement today.  REVIEW OF SYSTEMS:  Review of Systems  Constitutional: Negative for chills and fever.  HENT: Negative for hearing loss and tinnitus.   Eyes: Negative for blurred vision and double vision.  Respiratory: Negative for cough and shortness of breath.   Cardiovascular: Negative for chest pain and palpitations.  Gastrointestinal: Negative for abdominal pain, heartburn, nausea and vomiting.  Genitourinary: Negative for dysuria and urgency.  Musculoskeletal: Negative for myalgias and neck pain.  Skin: Negative for itching and rash.  Neurological: Negative for dizziness and headaches.  Psychiatric/Behavioral: Negative for depression and hallucinations.    DRUG ALLERGIES:   Allergies  Allergen Reactions  . Codeine Other (See Comments)    Reaction unknown    VITALS:  Blood pressure (!) 91/50, pulse 84, temperature 98.6 F (37 C), temperature source Oral, resp. rate 13, height 5\' 2"  (1.575 m), weight 40.3 kg, SpO2 99 %. PHYSICAL EXAMINATION:  Physical Exam  GENERAL:  78 y.o.-year-old patient lying in the bed with no acute distress.  EYES: Pupils equal, round, reactive to light and accommodation. No scleral icterus. Extraocular muscles intact.  HEENT: Head atraumatic, normocephalic. Oropharynx and nasopharynx clear.  NECK:  Supple, no jugular venous distention. No thyroid enlargement, no tenderness.  LUNGS: Normal breath sounds bilaterally, no wheezing, rales,rhonchi or crepitation. No use of accessory muscles of respiration.  CARDIOVASCULAR: S1, S2 normal. No murmurs, rubs, or gallops.  ABDOMEN: Soft, lower abdominal tenderness PD  catheter in place, nondistended. Bowel sounds present. No rebound or guarding  EXTREMITIES: No pedal edema, cyanosis, or clubbing.  NEUROLOGIC: Cranial nerves II through XII are intact. Muscle strength 5/5 in all extremities. Sensation intact. Gait not checked.  PSYCHIATRIC: The patient is currently sleepy  sKIN: No obvious rash, lesion, or ulcer.   LABORATORY PANEL:  Female CBC Recent Labs  Lab 04/02/19 0451  WBC 13.2*  HGB 8.2*  HCT 23.8*  PLT 109*   ------------------------------------------------------------------------------------------------------------------ Chemistries  Recent Labs  Lab 03/30/19 1813  04/02/19 0451  NA 124*   < > 124*  K 4.9   < > 3.3*  CL 87*   < > 89*  CO2 16*   < > 16*  GLUCOSE 135*   < > 154*  BUN 117*   < > 106*  CREATININE 12.10*   < > 10.61*  CALCIUM 6.2*   < > 6.0*  MG  --    < > 1.5*  AST 30  --   --   ALT 37  --   --   ALKPHOS 160*  --   --   BILITOT 0.8  --   --    < > = values in this interval not displayed.   RADIOLOGY:  No results found. ASSESSMENT AND PLAN:   Patient 78 year old white female who is presenting with abdominal pain and diarrhea  1.   Acute colitis involving the transverse colon and ascending colon Found on CT scan.  Patient presented with abdominal pain and diarrhea, C. difficile negative.  GI panel negative. Patient started empirically on IV Zosyn.  PRN Imodium due to persistent diarrhea.  Monitor clinically Also noted incidental findings  on CT scan including cirrhosis with sequelae of portal hypertension as well as complex bilateral renal masses not well visualized.  Follow-up with contrast-enhanced outpatient renal mass protocol recommended.  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  Follow-up with non-emergent outpatient ultrasound also recommended. Leukocytosis improving.  Prior diagnosis of lung mass. Patient was supposed to follow-up with pulmonologist as outpatient for further evaluation to include  bronchoscopy with possible biopsy.  I talked with Dr. Lillard Anes during this admission who will make arrangements for outpatient follow-up post discharge from the hospital.  2.  End-stage renal disease  Patient missed peritoneal dialysis prior to admission. Initial plan was for hemodialysis.  However patient's permacath not functioning well.  Vascular surgery consulted and patient having permacath placed today..  Patient had peritoneal dialysis last night.  No signs of peritonitis clinically at this time.    3.  Hyponatremia due to dehydration  Patient on gentle IV fluid hydration.  Sodium level of 124 this morning.  Expect further correction during hemodialysis later today after permacath placement   4.  Hypotension continue midodrine. Increased rate of IV fluids this morning.  Monitor  5.  Diabetes type 2 we will place on sliding scale insulin currently not on any medications at home  6.  Depression anxiety continue Wellbutrin and trazodone  7.    Acute on chronic anemia.   Hemoglobin down to 6.7.  Transfused with 1 unit of packed red blood cells with improvement in hemoglobin to 8.2.    8.  Thrombocytopenia Platelet count down to 109. Patient on Zosyn.  If further drop in platelet count in a.m. will change antibiotics.  DVT prophylaxis; hold off on heparin due to anemia requiring packed red blood cell transfusion. SCDs  All the records are reviewed and case discussed with Care Management/Social Worker. Management plans discussed with the patient, family and they are in agreement. I called and updated patient's daughter [name] on treatment plans as outlined above.  All questions were answered and she is in agreement with the plan of care.  CODE STATUS: Full Code  TOTAL TIME TAKING CARE OF THIS PATIENT: 33 minutes.   More than 50% of the time was spent in counseling/coordination of care: YES  POSSIBLE D/C IN 3 DAYS, DEPENDING ON CLINICAL CONDITION.   Maniah Nading M.D on  04/02/2019 at 2:14 PM  Between 7am to 6pm - Pager - [phone]  After 6pm go to [email or web address] - Proofreader  [facility]  Office  [phone]  CC: Primary care physician; Caryl Bis, MD  Note: This dictation was prepared with Dragon dictation along with smaller phrase technology. Any transcriptional errors that result from this process are unintentional.

## 2019-04-02 NOTE — Progress Notes (Signed)
Pre HD Tx Assessment:    04/02/19 1540  Neurological  Level of Consciousness Alert  Orientation Level Oriented to person;Oriented to place;Oriented to situation  Respiratory  Respiratory Pattern Regular  Cardiac  Pulse Irregular  Heart Sounds S1, S2  Jugular Venous Distention (JVD) No  ECG Monitor Yes  Vascular  R Radial Pulse +2  L Radial Pulse +2  Psychosocial  Psychosocial (WDL) WDL

## 2019-04-02 NOTE — Op Note (Signed)
OPERATIVE NOTE    PRE-OPERATIVE DIAGNOSIS: 1. ESRD 2. Non-functional permcath  POST-OPERATIVE DIAGNOSIS: same as above  PROCEDURE: 1. Fluoroscopic guidance for placement of catheter 2. Placement of a 19 cm tip to cuff tunneled hemodialysis catheter via the right internal jugular vein and removal of previous catheter  SURGEON: Leotis Pain, MD  ANESTHESIA:  Local with moderate conscious sedation for 15 minutes using 1 mg of Versed and 50 mcg of Fentanyl  ESTIMATED BLOOD LOSS: 2 cc  FINDING(S): none  SPECIMEN(S):  None  INDICATIONS:   Patient is a 78 y.o.female who presents with non-functional dialysis catheter and ESRD.  The patient needs long term dialysis access for their ESRD, and a Permcath is necessary.  Risks and benefits are discussed and informed consent is obtained.    DESCRIPTION: After obtaining full informed written consent, the patient was brought back to the vascular suite. The patient received moderate conscious sedation during a face-to-face encounter with me present throughout the entire procedure and supervising the RN monitoring the vital signs, pulse oximetry, telemetry, and mental status throughout the entire procedure. The patient's existing catheter, right neck and chest were sterilely prepped and draped in a sterile surgical field was created.  The existing catheter was dissected free from the fibrous sheath securing the cuff with hemostats and blunt dissection.  A wire was placed. The existing catheter was then removed and the wire used to keep venous access. I selected a 19 cm tip to cuff tunneled dialysis catheter.  Using fluoroscopic guidance the catheter tips were parked in the right atrium. The appropriate distal connectors were placed. It withdrew blood well and flushed easily with heparinized saline and a concentrated heparin solution was then placed. It was secured to the chest wall with 2 Prolene sutures. A 4-0 Monocryl pursestring suture was placed around  the exit site. Sterile dressings were placed. The patient tolerated the procedure well and was taken to the recovery room in stable condition.  COMPLICATIONS: None  CONDITION: Stable  Leotis Pain 04/02/2019 2:17 PM   This note was created with Dragon Medical transcription system. Any errors in dictation are purely unintentional.

## 2019-04-02 NOTE — Progress Notes (Signed)
Post HD Tx Assessment:    04/02/19 1830  Neurological  Level of Consciousness Alert  Orientation Level Oriented X4  Respiratory  Respiratory Pattern Regular;Unlabored  Cardiac  Heart Sounds S1, S2  Jugular Venous Distention (JVD) No  ECG Monitor Yes  Vascular  R Radial Pulse +2  L Radial Pulse +2  Psychosocial  Psychosocial (WDL) WDL

## 2019-04-02 NOTE — Progress Notes (Signed)
CCMD called to say that patient is running prolonged QT. Dr Stark Jock informed, no new orders at this time.

## 2019-04-02 NOTE — Progress Notes (Signed)
HD Tx Initiation:   04/02/19 1540  Vital Signs  Temp 97.8 F (36.6 C)  Temp Source Oral  Pulse Rate 69  Pulse Rate Source Monitor  Resp 16  BP (!) 99/50  BP Location Left Arm  BP Method Automatic  Patient Position (if appropriate) Lying  Oxygen Therapy  SpO2 100 %  O2 Device Room Air  Pain Assessment  Pain Scale 0-10  Pain Score 0  During Hemodialysis Assessment  Blood Flow Rate (mL/min) 300 mL/min  Arterial Pressure (mmHg) -90 mmHg  Venous Pressure (mmHg) 80 mmHg  Transmembrane Pressure (mmHg) 50 mmHg  Ultrafiltration Rate (mL/min) 10 mL/min  Dialysate Flow Rate (mL/min) 500 ml/min  Conductivity: Machine  13.9  HD Safety Checks Performed Yes  Intra-Hemodialysis Comments Tx initiated  Hemodialysis Catheter Right Subclavian Double lumen Permanent (Tunneled)  Placement Date/Time: (c) 04/02/19 1411   Placed prior to admission: Yes  Time Out: Correct patient;Correct site;Correct procedure  Maximum sterile barrier precautions: Hand hygiene;Cap;Mask;Sterile gown  Site Prep: Chlorhexidine (preferred)  Ultrasoun...  Site Condition Other (Comment) (resistance to arterial, positional )  Blue Lumen Status Blood return noted  Red Lumen Status Blood return noted  Purple Lumen Status N/A  Dressing Type Biopatch;Occlusive  Dressing Status Clean;Dry;Intact  Interventions  (n/a)  Drainage Description None  Dressing Change Due 04/03/19

## 2019-04-02 NOTE — Care Management Important Message (Signed)
Important Message  Patient Details  Name: [name] MRN: [id number] Date of Birth: Jan 19, 1941   Medicare Important Message Given:  Yes     Dannette Barbara 04/02/2019, 11:43 AM

## 2019-04-02 NOTE — Progress Notes (Signed)
HD Tx completed:   04/02/19 1800  Vital Signs  Temp 98.5 F (36.9 C)  Temp Source Oral  Pulse Rate 81  Pulse Rate Source Monitor  Resp 16  BP (!) 79/38  BP Location Right Arm  BP Method Automatic  Patient Position (if appropriate) Lying  Oxygen Therapy  SpO2 99 %  O2 Device Room Air  Pain Assessment  Pain Scale 0-10  Pain Score 0  During Hemodialysis Assessment  Blood Flow Rate (mL/min) 300 mL/min  Arterial Pressure (mmHg) -70 mmHg  Venous Pressure (mmHg) 60 mmHg  Transmembrane Pressure (mmHg) 50 mmHg  Ultrafiltration Rate (mL/min) 10 mL/min  Dialysate Flow Rate (mL/min) 500 ml/min  Conductivity: Machine  14  HD Safety Checks Performed Yes  Intra-Hemodialysis Comments Progressing as prescribed;Tx completed

## 2019-04-02 NOTE — Progress Notes (Signed)
Order received from Dr. Stark Jock for STAT EKG.

## 2019-04-02 NOTE — Progress Notes (Signed)
Patient transported to specials.

## 2019-04-02 NOTE — Progress Notes (Signed)
Post HD Tx:    04/02/19 1745  Vital Signs  Pulse Rate 77  BP (!) 79/47  Oxygen Therapy  SpO2 100 %  Pain Assessment  Pain Scale 0-10  Pain Score 0  Post-Hemodialysis Assessment  Rinseback Volume (mL) 250 mL  KECN 41.9 V  Dialyzer Clearance Lightly streaked  Duration of HD Treatment -hour(s) 2.5 hour(s)  Hemodialysis Intake (mL) 800 mL  UF Total -Machine (mL) 20 mL  Net UF (mL) -780 mL  Tolerated HD Treatment Yes  Post-Hemodialysis Comments  (no complaints, pt asymptomatic)  AVG/AVF Arterial Site Held (minutes)  (n/a)  AVG/AVF Venous Site Held (minutes)  (n/a )  Education / Care Plan  Dialysis Education Provided Yes  Documented Education in Care Plan Yes  Outpatient Plan of Care Reviewed and on Chart Yes  Hemodialysis Catheter Right Subclavian Double lumen Permanent (Tunneled)  Placement Date/Time: (c) 04/02/19 1411   Placed prior to admission: Yes  Time Out: Correct patient;Correct site;Correct procedure  Maximum sterile barrier precautions: Hand hygiene;Cap;Mask;Sterile gown  Site Prep: Chlorhexidine (preferred)  Ultrasoun...  Site Condition Other (Comment) (positional)  Blue Lumen Status Heparin locked  Red Lumen Status Heparin locked  Purple Lumen Status N/A  Dressing Type Biopatch;Occlusive  Dressing Status Clean;Dry;Intact  Drainage Description None  Dressing Change Due 04/03/19

## 2019-04-02 NOTE — Progress Notes (Signed)
Pre HD Tx:    04/02/19 1530  Vital Signs  Temp 97.8 F (36.6 C)  Temp Source Oral  Pulse Rate 78  Pulse Rate Source Monitor  Resp 16  BP (!) 85/49  BP Location Left Arm  BP Method Automatic  Patient Position (if appropriate) Lying  Oxygen Therapy  SpO2 100 %  O2 Device Room Air  Pain Assessment  Pain Scale 0-10  Pain Score 0  Time-Out for Hemodialysis  What Procedure? HD   Pt Identifiers(min of two) First/Last Name;MRN/Account#;Pt's DOB(use if MRN/Acct# not available  Correct Site? Yes  Correct Side? Yes  Correct Procedure? Yes  Consents Verified? Yes  Rad Studies Available? N/A  Safety Precautions Reviewed? Yes  Engineer, civil (consulting) Number 2  Station Number 1  UF/Alarm Test Passed  Conductivity: Meter 13.6  Conductivity: Machine  13.9  pH 7.6  Reverse Osmosis main  Normal Saline Lot Number [id number]  Dialyzer Lot Number [id number]  Disposable Set Lot Number [id number]  Machine Temperature 98.6 F (37 C)  Musician and Audible Yes  Blood Lines Intact and Secured Yes  Pre Treatment Patient Checks  Vascular access used during treatment Catheter  HD catheter dressing before treatment WDL  Patient is receiving dialysis in a chair  (n/a)  Hepatitis B Surface Antigen Results Pending  Date Hepatitis B Surface Antigen Drawn 04/02/19  Isolation Initiated  (n/a)  Hepatitis B Surface Antibody  (< 3)  Date Hepatitis B Surface Antibody Drawn 03/06/19  Hemodialysis Consent Verified Yes  Hemodialysis Standing Orders Initiated Yes  ECG (Telemetry) Monitor On Yes  Prime Ordered Normal Saline  Length of  DialysisTreatment -hour(s) 2.5 Hour(s)  Dialysis Treatment Comments  (Na 140)  Dialyzer Elisio 17H NR  Dialysate 3K;2.5 Ca  Dialysate Flow Ordered 500  Blood Flow Rate Ordered 300 mL/min  Ultrafiltration Goal 0 Liters  Dialysis Blood Pressure Support Ordered Normal Saline  Education / Care Plan  Dialysis Education Provided Yes  Documented Education in Care  Plan Yes  Outpatient Plan of Care Reviewed and on Chart Yes  Hemodialysis Catheter Right Subclavian Double lumen Permanent (Tunneled)  Placement Date/Time: (c) 04/02/19 1411   Placed prior to admission: Yes  Time Out: Correct patient;Correct site;Correct procedure  Maximum sterile barrier precautions: Hand hygiene;Cap;Mask;Sterile gown  Site Prep: Chlorhexidine (preferred)  Ultrasoun...  Site Condition Other (Comment) (resistance in arterial, positional)  Blue Lumen Status Blood return noted  Red Lumen Status Blood return noted  Purple Lumen Status N/A  Dressing Type Biopatch;Occlusive  Dressing Status Clean;Dry;Intact  Interventions  (n/a)  Drainage Description None  Dressing Change Due 04/03/19

## 2019-04-02 NOTE — Progress Notes (Signed)
[facility]  ROUNDING NOTE   Subjective:   Peritoneal dialysis treatment last night. Patient complained of cramping during treatment.  UF of 2427  Objective:  Vital signs in last 24 hours:  Temp:  [97.6 F (36.4 C)-98.4 F (36.9 C)] 98.4 F (36.9 C) (08/03 1131) Pulse Rate:  [67-78] 77 (08/03 1131) Resp:  [12-18] 12 (08/03 1131) BP: (80-91)/(51-54) 80/52 (08/03 1131) SpO2:  [100 %] 100 % (08/03 1131) Weight:  [40.3 kg] 40.3 kg (08/03 0438)  Weight change:  Filed Weights   03/30/19 1807 04/02/19 0438  Weight: 42.2 kg 40.3 kg    Intake/Output: I/O last 3 completed shifts: In: 13216.7 [P.O.:420; I.V.:196.6; Blood:450; Other:12000; IV Piggyback:150] Out: 7564 [Other:8237]   Intake/Output this shift:  No intake/output data recorded.  Physical Exam: General: Ill appearing, laying in bed  Head: Moist oral mucosal membranes  Eyes: Anicteric, PERRL  Neck: Supple, trachea midline  Lungs:  Clear to auscultation  Heart: Regular rate and rhythm  Abdomen:  Soft, nontender, +ascites  Extremities: no peripheral edema.  Neurologic: Nonfocal, moving all four extremities  Skin: No lesions  Access: Right lower quadrant PD catheter, RIJ permcath    Basic Metabolic Panel: Recent Labs  Lab 03/30/19 1221 03/30/19 1813 03/30/19 2327 03/31/19 0538 03/31/19 1043 04/01/19 0625 04/02/19 0451  NA 120* 124*  --  124*  --  126* 124*  K 5.1 4.9  --  4.7  --  3.6 3.3*  CL 85* 87*  --  89*  --  89* 89*  CO2 17* 16*  --  14*  --  19* 16*  GLUCOSE 231* 135*  --  87  --  93 154*  BUN 115* 117*  --  129*  --  116* 106*  CREATININE 11.15* 12.10*  --  12.32*  --  11.26* 10.61*  CALCIUM 6.2* 6.2*  --  6.8*  --  6.2* 6.0*  MG  --   --  1.7  --   --  1.6* 1.5*  PHOS  --   --   --   --  14.4* 11.7* 10.4*    Liver Function Tests: Recent Labs  Lab 03/30/19 1221 03/30/19 1813  AST 30 30  ALT 37 37  ALKPHOS 137* 160*  BILITOT 0.8 0.8  PROT 5.0* 5.1*  ALBUMIN 2.3* 2.5*    Recent Labs  Lab 03/30/19 1813  LIPASE 97*   No results for input(s): AMMONIA in the last 168 hours.  CBC: Recent Labs  Lab 03/30/19 1221 03/30/19 1813 03/31/19 0538 04/01/19 0625 04/02/19 0451  WBC 15.9* 19.1* 19.6* 20.1* 13.2*  NEUTROABS 11.9*  --   --   --   --   HGB 7.5* 7.6* 6.7* 8.7* 8.2*  HCT 22.7* 22.8* 19.9* 24.4* 23.8*  MCV 86.0 84.1 83.6 82.2 83.5  PLT 193 218 189 162 109*    Cardiac Enzymes: No results for input(s): CKTOTAL, CKMB, CKMBINDEX, TROPONINI in the last 168 hours.  BNP: Invalid input(s): POCBNP  CBG: No results for input(s): GLUCAP in the last 168 hours.  Microbiology: Results for orders placed or performed during the hospital encounter of 03/30/19  Blood Culture (routine x 2)     Status: None (Preliminary result)   Collection Time: 03/30/19  7:41 PM   Specimen: BLOOD  Result Value Ref Range Status   Specimen Description BLOOD LEFT ANTECUBITAL  Final   Special Requests   Final    BOTTLES DRAWN AEROBIC AND ANAEROBIC Blood Culture adequate volume  Culture   Final    NO GROWTH 3 DAYS Performed at [facility], [address] [place], [place] [address]    Report Status PENDING  Incomplete  Blood Culture (routine x 2)     Status: None (Preliminary result)   Collection Time: 03/30/19  7:42 PM   Specimen: BLOOD  Result Value Ref Range Status   Specimen Description BLOOD RIGHT ANTECUBITAL  Final   Special Requests   Final    BOTTLES DRAWN AEROBIC AND ANAEROBIC Blood Culture results may not be optimal due to an excessive volume of blood received in culture bottles   Culture   Final    NO GROWTH 3 DAYS Performed at [facility], [address], [place], [place] [address]    Report Status PENDING  Incomplete  SARS Coronavirus 2 [facility] order, Performed in [place] hospital lab)     Status: None   Collection Time: 03/30/19  7:42 PM  Result Value Ref Range Status   SARS Coronavirus 2 NEGATIVE NEGATIVE Final     Comment: (NOTE) If result is NEGATIVE SARS-CoV-2 target nucleic acids are NOT DETECTED. The SARS-CoV-2 RNA is generally detectable in upper and lower  respiratory specimens during the acute phase of infection. The lowest  concentration of SARS-CoV-2 viral copies this assay can detect is 250  copies / mL. A negative result does not preclude SARS-CoV-2 infection  and should not be used as the sole basis for treatment or other  patient management decisions.  A negative result may occur with  improper specimen collection / handling, submission of specimen other  than nasopharyngeal swab, presence of viral mutation(s) within the  areas targeted by this assay, and inadequate number of viral copies  (<250 copies / mL). A negative result must be combined with clinical  observations, patient history, and epidemiological information. If result is POSITIVE SARS-CoV-2 target nucleic acids are DETECTED. The SARS-CoV-2 RNA is generally detectable in upper and lower  respiratory specimens dur ing the acute phase of infection.  Positive  results are indicative of active infection with SARS-CoV-2.  Clinical  correlation with patient history and other diagnostic information is  necessary to determine patient infection status.  Positive results do  not rule out bacterial infection or co-infection with other viruses. If result is PRESUMPTIVE POSTIVE SARS-CoV-2 nucleic acids MAY BE PRESENT.   A presumptive positive result was obtained on the submitted specimen  and confirmed on repeat testing.  While 2019 novel coronavirus  (SARS-CoV-2) nucleic acids may be present in the submitted sample  additional confirmatory testing may be necessary for epidemiological  and / or clinical management purposes  to differentiate between  SARS-CoV-2 and other Sarbecovirus currently known to infect humans.  If clinically indicated additional testing with an alternate test  methodology [phone]) is advised. The SARS-CoV-2  RNA is generally  detectable in upper and lower respiratory sp ecimens during the acute  phase of infection. The expected result is Negative. Fact Sheet for Patients:  StrictlyIdeas.no Fact Sheet for Healthcare Providers: BankingDealers.co.za This test is not yet approved or cleared by the [place] FDA and has been authorized for detection and/or diagnosis of SARS-CoV-2 by FDA under an Emergency Use Authorization (EUA).  This EUA will remain in effect (meaning this test can be used) for the duration of the COVID-19 declaration under Section 564(b)(1) of the Act, 21 U.S.C. section 360bbb-3(b)(1), unless the authorization is terminated or revoked sooner. Performed at [facility], [address] [place],  [place] [address]   Gastrointestinal Panel by PCR , Stool     Status: None   Collection Time: 03/30/19 11:36 PM   Specimen: Stool  Result Value Ref Range Status   Campylobacter species NOT DETECTED NOT DETECTED Final   Plesimonas shigelloides NOT DETECTED NOT DETECTED Final   Salmonella species NOT DETECTED NOT DETECTED Final   Yersinia enterocolitica NOT DETECTED NOT DETECTED Final   Vibrio species NOT DETECTED NOT DETECTED Final   Vibrio cholerae NOT DETECTED NOT DETECTED Final   Enteroaggregative E coli (EAEC) NOT DETECTED NOT DETECTED Final   Enteropathogenic E coli (EPEC) NOT DETECTED NOT DETECTED Final   Enterotoxigenic E coli (ETEC) NOT DETECTED NOT DETECTED Final   Shiga like toxin producing E coli (STEC) NOT DETECTED NOT DETECTED Final   Shigella/Enteroinvasive E coli (EIEC) NOT DETECTED NOT DETECTED Final   Cryptosporidium NOT DETECTED NOT DETECTED Final   Cyclospora cayetanensis NOT DETECTED NOT DETECTED Final   Entamoeba histolytica NOT DETECTED NOT DETECTED Final   Giardia lamblia NOT DETECTED NOT DETECTED Final   Adenovirus F40/41 NOT DETECTED NOT DETECTED Final   Astrovirus NOT DETECTED NOT DETECTED Final    Norovirus GI/GII NOT DETECTED NOT DETECTED Final   Rotavirus A NOT DETECTED NOT DETECTED Final   Sapovirus (I, II, IV, and V) NOT DETECTED NOT DETECTED Final    Comment: Performed at [facility], [address] [place], [place] [address]  Body fluid culture     Status: None (Preliminary result)   Collection Time: 03/31/19  1:11 PM   Specimen: Ascitic; Body Fluid  Result Value Ref Range Status   Specimen Description   Final    ASCITIC Performed at [facility], [address] [place], [place] [address]    Special Requests   Final    Immunocompromised Performed at [facility], [place], [place] [address]    Gram Stain   Final    MODERATE WBC PRESENT,BOTH PMN AND MONONUCLEAR NO ORGANISMS SEEN    Culture   Final    NO GROWTH 2 DAYS Performed at [facility], [address], [place], [place] [address]    Report Status PENDING  Incomplete    Coagulation Studies: No results for input(s): LABPROT, INR in the last 72 hours.  Urinalysis: No results for input(s): COLORURINE, LABSPEC, PHURINE, GLUCOSEU, HGBUR, BILIRUBINUR, KETONESUR, PROTEINUR, UROBILINOGEN, NITRITE, LEUKOCYTESUR in the last 72 hours.  Invalid input(s): APPERANCEUR    Imaging: No results found.   Medications:   . sodium chloride 250 mL (04/02/19 1121)  . 0.9 % NaCl with KCl 20 mEq / L 100 mL/hr at 04/02/19 0810  . dialysis solution 1.5% low-MG/low-CA    . piperacillin-tazobactam (ZOSYN)  IV 3.375 g (04/02/19 1123)   . buPROPion  100 mg Oral Daily  . calcium-vitamin D  1 tablet Oral TID  . Chlorhexidine Gluconate Cloth  6 each Topical Q0600  . cholecalciferol  2,000 Units Oral Daily  . FLUoxetine  40 mg Oral Daily  . gentamicin cream  1 application Topical Daily  . midodrine  10 mg Oral TID PC  . multivitamin-lutein  1 capsule Oral Daily  . pantoprazole  40 mg Oral BID  . sodium chloride flush  3 mL Intravenous Q12H  . traZODone  100 mg Oral QHS  .  vitamin B-12  500 mcg Oral Daily   sodium chloride, acetaminophen **OR** acetaminophen, loperamide, ondansetron (ZOFRAN) IV, sodium chloride flush  Assessment/ Plan:  [name] is a 78 y.o. 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   [facility] Peritoneal dialysis CCPD 1.5 Liters 4 exchanges.   1. End-stage renal disease with uremia 2. Hyponatremia 3. Metabolic acidosis 4. Anemia of chronic kidney disease status post 1 unit PRBC on 8/1 5. Secondary Hyperparathyroidism with Hypocalcemia and hyperphosphatemia 6. Hypotension  Plan Peritoneal dialysis treatment last night. With cramping.  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 No signs of peritonitis. However with uremia, metabolic acidosis, hyponatremia, and hypokalemia - Will need back up hemodialysis but with complication of hemodialysis access. Will have vascular surgery exchange tunneled dialysis catheter today and plan on hemodialysis back up treatment today.   - Empiric zosyn.  - Holding EPO due to malignancy.  - Continue Midodrine for blood pressure - Continue calcium acetate    LOS: 3 Atticus Lemberger 8/3/202012:57 PM

## 2019-04-02 NOTE — H&P (Signed)
 [facility] History & Physical Update  The patient was interviewed and re-examined.  The patient's previous History and Physical has been reviewed and is unchanged.  There is no change in the plan of care. We plan to proceed with the scheduled procedure.  Leotis Pain, MD  04/02/2019, 1:34 PM

## 2019-04-03 DIAGNOSIS — N186 End stage renal disease: Secondary | ICD-10-CM

## 2019-04-03 DIAGNOSIS — Z992 Dependence on renal dialysis: Secondary | ICD-10-CM

## 2019-04-03 DIAGNOSIS — T82898A Other specified complication of vascular prosthetic devices, implants and grafts, initial encounter: Secondary | ICD-10-CM

## 2019-04-03 LAB — PHOSPHORUS: Phosphorus: 4.9 mg/dL — ABNORMAL HIGH (ref 2.5–4.6)

## 2019-04-03 LAB — CBC
HCT: 21.7 % — ABNORMAL LOW (ref 36.0–46.0)
Hemoglobin: 7.3 g/dL — ABNORMAL LOW (ref 12.0–15.0)
MCH: 29 pg (ref 26.0–34.0)
MCHC: 33.6 g/dL (ref 30.0–36.0)
MCV: 86.1 fL (ref 80.0–100.0)
Platelets: 90 10*3/uL — ABNORMAL LOW (ref 150–400)
RBC: 2.52 MIL/uL — ABNORMAL LOW (ref 3.87–5.11)
RDW: 15.5 % (ref 11.5–15.5)
WBC: 10.1 10*3/uL (ref 4.0–10.5)
nRBC: 0 % (ref 0.0–0.2)

## 2019-04-03 LAB — HEPATITIS B SURFACE ANTIGEN: Hepatitis B Surface Ag: NEGATIVE

## 2019-04-03 LAB — STOOL CULTURE: E coli, Shiga toxin Assay: NEGATIVE

## 2019-04-03 LAB — MAGNESIUM: Magnesium: 1.5 mg/dL — ABNORMAL LOW (ref 1.7–2.4)

## 2019-04-03 LAB — CALCIUM, IONIZED: Calcium, Ionized, Serum: 3.3 mg/dL — ABNORMAL LOW (ref 4.5–5.6)

## 2019-04-03 LAB — STOOL CULTURE REFLEX - CMPCXR

## 2019-04-03 LAB — HEPATITIS B SURFACE ANTIBODY,QUALITATIVE: Hep B S Ab: NONREACTIVE

## 2019-04-03 LAB — STOOL CULTURE REFLEX - RSASHR

## 2019-04-03 LAB — HEPATITIS B CORE ANTIBODY, IGM: Hep B C IgM: NEGATIVE

## 2019-04-03 MED ORDER — RENA-VITE PO TABS
1.0000 | ORAL_TABLET | Freq: Every day | ORAL | Status: DC
  Administered 2019-04-03 – 2019-04-05 (×3): 1 via ORAL
  Filled 2019-04-03 (×3): qty 1

## 2019-04-03 MED ORDER — METRONIDAZOLE IN NACL 5-0.79 MG/ML-% IV SOLN
500.0000 mg | Freq: Three times a day (TID) | INTRAVENOUS | Status: DC
  Administered 2019-04-03 – 2019-04-06 (×9): 500 mg via INTRAVENOUS
  Filled 2019-04-03 (×11): qty 100

## 2019-04-03 MED ORDER — PRO-STAT SUGAR FREE PO LIQD
30.0000 mL | Freq: Two times a day (BID) | ORAL | Status: DC
  Administered 2019-04-03 – 2019-04-05 (×5): 30 mL via ORAL

## 2019-04-03 MED ORDER — MAGNESIUM SULFATE IN D5W 1-5 GM/100ML-% IV SOLN
1.0000 g | Freq: Once | INTRAVENOUS | Status: AC
  Administered 2019-04-03: 1 g via INTRAVENOUS
  Filled 2019-04-03: qty 100

## 2019-04-03 MED ORDER — NYSTATIN 100000 UNIT/ML MT SUSP
5.0000 mL | Freq: Four times a day (QID) | OROMUCOSAL | Status: DC
  Administered 2019-04-03 – 2019-04-06 (×11): 500000 [IU] via OROMUCOSAL
  Filled 2019-04-03 (×10): qty 5

## 2019-04-03 MED ORDER — CIPROFLOXACIN IN D5W 400 MG/200ML IV SOLN
400.0000 mg | INTRAVENOUS | Status: DC
  Administered 2019-04-03 – 2019-04-05 (×3): 400 mg via INTRAVENOUS
  Filled 2019-04-03 (×4): qty 200

## 2019-04-03 NOTE — Progress Notes (Signed)
Notified angela seals of pt low b/p. Acknowledged and no new orders at this time. Cont to monitor pt

## 2019-04-03 NOTE — Progress Notes (Signed)
[facility]  ROUNDING NOTE   Subjective:   Seen and examined on peritoneal dialysis treatment today. Tolerating treatment well.  Getting IV NS with 75mEq KCL at 142mL/hr  Patient received back up hemodialysis yesterday after RIJ tunneled catheter exchanged. Tolerated treatment well. UF of 1.5 liters.   Objective:  Vital signs in last 24 hours:  Temp:  [97.8 F (36.6 C)-98.9 F (37.2 C)] 98 F (36.7 C) (08/04 1210) Pulse Rate:  [69-86] 79 (08/04 1210) Resp:  [11-17] 16 (08/04 0700) BP: (67-104)/(37-74) 89/50 (08/04 1210) SpO2:  [98 %-100 %] 100 % (08/04 1210) Weight:  [40.3 kg] 40.3 kg (08/03 1310)  Weight change: 0 kg Filed Weights   03/30/19 1807 04/02/19 0438 04/02/19 1310  Weight: 42.2 kg 40.3 kg 40.3 kg    Intake/Output: I/O last 3 completed shifts: In: 6613.5 [I.V.:490.5; Other:6000; IV Piggyback:123] Out: -1560    Intake/Output this shift:  Total I/O In: 240 [P.O.:240] Out: -   Physical Exam: General: Ill appearing, laying in bed  Head: Moist oral mucosal membranes  Eyes: Anicteric, PERRL  Neck: Supple, trachea midline  Lungs:  Clear to auscultation  Heart: Regular rate and rhythm  Abdomen:  Soft, nontender, +ascites  Extremities: no peripheral edema.  Neurologic: Nonfocal, moving all four extremities  Skin: No lesions  Access: Right lower quadrant PD catheter, RIJ permcath    Basic Metabolic Panel: Recent Labs  Lab 03/30/19 1813 03/30/19 2327 03/31/19 0538 03/31/19 1043 04/01/19 0625 04/02/19 0451 04/03/19 0430  NA 124*  --  124*  --  126* 124* 134*  K 4.9  --  4.7  --  3.6 3.3* 3.3*  CL 87*  --  89*  --  89* 89* 102  CO2 16*  --  14*  --  19* 16* 23  GLUCOSE 135*  --  87  --  93 154* 126*  BUN 117*  --  129*  --  116* 106* 45*  CREATININE 12.10*  --  12.32*  --  11.26* 10.61* 5.54*  CALCIUM 6.2*  --  6.8*  --  6.2* 6.0* 6.1*  MG  --  1.7  --   --  1.6* 1.5* 1.5*  PHOS  --   --   --  14.4* 11.7* 10.4* 4.9*    Liver  Function Tests: Recent Labs  Lab 03/30/19 1221 03/30/19 1813  AST 30 30  ALT 37 37  ALKPHOS 137* 160*  BILITOT 0.8 0.8  PROT 5.0* 5.1*  ALBUMIN 2.3* 2.5*   Recent Labs  Lab 03/30/19 1813  LIPASE 97*   No results for input(s): AMMONIA in the last 168 hours.  CBC: Recent Labs  Lab 03/30/19 1221 03/30/19 1813 03/31/19 0538 04/01/19 0625 04/02/19 0451 04/03/19 0430  WBC 15.9* 19.1* 19.6* 20.1* 13.2* 10.1  NEUTROABS 11.9*  --   --   --   --   --   HGB 7.5* 7.6* 6.7* 8.7* 8.2* 7.3*  HCT 22.7* 22.8* 19.9* 24.4* 23.8* 21.7*  MCV 86.0 84.1 83.6 82.2 83.5 86.1  PLT 193 218 189 162 109* 90*    Cardiac Enzymes: No results for input(s): CKTOTAL, CKMB, CKMBINDEX, TROPONINI in the last 168 hours.  BNP: Invalid input(s): POCBNP  CBG: Recent Labs  Lab 04/02/19 1317 04/02/19 1426  GLUCAP 107* 122*    Microbiology: Results for orders placed or performed during the hospital encounter of 03/30/19  Blood Culture (routine x 2)     Status: None (Preliminary result)   Collection Time:  03/30/19  7:41 PM   Specimen: BLOOD  Result Value Ref Range Status   Specimen Description BLOOD LEFT ANTECUBITAL  Final   Special Requests   Final    BOTTLES DRAWN AEROBIC AND ANAEROBIC Blood Culture adequate volume   Culture   Final    NO GROWTH 4 DAYS Performed at [facility], [address], [place], [place] [address]    Report Status PENDING  Incomplete  Blood Culture (routine x 2)     Status: None (Preliminary result)   Collection Time: 03/30/19  7:42 PM   Specimen: BLOOD  Result Value Ref Range Status   Specimen Description BLOOD RIGHT ANTECUBITAL  Final   Special Requests   Final    BOTTLES DRAWN AEROBIC AND ANAEROBIC Blood Culture results may not be optimal due to an excessive volume of blood received in culture bottles   Culture   Final    NO GROWTH 4 DAYS Performed at [facility], [address] [place], [place] [address]    Report Status PENDING   Incomplete  SARS Coronavirus 2 [facility] order, Performed in [place] hospital lab)     Status: None   Collection Time: 03/30/19  7:42 PM  Result Value Ref Range Status   SARS Coronavirus 2 NEGATIVE NEGATIVE Final    Comment: (NOTE) If result is NEGATIVE SARS-CoV-2 target nucleic acids are NOT DETECTED. The SARS-CoV-2 RNA is generally detectable in upper and lower  respiratory specimens during the acute phase of infection. The lowest  concentration of SARS-CoV-2 viral copies this assay can detect is 250  copies / mL. A negative result does not preclude SARS-CoV-2 infection  and should not be used as the sole basis for treatment or other  patient management decisions.  A negative result may occur with  improper specimen collection / handling, submission of specimen other  than nasopharyngeal swab, presence of viral mutation(s) within the  areas targeted by this assay, and inadequate number of viral copies  (<250 copies / mL). A negative result must be combined with clinical  observations, patient history, and epidemiological information. If result is POSITIVE SARS-CoV-2 target nucleic acids are DETECTED. The SARS-CoV-2 RNA is generally detectable in upper and lower  respiratory specimens dur ing the acute phase of infection.  Positive  results are indicative of active infection with SARS-CoV-2.  Clinical  correlation with patient history and other diagnostic information is  necessary to determine patient infection status.  Positive results do  not rule out bacterial infection or co-infection with other viruses. If result is PRESUMPTIVE POSTIVE SARS-CoV-2 nucleic acids MAY BE PRESENT.   A presumptive positive result was obtained on the submitted specimen  and confirmed on repeat testing.  While 2019 novel coronavirus  (SARS-CoV-2) nucleic acids may be present in the submitted sample  additional confirmatory testing may be necessary for epidemiological  and / or clinical management  purposes  to differentiate between  SARS-CoV-2 and other Sarbecovirus currently known to infect humans.  If clinically indicated additional testing with an alternate test  methodology [phone]) is advised. The SARS-CoV-2 RNA is generally  detectable in upper and lower respiratory sp ecimens during the acute  phase of infection. The expected result is Negative. Fact Sheet for Patients:  StrictlyIdeas.no Fact Sheet for Healthcare Providers: BankingDealers.co.za This test is not yet approved or cleared by the [place] FDA and has been authorized for detection and/or diagnosis of SARS-CoV-2 by FDA under an Emergency Use Authorization (EUA).  This EUA will  remain in effect (meaning this test can be used) for the duration of the COVID-19 declaration under Section 564(b)(1) of the Act, 21 U.S.C. section 360bbb-3(b)(1), unless the authorization is terminated or revoked sooner. Performed at [facility], [address] [place], [place] [address]   Gastrointestinal Panel by PCR , Stool     Status: None   Collection Time: 03/30/19 11:36 PM   Specimen: Stool  Result Value Ref Range Status   Campylobacter species NOT DETECTED NOT DETECTED Final   Plesimonas shigelloides NOT DETECTED NOT DETECTED Final   Salmonella species NOT DETECTED NOT DETECTED Final   Yersinia enterocolitica NOT DETECTED NOT DETECTED Final   Vibrio species NOT DETECTED NOT DETECTED Final   Vibrio cholerae NOT DETECTED NOT DETECTED Final   Enteroaggregative E coli (EAEC) NOT DETECTED NOT DETECTED Final   Enteropathogenic E coli (EPEC) NOT DETECTED NOT DETECTED Final   Enterotoxigenic E coli (ETEC) NOT DETECTED NOT DETECTED Final   Shiga like toxin producing E coli (STEC) NOT DETECTED NOT DETECTED Final   Shigella/Enteroinvasive E coli (EIEC) NOT DETECTED NOT DETECTED Final   Cryptosporidium NOT DETECTED NOT DETECTED Final   Cyclospora cayetanensis NOT DETECTED  NOT DETECTED Final   Entamoeba histolytica NOT DETECTED NOT DETECTED Final   Giardia lamblia NOT DETECTED NOT DETECTED Final   Adenovirus F40/41 NOT DETECTED NOT DETECTED Final   Astrovirus NOT DETECTED NOT DETECTED Final   Norovirus GI/GII NOT DETECTED NOT DETECTED Final   Rotavirus A NOT DETECTED NOT DETECTED Final   Sapovirus (I, II, IV, and V) NOT DETECTED NOT DETECTED Final    Comment: Performed at [facility], [address] [place], [place] [address]  Body fluid culture     Status: None (Preliminary result)   Collection Time: 03/31/19  1:11 PM   Specimen: Ascitic; Body Fluid  Result Value Ref Range Status   Specimen Description   Final    ASCITIC Performed at [facility], [address] [place], [place] [address]    Special Requests   Final    Immunocompromised Performed at [facility], [place], [place] [address]    Gram Stain   Final    MODERATE WBC PRESENT,BOTH PMN AND MONONUCLEAR NO ORGANISMS SEEN    Culture   Final    NO GROWTH 3 DAYS Performed at [facility], [place] [address] [place], [place] [address]    Report Status PENDING  Incomplete    Coagulation Studies: No results for input(s): LABPROT, INR in the last 72 hours.  Urinalysis: No results for input(s): COLORURINE, LABSPEC, PHURINE, GLUCOSEU, HGBUR, BILIRUBINUR, KETONESUR, PROTEINUR, UROBILINOGEN, NITRITE, LEUKOCYTESUR in the last 72 hours.  Invalid input(s): APPERANCEUR    Imaging: No results found.   Medications:   . sodium chloride 250 mL (04/03/19 0935)  . 0.9 % NaCl with KCl 20 mEq / L 100 mL/hr at 04/02/19 2017  . ciprofloxacin    . dialysis solution 1.5% low-MG/low-CA    . metronidazole     . buPROPion  100 mg Oral Daily  . calcium-vitamin D  1 tablet Oral TID  . Chlorhexidine Gluconate Cloth  6 each Topical Q0600  . cholecalciferol  2,000 Units Oral Daily  . feeding supplement (PRO-STAT SUGAR FREE 64)  30 mL Oral BID  .  FLUoxetine  40 mg Oral Daily  . gentamicin cream  1 application Topical Daily  . midodrine  10 mg Oral TID PC  . multivitamin  1 tablet  Oral QHS  . multivitamin-lutein  1 capsule Oral Daily  . pantoprazole  40 mg Oral BID  . sodium chloride flush  3 mL Intravenous Q12H  . traZODone  100 mg Oral QHS  . vitamin B-12  500 mcg Oral Daily   sodium chloride, acetaminophen **OR** acetaminophen, HYDROmorphone (DILAUDID) injection, loperamide, ondansetron (ZOFRAN) IV, sodium chloride flush  Assessment/ Plan:  [name] is a 78 y.o. 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   [facility] Peritoneal dialysis CCPD 1.5 Liters 4 exchanges.   1. End-stage renal disease with uremia 2. Hyponatremia 3. Metabolic acidosis 4. Anemia of chronic kidney disease status post 1 unit PRBC on 8/1 5. Secondary Hyperparathyroidism with Hypocalcemia and hyperphosphatemia. Currently on calcium acetate 6. Hypotension  Plan Seen and examined on peritoneal dialysis. With chylous ascites. No signs or symptoms of peritonitis. Required back up hemodialysis yesterday. Tolerated treatment well.  Plan on back up hemodialysis again tomorrow.  - Consult physical therapy   LOS: 4 [place] 8/4/202012:30 PM

## 2019-04-03 NOTE — Progress Notes (Addendum)
[place] [facility] [place] NAME: Richetta Cubillos    MR#:  [id number]  DATE OF BIRTH:  15-Aug-1941  SUBJECTIVE:  CHIEF COMPLAINT:   Chief Complaint  Patient presents with  . Abdominal Pain   No new complaint this morning.  Diarrhea and abdominal pains improved.  No fevers overnight.  Had hemodialysis after placement of permacath yesterday.  Getting peritoneal dialysis today.  REVIEW OF SYSTEMS:  Review of Systems  Constitutional: Negative for chills and fever.  HENT: Negative for hearing loss and tinnitus.   Eyes: Negative for blurred vision and double vision.  Respiratory: Negative for cough and shortness of breath.   Cardiovascular: Negative for chest pain and palpitations.  Gastrointestinal: Negative for abdominal pain, heartburn, nausea and vomiting.  Genitourinary: Negative for dysuria and urgency.  Musculoskeletal: Negative for myalgias and neck pain.  Skin: Negative for itching and rash.  Neurological: Negative for dizziness and headaches.  Psychiatric/Behavioral: Negative for depression and hallucinations.    DRUG ALLERGIES:   Allergies  Allergen Reactions  . Codeine Other (See Comments)    Reaction unknown    VITALS:  Blood pressure (!) 89/50, pulse 79, temperature 98 F (36.7 C), temperature source Oral, resp. rate 16, height 5\' 2"  (1.575 m), weight 40.3 kg, SpO2 100 %. PHYSICAL EXAMINATION:  Physical Exam  GENERAL:  78 y.o.-year-old patient lying in the bed with no acute distress.  EYES: Pupils equal, round, reactive to light and accommodation. No scleral icterus. Extraocular muscles intact.  HEENT: Head atraumatic, normocephalic. Oropharynx and nasopharynx clear.  NECK:  Supple, no jugular venous distention. No thyroid enlargement, no tenderness.  LUNGS: Normal breath sounds bilaterally, no wheezing, rales,rhonchi or crepitation. No use of accessory muscles of respiration.  CARDIOVASCULAR: S1, S2 normal. No murmurs,  rubs, or gallops.  ABDOMEN: Soft, lower abdominal tenderness PD catheter in place, nondistended. Bowel sounds present. No rebound or guarding  EXTREMITIES: No pedal edema, cyanosis, or clubbing.  NEUROLOGIC: Cranial nerves II through XII are intact. Muscle strength 5/5 in all extremities. Sensation intact. Gait not checked.  PSYCHIATRIC: The patient is currently sleepy  sKIN: No obvious rash, lesion, or ulcer.   LABORATORY PANEL:  Female CBC Recent Labs  Lab 04/03/19 0430  WBC 10.1  HGB 7.3*  HCT 21.7*  PLT 90*   ------------------------------------------------------------------------------------------------------------------ Chemistries  Recent Labs  Lab 03/30/19 1813  04/03/19 0430  NA 124*   < > 134*  K 4.9   < > 3.3*  CL 87*   < > 102  CO2 16*   < > 23  GLUCOSE 135*   < > 126*  BUN 117*   < > 45*  CREATININE 12.10*   < > 5.54*  CALCIUM 6.2*   < > 6.1*  MG  --    < > 1.5*  AST 30  --   --   ALT 37  --   --   ALKPHOS 160*  --   --   BILITOT 0.8  --   --    < > = values in this interval not displayed.   RADIOLOGY:  No results found. ASSESSMENT AND PLAN:   Patient 78 year old white female who is presenting with abdominal pain and diarrhea  1.   Acute colitis involving the transverse colon and ascending colon Found on CT scan.  Patient presented with abdominal pain and diarrhea, C. difficile negative.  GI panel negative. Noticed improvement with IV antibiotics with Zosyn.  Abdominal pains and  diarrhea improved.  However due to worsening thrombocytopenia Zosyn was discontinued today.  Patient started on IV ciprofloxacin and Flagyl. Also noted incidental findings on CT scan including cirrhosis with sequelae of portal hypertension as well as complex bilateral renal masses not well visualized.  Follow-up with contrast-enhanced outpatient renal mass protocol recommended.  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  Follow-up with non-emergent outpatient ultrasound also  recommended. Leukocytosis improving.  Prior diagnosis of lung mass. Patient was supposed to follow-up with pulmonologist as outpatient for further evaluation to include bronchoscopy with possible biopsy.  I talked with Dr. Lillard Anes during this admission who will make arrangements for outpatient follow-up post discharge from the hospital.  2.  End-stage renal disease  Patient missed peritoneal dialysis prior to admission. Patient had permacath replaced on zero 8/0 11/17/2018 and subsequently had hemodialysis same day.  This morning patient getting peritoneal dialysis.  Discussed with nephrologist.  Alternating between peritoneal and hemodialysis for now.  No signs of peritonitis clinically at this time.    3.  Hyponatremia due to dehydration  Patient on gentle IV fluid hydration.  Sodium level of 134 this morning with hemodialysis  4.  Hypotension continue midodrine. Increased rate of IV fluids this morning.  Monitor  5.  Diabetes type 2 we will place on sliding scale insulin currently not on any medications at home  6.  Depression anxiety continue Wellbutrin and trazodone  7.    Acute on chronic anemia.   Hemoglobin down to 6.7.  Transfused with 1 unit of packed red blood cells.  Hemoglobin noted to be 7.3 today.  Follow-up on CBC in a.m.  8.  Thrombocytopenia Platelet count down to 90.  Zosyn discontinued.  Follow-up CBC in a.m.  DVT prophylaxis; hold off on heparin due to anemia requiring packed red blood cell transfusion as well as thrombocytopenia. SCDs Physical therapy consult placed to evaluate and treat  All the records are reviewed and case discussed with Care Management/Social Worker. Management plans discussed with the patient, family and they are in agreement. I called and updated patient's daughter [name] on treatment plans recently as outlined above.  All questions were answered and she is in agreement with the plan of care.  CODE STATUS: Full Code  TOTAL TIME  TAKING CARE OF THIS PATIENT: 35 minutes.   More than 50% of the time was spent in counseling/coordination of care: YES  POSSIBLE D/C IN 2 DAYS, DEPENDING ON CLINICAL CONDITION.   Dacoda Spallone M.D on 04/03/2019 at 1:59 PM  Between 7am to 6pm - Pager - [phone]  After 6pm go to [email or web address] - Proofreader  [facility] [place]  Office  [phone]  CC: Primary care physician; Caryl Bis, MD  Note: This dictation was prepared with Dragon dictation along with smaller phrase technology. Any transcriptional errors that result from this process are unintentional.

## 2019-04-03 NOTE — Progress Notes (Addendum)
Initial Nutrition Assessment  DOCUMENTATION CODES:   Underweight  INTERVENTION:   Prostat liquid protein PO 30 ml BID with meals, each supplement provides 100 kcal, 15 grams protein.  Rena-vite daily   Sodium and Phosphorus restrictions added via Health Touch  NUTRITION DIAGNOSIS:   Increased nutrient needs related to chronic illness(ESRD on PD, cirrhosis) as evidenced by increased estimated needs.  GOAL:   Patient will meet greater than or equal to 90% of their needs  MONITOR:   PO intake, Supplement acceptance, Weight trends, Skin, I & O's  REASON FOR ASSESSMENT:   Other (Comment)(Low BMI)    ASSESSMENT:   78 y/o female with h/o cirrhosis, esophageal varices, ESRD on PD, DM, anemia, RCC, NSTEMI, GERD, HTN admitted with colitis  RD working remotely.  RD familiar with this pt from recent previous admit ~ 2 weeks ago. Pt with fair appetite and oral intake at baseline. Pt eating 100% of meals during her last admit and eating 30-100% of meals this admit. Pt does not like milky supplements. RD will add Prostat supplement. RD will also add rena-vite to help pt meet her estimated needs and replace losses from PD. Per chart, pt has lost 26lbs(23%) over the past 3 months; this is severe weight loss.   RD suspects pt with severe malnutrition but unable to diagnose at this time as NFPE cannot be performed.   Medications reviewed and include: oscal w/ D, D3, ocuvite, protonix, B12, Mg sulfate, zosyn  Labs reviewed: Na 134(L), K 3.3(L), BUN 45(H), creat 5.54(H), Ca 6.1(L), P 4.9(H), Mg 1.5(L) Hgb 7.3(L), Hct 21.7(L) Iron 31, TIBC 270, ferritin 284, B12 >7500- 7/18 cbgs- 107, 122 x 24 hrs AIC 5.9(H)- 7/18  Unable to complete Nutrition-Focused physical exam at this time.   Diet Order:   Diet Order            Diet renal/carb modified with fluid restriction Diet-HS Snack? Nothing; Fluid restriction: 1200 mL Fluid; Room service appropriate? Yes; Fluid consistency: Thin  Diet  effective now             EDUCATION NEEDS:   Education needs have been addressed  Skin:  Skin Assessment: Reviewed RN Assessment(ecchymosis, MASD)  Last BM:  8/3- type 6  Height:   Ht Readings from Last 1 Encounters:  04/02/19 5\' 2"  (1.575 m)    Weight:   Wt Readings from Last 1 Encounters:  04/02/19 40.3 kg    Ideal Body Weight:  50 kg  BMI:  Body mass index is 16.25 kg/m.  Estimated Nutritional Needs:   Kcal:  1500-1700kcal/day  Protein:  70-80g/day  Fluid:  UOP +1L  Koleen Distance MS, RD, LDN Pager #- [phone] Office#- [phone] After Hours Pager: [phone]

## 2019-04-03 NOTE — Progress Notes (Signed)
Established Peritoneal dialysis patient known at [facility], Please contact me directly for any dialysis placement concerns.  Elvera Bicker Dialysis Coordinator [phone]

## 2019-04-03 NOTE — Progress Notes (Signed)
PT Cancellation Note  Patient Details Name: [name] MRN: [id number] DOB: November 11, 1940   Cancelled Treatment:    Reason Eval/Treat Not Completed: Other (comment)(Chart reviewed, pt with calcium levels of 6.1, soft BP per chart review and hemoglobin also trending down. PT to hold per practice guidelines until patient is more appropriate for exertional activity.)   Lieutenant Diego PT, DPT 1:11 PM,04/03/19 [phone]

## 2019-04-03 NOTE — Progress Notes (Signed)
CCPD discontinued without issue. Catheter capped and clamped. Note: per machine patient had initial drain of 2161mL. Total UF 1646mL. Patient currently without complaints. Exit site care performed earlier in am. Report given to primary RN.

## 2019-04-03 NOTE — Consult Note (Signed)
Pharmacy Antibiotic Note  [name] is a 78 y.o. female admitted on 03/30/2019 with colitis.  Pharmacy has been consulted for Zosyn dosing. She has just started transitioning to PD and had PD monday, Tuesday and Wednesday this week (7/27-8/1)  Plan: Zosyn 3.375g IV q12h (4 hour infusion).  Height: 5\' 2"  (157.5 cm) Weight: 88 lb 13.5 oz (40.3 kg) IBW/kg (Calculated) : 50.1  Temp (24hrs), Avg:98.4 F (36.9 C), Min:97.8 F (36.6 C), Max:98.9 F (37.2 C)  Recent Labs  Lab 03/30/19 1813 03/30/19 2327 03/31/19 0538 04/01/19 0625 04/02/19 0451 04/03/19 0430  WBC 19.1*  --  19.6* 20.1* 13.2* 10.1  CREATININE 12.10*  --  12.32* 11.26* 10.61* 5.54*  LATICACIDVEN 1.6 1.5  --   --   --   --     Estimated Creatinine Clearance: 5.2 mL/min (A) (by C-G formula based on SCr of 5.54 mg/dL (H)).    Allergies  Allergen Reactions  . Codeine Other (See Comments)    Reaction unknown     Antimicrobials this admission: Zosyn 8/1 >>  vancomycin 7/31 x1  Microbiology results: 7/31 BCx: NGTD 7/31 C diff: negative, GI panel negative  7/31 SARS CoV-2: negative   Thank you for allowing pharmacy to be a part of this patient's care.  Xan Sparkman A, PharmD 04/03/2019 9:33 AM

## 2019-04-04 ENCOUNTER — Telehealth: Payer: Self-pay | Admitting: Internal Medicine

## 2019-04-04 DIAGNOSIS — Z992 Dependence on renal dialysis: Secondary | ICD-10-CM | POA: Diagnosis not present

## 2019-04-04 DIAGNOSIS — N186 End stage renal disease: Secondary | ICD-10-CM | POA: Diagnosis not present

## 2019-04-04 LAB — CBC
HCT: 21.9 % — ABNORMAL LOW (ref 36.0–46.0)
Hemoglobin: 7.2 g/dL — ABNORMAL LOW (ref 12.0–15.0)
MCH: 28.7 pg (ref 26.0–34.0)
MCHC: 32.9 g/dL (ref 30.0–36.0)
MCV: 87.3 fL (ref 80.0–100.0)
Platelets: 108 10*3/uL — ABNORMAL LOW (ref 150–400)
RBC: 2.51 MIL/uL — ABNORMAL LOW (ref 3.87–5.11)
RDW: 15.9 % — ABNORMAL HIGH (ref 11.5–15.5)
WBC: 13.6 10*3/uL — ABNORMAL HIGH (ref 4.0–10.5)
nRBC: 0.1 % (ref 0.0–0.2)

## 2019-04-04 LAB — BPAM RBC
Blood Product Expiration Date: 202008222359
Blood Product Expiration Date: 202008242359
Blood Product Expiration Date: 202008242359
ISSUE DATE / TIME: 202008011702
Unit Type and Rh: 6200
Unit Type and Rh: 6200
Unit Type and Rh: 6200

## 2019-04-04 LAB — BODY FLUID CULTURE: Culture: NO GROWTH

## 2019-04-04 LAB — TYPE AND SCREEN
ABO/RH(D): A POS
Antibody Screen: POSITIVE
Unit division: 0
Unit division: 0
Unit division: 0

## 2019-04-04 LAB — BASIC METABOLIC PANEL
Anion gap: 9 (ref 5–15)
Anion gap: 11 (ref 5–15)
BUN: 45 mg/dL — ABNORMAL HIGH (ref 8–23)
BUN: 46 mg/dL — ABNORMAL HIGH (ref 8–23)
CO2: 23 mmol/L (ref 22–32)
CO2: 19 mmol/L — ABNORMAL LOW (ref 22–32)
Calcium: 6.1 mg/dL — CL (ref 8.9–10.3)
Calcium: 6.1 mg/dL — CL (ref 8.9–10.3)
Chloride: 102 mmol/L (ref 98–111)
Chloride: 102 mmol/L (ref 98–111)
Creatinine, Ser: 5.54 mg/dL — ABNORMAL HIGH (ref 0.44–1.00)
Creatinine, Ser: 6.09 mg/dL — ABNORMAL HIGH (ref 0.44–1.00)
GFR calc Af Amer: 8 mL/min — ABNORMAL LOW (ref >60)
GFR calc Af Amer: 7 mL/min — ABNORMAL LOW (ref >60)
GFR calc non Af Amer: 7 mL/min — ABNORMAL LOW (ref >60)
GFR calc non Af Amer: 6 mL/min — ABNORMAL LOW (ref >60)
Glucose, Bld: 126 mg/dL — ABNORMAL HIGH (ref 70–99)
Glucose, Bld: 157 mg/dL — ABNORMAL HIGH (ref 70–99)
Potassium: 3.3 mmol/L — ABNORMAL LOW (ref 3.5–5.1)
Potassium: 3.5 mmol/L (ref 3.5–5.1)
Sodium: 134 mmol/L — ABNORMAL LOW (ref 135–145)
Sodium: 132 mmol/L — ABNORMAL LOW (ref 135–145)

## 2019-04-04 LAB — GLUCOSE, CAPILLARY: Glucose-Capillary: 150 mg/dL — ABNORMAL HIGH (ref 70–99)

## 2019-04-04 LAB — CULTURE, BLOOD (ROUTINE X 2)
Culture: NO GROWTH
Culture: NO GROWTH
Special Requests: ADEQUATE

## 2019-04-04 LAB — MAGNESIUM: Magnesium: 1.5 mg/dL — ABNORMAL LOW (ref 1.7–2.4)

## 2019-04-04 LAB — PREPARE RBC (CROSSMATCH)

## 2019-04-04 LAB — PHOSPHORUS: Phosphorus: 4.3 mg/dL (ref 2.5–4.6)

## 2019-04-04 MED ORDER — METOPROLOL TARTRATE 25 MG PO TABS
12.5000 mg | ORAL_TABLET | Freq: Two times a day (BID) | ORAL | Status: DC
  Administered 2019-04-04: 12.5 mg via ORAL
  Filled 2019-04-04 (×3): qty 1

## 2019-04-04 MED ORDER — MAGNESIUM SULFATE 2 GM/50ML IV SOLN
2.0000 g | Freq: Once | INTRAVENOUS | Status: AC
  Administered 2019-04-04: 2 g via INTRAVENOUS
  Filled 2019-04-04: qty 50

## 2019-04-04 MED ORDER — EPOETIN ALFA 10000 UNIT/ML IJ SOLN
10000.0000 [IU] | INTRAMUSCULAR | Status: DC
  Administered 2019-04-04: 10000 [IU] via INTRAVENOUS

## 2019-04-04 MED ORDER — SODIUM CHLORIDE 0.9 % IV BOLUS
250.0000 mL | Freq: Once | INTRAVENOUS | Status: AC
  Administered 2019-04-04: 250 mL via INTRAVENOUS

## 2019-04-04 NOTE — Progress Notes (Signed)
PT Cancellation Note  Patient Details Name: ADISON REIFSTECK MRN: [id number] DOB: June 26, 1941   Cancelled Treatment:    Reason Eval/Treat Not Completed: Patient at procedure or test/unavailable- patient at dialysis this morning. Will re-attempt this pm if labs are improved. Patient has calcium level of 6.1 which is contraindicated for PT.    Adiva Boettner 04/04/2019, 9:29 AM

## 2019-04-04 NOTE — TOC Initial Note (Signed)
Transition of Care [facility]) - Initial/Assessment Note    Patient Details  Name: [name] MRN: [id number] Date of Birth: 1941-08-25  [facility] CM/SW Contact:    Candie Chroman, LCSW Phone Number: 04/04/2019, 4:05 PM  Clinical Narrative: Readmission prevention screen complete. Patient confirmed she receives home health services through [facility]. She has a cane, walker, wheelchair, and bedside commode at home. Her daughter takes her to appts. She has no concerns about getting medications. No further concerns. CSW encouraged patient to contact CSW as needed. CSW will continue to follow patient for support and facilitate return home once stable.         Expected Discharge Plan: [place] Barriers to Discharge: Continued Medical Work up   Patient Goals and CMS Choice     Choice offered to / list presented to : NA  Expected Discharge Plan and Services Expected Discharge Plan: [place] Choice: [place] arrangements for the past 2 months: Single Family Home Expected Discharge Date: 04/02/19                         HH Arranged: RN, PT, OT, Nurse's Aide [place] Agency: [place] ([place]) Date HH Agency Contacted: 03/31/19   Representative spoke with at [place]: Corene Cornea  Prior Living Arrangements/Services Living arrangements for the past 2 months: [place]   Patient language and need for interpreter reviewed:: Yes Do you feel safe going back to the place where you live?: Yes      Need for Family Participation in Patient Care: Yes (Comment) Care giver support system in place?: Yes (comment) Current home services: DME, Homehealth aide, Home OT, Home PT, Home RN Criminal Activity/Legal Involvement Pertinent to Current Situation/Hospitalization: No - Comment as needed  Activities of Daily Living Home Assistive Devices/Equipment: None ADL Screening (condition at time of  admission) Patient's cognitive ability adequate to safely complete daily activities?: Yes Is the patient deaf or have difficulty hearing?: No Does the patient have difficulty seeing, even when wearing glasses/contacts?: No Does the patient have difficulty concentrating, remembering, or making decisions?: No Patient able to express need for assistance with ADLs?: Yes Does the patient have difficulty dressing or bathing?: Yes Independently performs ADLs?: No Communication: Dependent Is this a change from baseline?: Pre-admission baseline Dressing (OT): Dependent Is this a change from baseline?: Pre-admission baseline Grooming: Dependent Feeding: Dependent Is this a change from baseline?: Pre-admission baseline Bathing: Dependent Is this a change from baseline?: Pre-admission baseline Toileting: Dependent Is this a change from baseline?: Pre-admission baseline In/Out Bed: Dependent Is this a change from baseline?: Pre-admission baseline Walks in Home: Dependent Is this a change from baseline?: Pre-admission baseline Does the patient have difficulty walking or climbing stairs?: Yes Weakness of Legs: Both Weakness of Arms/Hands: Both  Permission Sought/Granted Permission sought to share information with : Facility Art therapist granted to share information with : Yes, Verbal Permission Granted     Permission granted to share info w AGENCY: [facility]        Emotional Assessment Appearance:: Appears stated age Attitude/Demeanor/Rapport: Engaged, Gracious Affect (typically observed): Accepting, Appropriate, Calm, Pleasant Orientation: : Oriented to Self, Oriented to Place, Oriented to  Time, Oriented to Situation Alcohol / Substance Use: Never Used Psych Involvement: No (comment)  Admission diagnosis:  Generalized abdominal pain [R10.84] Acute renal failure superimposed on chronic kidney disease, unspecified  CKD stage, unspecified acute renal failure  type ([place]) [N17.9, N18.9] Patient Active Problem List   Diagnosis Date Noted  . PAF (paroxysmal atrial fibrillation) ([place]) 04/01/2019  . Abdominal pain 03/30/2019  . Goals of care, counseling/discussion   . Palliative care by specialist   . ESRD (end stage renal disease) on dialysis ([place])   . Other cirrhosis of liver ([place])   . Lung nodule   . Anemia due to chronic kidney disease, on chronic dialysis ([place])   . Hyperkalemia 03/17/2019  . Abnormal thyroid blood test 11/07/2018  . Uncontrolled type 2 diabetes mellitus with hyperglycemia ([place]) 07/25/2018  . Hyperthyroidism 07/05/2018  . Melena 10/08/2013  . Cardiomyopathy ([place]) 07/12/2013  . Physical deconditioning 07/06/2013  . NSTEMI (non-ST elevated myocardial infarction) ([place]) 07/02/2013  . Community acquired pneumonia 07/02/2013  . Sepsis(995.91) 07/02/2013  . Acute respiratory failure ([place]) 07/02/2013  . Acute systolic CHF (congestive heart failure) ([place]) 07/02/2013  . 10 year risk of MI or stroke 7.5% or greater   . HEMOCCULT POSITIVE STOOL 05/15/2009  . ANEMIA, IRON DEFICIENCY, HX OF 05/15/2009  . DM 05/14/2009  . HYPERCHOLESTEROLEMIA 05/14/2009  . MIGRAINE HEADACHE 05/14/2009  . HYPERTENSION 05/14/2009  . GERD 05/14/2009  . CHOLELITHIASIS 05/14/2009  . NEPHROLITHIASIS 05/14/2009  . BURSITIS 05/14/2009   PCP:  Caryl Bis, MD Pharmacy:   [place], [place] [address] Phone: [phone] Fax: [phone]     Social Determinants of Health (SDOH) Interventions    Readmission Risk Interventions Readmission Risk Prevention Plan 04/04/2019 03/18/2019  Transportation Screening Complete Complete  Medication Review Press photographer) Complete -  [place] or Home Care Consult Complete -  SW Recovery Care/Counseling Consult Complete -  Palliative Care Screening Not Applicable -  [place] Not Applicable -  Some recent data might be hidden

## 2019-04-04 NOTE — Progress Notes (Addendum)
HD Tx Initiated: Increased resistance observed in arterial line, however, able to flush line with little to no resistance; line positional.    04/04/19 0906  Vital Signs  Temp 98.5 F (36.9 C)  Temp Source Oral  Pulse Rate 71  Pulse Rate Source Monitor  Resp 16  BP (!) 92/55  BP Location Left Arm  BP Method Automatic  Patient Position (if appropriate) Lying  Oxygen Therapy  SpO2 99 %  O2 Device Room Air  Pain Assessment  Pain Scale 0-10  Pain Score 0  Dialysis Weight  Weight 47.9 kg  Type of Weight Pre-Dialysis  During Hemodialysis Assessment  Blood Flow Rate (mL/min) 300 mL/min  Arterial Pressure (mmHg) -80 mmHg  Venous Pressure (mmHg) 60 mmHg  Transmembrane Pressure (mmHg) 50 mmHg  Ultrafiltration Rate (mL/min) 10 mL/min  Dialysate Flow Rate (mL/min) 600 ml/min  Conductivity: Machine  14.3  HD Safety Checks Performed Yes  Intra-Hemodialysis Comments Tx initiated

## 2019-04-04 NOTE — Progress Notes (Signed)
Pre HD Tx   04/04/19 0900  Vital Signs  Temp 98.5 F (36.9 C)  Temp Source Oral  Pulse Rate 89  Pulse Rate Source Monitor  Resp 20  BP (!) 92/55  BP Location Left Arm  BP Method Automatic  Patient Position (if appropriate) Sitting  Oxygen Therapy  SpO2 99 %  O2 Device Room Air  Pain Assessment  Pain Scale 0-10  Pain Score 0  Dialysis Weight  Weight 47.9 kg  Type of Weight Pre-Dialysis  Time-Out for Hemodialysis  What Procedure? HD  Pt Identifiers(min of two) First/Last Name;MRN/Account#;Pt's DOB(use if MRN/Acct# not available  Correct Site? Yes  Correct Side? Yes  Correct Procedure? Yes  Consents Verified? Yes  Rad Studies Available? N/A  Safety Precautions Reviewed? Yes  Engineer, civil (consulting) Number 4  Station Number 4  UF/Alarm Test Passed  Conductivity: Meter 13.8  Conductivity: Machine  14.3  pH 7.6  Reverse Osmosis main  Normal Saline Lot Number [id number]  Dialyzer Lot Number [id number]  Disposable Set Lot Number [id number]  Machine Temperature 98.6 F (37 C)  Musician and Audible Yes  Blood Lines Intact and Secured Yes  Pre Treatment Patient Checks  Vascular access used during treatment Catheter  HD catheter dressing before treatment WDL  Patient is receiving dialysis in a chair  (n/a )  Hepatitis B Surface Antigen Results Negative  Date Hepatitis B Surface Antigen Drawn 04/02/19  Isolation Initiated  (none)  Hepatitis B Surface Antibody  (<1)  Date Hepatitis B Surface Antibody Drawn 04/02/19  Hemodialysis Consent Verified Yes  Hemodialysis Standing Orders Initiated Yes  ECG (Telemetry) Monitor On Yes  Prime Ordered Normal Saline  Length of  DialysisTreatment -hour(s) 3 Hour(s)  Dialysis Treatment Comments  (Na 140)  Dialyzer Elisio 17H NR  Dialysate 3K;2.5 Ca  Dialysate Flow Ordered 600  Blood Flow Rate Ordered 300 mL/min  Ultrafiltration Goal 0 Liters  Dialysis Blood Pressure Support Ordered Normal Saline  Education / Care Plan   Dialysis Education Provided Yes  Documented Education in Care Plan Yes  Outpatient Plan of Care Reviewed and on Chart Yes  Hemodialysis Catheter Right Subclavian Double lumen Permanent (Tunneled)  Placement Date/Time: (c) 04/02/19 1411   Placed prior to admission: Yes  Time Out: Correct patient;Correct site;Correct procedure  Maximum sterile barrier precautions: Hand hygiene;Cap;Mask;Sterile gown  Site Prep: Chlorhexidine (preferred)  Ultrasoun...  Site Condition Other (Comment) (Arterial resistance)  Blue Lumen Status Blood return noted  Red Lumen Status No blood return;Other (Comment) (able to flush without resistance)  Purple Lumen Status N/A  Dressing Type Biopatch;Occlusive  Dressing Status Intact;Clean;Dry  Drainage Description None

## 2019-04-04 NOTE — Progress Notes (Signed)
HD Tx Completed:    04/04/19 1206  Vital Signs  Temp 98.3 F (36.8 C)  Temp Source Oral  Pulse Rate 87  Pulse Rate Source Monitor  Resp 16  BP (!) 101/57  BP Location Right Arm  BP Method Automatic  Patient Position (if appropriate) Lying  Oxygen Therapy  SpO2 99 %  O2 Device Room Air  Pain Assessment  Pain Scale 0-10  Pain Score 0  During Hemodialysis Assessment  Blood Flow Rate (mL/min) 300 mL/min  Arterial Pressure (mmHg) -100 mmHg  Venous Pressure (mmHg) 70 mmHg  Transmembrane Pressure (mmHg) 40 mmHg  Ultrafiltration Rate (mL/min) 10 mL/min  Dialysate Flow Rate (mL/min) 600 ml/min  Conductivity: Machine  14.2  HD Safety Checks Performed Yes  Intra-Hemodialysis Comments Tx completed

## 2019-04-04 NOTE — Progress Notes (Signed)
Post HD Assessment:    04/04/19 1215  Neurological  Level of Consciousness Alert  Orientation Level Oriented X4  Respiratory  Respiratory Pattern Regular;Unlabored  Chest Assessment Chest expansion symmetrical  Bilateral Breath Sounds Diminished  Cardiac  Pulse Regular  Heart Sounds S1, S2  Vascular  R Radial Pulse +2  L Radial Pulse +2  Psychosocial  Psychosocial (WDL) WDL

## 2019-04-04 NOTE — Progress Notes (Signed)
Notified angela seals of pt low b/p. Acknowledged and labs placed. Cont to monitor

## 2019-04-04 NOTE — Plan of Care (Signed)
  Problem: Education: Goal: Knowledge of General Education information will improve Description: Including pain rating scale, medication(s)/side effects and non-pharmacologic comfort measures Outcome: Progressing Pt understand the nature behind her hospitalization.  She has no further questions at the moment. Will continue to  offer support PRN.   Problem: Health Behavior/Discharge Planning: Goal: Ability to manage health-related needs will improve Outcome: Progressing Pt understand her POC and expectations during this hospitalization.   Problem: Clinical Measurements: Goal: Ability to maintain clinical measurements within normal limits will improve Outcome: Progressing Pt's has been hypotensive this admission.  Stark Jock, MD notified on every occasion.  She has midodrine ordered per MD's orders.  Will continue to monitor and assess.   Problem: Clinical Measurements: Goal: Will remain free from infection Outcome: Progressing S/Sx of infection monitor and assessed q-shift/ PRN.  Pt has been afebrile thus far.  Will continue to monitor and assess.   Problem: Clinical Measurements: Goal: Respiratory complications will improve Outcome: Progressing Respiratory status monitored and assessed q-shift/ PRN, along with VS.  Pt is on RA with O2 saturations at 97-100% and respiration rate of 12-18 breaths per minute.  Pt has not endorsed SOB or DOE. Will continue to monitor and assess.   Problem: Clinical Measurements: Goal: Cardiovascular complication will be avoided Outcome: Not Progressing Pt's has been hypotensive this admission.  Stark Jock, MD notified on every occasion.  She has midodrine ordered per MD's orders.  Will continue to monitor and assess.  Problem: Activity: Goal: Risk for activity intolerance will decrease Outcome: Progressing  Pt is able to turn herself in the bed.  She is x1 assist to the [facility].   Problem: Nutrition: Goal: Adequate nutrition will be maintained Outcome:  Progressing Pt is on a Carb Modified diet.  She was able to tolerate 100% of her meals this shift.    Problem: Coping: Goal: Level of anxiety will decrease Outcome: Progressing Pt has not expressed c/o anxiety r/t her hospitalization.   Problem: Elimination: Goal: Will not experience complications related to bowel motility Outcome: Progressing Pt was incontinent of her bowels this shift.  She does not endorse c/o constipation.   Problem: Elimination: Goal: Will not experience complications related to urinary retention Outcome: Progressing Pt is a dialysis pt and is oliguria.  Problem: Pain Management: Goal: General experience of comfort will improve Outcome: Progressing Pt has endorsed 3/10 mid abdominal pain describing it as an intermittent pressure.  Reiterate pain scale so she could adequately rate her pain.  Pt stated her pain goal this admission would be 0/10.  Discussed nonpharmacological methods to help reduce s/sx of pain.  Interventions given per pt's request and MD's orders.    Problem: Safety: Goal: Ability to remain free from injury will improve Outcome: Progressing Instructed pt to utilize RN call light for assistance.  Hourly rounds performed.  Bed alarm implemented to keep pt safe from falls.  Settings set to third most sensitive mode.  Bed in lowest position, locked with two upper/ one lower side rail engaged.  Belongings and call light within reach.   Problem: Skin Integrity: Goal: Risk for impaired skin integrity will decrease Outcome: Progressing Skin impairment monitored and assessed q-shift/ PRN.  Instructed pt to turn q2 hours to prevent further skin impairment.  Tubes and drains assessed for device relate pressure sores.  Will continue to monitor and assess.

## 2019-04-04 NOTE — Telephone Encounter (Signed)
10:30am I returned a TC to PCG daughter Blanch Media [phone]) who updated me as to her mother's hospital admission. We rescheduled the family meeting to 04/10/2019 @ 6pm   Blanch Media shared that she had hand delivered patient's Medicaid application to [place] office. That application could possibly be expedited d/t patient's frequency of hospital admission.    Violeta Gelinas NP-C [phone]

## 2019-04-04 NOTE — Progress Notes (Signed)
[facility]  ROUNDING NOTE   Subjective:   Seen and examined on hemodialysis dialysis treatment today. Tolerating treatment well. Back up hemodialysis today.   PD treatment yesterday. UF of 1633mL.     HEMODIALYSIS FLOWSHEET:  Blood Flow Rate (mL/min): 300 mL/min Arterial Pressure (mmHg): -70 mmHg Venous Pressure (mmHg): 60 mmHg Transmembrane Pressure (mmHg): 50 mmHg Ultrafiltration Rate (mL/min): 10 mL/min Dialysate Flow Rate (mL/min): 500 ml/min Conductivity: Machine : 14 Conductivity: Machine : 14 Dialysis Fluid Bolus: Normal Saline Bolus Amount (mL): 300 mL    Objective:  Vital signs in last 24 hours:  Temp:  [98 F (36.7 C)-99.6 F (37.6 C)] 98.2 F (36.8 C) (08/05 0815) Pulse Rate:  [71-91] 71 (08/05 0815) Resp:  [18] 18 (08/05 0815) BP: (79-103)/(40-59) 103/59 (08/05 0815) SpO2:  [99 %-100 %] 99 % (08/05 0815)  Weight change:  Filed Weights   03/30/19 1807 04/02/19 0438 04/02/19 1310  Weight: 42.2 kg 40.3 kg 40.3 kg    Intake/Output: I/O last 3 completed shifts: In: 10761.9 [P.O.:960; I.V.:3202.5; Other:6000; IV Piggyback:599.4] Out: 5701 [Other:1674]   Intake/Output this shift:  No intake/output data recorded.  Physical Exam: General: NAD, laying in bed  Head: Moist oral mucosal membranes  Eyes: Anicteric, PERRL  Neck: Supple, trachea midline  Lungs:  Clear to auscultation  Heart: Regular rate and rhythm  Abdomen:  Soft, nontender, +ascites  Extremities: no peripheral edema.  Neurologic: Nonfocal, moving all four extremities  Skin: No lesions  Access: Right lower quadrant PD catheter, RIJ permcath    Basic Metabolic Panel: Recent Labs  Lab 03/30/19 2327 03/31/19 0538 03/31/19 1043 04/01/19 0625 04/02/19 0451 04/03/19 0430 04/04/19 0155  NA  --  124*  --  126* 124* 134* 132*  K  --  4.7  --  3.6 3.3* 3.3* 3.5  CL  --  89*  --  89* 89* 102 102  CO2  --  14*  --  19* 16* 23 19*  GLUCOSE  --  87  --  93 154* 126* 157*   BUN  --  129*  --  116* 106* 45* 46*  CREATININE  --  12.32*  --  11.26* 10.61* 5.54* 6.09*  CALCIUM  --  6.8*  --  6.2* 6.0* 6.1* 6.1*  MG 1.7  --   --  1.6* 1.5* 1.5* 1.5*  PHOS  --   --  14.4* 11.7* 10.4* 4.9* 4.3    Liver Function Tests: Recent Labs  Lab 03/30/19 1221 03/30/19 1813  AST 30 30  ALT 37 37  ALKPHOS 137* 160*  BILITOT 0.8 0.8  PROT 5.0* 5.1*  ALBUMIN 2.3* 2.5*   Recent Labs  Lab 03/30/19 1813  LIPASE 97*   No results for input(s): AMMONIA in the last 168 hours.  CBC: Recent Labs  Lab 03/30/19 1221  03/31/19 0538 04/01/19 0625 04/02/19 0451 04/03/19 0430 04/04/19 0155  WBC 15.9*   < > 19.6* 20.1* 13.2* 10.1 13.6*  NEUTROABS 11.9*  --   --   --   --   --   --   HGB 7.5*   < > 6.7* 8.7* 8.2* 7.3* 7.2*  HCT 22.7*   < > 19.9* 24.4* 23.8* 21.7* 21.9*  MCV 86.0   < > 83.6 82.2 83.5 86.1 87.3  PLT 193   < > 189 162 109* 90* 108*   < > = values in this interval not displayed.    Cardiac Enzymes: No results for input(s): CKTOTAL, CKMB,  CKMBINDEX, TROPONINI in the last 168 hours.  BNP: Invalid input(s): POCBNP  CBG: Recent Labs  Lab 04/02/19 1317 04/02/19 1426 04/04/19 0739  GLUCAP 107* 122* 150*    Microbiology: Results for orders placed or performed during the hospital encounter of 03/30/19  Blood Culture (routine x 2)     Status: None   Collection Time: 03/30/19  7:41 PM   Specimen: BLOOD  Result Value Ref Range Status   Specimen Description BLOOD LEFT ANTECUBITAL  Final   Special Requests   Final    BOTTLES DRAWN AEROBIC AND ANAEROBIC Blood Culture adequate volume   Culture   Final    NO GROWTH 5 DAYS Performed at [facility], [address] [place], [place] [address]    Report Status 04/04/2019 FINAL  Final  Blood Culture (routine x 2)     Status: None   Collection Time: 03/30/19  7:42 PM   Specimen: BLOOD  Result Value Ref Range Status   Specimen Description BLOOD RIGHT ANTECUBITAL  Final   Special Requests    Final    BOTTLES DRAWN AEROBIC AND ANAEROBIC Blood Culture results may not be optimal due to an excessive volume of blood received in culture bottles   Culture   Final    NO GROWTH 5 DAYS Performed at [facility], [address] [place],  [address]    Report Status 04/04/2019 FINAL  Final  SARS Coronavirus 2 [facility] order, Performed in [place] hospital lab)     Status: None   Collection Time: 03/30/19  7:42 PM  Result Value Ref Range Status   SARS Coronavirus 2 NEGATIVE NEGATIVE Final    Comment: (NOTE) If result is NEGATIVE SARS-CoV-2 target nucleic acids are NOT DETECTED. The SARS-CoV-2 RNA is generally detectable in upper and lower  respiratory specimens during the acute phase of infection. The lowest  concentration of SARS-CoV-2 viral copies this assay can detect is 250  copies / mL. A negative result does not preclude SARS-CoV-2 infection  and should not be used as the sole basis for treatment or other  patient management decisions.  A negative result may occur with  improper specimen collection / handling, submission of specimen other  than nasopharyngeal swab, presence of viral mutation(s) within the  areas targeted by this assay, and inadequate number of viral copies  (<250 copies / mL). A negative result must be combined with clinical  observations, patient history, and epidemiological information. If result is POSITIVE SARS-CoV-2 target nucleic acids are DETECTED. The SARS-CoV-2 RNA is generally detectable in upper and lower  respiratory specimens dur ing the acute phase of infection.  Positive  results are indicative of active infection with SARS-CoV-2.  Clinical  correlation with patient history and other diagnostic information is  necessary to determine patient infection status.  Positive results do  not rule out bacterial infection or co-infection with other viruses. If result is PRESUMPTIVE POSTIVE SARS-CoV-2 nucleic acids MAY BE PRESENT.    A presumptive positive result was obtained on the submitted specimen  and confirmed on repeat testing.  While 2019 novel coronavirus  (SARS-CoV-2) nucleic acids may be present in the submitted sample  additional confirmatory testing may be necessary for epidemiological  and / or clinical management purposes  to differentiate between  SARS-CoV-2 and other Sarbecovirus currently known to infect humans.  If clinically indicated additional testing with an alternate test  methodology [phone]) is advised. The SARS-CoV-2 RNA is generally  detectable in upper and lower respiratory  sp ecimens during the acute  phase of infection. The expected result is Negative. Fact Sheet for Patients:  StrictlyIdeas.no Fact Sheet for Healthcare Providers: BankingDealers.co.za This test is not yet approved or cleared by the [place] FDA and has been authorized for detection and/or diagnosis of SARS-CoV-2 by FDA under an Emergency Use Authorization (EUA).  This EUA will remain in effect (meaning this test can be used) for the duration of the COVID-19 declaration under Section 564(b)(1) of the Act, 21 U.S.C. section 360bbb-3(b)(1), unless the authorization is terminated or revoked sooner. Performed at [facility], [address] [place], [place] [address]   Gastrointestinal Panel by PCR , Stool     Status: None   Collection Time: 03/30/19 11:36 PM   Specimen: Stool  Result Value Ref Range Status   Campylobacter species NOT DETECTED NOT DETECTED Final   Plesimonas shigelloides NOT DETECTED NOT DETECTED Final   Salmonella species NOT DETECTED NOT DETECTED Final   Yersinia enterocolitica NOT DETECTED NOT DETECTED Final   Vibrio species NOT DETECTED NOT DETECTED Final   Vibrio cholerae NOT DETECTED NOT DETECTED Final   Enteroaggregative E coli (EAEC) NOT DETECTED NOT DETECTED Final   Enteropathogenic E coli (EPEC) NOT DETECTED NOT DETECTED Final    Enterotoxigenic E coli (ETEC) NOT DETECTED NOT DETECTED Final   Shiga like toxin producing E coli (STEC) NOT DETECTED NOT DETECTED Final   Shigella/Enteroinvasive E coli (EIEC) NOT DETECTED NOT DETECTED Final   Cryptosporidium NOT DETECTED NOT DETECTED Final   Cyclospora cayetanensis NOT DETECTED NOT DETECTED Final   Entamoeba histolytica NOT DETECTED NOT DETECTED Final   Giardia lamblia NOT DETECTED NOT DETECTED Final   Adenovirus F40/41 NOT DETECTED NOT DETECTED Final   Astrovirus NOT DETECTED NOT DETECTED Final   Norovirus GI/GII NOT DETECTED NOT DETECTED Final   Rotavirus A NOT DETECTED NOT DETECTED Final   Sapovirus (I, II, IV, and V) NOT DETECTED NOT DETECTED Final    Comment: Performed at [facility], [address] [place], [place] [address]  Body fluid culture     Status: None (Preliminary result)   Collection Time: 03/31/19  1:11 PM   Specimen: Ascitic; Body Fluid  Result Value Ref Range Status   Specimen Description   Final    ASCITIC Performed at [facility], [address] [place], [place] [address]    Special Requests   Final    Immunocompromised Performed at [facility], [place], [place] [address]    Gram Stain   Final    MODERATE WBC PRESENT,BOTH PMN AND MONONUCLEAR NO ORGANISMS SEEN    Culture   Final    NO GROWTH 3 DAYS Performed at [facility], [place] [address], [place], [place] [address]    Report Status PENDING  Incomplete    Coagulation Studies: No results for input(s): LABPROT, INR in the last 72 hours.  Urinalysis: No results for input(s): COLORURINE, LABSPEC, PHURINE, GLUCOSEU, HGBUR, BILIRUBINUR, KETONESUR, PROTEINUR, UROBILINOGEN, NITRITE, LEUKOCYTESUR in the last 72 hours.  Invalid input(s): APPERANCEUR    Imaging: No results found.   Medications:   . sodium chloride Stopped (04/03/19 1402)  . 0.9 % NaCl with KCl 20 mEq / L Stopped (04/04/19 0406)  . ciprofloxacin Stopped  (04/03/19 2254)  . dialysis solution 1.5% low-MG/low-CA    . metronidazole 100 mL/hr at 04/04/19 0450   . buPROPion  100 mg Oral Daily  . calcium-vitamin D  1 tablet Oral TID  .  Chlorhexidine Gluconate Cloth  6 each Topical Q0600  . cholecalciferol  2,000 Units Oral Daily  . feeding supplement (PRO-STAT SUGAR FREE 64)  30 mL Oral BID  . FLUoxetine  40 mg Oral Daily  . gentamicin cream  1 application Topical Daily  . midodrine  10 mg Oral TID PC  . multivitamin  1 tablet Oral QHS  . multivitamin-lutein  1 capsule Oral Daily  . nystatin  5 mL Mouth/Throat QID  . pantoprazole  40 mg Oral BID  . sodium chloride flush  3 mL Intravenous Q12H  . traZODone  100 mg Oral QHS  . vitamin B-12  500 mcg Oral Daily   sodium chloride, acetaminophen **OR** acetaminophen, HYDROmorphone (DILAUDID) injection, loperamide, ondansetron (ZOFRAN) IV, sodium chloride flush  Assessment/ Plan:  [name] is a 78 y.o. 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   [facility] Peritoneal dialysis CCPD 1.5 Liters 4 exchanges.   1. End-stage renal disease with uremia 2. Hyponatremia 3. Metabolic acidosis 4. Anemia of chronic kidney disease status post 1 unit PRBC on 8/1 5. Secondary Hyperparathyroidism with Hypocalcemia and hyperphosphatemia. Currently on calcium acetate 6. Hypotension: on midodrine 10mg  tid  Plan Seen and examined on hemodialysis dialysis. Back up hemodialysis.  Peritoneal dialysis with chylous ascites. No signs or symptoms of peritonitis. Required back up hemodialysis for Monday and Today. Tolerated treatment well.  - EPO with HD treatment.     LOS: 5 Romona Murdy 8/5/20209:27 AM

## 2019-04-04 NOTE — Progress Notes (Signed)
[place] NAME: [name]    MR#:  [id number]  DATE OF BIRTH:  12-Aug-1941  SUBJECTIVE:  CHIEF COMPLAINT:   Chief Complaint  Patient presents with  . Abdominal Pain   No new complaint this morning.  Patient currently having hemodialysis this morning.  No abdominal pains.  No diarrhea.  REVIEW OF SYSTEMS:  Review of Systems  Constitutional: Negative for chills and fever.  HENT: Negative for hearing loss and tinnitus.   Eyes: Negative for blurred vision and double vision.  Respiratory: Negative for cough and shortness of breath.   Cardiovascular: Negative for chest pain and palpitations.  Gastrointestinal: Negative for abdominal pain, heartburn, nausea and vomiting.  Genitourinary: Negative for dysuria and urgency.  Musculoskeletal: Negative for myalgias and neck pain.  Skin: Negative for itching and rash.  Neurological: Negative for dizziness and headaches.  Psychiatric/Behavioral: Negative for depression and hallucinations.    DRUG ALLERGIES:   Allergies  Allergen Reactions  . Codeine Other (See Comments)    Reaction unknown    VITALS:  Blood pressure 97/62, pulse 91, temperature 98.7 F (37.1 C), temperature source Oral, resp. rate 18, height 5\' 2"  (1.575 m), weight 47.9 kg, SpO2 100 %. PHYSICAL EXAMINATION:  Physical Exam  GENERAL:  78 y.o.-year-old patient lying in the bed with no acute distress.  EYES: Pupils equal, round, reactive to light and accommodation. No scleral icterus. Extraocular muscles intact.  HEENT: Head atraumatic, normocephalic. Oropharynx and nasopharynx clear.  NECK:  Supple, no jugular venous distention. No thyroid enlargement, no tenderness.  LUNGS: Normal breath sounds bilaterally, no wheezing, rales,rhonchi or crepitation. No use of accessory muscles of respiration.  CARDIOVASCULAR: S1, S2 normal. No murmurs, rubs, or gallops.  ABDOMEN: Soft, lower abdominal tenderness PD catheter  in place, nondistended. Bowel sounds present. No rebound or guarding  EXTREMITIES: No pedal edema, cyanosis, or clubbing.  NEUROLOGIC: Cranial nerves II through XII are intact. Muscle strength 5/5 in all extremities. Sensation intact. Gait not checked.  PSYCHIATRIC: The patient is currently sleepy  sKIN: No obvious rash, lesion, or ulcer.   LABORATORY PANEL:  Female CBC Recent Labs  Lab 04/04/19 0155  WBC 13.6*  HGB 7.2*  HCT 21.9*  PLT 108*   ------------------------------------------------------------------------------------------------------------------ Chemistries  Recent Labs  Lab 03/30/19 1813  04/04/19 0155  NA 124*   < > 132*  K 4.9   < > 3.5  CL 87*   < > 102  CO2 16*   < > 19*  GLUCOSE 135*   < > 157*  BUN 117*   < > 46*  CREATININE 12.10*   < > 6.09*  CALCIUM 6.2*   < > 6.1*  MG  --    < > 1.5*  AST 30  --   --   ALT 37  --   --   ALKPHOS 160*  --   --   BILITOT 0.8  --   --    < > = values in this interval not displayed.   RADIOLOGY:  No results found. ASSESSMENT AND PLAN:   Patient 78 year old white female who is presenting with abdominal pain and diarrhea  1.   Acute colitis involving the transverse colon and ascending colon Found on CT scan.  Patient presented with abdominal pain and diarrhea, C. difficile negative.  GI panel negative. Noticed improvement with IV antibiotics with Zosyn.  Abdominal pains and diarrhea improved.  However due to worsening thrombocytopenia Zosyn was  discontinued previously and started on IV ciprofloxacin and Flagyl. Also noted incidental findings on CT scan including cirrhosis with sequelae of portal hypertension as well as complex bilateral renal masses not well visualized.  Follow-up with contrast-enhanced outpatient renal mass protocol recommended.  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  Follow-up with non-emergent outpatient ultrasound also recommended.  Prior diagnosis of lung mass. Patient was supposed to follow-up  with pulmonologist as outpatient for further evaluation to include bronchoscopy with possible biopsy.  I talked with Dr. Lillard Anes during this admission who will make arrangements for outpatient follow-up post discharge from the hospital.  2.  End-stage renal disease  Patient missed peritoneal dialysis prior to admission. Patient had permacath replaced on zero 8/0 11/17/2018 and subsequently had hemodialysis same day.  Patient had peritoneal dialysis yesterday.  Having hemodialysis this morning. Discussed with nephrologist and plan is for continuing peritoneal dialysis on discharge from the hospital  3.  Hyponatremia due to dehydration  Patient on gentle IV fluid hydration.  Sodium level of 132 this morning with hemodialysis Hypomagnesemia; replace and follow-up on repeat levels in a.m.  4.  Hypotension continue midodrine. Increased rate of IV fluids this morning.  Monitor  5.  Diabetes type 2 we will place on sliding scale insulin currently not on any medications at home  6.  Depression anxiety continue Wellbutrin and trazodone  7.    Acute on chronic anemia.   Hemoglobin down to 6.7.  Transfused with 1 unit of packed red blood cells.  Hemoglobin noted to be 7.2 today.  Follow-up on CBC in a.m.  8.  Thrombocytopenia Platelet count down to 90.  Zosyn discontinued and platelet count improved to 108 today.  Follow-up CBC in a.m.  DVT prophylaxis; hold off on heparin due to anemia requiring packed red blood cell transfusion as well as thrombocytopenia. SCDs Physical therapy consult placed to evaluate and treat  All the records are reviewed and case discussed with Care Management/Social Worker. Management plans discussed with the patient, family and they are in agreement. I called and updated patient's daughter [name] on treatment plans yesterday .  All questions were answered and she is in agreement with the plan of care.  CODE STATUS: DNR  TOTAL TIME TAKING CARE OF THIS PATIENT:  33 minutes.   More than 50% of the time was spent in counseling/coordination of care: YES  POSSIBLE D/C IN 2 DAYS, DEPENDING ON CLINICAL CONDITION.   Alando Colleran M.D on 04/04/2019 at 2:54 PM  Between 7am to 6pm - Pager - [phone]  After 6pm go to [email or web address] - Proofreader  [facility]  Office  [phone]  CC: Primary care physician; Caryl Bis, MD  Note: This dictation was prepared with Dragon dictation along with smaller phrase technology. Any transcriptional errors that result from this process are unintentional.

## 2019-04-04 NOTE — Progress Notes (Addendum)
Pre HD Tx Assessment:  Pt denies pain and reports to MD having sore throat at MD bedside assessment, no cough or other flu-like symptoms reported/observed; Pt afebrile at this time, no other complaints.    04/04/19 0900  Neurological  Level of Consciousness Alert  Orientation Level Oriented X4  Respiratory  Respiratory Pattern Regular;Unlabored  Chest Assessment Chest expansion symmetrical  Bilateral Breath Sounds Diminished  Cardiac  Pulse Regular  Heart Sounds S1, S2  Vascular  R Radial Pulse +2  L Radial Pulse +2  Psychosocial  Psychosocial (WDL) WDL  Emotional support given Given to patient

## 2019-04-04 NOTE — Progress Notes (Signed)
Post HD Tx:    04/04/19 1215  Vital Signs  Temp 98.3 F (36.8 C)  Temp Source Oral  Pulse Rate 87  Pulse Rate Source Monitor  Resp 18  BP (!) 93/57  BP Location Right Arm  BP Method Automatic  Patient Position (if appropriate) Lying  Oxygen Therapy  SpO2 99 %  O2 Device Room Air  Pain Assessment  Pain Scale 0-10  Pain Score 0  Post-Hemodialysis Assessment  Rinseback Volume (mL) 250 mL  KECN 53 V  Dialyzer Clearance Lightly streaked  Duration of HD Treatment -hour(s) 3 hour(s)  Hemodialysis Intake (mL) 500 mL  UF Total -Machine (mL) 26 mL  Net UF (mL) -474 mL  Tolerated HD Treatment Yes  AVG/AVF Arterial Site Held (minutes)  (n/a)  AVG/AVF Venous Site Held (minutes)  (n/a)  Education / Care Plan  Dialysis Education Provided Yes  Documented Education in Care Plan Yes  Outpatient Plan of Care Reviewed and on Chart Yes  Hemodialysis Catheter Right Subclavian Double lumen Permanent (Tunneled)  Placement Date/Time: (c) 04/02/19 1411   Placed prior to admission: Yes  Time Out: Correct patient;Correct site;Correct procedure  Maximum sterile barrier precautions: Hand hygiene;Cap;Mask;Sterile gown  Site Prep: Chlorhexidine (preferred)  Ultrasoun...  Blue Lumen Status Heparin locked  Red Lumen Status Heparin locked  Purple Lumen Status N/A  Dressing Type Biopatch;Occlusive  Dressing Status New drainage  Interventions New dressing  Drainage Description None  Dressing Change Due 04/10/19  Post treatment catheter status Capped and Clamped

## 2019-04-05 ENCOUNTER — Inpatient Hospital Stay: Payer: Medicare HMO

## 2019-04-05 DIAGNOSIS — Z992 Dependence on renal dialysis: Secondary | ICD-10-CM | POA: Diagnosis not present

## 2019-04-05 DIAGNOSIS — N186 End stage renal disease: Secondary | ICD-10-CM | POA: Diagnosis not present

## 2019-04-05 LAB — CBC
HCT: 22.3 % — ABNORMAL LOW (ref 36.0–46.0)
Hemoglobin: 7.2 g/dL — ABNORMAL LOW (ref 12.0–15.0)
MCH: 28.8 pg (ref 26.0–34.0)
MCHC: 32.3 g/dL (ref 30.0–36.0)
MCV: 89.2 fL (ref 80.0–100.0)
Platelets: 120 10*3/uL — ABNORMAL LOW (ref 150–400)
RBC: 2.5 MIL/uL — ABNORMAL LOW (ref 3.87–5.11)
RDW: 16.5 % — ABNORMAL HIGH (ref 11.5–15.5)
WBC: 15.3 10*3/uL — ABNORMAL HIGH (ref 4.0–10.5)
nRBC: 0.1 % (ref 0.0–0.2)

## 2019-04-05 LAB — BASIC METABOLIC PANEL
Anion gap: 9 (ref 5–15)
BUN: 31 mg/dL — ABNORMAL HIGH (ref 8–23)
CO2: 24 mmol/L (ref 22–32)
Calcium: 7.1 mg/dL — ABNORMAL LOW (ref 8.9–10.3)
Chloride: 102 mmol/L (ref 98–111)
Creatinine, Ser: 3.88 mg/dL — ABNORMAL HIGH (ref 0.44–1.00)
GFR calc Af Amer: 12 mL/min — ABNORMAL LOW (ref >60)
GFR calc non Af Amer: 10 mL/min — ABNORMAL LOW (ref >60)
Glucose, Bld: 134 mg/dL — ABNORMAL HIGH (ref 70–99)
Potassium: 3.9 mmol/L (ref 3.5–5.1)
Sodium: 135 mmol/L (ref 135–145)

## 2019-04-05 LAB — PHOSPHORUS: Phosphorus: 2.9 mg/dL (ref 2.5–4.6)

## 2019-04-05 LAB — GLUCOSE, CAPILLARY: Glucose-Capillary: 191 mg/dL — ABNORMAL HIGH (ref 70–99)

## 2019-04-05 LAB — MAGNESIUM: Magnesium: 2.1 mg/dL (ref 1.7–2.4)

## 2019-04-05 MED ORDER — EPOETIN ALFA 40000 UNIT/ML IJ SOLN
20000.0000 [IU] | INTRAMUSCULAR | Status: DC
  Filled 2019-04-05: qty 1

## 2019-04-05 MED ORDER — SODIUM CHLORIDE 0.9 % IV BOLUS: 250.0000 mL | Freq: Once | INTRAVENOUS | Status: DC

## 2019-04-05 NOTE — Progress Notes (Signed)
[facility]  ROUNDING NOTE   Subjective:   Hypotensive this morning Feels appetite is good No leg edema Stomach feels full Getting iv fluids with Kcl    Objective:  Vital signs in last 24 hours:  Temp:  [98.3 F (36.8 C)-98.7 F (37.1 C)] 98.4 F (36.9 C) (08/06 0438) Pulse Rate:  [75-91] 75 (08/06 0438) Resp:  [12-18] 18 (08/06 0438) BP: (70-101)/(46-62) 78/46 (08/06 0500) SpO2:  [96 %-100 %] 96 % (08/06 0438)  Weight change:  Filed Weights   04/02/19 1310 04/04/19 0900 04/04/19 0906  Weight: 40.3 kg 47.9 kg 47.9 kg    Intake/Output: I/O last 3 completed shifts: In: 3231.7 [P.O.:360; I.V.:2023; IV Piggyback:848.7] Out: -474    Intake/Output this shift:  Total I/O In: 240 [P.O.:240] Out: -   Physical Exam: General: NAD, laying in bed  Head: Moist oral mucosal membranes  Eyes: Anicteric,    Neck: Supple, trachea midline  Lungs:  Clear to auscultation  Heart: Regular rate and rhythm  Abdomen:  Soft, nontender, +ascites  Extremities: no peripheral edema.  Neurologic: Nonfocal, moving all four extremities  Skin: No lesions  Access: Right lower quadrant PD catheter, RIJ permcath    Basic Metabolic Panel: Recent Labs  Lab 04/01/19 0625 04/02/19 0451 04/03/19 0430 04/04/19 0155 04/05/19 0625  NA 126* 124* 134* 132* 135  K 3.6 3.3* 3.3* 3.5 3.9  CL 89* 89* 102 102 102  CO2 19* 16* 23 19* 24  GLUCOSE 93 154* 126* 157* 134*  BUN 116* 106* 45* 46* 31*  CREATININE 11.26* 10.61* 5.54* 6.09* 3.88*  CALCIUM 6.2* 6.0* 6.1* 6.1* 7.1*  MG 1.6* 1.5* 1.5* 1.5* 2.1  PHOS 11.7* 10.4* 4.9* 4.3 2.9    Liver Function Tests: Recent Labs  Lab 03/30/19 1221 03/30/19 1813  AST 30 30  ALT 37 37  ALKPHOS 137* 160*  BILITOT 0.8 0.8  PROT 5.0* 5.1*  ALBUMIN 2.3* 2.5*   Recent Labs  Lab 03/30/19 1813  LIPASE 97*   No results for input(s): AMMONIA in the last 168 hours.  CBC: Recent Labs  Lab 03/30/19 1221  04/01/19 0625 04/02/19 0451  04/03/19 0430 04/04/19 0155 04/05/19 0625  WBC 15.9*   < > 20.1* 13.2* 10.1 13.6* 15.3*  NEUTROABS 11.9*  --   --   --   --   --   --   HGB 7.5*   < > 8.7* 8.2* 7.3* 7.2* 7.2*  HCT 22.7*   < > 24.4* 23.8* 21.7* 21.9* 22.3*  MCV 86.0   < > 82.2 83.5 86.1 87.3 89.2  PLT 193   < > 162 109* 90* 108* 120*   < > = values in this interval not displayed.    Cardiac Enzymes: No results for input(s): CKTOTAL, CKMB, CKMBINDEX, TROPONINI in the last 168 hours.  BNP: Invalid input(s): POCBNP  CBG: Recent Labs  Lab 04/02/19 1317 04/02/19 1426 04/04/19 0739  GLUCAP 107* 122* 150*    Microbiology: Results for orders placed or performed during the hospital encounter of 03/30/19  Blood Culture (routine x 2)     Status: None   Collection Time: 03/30/19  7:41 PM   Specimen: BLOOD  Result Value Ref Range Status   Specimen Description BLOOD LEFT ANTECUBITAL  Final   Special Requests   Final    BOTTLES DRAWN AEROBIC AND ANAEROBIC Blood Culture adequate volume   Culture   Final    NO GROWTH 5 DAYS Performed at [facility], [address] [place], [place] [address]    Report Status 04/04/2019 FINAL  Final  Blood Culture (routine x 2)     Status: None   Collection Time: 03/30/19  7:42 PM   Specimen: BLOOD  Result Value Ref Range Status   Specimen Description BLOOD RIGHT ANTECUBITAL  Final   Special Requests   Final    BOTTLES DRAWN AEROBIC AND ANAEROBIC Blood Culture results may not be optimal due to an excessive volume of blood received in culture bottles   Culture   Final    NO GROWTH 5 DAYS Performed at [facility], [address] [place], [place] [address]    Report Status 04/04/2019 FINAL  Final  SARS Coronavirus 2 [facility] order, Performed in [place] hospital lab)     Status: None   Collection Time: 03/30/19  7:42 PM  Result Value Ref Range Status   SARS Coronavirus 2 NEGATIVE NEGATIVE Final    Comment: (NOTE) If result is NEGATIVE SARS-CoV-2  target nucleic acids are NOT DETECTED. The SARS-CoV-2 RNA is generally detectable in upper and lower  respiratory specimens during the acute phase of infection. The lowest  concentration of SARS-CoV-2 viral copies this assay can detect is 250  copies / mL. A negative result does not preclude SARS-CoV-2 infection  and should not be used as the sole basis for treatment or other  patient management decisions.  A negative result may occur with  improper specimen collection / handling, submission of specimen other  than nasopharyngeal swab, presence of viral mutation(s) within the  areas targeted by this assay, and inadequate number of viral copies  (<250 copies / mL). A negative result must be combined with clinical  observations, patient history, and epidemiological information. If result is POSITIVE SARS-CoV-2 target nucleic acids are DETECTED. The SARS-CoV-2 RNA is generally detectable in upper and lower  respiratory specimens dur ing the acute phase of infection.  Positive  results are indicative of active infection with SARS-CoV-2.  Clinical  correlation with patient history and other diagnostic information is  necessary to determine patient infection status.  Positive results do  not rule out bacterial infection or co-infection with other viruses. If result is PRESUMPTIVE POSTIVE SARS-CoV-2 nucleic acids MAY BE PRESENT.   A presumptive positive result was obtained on the submitted specimen  and confirmed on repeat testing.  While 2019 novel coronavirus  (SARS-CoV-2) nucleic acids may be present in the submitted sample  additional confirmatory testing may be necessary for epidemiological  and / or clinical management purposes  to differentiate between  SARS-CoV-2 and other Sarbecovirus currently known to infect humans.  If clinically indicated additional testing with an alternate test  methodology [phone]) is advised. The SARS-CoV-2 RNA is generally  detectable in upper and lower  respiratory sp ecimens during the acute  phase of infection. The expected result is Negative. Fact Sheet for Patients:  StrictlyIdeas.no Fact Sheet for Healthcare Providers: BankingDealers.co.za This test is not yet approved or cleared by the [place] FDA and has been authorized for detection and/or diagnosis of SARS-CoV-2 by FDA under an Emergency Use Authorization (EUA).  This EUA will remain in effect (meaning this test can be used) for the duration of the COVID-19 declaration under Section 564(b)(1) of the Act, 21 U.S.C. section 360bbb-3(b)(1), unless the authorization is terminated or revoked sooner. Performed at [facility], [address] [place], [place] [address]   Gastrointestinal Panel by PCR , Stool     Status: None  Collection Time: 03/30/19 11:36 PM   Specimen: Stool  Result Value Ref Range Status   Campylobacter species NOT DETECTED NOT DETECTED Final   Plesimonas shigelloides NOT DETECTED NOT DETECTED Final   Salmonella species NOT DETECTED NOT DETECTED Final   Yersinia enterocolitica NOT DETECTED NOT DETECTED Final   Vibrio species NOT DETECTED NOT DETECTED Final   Vibrio cholerae NOT DETECTED NOT DETECTED Final   Enteroaggregative E coli (EAEC) NOT DETECTED NOT DETECTED Final   Enteropathogenic E coli (EPEC) NOT DETECTED NOT DETECTED Final   Enterotoxigenic E coli (ETEC) NOT DETECTED NOT DETECTED Final   Shiga like toxin producing E coli (STEC) NOT DETECTED NOT DETECTED Final   Shigella/Enteroinvasive E coli (EIEC) NOT DETECTED NOT DETECTED Final   Cryptosporidium NOT DETECTED NOT DETECTED Final   Cyclospora cayetanensis NOT DETECTED NOT DETECTED Final   Entamoeba histolytica NOT DETECTED NOT DETECTED Final   Giardia lamblia NOT DETECTED NOT DETECTED Final   Adenovirus F40/41 NOT DETECTED NOT DETECTED Final   Astrovirus NOT DETECTED NOT DETECTED Final   Norovirus GI/GII NOT DETECTED NOT DETECTED  Final   Rotavirus A NOT DETECTED NOT DETECTED Final   Sapovirus (I, II, IV, and V) NOT DETECTED NOT DETECTED Final    Comment: Performed at [facility], [address], [place], [place] [address]  Body fluid culture     Status: None   Collection Time: 03/31/19  1:11 PM   Specimen: Ascitic; Body Fluid  Result Value Ref Range Status   Specimen Description   Final    ASCITIC Performed at [facility], [address] [place], [place] [address]    Special Requests   Final    Immunocompromised Performed at [facility], [place], [place] [address]    Gram Stain   Final    MODERATE WBC PRESENT,BOTH PMN AND MONONUCLEAR NO ORGANISMS SEEN    Culture   Final    NO GROWTH 3 DAYS Performed at [facility], [place] [address], [place], [place] [address]    Report Status 04/04/2019 FINAL  Final    Coagulation Studies: No results for input(s): LABPROT, INR in the last 72 hours.  Urinalysis: No results for input(s): COLORURINE, LABSPEC, PHURINE, GLUCOSEU, HGBUR, BILIRUBINUR, KETONESUR, PROTEINUR, UROBILINOGEN, NITRITE, LEUKOCYTESUR in the last 72 hours.  Invalid input(s): APPERANCEUR    Imaging: No results found.   Medications:   . sodium chloride Stopped (04/03/19 1402)  . 0.9 % NaCl with KCl 20 mEq / L 100 mL/hr at 04/05/19 1043  . ciprofloxacin Stopped (04/04/19 2237)  . dialysis solution 1.5% low-MG/low-CA    . metronidazole 100 mL/hr at 04/05/19 0500  . sodium chloride     . buPROPion  100 mg Oral Daily  . calcium-vitamin D  1 tablet Oral TID  . Chlorhexidine Gluconate Cloth  6 each Topical Q0600  . cholecalciferol  2,000 Units Oral Daily  . epoetin (EPOGEN/PROCRIT) injection  10,000 Units Intravenous Q M,W,F-HD  . feeding supplement (PRO-STAT SUGAR FREE 64)  30 mL Oral BID  . FLUoxetine  40 mg Oral Daily  . gentamicin cream  1 application Topical Daily  . metoprolol tartrate  12.5 mg Oral BID  . midodrine  10 mg Oral TID  PC  . multivitamin  1 tablet Oral QHS  . multivitamin-lutein  1 capsule Oral Daily  . nystatin  5 mL Mouth/Throat QID  . pantoprazole  40 mg Oral BID  . sodium chloride flush  3 mL  Intravenous Q12H  . traZODone  100 mg Oral QHS  . vitamin B-12  500 mcg Oral Daily   sodium chloride, acetaminophen **OR** acetaminophen, HYDROmorphone (DILAUDID) injection, loperamide, ondansetron (ZOFRAN) IV, sodium chloride flush  Assessment/ Plan:  [name] is a 78 y.o. 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   [facility] Peritoneal dialysis CCPD 1.5 Liters 4 exchanges.   1. End-stage renal disease with uremia 2. Hyponatremia 3. Metabolic acidosis 4. Anemia of chronic kidney disease status post 1 unit PRBC on 8/1 5. Secondary Hyperparathyroidism with Hypocalcemia and hyperphosphatemia. Currently on calcium acetate 6. Hypotension: on midodrine 10mg  tid 7. Hypokalemia   Plan Continue Peritoneal dialysis with chylous ascites. No signs or symptoms of peritonitis. Required back up hemodialysis intermittently  plan for discharge when BP is better Overall feels Ok with good appetite CCPD 6 cycles with 1.5 L  Change EPO to be administered SQ on the medical floor    LOS: 6 [name] 8/6/202011:32 AM

## 2019-04-05 NOTE — Progress Notes (Signed)
[place] [facility] [place] NAME: [name]    MR#:  [id number]  DATE OF BIRTH:  03/04/1941  SUBJECTIVE:  CHIEF COMPLAINT:   Chief Complaint  Patient presents with  . Abdominal Pain   No new complaint this morning.  Patient had hemodialysis yesterday.  Scheduled for peritoneal dialysis today.  Noted blood pressure running low which is chronic but slightly lower than baseline.  Given 250 cc bolus this morning.  Patient on midodrine as well REVIEW OF SYSTEMS:  Review of Systems  Constitutional: Negative for chills and fever.  HENT: Negative for hearing loss and tinnitus.   Eyes: Negative for blurred vision and double vision.  Respiratory: Negative for cough and shortness of breath.   Cardiovascular: Negative for chest pain and palpitations.  Gastrointestinal: Negative for abdominal pain, heartburn, nausea and vomiting.  Genitourinary: Negative for dysuria and urgency.  Musculoskeletal: Negative for myalgias and neck pain.  Skin: Negative for itching and rash.  Neurological: Negative for dizziness and headaches.  Psychiatric/Behavioral: Negative for depression and hallucinations.    DRUG ALLERGIES:   Allergies  Allergen Reactions  . Codeine Other (See Comments)    Reaction unknown    VITALS:  Blood pressure (!) 75/49, pulse 76, temperature 98.1 F (36.7 C), temperature source Oral, resp. rate (!) 24, height 5\' 2"  (1.575 m), weight 47.9 kg, SpO2 99 %. PHYSICAL EXAMINATION:  Physical Exam  GENERAL:  78 y.o.-year-old patient lying in the bed with no acute distress.  EYES: Pupils equal, round, reactive to light and accommodation. No scleral icterus. Extraocular muscles intact.  HEENT: Head atraumatic, normocephalic. Oropharynx and nasopharynx clear.  NECK:  Supple, no jugular venous distention. No thyroid enlargement, no tenderness.  LUNGS: Normal breath sounds bilaterally, no wheezing, rales,rhonchi or crepitation. No use of  accessory muscles of respiration.  CARDIOVASCULAR: S1, S2 normal. No murmurs, rubs, or gallops.  ABDOMEN: Soft, lower abdominal tenderness PD catheter in place, nondistended. Bowel sounds present. No rebound or guarding  EXTREMITIES: No pedal edema, cyanosis, or clubbing.  NEUROLOGIC: Cranial nerves II through XII are intact. Muscle strength 5/5 in all extremities. Sensation intact. Gait not checked.  PSYCHIATRIC: The patient is currently sleepy  sKIN: No obvious rash, lesion, or ulcer.   LABORATORY PANEL:  Female CBC Recent Labs  Lab 04/05/19 0625  WBC 15.3*  HGB 7.2*  HCT 22.3*  PLT 120*   ------------------------------------------------------------------------------------------------------------------ Chemistries  Recent Labs  Lab 03/30/19 1813  04/05/19 0625  NA 124*   < > 135  K 4.9   < > 3.9  CL 87*   < > 102  CO2 16*   < > 24  GLUCOSE 135*   < > 134*  BUN 117*   < > 31*  CREATININE 12.10*   < > 3.88*  CALCIUM 6.2*   < > 7.1*  MG  --    < > 2.1  AST 30  --   --   ALT 37  --   --   ALKPHOS 160*  --   --   BILITOT 0.8  --   --    < > = values in this interval not displayed.   RADIOLOGY:  No results found. ASSESSMENT AND PLAN:   Patient 78 year old white female who is presenting with abdominal pain and diarrhea  1.   Acute colitis involving the transverse colon and ascending colon Found on CT scan.  Patient presented with abdominal pain and diarrhea, C. difficile negative.  GI panel negative. Noticed improvement with IV antibiotics with Zosyn.  Abdominal pains and diarrhea improved.  However due to worsening thrombocytopenia Zosyn was discontinued previously and started on IV ciprofloxacin and Flagyl. Also noted incidental findings on CT scan including cirrhosis with sequelae of portal hypertension as well as complex bilateral renal masses not well visualized.  Follow-up with contrast-enhanced outpatient renal mass protocol recommended.  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  Follow-up with non-emergent outpatient ultrasound also recommended.  Prior diagnosis of lung mass. Patient was supposed to follow-up with pulmonologist as outpatient for further evaluation to include bronchoscopy with possible biopsy.  I talked with Dr. Lillard Anes during this admission who will make arrangements for outpatient follow-up post discharge from the hospital.  2.  End-stage renal disease  Patient missed peritoneal dialysis prior to admission. Patient had permacath replaced on zero 8/0 11/17/2018 and subsequently had hemodialysis same day.  Patient has been having intermittent hemodialysis as backup.  Scheduled for peritoneal dialysis today. Discussed with nephrologist and plan is for continuing peritoneal dialysis on discharge from the hospital  3.  Hyponatremia due to dehydration  Patient on gentle IV fluid hydration.  Sodium level of 135 this morning with hemodialysis Hypomagnesemia; replaced  4.  Hypotension continue midodrine. Increased rate of IV fluids this morning.  Monitor  5.  Diabetes type 2 we will place on sliding scale insulin currently not on any medications at home  6.  Depression anxiety continue Wellbutrin and trazodone  7.    Acute on chronic anemia.   Hemoglobin down to 6.7.  Transfused with 1 unit of packed red blood cells.  Hemoglobin noted to be 7.2 today.  Follow-up on CBC in a.m.  8.  Thrombocytopenia Platelet count down to 90.  Zosyn discontinued and platelet count improved to 120 today.  Follow-up CBC in a.m.  DVT prophylaxis; hold off on heparin due to anemia requiring packed red blood cell transfusion as well as thrombocytopenia. SCDs Physical therapy consult placed to evaluate and treat  All the records are reviewed and case discussed with Care Management/Social Worker. Management plans discussed with the patient, family and they are in agreement. I called and updated patient's daughter [name] on treatment plans recently.  All  questions were answered and she is in agreement with the plan of care.  CODE STATUS: DNR  TOTAL TIME TAKING CARE OF THIS PATIENT: 34 minutes.   More than 50% of the time was spent in counseling/coordination of care: YES  POSSIBLE D/C IN 2 DAYS, DEPENDING ON CLINICAL CONDITION.   Vianey Caniglia M.D on 04/05/2019 at 2:17 PM  Between 7am to 6pm - Pager - [phone]  After 6pm go to [email or web address] - Proofreader  [facility]  Office  [phone]  CC: Primary care physician; Caryl Bis, MD  Note: This dictation was prepared with Dragon dictation along with smaller phrase technology. Any transcriptional errors that result from this process are unintentional.

## 2019-04-05 NOTE — Progress Notes (Signed)
CCPD started.

## 2019-04-05 NOTE — Progress Notes (Signed)
PT Cancellation Note  Patient Details Name: [name] MRN: [id number] DOB: [birth year]   Cancelled Treatment:    Reason Eval/Treat Not Completed: Patient declined, no reason specified Order received and chart reviewed. PT attempted 1415 and pt refused PT session due to "feeling sick and fatigue" Nurse communicated and reports pt has been vomiting and nausea after lunch. PT will re-attempt at a later date/time when appropriate.    Sherrilyn Rist, SPT 04/05/2019, 2:16 PM

## 2019-04-05 NOTE — Care Management Important Message (Signed)
Important Message  Patient Details  Name: [name] MRN: [id number] Date of Birth: July 14, 1941   Medicare Important Message Given:  Yes     Dannette Barbara 04/05/2019, 1:55 PM

## 2019-04-06 ENCOUNTER — Telehealth: Payer: Self-pay | Admitting: Internal Medicine

## 2019-04-06 DIAGNOSIS — Z992 Dependence on renal dialysis: Secondary | ICD-10-CM | POA: Diagnosis not present

## 2019-04-06 DIAGNOSIS — N186 End stage renal disease: Secondary | ICD-10-CM | POA: Diagnosis not present

## 2019-04-06 LAB — CBC
HCT: 23.3 % — ABNORMAL LOW (ref 36.0–46.0)
Hemoglobin: 7.2 g/dL — ABNORMAL LOW (ref 12.0–15.0)
MCH: 28.6 pg (ref 26.0–34.0)
MCHC: 30.9 g/dL (ref 30.0–36.0)
MCV: 92.5 fL (ref 80.0–100.0)
Platelets: 158 10*3/uL (ref 150–400)
RBC: 2.52 MIL/uL — ABNORMAL LOW (ref 3.87–5.11)
RDW: 16.9 % — ABNORMAL HIGH (ref 11.5–15.5)
WBC: 12 10*3/uL — ABNORMAL HIGH (ref 4.0–10.5)
nRBC: 0.2 % (ref 0.0–0.2)

## 2019-04-06 LAB — BASIC METABOLIC PANEL
Anion gap: 8 (ref 5–15)
BUN: 39 mg/dL — ABNORMAL HIGH (ref 8–23)
CO2: 23 mmol/L (ref 22–32)
Calcium: 7.3 mg/dL — ABNORMAL LOW (ref 8.9–10.3)
Chloride: 104 mmol/L (ref 98–111)
Creatinine, Ser: 4.66 mg/dL — ABNORMAL HIGH (ref 0.44–1.00)
GFR calc Af Amer: 10 mL/min — ABNORMAL LOW (ref >60)
GFR calc non Af Amer: 8 mL/min — ABNORMAL LOW (ref >60)
Glucose, Bld: 125 mg/dL — ABNORMAL HIGH (ref 70–99)
Potassium: 3.6 mmol/L (ref 3.5–5.1)
Sodium: 135 mmol/L (ref 135–145)

## 2019-04-06 LAB — MAGNESIUM: Magnesium: 1.9 mg/dL (ref 1.7–2.4)

## 2019-04-06 LAB — PHOSPHORUS: Phosphorus: 2.9 mg/dL (ref 2.5–4.6)

## 2019-04-06 MED ORDER — SODIUM CHLORIDE 0.9 % IV BOLUS
250.0000 mL | Freq: Once | INTRAVENOUS | Status: AC
  Administered 2019-04-06: 250 mL via INTRAVENOUS

## 2019-04-06 MED ORDER — AMOXICILLIN-POT CLAVULANATE 875-125 MG PO TABS: 1.0000 | ORAL_TABLET | Freq: Two times a day (BID) | ORAL | 0 refills | Status: AC

## 2019-04-06 MED ORDER — PRO-STAT SUGAR FREE PO LIQD: 30.0000 mL | Freq: Two times a day (BID) | ORAL | 0 refills | Status: AC

## 2019-04-06 NOTE — Telephone Encounter (Signed)
04/06/19  9am: Text from dtr [name], with request to call [facility] [phone] Ext [phone]) regarding Medicaid application.   [name] was hoping that I could provide the clinical information necessary that could possibly expedite patient's application. [name] and I thought that the application could be given a higher priority if it were shown that patient was very ill. Unfortunately this is not the case.  Because patient is over income, Jacqulyn Cane needs to have the current provider bills to further consider the application. The release of medical informations forms have been sent to those providers, and Marylyn is awaiting those responses.  I updated patient's daughter.  Violeta Gelinas NP-C [phone]

## 2019-04-06 NOTE — Progress Notes (Signed)
CCPD terminated. Aseptic technique maintained. Output 4878 mL.

## 2019-04-06 NOTE — Progress Notes (Signed)
Dr. Marcille Blanco notified of hypotension, which is chronic, acknowledged; no new orders. FOB elevated; patient  asymptomatic, denies discomfort or distress at this time. Barbaraann Faster, RN 5:42 AM; 04/06/2019

## 2019-04-06 NOTE — Discharge Summary (Signed)
[facility] [place] NAME: [name]    MR#:  [id number]  DATE OF BIRTH:  20-Jun-1941  DATE OF ADMISSION:  03/30/2019   ADMITTING PHYSICIAN: Dustin Flock, MD  DATE OF DISCHARGE: 04/06/2019  PRIMARY CARE PHYSICIAN: Caryl Bis, MD   ADMISSION DIAGNOSIS:  Generalized abdominal pain [R10.84] Acute renal failure superimposed on chronic kidney disease, unspecified CKD stage, unspecified acute renal failure type ([place]) [N17.9, N18.9] DISCHARGE DIAGNOSIS:  Active Problems:   Abdominal pain  SECONDARY DIAGNOSIS:   Past Medical History:  Diagnosis Date  . 10 year risk of MI or stroke 7.5% or greater   . Anemia   . Ascites   . Bursitis   . CKD (chronic kidney disease)   . Diabetes mellitus ([place])   . Esophageal varices ([place])   . ESRD (end stage renal disease) ([place]) 12/2018   to start dialysis  . FHx: migraine headaches   . Gastritis and duodenitis   . GERD (gastroesophageal reflux disease)   . Hiatal hernia   . Hypercholesterolemia   . Hypertension   . Kidney stone   . Liver cirrhosis ([place])   . Melena   . Portal hypertension ([place])   . Renal cell carcinoma ([place])   . Renal lithiasis   . Takotsubo cardiomyopathy 06/2013   HOSPITAL COURSE:  Chief complaint; abdominal pains   History of presenting complaint; [name]  is a 78 y.o. female with a known history of end-stage renal disease currently on peritoneal dialysis, liver cirrhosis, diabetes type 2, GERD, hypertension, hyperlipidemia who is presenting with abdominal pain and diarrhea.  CT scan of abdomen and pelvis was ordered and patient admitted to medical service.  Hospital course; 1.  Acute colitis involving the transverse colon and ascending colon Found on CT scan.  Patient presented with abdominal pain and diarrhea, C. difficile negative.  GI panel negative.  Patient was treated with IV Zosyn with clinical improvement.  Zosyn had to be discontinued  previously due to worsening thrombocytopenia and patient started on levofloxacin and Flagyl.  Abdominal pain significantly improved.  Diarrhea resolved.  Patient adequately hydrated with IV fluids.  Wishes to be discharged home today.  Patient being discharged on p.o. Augmentin to complete a total of at least 7 days of treatment duration with antibiotics.    Also noted incidental findings on CT scan including cirrhosis with sequelae of portal hypertension as well as complex bilateral renal masses not well visualized.  Follow-up with contrast-enhanced outpatient renal mass protocol recommended.  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  Follow-up with non-emergent outpatient ultrasound also recommended.  To follow-up with primary care physician for further evaluation of these findings.  I updated daughter as well on the findings.  Prior diagnosis of lung mass. Patient was supposed to follow-up with pulmonologist as outpatient for further evaluation to include bronchoscopy with possible biopsy.  I talked with Dr. Lillard Anes during this admission who will make arrangements for outpatient follow-up post discharge from the hospital.  2.End-stage renal disease  Patient missed peritoneal dialysis prior to admission. Patient had permacath replaced on 04/02/2019. Patient was having intermittent hemodialysis as backup during this admission.  Now back on peritoneal dialysis.  Plan is to continue with peritoneal dialysis as outpatient on discharge.  Patient cleared for discharge by nephrology ; Dr. Candiss Norse today.  3.Hyponatremia due to dehydration  Patient on gentle IV fluid hydration.  Sodium level of 135 this morning with hemodialysis Hypomagnesemia; replaced  4. Chronichypotension continue midodrine. Patient blood pressure runs low chronically.  Already on midodrine.  Continue the same.  Was adequately hydrated with fluids during this admission.  Discussed with nephrologist this morning who confirmed the  same and okayed plans for discharge.  Patient remains asymptomatic.  5.Diabetes type 2  Blood sugars controlled.  Outpatient monitoring by primary care physician   6.Depression anxiety continue Wellbutrin and trazodone  7.  Acute on chronic anemia.   Hemoglobin down to 6.7.  Transfused with 1 unit of packed red blood cells.  Hemoglobin remained stable at 7.2   8.  Thrombocytopenia Platelet count down to 90.  Zosyn discontinued and platelet count improved to 158   9.  Debility due to multiple medical problems. Daughter confirmed patient has home health services already at home.  To resume home health services on discharge.  Updated daughter on treatment and discharge plans this morning and she is in agreement with the plan of care as outlined.  DISCHARGE CONDITIONS:  Stable CONSULTS OBTAINED:   DRUG ALLERGIES:   Allergies  Allergen Reactions  . Codeine Other (See Comments)    Reaction unknown    DISCHARGE MEDICATIONS:   Allergies as of 04/06/2019      Reactions   Codeine Other (See Comments)   Reaction unknown       Medication List    STOP taking these medications   doxycycline 100 MG tablet Commonly known as: VIBRA-TABS   torsemide 20 MG tablet Commonly known as: DEMADEX     TAKE these medications   acetaminophen 500 MG tablet Commonly known as: TYLENOL Take 500 mg by mouth every 6 (six) hours as needed for mild pain or fever.   amoxicillin-clavulanate 875-125 MG tablet Commonly known as: Augmentin Take 1 tablet by mouth 2 (two) times daily for 3 days.   buPROPion 100 MG 12 hr tablet Commonly known as: WELLBUTRIN SR Take 100 mg by mouth daily.   cholecalciferol 1000 units tablet Commonly known as: VITAMIN D Take 2,000 Units by mouth daily.   feeding supplement (PRO-STAT SUGAR FREE 64) Liqd Take 30 mLs by mouth 2 (two) times daily.   FLUoxetine 40 MG capsule Commonly known as: PROZAC Take 40 mg by mouth daily.   lactulose 10 GM/15ML solution  Commonly known as: CHRONULAC Take 10 g by mouth 2 (two) times daily.   midodrine 10 MG tablet Commonly known as: PROAMATINE Take 10 mg by mouth 3 (three) times daily after meals.   Nephro Vitamins 0.8 MG Tabs Take 1 tablet by mouth daily.   ondansetron 4 MG tablet Commonly known as: ZOFRAN Take 4 mg by mouth every 6 (six) hours as needed.   pantoprazole 40 MG tablet Commonly known as: PROTONIX Take 40 mg by mouth 2 (two) times daily.   PreserVision AREDS Tabs Take 1 tablet by mouth daily.   traZODone 50 MG tablet Commonly known as: DESYREL Take 100 mg by mouth at bedtime.   vitamin B-12 500 MCG tablet Commonly known as: CYANOCOBALAMIN Take 500 mcg by mouth daily.        DISCHARGE INSTRUCTIONS:   DIET:  Diabetic diet DISCHARGE CONDITION:  Stable ACTIVITY:  Activity as tolerated OXYGEN:  Home Oxygen: No.  Oxygen Delivery: room air DISCHARGE LOCATION:  home   If you experience worsening of your admission symptoms, develop shortness of breath, life threatening emergency, suicidal or homicidal thoughts you must seek medical attention immediately by calling 911 or calling your MD immediately  if symptoms less severe.  You Must read complete instructions/literature along with all the possible adverse reactions/side effects for all the Medicines you take and that have been prescribed to you. Take any new Medicines after you have completely understood and accpet all the possible adverse reactions/side effects.   Please note  You were cared for by a hospitalist during your hospital stay. If you have any questions about your discharge medications or the care you received while you were in the hospital after you are discharged, you can call the unit and asked to speak with the hospitalist on call if the hospitalist that took care of you is not available. Once you are discharged, your primary care physician will handle any further medical issues. Please note that NO REFILLS  for any discharge medications will be authorized once you are discharged, as it is imperative that you return to your primary care physician (or establish a relationship with a primary care physician if you do not have one) for your aftercare needs so that they can reassess your need for medications and monitor your lab values.    On the day of Discharge:  VITAL SIGNS:  Blood pressure (!) 88/48, pulse 81, temperature 97.6 F (36.4 C), temperature source Oral, resp. rate 17, height 5\' 2"  (1.575 m), weight 47.9 kg, SpO2 98 %. PHYSICAL EXAMINATION:  GENERAL:  78 y.o.-year-old patient lying in the bed with no acute distress.  EYES: Pupils equal, round, reactive to light and accommodation. No scleral icterus. Extraocular muscles intact.  HEENT: Head atraumatic, normocephalic. Oropharynx and nasopharynx clear.  NECK:  Supple, no jugular venous distention. No thyroid enlargement, no tenderness.  LUNGS: Normal breath sounds bilaterally, no wheezing, rales,rhonchi or crepitation. No use of accessory muscles of respiration.  CARDIOVASCULAR: S1, S2 normal. No murmurs, rubs, or gallops.  ABDOMEN: Soft,lower abdominal tenderness PD catheter in place, nondistended. Bowel sounds present. No rebound or guarding  NEUROLOGIC: Cranial nerves II through XII are intact. Muscle strength 5/5 in all extremities. Sensation intact. Gait not checked.  PSYCHIATRIC: The patient is alert and oriented x 3.  SKIN: No obvious rash, lesion, or ulcer.  DATA REVIEW:   CBC Recent Labs  Lab 04/06/19 0508  WBC 12.0*  HGB 7.2*  HCT 23.3*  PLT 158    Chemistries  Recent Labs  Lab 03/30/19 1813  04/06/19 0508  NA 124*   < > 135  K 4.9   < > 3.6  CL 87*   < > 104  CO2 16*   < > 23  GLUCOSE 135*   < > 125*  BUN 117*   < > 39*  CREATININE 12.10*   < > 4.66*  CALCIUM 6.2*   < > 7.3*  MG  --    < > 1.9  AST 30  --   --   ALT 37  --   --   ALKPHOS 160*  --   --   BILITOT 0.8  --   --    < > = values in this  interval not displayed.     Microbiology Results  Results for orders placed or performed during the hospital encounter of 03/30/19  Blood Culture (routine x 2)     Status: None   Collection Time: 03/30/19  7:41 PM   Specimen: BLOOD  Result Value Ref Range Status   Specimen Description BLOOD LEFT ANTECUBITAL  Final   Special Requests   Final    BOTTLES DRAWN AEROBIC AND ANAEROBIC Blood Culture adequate volume  Culture   Final    NO GROWTH 5 DAYS Performed at [facility], [address] [place], [place] [address]    Report Status 04/04/2019 FINAL  Final  Blood Culture (routine x 2)     Status: None   Collection Time: 03/30/19  7:42 PM   Specimen: BLOOD  Result Value Ref Range Status   Specimen Description BLOOD RIGHT ANTECUBITAL  Final   Special Requests   Final    BOTTLES DRAWN AEROBIC AND ANAEROBIC Blood Culture results may not be optimal due to an excessive volume of blood received in culture bottles   Culture   Final    NO GROWTH 5 DAYS Performed at [facility], [address] [place], [place] [address]    Report Status 04/04/2019 FINAL  Final  SARS Coronavirus 2 [facility] order, Performed in [place] hospital lab)     Status: None   Collection Time: 03/30/19  7:42 PM  Result Value Ref Range Status   SARS Coronavirus 2 NEGATIVE NEGATIVE Final    Comment: (NOTE) If result is NEGATIVE SARS-CoV-2 target nucleic acids are NOT DETECTED. The SARS-CoV-2 RNA is generally detectable in upper and lower  respiratory specimens during the acute phase of infection. The lowest  concentration of SARS-CoV-2 viral copies this assay can detect is 250  copies / mL. A negative result does not preclude SARS-CoV-2 infection  and should not be used as the sole basis for treatment or other  patient management decisions.  A negative result may occur with  improper specimen collection / handling, submission of specimen other  than nasopharyngeal swab, presence of viral  mutation(s) within the  areas targeted by this assay, and inadequate number of viral copies  (<250 copies / mL). A negative result must be combined with clinical  observations, patient history, and epidemiological information. If result is POSITIVE SARS-CoV-2 target nucleic acids are DETECTED. The SARS-CoV-2 RNA is generally detectable in upper and lower  respiratory specimens dur ing the acute phase of infection.  Positive  results are indicative of active infection with SARS-CoV-2.  Clinical  correlation with patient history and other diagnostic information is  necessary to determine patient infection status.  Positive results do  not rule out bacterial infection or co-infection with other viruses. If result is PRESUMPTIVE POSTIVE SARS-CoV-2 nucleic acids MAY BE PRESENT.   A presumptive positive result was obtained on the submitted specimen  and confirmed on repeat testing.  While 2019 novel coronavirus  (SARS-CoV-2) nucleic acids may be present in the submitted sample  additional confirmatory testing may be necessary for epidemiological  and / or clinical management purposes  to differentiate between  SARS-CoV-2 and other Sarbecovirus currently known to infect humans.  If clinically indicated additional testing with an alternate test  methodology [phone]) is advised. The SARS-CoV-2 RNA is generally  detectable in upper and lower respiratory sp ecimens during the acute  phase of infection. The expected result is Negative. Fact Sheet for Patients:  StrictlyIdeas.no Fact Sheet for Healthcare Providers: BankingDealers.co.za This test is not yet approved or cleared by the [place] FDA and has been authorized for detection and/or diagnosis of SARS-CoV-2 by FDA under an Emergency Use Authorization (EUA).  This EUA will remain in effect (meaning this test can be used) for the duration of the COVID-19 declaration under Section 564(b)(1)  of the Act, 21 U.S.C. section 360bbb-3(b)(1), unless the authorization is terminated or revoked sooner. Performed at [facility], [address] [place],  [place] [address]   Gastrointestinal Panel by PCR , Stool     Status: None   Collection Time: 03/30/19 11:36 PM   Specimen: Stool  Result Value Ref Range Status   Campylobacter species NOT DETECTED NOT DETECTED Final   Plesimonas shigelloides NOT DETECTED NOT DETECTED Final   Salmonella species NOT DETECTED NOT DETECTED Final   Yersinia enterocolitica NOT DETECTED NOT DETECTED Final   Vibrio species NOT DETECTED NOT DETECTED Final   Vibrio cholerae NOT DETECTED NOT DETECTED Final   Enteroaggregative E coli (EAEC) NOT DETECTED NOT DETECTED Final   Enteropathogenic E coli (EPEC) NOT DETECTED NOT DETECTED Final   Enterotoxigenic E coli (ETEC) NOT DETECTED NOT DETECTED Final   Shiga like toxin producing E coli (STEC) NOT DETECTED NOT DETECTED Final   Shigella/Enteroinvasive E coli (EIEC) NOT DETECTED NOT DETECTED Final   Cryptosporidium NOT DETECTED NOT DETECTED Final   Cyclospora cayetanensis NOT DETECTED NOT DETECTED Final   Entamoeba histolytica NOT DETECTED NOT DETECTED Final   Giardia lamblia NOT DETECTED NOT DETECTED Final   Adenovirus F40/41 NOT DETECTED NOT DETECTED Final   Astrovirus NOT DETECTED NOT DETECTED Final   Norovirus GI/GII NOT DETECTED NOT DETECTED Final   Rotavirus A NOT DETECTED NOT DETECTED Final   Sapovirus (I, II, IV, and V) NOT DETECTED NOT DETECTED Final    Comment: Performed at [facility], [address], [place], [place] [address]  Body fluid culture     Status: None   Collection Time: 03/31/19  1:11 PM   Specimen: Ascitic; Body Fluid  Result Value Ref Range Status   Specimen Description   Final    ASCITIC Performed at [facility], [address] [place], [place] [address]    Special Requests   Final    Immunocompromised Performed at [facility], [place], [place] [address]    Gram Stain   Final    MODERATE WBC PRESENT,BOTH PMN AND MONONUCLEAR NO ORGANISMS SEEN    Culture   Final    NO GROWTH 3 DAYS Performed at [facility], [place] [address] [place], [place] [address]    Report Status 04/04/2019 FINAL  Final    RADIOLOGY:  Dg Abd 1 View  Result Date: 04/05/2019 CLINICAL DATA:  Abdominal distention EXAM: ABDOMEN - 1 VIEW COMPARISON:  March 30, 2019 FINDINGS: Peritoneal dialysis catheter in the right abdomen. Prior cholecystectomy. There is a non obstructive bowel gas pattern. No supine evidence of free air. No organomegaly or suspicious calcification. No acute bony abnormality. IMPRESSION: No acute findings. Electronically Signed   By: Rolm Baptise M.D.   On: 04/05/2019 19:07     Management plans discussed with the patient, family and they are in agreement.  CODE STATUS: DNR   TOTAL TIME TAKING CARE OF THIS PATIENT: 38 minutes.    Britania Shreeve M.D on 04/06/2019 at 10:30 AM  Between 7am to 6pm - Pager - [phone]  After 6pm go to [email or web address] - Proofreader  [facility]  Office  [phone]  CC: Primary care physician; Caryl Bis, MD   Note: This dictation was prepared with Dragon dictation along with smaller phrase technology. Any transcriptional errors that result from this process are unintentional.

## 2019-04-06 NOTE — Progress Notes (Signed)
[name] to be D/C'd home per MD order.  Discussed prescriptions and follow up appointments with the patient. Prescriptions given to patient, medication list explained in detail. Pt verbalized understanding.  Allergies as of 04/06/2019      Reactions   Codeine Other (See Comments)   Reaction unknown       Medication List    STOP taking these medications   doxycycline 100 MG tablet Commonly known as: VIBRA-TABS   torsemide 20 MG tablet Commonly known as: DEMADEX     TAKE these medications   acetaminophen 500 MG tablet Commonly known as: TYLENOL Take 500 mg by mouth every 6 (six) hours as needed for mild pain or fever.   amoxicillin-clavulanate 875-125 MG tablet Commonly known as: Augmentin Take 1 tablet by mouth 2 (two) times daily for 3 days.   buPROPion 100 MG 12 hr tablet Commonly known as: WELLBUTRIN SR Take 100 mg by mouth daily.   cholecalciferol 1000 units tablet Commonly known as: VITAMIN D Take 2,000 Units by mouth daily.   feeding supplement (PRO-STAT SUGAR FREE 64) Liqd Take 30 mLs by mouth 2 (two) times daily.   FLUoxetine 40 MG capsule Commonly known as: PROZAC Take 40 mg by mouth daily.   lactulose 10 GM/15ML solution Commonly known as: CHRONULAC Take 10 g by mouth 2 (two) times daily.   midodrine 10 MG tablet Commonly known as: PROAMATINE Take 10 mg by mouth 3 (three) times daily after meals.   Nephro Vitamins 0.8 MG Tabs Take 1 tablet by mouth daily.   ondansetron 4 MG tablet Commonly known as: ZOFRAN Take 4 mg by mouth every 6 (six) hours as needed.   pantoprazole 40 MG tablet Commonly known as: PROTONIX Take 40 mg by mouth 2 (two) times daily.   PreserVision AREDS Tabs Take 1 tablet by mouth daily.   traZODone 50 MG tablet Commonly known as: DESYREL Take 100 mg by mouth at bedtime.   vitamin B-12 500 MCG tablet Commonly known as: CYANOCOBALAMIN Take 500 mcg by mouth daily.       Vitals:   04/06/19 0535 04/06/19 0828   BP: (!) 76/38 (!) 88/48  Pulse:  81  Resp:    Temp:    SpO2:      Skin clean, dry and intact without evidence of skin break down, no evidence of skin tears noted. IV catheter discontinued intact. Site without signs and symptoms of complications. Dressing and pressure applied. Pt denies pain at this time. No complaints noted.  An After Visit Summary was printed and given to the patient. Patient escorted via [place], and D/C home via private auto.  Chuck Hint RN [facility] [employer]

## 2019-04-06 NOTE — TOC Transition Note (Signed)
Transition of Care [facility]) - CM/SW Discharge Note   Patient Details  Name: [name] MRN: [id number] Date of Birth: 10-16-1940  [facility] CM/SW Contact:  Candie Chroman, LCSW Phone Number: 04/06/2019, 11:41 AM   Clinical Narrative: Patient has orders to discharge home today. CSW asked MD to add OT to the home health order, per [facility]'s request. No further concerns. CSW signing off.    Final next level of care: Home w Home Health Services Barriers to Discharge: Barriers Resolved   Patient Goals and CMS Choice     Choice offered to / list presented to : NA  Discharge Placement                    Patient and family notified of of transfer: 04/06/19  Discharge Plan and Services     Post Acute Care Choice: Home Health                    HH Arranged: RN, PT, OT, Nurse's Aide [facility] Agency: [place] ([place]) Date [facility] Agency Contacted: 04/06/19   Representative spoke with at [place]: Alex ([place]) Interventions     Readmission Risk Interventions Readmission Risk Prevention Plan 04/04/2019 03/18/2019  Transportation Screening Complete Complete  Medication Review [employer]) Complete -  [place] or Home Care Consult Complete -  SW Recovery Care/Counseling Consult Complete -  Palliative Care Screening Not Applicable -  [place] Not Applicable -  Some recent data might be hidden

## 2019-04-06 NOTE — Progress Notes (Signed)
[facility]  ROUNDING NOTE   Subjective:   BP remains low but she feels well. Tolerated PD. Feels abdomen is softer Appetite is good No leg edema    Objective:  Vital signs in last 24 hours:  Temp:  [97.6 F (36.4 C)-98.3 F (36.8 C)] 97.6 F (36.4 C) (08/07 0517) Pulse Rate:  [70-81] 81 (08/07 0828) Resp:  [17-24] 17 (08/07 0517) BP: (74-99)/(38-56) 88/48 (08/07 0828) SpO2:  [98 %-100 %] 98 % (08/07 0517)  Weight change:  Filed Weights   04/02/19 1310 04/04/19 0900 04/04/19 0906  Weight: 40.3 kg 47.9 kg 47.9 kg    Intake/Output: I/O last 3 completed shifts: In: 3880.3 [P.O.:720; I.V.:2390.1; IV Piggyback:770.2] Out: -    Intake/Output this shift:  Total I/O In: 120 [P.O.:120] Out: -   Physical Exam: General: NAD, laying in bed  Head: Moist oral mucosal membranes  Eyes: Anicteric,    Neck: Supple, trachea midline  Lungs:  Clear to auscultation  Heart: Regular rate and rhythm  Abdomen:  Soft, nontender, +ascites  Extremities: no peripheral edema.  Neurologic: Nonfocal, moving all four extremities  Skin: No lesions  Access:  PD catheter, RIJ permcath    Basic Metabolic Panel: Recent Labs  Lab 04/02/19 0451 04/03/19 0430 04/04/19 0155 04/05/19 0625 04/06/19 0508  NA 124* 134* 132* 135 135  K 3.3* 3.3* 3.5 3.9 3.6  CL 89* 102 102 102 104  CO2 16* 23 19* 24 23  GLUCOSE 154* 126* 157* 134* 125*  BUN 106* 45* 46* 31* 39*  CREATININE 10.61* 5.54* 6.09* 3.88* 4.66*  CALCIUM 6.0* 6.1* 6.1* 7.1* 7.3*  MG 1.5* 1.5* 1.5* 2.1 1.9  PHOS 10.4* 4.9* 4.3 2.9 2.9    Liver Function Tests: Recent Labs  Lab 03/30/19 1221 03/30/19 1813  AST 30 30  ALT 37 37  ALKPHOS 137* 160*  BILITOT 0.8 0.8  PROT 5.0* 5.1*  ALBUMIN 2.3* 2.5*   Recent Labs  Lab 03/30/19 1813  LIPASE 97*   No results for input(s): AMMONIA in the last 168 hours.  CBC: Recent Labs  Lab 03/30/19 1221  04/02/19 0451 04/03/19 0430 04/04/19 0155 04/05/19 0625  04/06/19 0508  WBC 15.9*   < > 13.2* 10.1 13.6* 15.3* 12.0*  NEUTROABS 11.9*  --   --   --   --   --   --   HGB 7.5*   < > 8.2* 7.3* 7.2* 7.2* 7.2*  HCT 22.7*   < > 23.8* 21.7* 21.9* 22.3* 23.3*  MCV 86.0   < > 83.5 86.1 87.3 89.2 92.5  PLT 193   < > 109* 90* 108* 120* 158   < > = values in this interval not displayed.    Cardiac Enzymes: No results for input(s): CKTOTAL, CKMB, CKMBINDEX, TROPONINI in the last 168 hours.  BNP: Invalid input(s): POCBNP  CBG: Recent Labs  Lab 04/02/19 1317 04/02/19 1426 04/04/19 0739 04/05/19 2239  GLUCAP 107* 122* 150* 191*    Microbiology: Results for orders placed or performed during the hospital encounter of 03/30/19  Blood Culture (routine x 2)     Status: None   Collection Time: 03/30/19  7:41 PM   Specimen: BLOOD  Result Value Ref Range Status   Specimen Description BLOOD LEFT ANTECUBITAL  Final   Special Requests   Final    BOTTLES DRAWN AEROBIC AND ANAEROBIC Blood Culture adequate volume   Culture   Final    NO GROWTH 5 DAYS Performed at [facility]  Lab, [place], [place] [address]    Report Status 04/04/2019 FINAL  Final  Blood Culture (routine x 2)     Status: None   Collection Time: 03/30/19  7:42 PM   Specimen: BLOOD  Result Value Ref Range Status   Specimen Description BLOOD RIGHT ANTECUBITAL  Final   Special Requests   Final    BOTTLES DRAWN AEROBIC AND ANAEROBIC Blood Culture results may not be optimal due to an excessive volume of blood received in culture bottles   Culture   Final    NO GROWTH 5 DAYS Performed at [facility], [address], [place], [place] [address]    Report Status 04/04/2019 FINAL  Final  SARS Coronavirus 2 [facility] order, Performed in [place] hospital lab)     Status: None   Collection Time: 03/30/19  7:42 PM  Result Value Ref Range Status   SARS Coronavirus 2 NEGATIVE NEGATIVE Final    Comment: (NOTE) If result is NEGATIVE SARS-CoV-2 target nucleic  acids are NOT DETECTED. The SARS-CoV-2 RNA is generally detectable in upper and lower  respiratory specimens during the acute phase of infection. The lowest  concentration of SARS-CoV-2 viral copies this assay can detect is 250  copies / mL. A negative result does not preclude SARS-CoV-2 infection  and should not be used as the sole basis for treatment or other  patient management decisions.  A negative result may occur with  improper specimen collection / handling, submission of specimen other  than nasopharyngeal swab, presence of viral mutation(s) within the  areas targeted by this assay, and inadequate number of viral copies  (<250 copies / mL). A negative result must be combined with clinical  observations, patient history, and epidemiological information. If result is POSITIVE SARS-CoV-2 target nucleic acids are DETECTED. The SARS-CoV-2 RNA is generally detectable in upper and lower  respiratory specimens dur ing the acute phase of infection.  Positive  results are indicative of active infection with SARS-CoV-2.  Clinical  correlation with patient history and other diagnostic information is  necessary to determine patient infection status.  Positive results do  not rule out bacterial infection or co-infection with other viruses. If result is PRESUMPTIVE POSTIVE SARS-CoV-2 nucleic acids MAY BE PRESENT.   A presumptive positive result was obtained on the submitted specimen  and confirmed on repeat testing.  While 2019 novel coronavirus  (SARS-CoV-2) nucleic acids may be present in the submitted sample  additional confirmatory testing may be necessary for epidemiological  and / or clinical management purposes  to differentiate between  SARS-CoV-2 and other Sarbecovirus currently known to infect humans.  If clinically indicated additional testing with an alternate test  methodology [phone]) is advised. The SARS-CoV-2 RNA is generally  detectable in upper and lower respiratory  sp ecimens during the acute  phase of infection. The expected result is Negative. Fact Sheet for Patients:  StrictlyIdeas.no Fact Sheet for Healthcare Providers: BankingDealers.co.za This test is not yet approved or cleared by the [place] FDA and has been authorized for detection and/or diagnosis of SARS-CoV-2 by FDA under an Emergency Use Authorization (EUA).  This EUA will remain in effect (meaning this test can be used) for the duration of the COVID-19 declaration under Section 564(b)(1) of the Act, 21 U.S.C. section 360bbb-3(b)(1), unless the authorization is terminated or revoked sooner. Performed at [facility], [address] [place], [place] [address]   Gastrointestinal Panel by PCR , Stool     Status:  None   Collection Time: 03/30/19 11:36 PM   Specimen: Stool  Result Value Ref Range Status   Campylobacter species NOT DETECTED NOT DETECTED Final   Plesimonas shigelloides NOT DETECTED NOT DETECTED Final   Salmonella species NOT DETECTED NOT DETECTED Final   Yersinia enterocolitica NOT DETECTED NOT DETECTED Final   Vibrio species NOT DETECTED NOT DETECTED Final   Vibrio cholerae NOT DETECTED NOT DETECTED Final   Enteroaggregative E coli (EAEC) NOT DETECTED NOT DETECTED Final   Enteropathogenic E coli (EPEC) NOT DETECTED NOT DETECTED Final   Enterotoxigenic E coli (ETEC) NOT DETECTED NOT DETECTED Final   Shiga like toxin producing E coli (STEC) NOT DETECTED NOT DETECTED Final   Shigella/Enteroinvasive E coli (EIEC) NOT DETECTED NOT DETECTED Final   Cryptosporidium NOT DETECTED NOT DETECTED Final   Cyclospora cayetanensis NOT DETECTED NOT DETECTED Final   Entamoeba histolytica NOT DETECTED NOT DETECTED Final   Giardia lamblia NOT DETECTED NOT DETECTED Final   Adenovirus F40/41 NOT DETECTED NOT DETECTED Final   Astrovirus NOT DETECTED NOT DETECTED Final   Norovirus GI/GII NOT DETECTED NOT DETECTED Final    Rotavirus A NOT DETECTED NOT DETECTED Final   Sapovirus (I, II, IV, and V) NOT DETECTED NOT DETECTED Final    Comment: Performed at [facility], [address], [place], [place] [address]  Body fluid culture     Status: None   Collection Time: 03/31/19  1:11 PM   Specimen: Ascitic; Body Fluid  Result Value Ref Range Status   Specimen Description   Final    ASCITIC Performed at [facility], [address] [place], [place] [address]    Special Requests   Final    Immunocompromised Performed at [facility], [place], [place] [address]    Gram Stain   Final    MODERATE WBC PRESENT,BOTH PMN AND MONONUCLEAR NO ORGANISMS SEEN    Culture   Final    NO GROWTH 3 DAYS Performed at [facility], [place] [address] [place],  [address]    Report Status 04/04/2019 FINAL  Final    Coagulation Studies: No results for input(s): LABPROT, INR in the last 72 hours.  Urinalysis: No results for input(s): COLORURINE, LABSPEC, PHURINE, GLUCOSEU, HGBUR, BILIRUBINUR, KETONESUR, PROTEINUR, UROBILINOGEN, NITRITE, LEUKOCYTESUR in the last 72 hours.  Invalid input(s): APPERANCEUR    Imaging: Dg Abd 1 View  Result Date: 04/05/2019 CLINICAL DATA:  Abdominal distention EXAM: ABDOMEN - 1 VIEW COMPARISON:  March 30, 2019 FINDINGS: Peritoneal dialysis catheter in the right abdomen. Prior cholecystectomy. There is a non obstructive bowel gas pattern. No supine evidence of free air. No organomegaly or suspicious calcification. No acute bony abnormality. IMPRESSION: No acute findings. Electronically Signed   By: Rolm Baptise M.D.   On: 04/05/2019 19:07     Medications:   . sodium chloride Stopped (04/03/19 1402)  . 0.9 % NaCl with KCl 20 mEq / L Stopped (04/06/19 0323)  . ciprofloxacin Stopped (04/05/19 2332)  . dialysis solution 1.5% low-MG/low-CA    . metronidazole 100 mL/hr at 04/06/19 0400  . sodium chloride     . buPROPion  100 mg Oral Daily   . calcium-vitamin D  1 tablet Oral TID  . Chlorhexidine Gluconate Cloth  6 each Topical Q0600  . cholecalciferol  2,000 Units Oral Daily  . epoetin (EPOGEN/PROCRIT) injection  20,000 Units Subcutaneous Weekly  . feeding supplement (PRO-STAT SUGAR FREE 64)  30 mL Oral BID  .  FLUoxetine  40 mg Oral Daily  . gentamicin cream  1 application Topical Daily  . metoprolol tartrate  12.5 mg Oral BID  . midodrine  10 mg Oral TID PC  . multivitamin  1 tablet Oral QHS  . multivitamin-lutein  1 capsule Oral Daily  . nystatin  5 mL Mouth/Throat QID  . pantoprazole  40 mg Oral BID  . sodium chloride flush  3 mL Intravenous Q12H  . traZODone  100 mg Oral QHS  . vitamin B-12  500 mcg Oral Daily   sodium chloride, acetaminophen **OR** acetaminophen, HYDROmorphone (DILAUDID) injection, loperamide, ondansetron (ZOFRAN) IV, sodium chloride flush  Assessment/ Plan:  [name] is a 78 y.o. 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   [facility] Peritoneal dialysis CCPD 1.5 Liters 4 exchanges.   1. End-stage renal disease with uremia 2. Hyponatremia 3. Metabolic acidosis 4. Anemia of chronic kidney disease status post 1 unit PRBC on 8/1 5. Secondary Hyperparathyroidism with Hypocalcemia and hyperphosphatemia. Currently on calcium acetate 6. Hypotension: on midodrine 10mg  tid 7. Hypokalemia   Plan Continue Peritoneal dialysis with chylous ascites. No signs or symptoms of peritonitis. Required back up hemodialysis intermittently Overall feels Ok with good appetite CCPD 6 cycles with 1.5 L  EPO administered SQ on the medical floor Expected discharge today. Continue outpatient PD    LOS: 7 Stanislawa Gaffin 8/7/202011:34 AM

## 2019-04-07 DIAGNOSIS — Z992 Dependence on renal dialysis: Secondary | ICD-10-CM | POA: Diagnosis not present

## 2019-04-07 DIAGNOSIS — K7581 Nonalcoholic steatohepatitis (NASH): Secondary | ICD-10-CM | POA: Diagnosis not present

## 2019-04-07 DIAGNOSIS — I5030 Unspecified diastolic (congestive) heart failure: Secondary | ICD-10-CM | POA: Diagnosis not present

## 2019-04-07 DIAGNOSIS — R188 Other ascites: Secondary | ICD-10-CM | POA: Diagnosis not present

## 2019-04-07 DIAGNOSIS — N186 End stage renal disease: Secondary | ICD-10-CM | POA: Diagnosis not present

## 2019-04-07 DIAGNOSIS — K922 Gastrointestinal hemorrhage, unspecified: Secondary | ICD-10-CM | POA: Diagnosis not present

## 2019-04-07 DIAGNOSIS — T82534D Leakage of infusion catheter, subsequent encounter: Secondary | ICD-10-CM | POA: Diagnosis not present

## 2019-04-07 DIAGNOSIS — K7460 Unspecified cirrhosis of liver: Secondary | ICD-10-CM | POA: Diagnosis not present

## 2019-04-07 DIAGNOSIS — I8501 Esophageal varices with bleeding: Secondary | ICD-10-CM | POA: Diagnosis not present

## 2019-04-07 DIAGNOSIS — I132 Hypertensive heart and chronic kidney disease with heart failure and with stage 5 chronic kidney disease, or end stage renal disease: Secondary | ICD-10-CM | POA: Diagnosis not present

## 2019-04-07 DIAGNOSIS — K766 Portal hypertension: Secondary | ICD-10-CM | POA: Diagnosis not present

## 2019-04-07 DIAGNOSIS — E1122 Type 2 diabetes mellitus with diabetic chronic kidney disease: Secondary | ICD-10-CM | POA: Diagnosis not present

## 2019-04-08 DIAGNOSIS — N186 End stage renal disease: Secondary | ICD-10-CM | POA: Diagnosis not present

## 2019-04-08 DIAGNOSIS — Z992 Dependence on renal dialysis: Secondary | ICD-10-CM | POA: Diagnosis not present

## 2019-04-09 DIAGNOSIS — K7460 Unspecified cirrhosis of liver: Secondary | ICD-10-CM | POA: Diagnosis not present

## 2019-04-09 DIAGNOSIS — T82534D Leakage of infusion catheter, subsequent encounter: Secondary | ICD-10-CM | POA: Diagnosis not present

## 2019-04-09 DIAGNOSIS — K7581 Nonalcoholic steatohepatitis (NASH): Secondary | ICD-10-CM | POA: Diagnosis not present

## 2019-04-09 DIAGNOSIS — I5030 Unspecified diastolic (congestive) heart failure: Secondary | ICD-10-CM | POA: Diagnosis not present

## 2019-04-09 DIAGNOSIS — K766 Portal hypertension: Secondary | ICD-10-CM | POA: Diagnosis not present

## 2019-04-09 DIAGNOSIS — N186 End stage renal disease: Secondary | ICD-10-CM | POA: Diagnosis not present

## 2019-04-09 DIAGNOSIS — E1122 Type 2 diabetes mellitus with diabetic chronic kidney disease: Secondary | ICD-10-CM | POA: Diagnosis not present

## 2019-04-09 DIAGNOSIS — I132 Hypertensive heart and chronic kidney disease with heart failure and with stage 5 chronic kidney disease, or end stage renal disease: Secondary | ICD-10-CM | POA: Diagnosis not present

## 2019-04-09 DIAGNOSIS — I8501 Esophageal varices with bleeding: Secondary | ICD-10-CM | POA: Diagnosis not present

## 2019-04-09 DIAGNOSIS — R188 Other ascites: Secondary | ICD-10-CM | POA: Diagnosis not present

## 2019-04-09 DIAGNOSIS — Z992 Dependence on renal dialysis: Secondary | ICD-10-CM | POA: Diagnosis not present

## 2019-04-09 DIAGNOSIS — D509 Iron deficiency anemia, unspecified: Secondary | ICD-10-CM | POA: Diagnosis not present

## 2019-04-09 DIAGNOSIS — K922 Gastrointestinal hemorrhage, unspecified: Secondary | ICD-10-CM | POA: Diagnosis not present

## 2019-04-10 ENCOUNTER — Other Ambulatory Visit: Payer: Medicare HMO | Admitting: Internal Medicine

## 2019-04-10 DIAGNOSIS — Z515 Encounter for palliative care: Secondary | ICD-10-CM | POA: Diagnosis not present

## 2019-04-10 DIAGNOSIS — N186 End stage renal disease: Secondary | ICD-10-CM | POA: Diagnosis not present

## 2019-04-10 DIAGNOSIS — R188 Other ascites: Secondary | ICD-10-CM | POA: Diagnosis not present

## 2019-04-10 DIAGNOSIS — K766 Portal hypertension: Secondary | ICD-10-CM | POA: Diagnosis not present

## 2019-04-10 DIAGNOSIS — T82534D Leakage of infusion catheter, subsequent encounter: Secondary | ICD-10-CM | POA: Diagnosis not present

## 2019-04-10 DIAGNOSIS — I8501 Esophageal varices with bleeding: Secondary | ICD-10-CM | POA: Diagnosis not present

## 2019-04-10 DIAGNOSIS — K922 Gastrointestinal hemorrhage, unspecified: Secondary | ICD-10-CM | POA: Diagnosis not present

## 2019-04-10 DIAGNOSIS — E1122 Type 2 diabetes mellitus with diabetic chronic kidney disease: Secondary | ICD-10-CM | POA: Diagnosis not present

## 2019-04-10 DIAGNOSIS — K7460 Unspecified cirrhosis of liver: Secondary | ICD-10-CM | POA: Diagnosis not present

## 2019-04-10 DIAGNOSIS — Z992 Dependence on renal dialysis: Secondary | ICD-10-CM | POA: Diagnosis not present

## 2019-04-10 DIAGNOSIS — K7581 Nonalcoholic steatohepatitis (NASH): Secondary | ICD-10-CM | POA: Diagnosis not present

## 2019-04-10 DIAGNOSIS — I5030 Unspecified diastolic (congestive) heart failure: Secondary | ICD-10-CM | POA: Diagnosis not present

## 2019-04-10 DIAGNOSIS — I132 Hypertensive heart and chronic kidney disease with heart failure and with stage 5 chronic kidney disease, or end stage renal disease: Secondary | ICD-10-CM | POA: Diagnosis not present

## 2019-04-11 ENCOUNTER — Encounter: Payer: Self-pay | Admitting: Internal Medicine

## 2019-04-11 DIAGNOSIS — Z992 Dependence on renal dialysis: Secondary | ICD-10-CM | POA: Diagnosis not present

## 2019-04-11 DIAGNOSIS — N186 End stage renal disease: Secondary | ICD-10-CM | POA: Diagnosis not present

## 2019-04-11 NOTE — Progress Notes (Signed)
Aug 11th, 2020 [facility] Palliative Care Consult Note Telephone: [phone]  Fax: [phone]    PATIENT NAME: [name] DOB: 02-Nov-1940 MRN: [id number] Home: [address] [place], [place] porch with hot air balloon and swing. Phone: Patient's daughter [name] [phone]   PRIMARY CARE PROVIDER:   Caryl Bis, MD   REFERRING PROVIDER:  Caryl Bis, MD [place],  [place] [address]   RESPONSIBLE PARTY: (daughter) [name], (dtr) Brayton Mars [phone], (Dtr) Roxanne Hammack [phone], (Nxt door neighbor) Maudry Mayhew [phone].   ASSESSMENT / RECOMMENDATIONS:   1.Advanced Care Planning:  a. Advance Care Directives: DNR/MOST on chart. She has the 2 DNR forms I had mailed to her; one on fridge and one over the bed. I filled out a MOST form for them to keep in the home. Verified details with both patient and her 3 daughters.   b. Goals of Care: Family meeting today. Patient resides in her own little apartment and wants to stay there. There is tension among the 3 daughters regarding distribution of care. [name] is the PCG, assuming responsibility for hooking patient up to peritoneal dialysis each evening and discontinuing q am. [name] is the most comfortable with patients personal care needs and trouble-shooting emergency situations. [name] covers the majority of time, including overnights and weekends. The two other daughters Mariann Laster and Lupita Dawn feel less adapt. They also feel consumed and over-whelmed by their own family needs and feel minimally able to chip in either financially or time wise. Mariann Laster and Roxanne expressed that their preference would be for patient to be placed in a SNF where she could get the care she needed, without their involvement. Mariann Laster and Roxanne expressed understanding that they would be unlikely to be able to visit their mom, d/t COVID restrictions. They also appreciate that  patients prognosis is likely 6 months to a year.  Daughter [name] is trying to full-fill patients wishes to remain in apartment with 24/7 care, but is feeling overwhelmed and misses being in her own home with her husband. We tossed about a few ideas to help even out the load, including resourcing a very nice neighbor who lives in the housing complex, and is often able to volunteer her time and care. Ultimately [name] and Lupita Dawn somewhat reluctantly agree to spend some nights/week over night in patients apt, to allow [name] to be at her home. They agreed to decide between the two of them which 2 nights they could cover/week to allow [name] time to herself. Patients sister is also available to cover some hours/week, and neighbor Mariann Laster states she could chip in, as well.   c.To keep patient in her apartment with 24/7 care.  d. [place] trip in August if possible; daughter [name] orchestrating. e. Medicaid application ongoing. [name] plans to call to see if her providers have send in the supporting medical documentation. f. [place] ([place] was following with SN, bath aide twice a week; I forgot to inquire if those services are still in place. Patient has a church family but limited contacts d/t COVID. Patient has always been very independent in her life; finds it difficult to ask for/receive help.  2. Symptom management: weakness/low BP after 4th sequential peritoneal dialysis. She is wondering if the nephrologist would be amenable to decreasing the frequency, or even doing a combination of hemodialysis with the peritoneal. Patient preferentially needs to continue peritoneal dialysis to address ascites accumulation from liver  cirrhosis. She asked that I approach the nephrologist regarding this, and I will do so.   3. Cognitive / Functional decline r/t ESRD (started peritoneal dialysis) and Liver Cirrhosis: Patient has a spot on her lung discovered during evaluation for pneumonia suspicious for  carcinoma; no further w/u or evaluation planned d/t patients debilitated condition. Patients weight is 99lbs. At a height of 52 her BMI is 18.1 kg/m2. She c/o emesis and indigestion. Patient is becoming increasingly weak thought r/t dehydration from emesis and diarrhea (lactulose). She needs stand by assist to ambulate and is a 1-2-person transfer.   4.Follow up Palliative Care Visit: within the next 3-4 weeks.   I spent 90 minutes providing this consultation from 6 pm to 7:30 pm. More than 50% of the time in this consultation was spent coordinating communication.    HISTORY OF PRESENT ILLNESS:  [name] is a 78 y.o. female with/o ESRD (peritoneal dialysis), anemia of chronic disease (not on epogen d/t lung mass. Txs 2 [place] PRBCs July 2020 hosp adm), liver cirrhosis (lactulose), lung nodule, DM, esophageal varices, ascites, migraine headaches, gastritis/duodenitis, GERD, hiatal hernia, hypercholesterolemia, HTN, portal HTN, renal cell carcinoma, Takotsubo cardiomyopathy. Hospital admission 7/18-7/23/2020 for hyperkalemia; ongoing abdominal wound (MRSA; intraperitoneal vancomycin and oral Bactrim; discharged on 10 day course doxycycline. New RUL 1.8 cmlung nodule by CT scan (pulm recommended f/u OP bronchoscopy; OP pulm f/u). Needs GI f/u as OP. [place] admission 7/31-04/06/2019; treated for colitis (transverse and ascending colon). Incidental finding complex bilateral renal masses, and R ovarian mass. Rec f/u US. Txd 1 u PRBC. Post tx Hgb 7.2. This is a f/u Palliative Care visit from last encounter 03/30/2019, to assist family in establishing goals of care.   CODE STATUS: DNR and Most form on [facility] EMR/VYNCA. Details of MOST: DNR/DNI. Limited scope of medical interventions. Yes to antibiotics and IVFs. No to Feeding tube   PPS: weak 40%   HOSPICE ELIGIBILITY/DIAGNOSIS: Yes/ESRD, liver cirrhosis, were she to discontinue peritoneal dialysis.  PAST MEDICAL HISTORY:  Past Medical History:    Diagnosis Date   10 year risk of MI or stroke 7.5% or greater    Anemia    Ascites    Bursitis    CKD (chronic kidney disease)    Diabetes mellitus ([place])    Esophageal varices (HCC)    ESRD (end stage renal disease) ([place]) 12/2018   to start dialysis   FHx: migraine headaches    Gastritis and duodenitis    GERD (gastroesophageal reflux disease)    Hiatal hernia    Hypercholesterolemia    Hypertension    Kidney stone    Liver cirrhosis ([place])    Melena    Portal hypertension (HCC)    Renal cell carcinoma ([place])    Renal lithiasis    Takotsubo cardiomyopathy 06/2013    SOCIAL HX:  Social History   Tobacco Use   Smoking status: Former Smoker    Packs/day: 1.00    Years: 43.00    Pack years: 43.00    Types: Cigarettes    Start date: 08/31/1959    Quit date: 08/30/2002    Years since quitting: 16.6   Smokeless tobacco: Never Used   Tobacco comment: Quit x 10 years  Substance Use Topics   Alcohol use: No    ALLERGIES:  Allergies  Allergen Reactions   Codeine Other (See Comments)    Reaction unknown      PERTINENT MEDICATIONS:  Outpatient Encounter Medications as of 04/10/2019  Medication Sig  acetaminophen (TYLENOL) 500 MG tablet Take 500 mg by mouth every 6 (six) hours as needed for mild pain or fever.   Amino Acids-Protein Hydrolys (FEEDING SUPPLEMENT, PRO-STAT SUGAR FREE 64,) LIQD Take 30 mLs by mouth 2 (two) times daily.   B Complex-C-Folic Acid (NEPHRO VITAMINS) 0.8 MG TABS Take 1 tablet by mouth daily.   buPROPion (WELLBUTRIN SR) 100 MG 12 hr tablet Take 100 mg by mouth daily.    cholecalciferol (VITAMIN D) 1000 UNITS tablet Take 2,000 Units by mouth daily.   FLUoxetine (PROZAC) 40 MG capsule Take 40 mg by mouth daily.   lactulose (CHRONULAC) 10 GM/15ML solution Take 10 g by mouth 2 (two) times daily.   midodrine (PROAMATINE) 10 MG tablet Take 10 mg by mouth 3 (three) times daily after meals.   Multiple Vitamins-Minerals  (PRESERVISION AREDS) TABS Take 1 tablet by mouth daily.    ondansetron (ZOFRAN) 4 MG tablet Take 4 mg by mouth every 6 (six) hours as needed.   pantoprazole (PROTONIX) 40 MG tablet Take 40 mg by mouth 2 (two) times daily.   traZODone (DESYREL) 50 MG tablet Take 100 mg by mouth at bedtime.    vitamin B-12 (CYANOCOBALAMIN) 500 MCG tablet Take 500 mcg by mouth daily.   No facility-administered encounter medications on file as of 04/10/2019.     PHYSICAL EXAM:   Very sweet elderly, frail appearing female reclining on couch. She needed 1 person assist to stand and guiding assist to ambulate.  No dyspnea with conversation. No obvious joint deformities or LE edema. Exposed skin without rashes. Limited physical exam d/t social distancing and family request.    Julianne Handler, NP

## 2019-04-12 DIAGNOSIS — K7581 Nonalcoholic steatohepatitis (NASH): Secondary | ICD-10-CM | POA: Diagnosis not present

## 2019-04-12 DIAGNOSIS — C641 Malignant neoplasm of right kidney, except renal pelvis: Secondary | ICD-10-CM | POA: Diagnosis not present

## 2019-04-12 DIAGNOSIS — T82534D Leakage of infusion catheter, subsequent encounter: Secondary | ICD-10-CM | POA: Diagnosis not present

## 2019-04-12 DIAGNOSIS — Z992 Dependence on renal dialysis: Secondary | ICD-10-CM | POA: Diagnosis not present

## 2019-04-12 DIAGNOSIS — D500 Iron deficiency anemia secondary to blood loss (chronic): Secondary | ICD-10-CM | POA: Diagnosis not present

## 2019-04-12 DIAGNOSIS — K7460 Unspecified cirrhosis of liver: Secondary | ICD-10-CM | POA: Diagnosis not present

## 2019-04-12 DIAGNOSIS — I8501 Esophageal varices with bleeding: Secondary | ICD-10-CM | POA: Diagnosis not present

## 2019-04-12 DIAGNOSIS — R188 Other ascites: Secondary | ICD-10-CM | POA: Diagnosis not present

## 2019-04-12 DIAGNOSIS — K766 Portal hypertension: Secondary | ICD-10-CM | POA: Diagnosis not present

## 2019-04-12 DIAGNOSIS — K922 Gastrointestinal hemorrhage, unspecified: Secondary | ICD-10-CM | POA: Diagnosis not present

## 2019-04-12 DIAGNOSIS — I5030 Unspecified diastolic (congestive) heart failure: Secondary | ICD-10-CM | POA: Diagnosis not present

## 2019-04-12 DIAGNOSIS — E1122 Type 2 diabetes mellitus with diabetic chronic kidney disease: Secondary | ICD-10-CM | POA: Diagnosis not present

## 2019-04-12 DIAGNOSIS — E782 Mixed hyperlipidemia: Secondary | ICD-10-CM | POA: Diagnosis not present

## 2019-04-12 DIAGNOSIS — N186 End stage renal disease: Secondary | ICD-10-CM | POA: Diagnosis not present

## 2019-04-12 DIAGNOSIS — I132 Hypertensive heart and chronic kidney disease with heart failure and with stage 5 chronic kidney disease, or end stage renal disease: Secondary | ICD-10-CM | POA: Diagnosis not present

## 2019-04-12 DIAGNOSIS — I10 Essential (primary) hypertension: Secondary | ICD-10-CM | POA: Diagnosis not present

## 2019-04-12 DIAGNOSIS — N281 Cyst of kidney, acquired: Secondary | ICD-10-CM | POA: Diagnosis not present

## 2019-04-13 DIAGNOSIS — Z992 Dependence on renal dialysis: Secondary | ICD-10-CM | POA: Diagnosis not present

## 2019-04-13 DIAGNOSIS — N186 End stage renal disease: Secondary | ICD-10-CM | POA: Diagnosis not present

## 2019-04-14 DIAGNOSIS — N186 End stage renal disease: Secondary | ICD-10-CM | POA: Diagnosis not present

## 2019-04-14 DIAGNOSIS — Z992 Dependence on renal dialysis: Secondary | ICD-10-CM | POA: Diagnosis not present

## 2019-04-15 DIAGNOSIS — N186 End stage renal disease: Secondary | ICD-10-CM | POA: Diagnosis not present

## 2019-04-15 DIAGNOSIS — Z992 Dependence on renal dialysis: Secondary | ICD-10-CM | POA: Diagnosis not present

## 2019-04-16 ENCOUNTER — Telehealth: Payer: Self-pay | Admitting: Internal Medicine

## 2019-04-16 DIAGNOSIS — I2510 Atherosclerotic heart disease of native coronary artery without angina pectoris: Secondary | ICD-10-CM | POA: Diagnosis not present

## 2019-04-16 DIAGNOSIS — I132 Hypertensive heart and chronic kidney disease with heart failure and with stage 5 chronic kidney disease, or end stage renal disease: Secondary | ICD-10-CM | POA: Diagnosis not present

## 2019-04-16 DIAGNOSIS — K7460 Unspecified cirrhosis of liver: Secondary | ICD-10-CM | POA: Diagnosis not present

## 2019-04-16 DIAGNOSIS — K766 Portal hypertension: Secondary | ICD-10-CM | POA: Diagnosis not present

## 2019-04-16 DIAGNOSIS — I5030 Unspecified diastolic (congestive) heart failure: Secondary | ICD-10-CM | POA: Diagnosis not present

## 2019-04-16 DIAGNOSIS — R188 Other ascites: Secondary | ICD-10-CM | POA: Diagnosis not present

## 2019-04-16 DIAGNOSIS — K7581 Nonalcoholic steatohepatitis (NASH): Secondary | ICD-10-CM | POA: Diagnosis not present

## 2019-04-16 DIAGNOSIS — Z992 Dependence on renal dialysis: Secondary | ICD-10-CM | POA: Diagnosis not present

## 2019-04-16 DIAGNOSIS — I700 Atherosclerosis of aorta: Secondary | ICD-10-CM | POA: Diagnosis not present

## 2019-04-16 DIAGNOSIS — N186 End stage renal disease: Secondary | ICD-10-CM | POA: Diagnosis not present

## 2019-04-16 DIAGNOSIS — I252 Old myocardial infarction: Secondary | ICD-10-CM | POA: Diagnosis not present

## 2019-04-16 DIAGNOSIS — E875 Hyperkalemia: Secondary | ICD-10-CM | POA: Diagnosis not present

## 2019-04-16 NOTE — Telephone Encounter (Signed)
04/16/2019 11:40 am:  Left voice message with Dr. Piedad Climes (nephrologist) office nurse, passing on request by PCG daughter [name]. [name] is requesting decrease frequency of peritoneal dialysis treatments, from QD, to qd x 4 days, then off one day, then repeat. [name] reports that patient seems very weak and dizzy by the 4 th treatment. Decreasing would also help to simplify patient's home care giver schedule.  Violeta Gelinas NP-C [phone]

## 2019-04-17 DIAGNOSIS — N186 End stage renal disease: Secondary | ICD-10-CM | POA: Diagnosis not present

## 2019-04-17 DIAGNOSIS — Z992 Dependence on renal dialysis: Secondary | ICD-10-CM | POA: Diagnosis not present

## 2019-04-18 DIAGNOSIS — N186 End stage renal disease: Secondary | ICD-10-CM | POA: Diagnosis not present

## 2019-04-18 DIAGNOSIS — R634 Abnormal weight loss: Secondary | ICD-10-CM | POA: Diagnosis not present

## 2019-04-18 DIAGNOSIS — Z992 Dependence on renal dialysis: Secondary | ICD-10-CM | POA: Diagnosis not present

## 2019-04-19 ENCOUNTER — Emergency Department: Payer: Medicare HMO

## 2019-04-19 ENCOUNTER — Inpatient Hospital Stay
Admission: EM | Admit: 2019-04-19 | Discharge: 2019-04-23 | DRG: 871 | Disposition: A | Discharge status: Home Health | Payer: Medicare HMO | Attending: Internal Medicine | Admitting: Internal Medicine

## 2019-04-19 ENCOUNTER — Encounter: Payer: Self-pay | Admitting: *Deleted

## 2019-04-19 ENCOUNTER — Other Ambulatory Visit: Payer: Self-pay

## 2019-04-19 DIAGNOSIS — E1122 Type 2 diabetes mellitus with diabetic chronic kidney disease: Secondary | ICD-10-CM | POA: Diagnosis present

## 2019-04-19 DIAGNOSIS — Z6820 Body mass index (BMI) 20.0-20.9, adult: Secondary | ICD-10-CM

## 2019-04-19 DIAGNOSIS — K922 Gastrointestinal hemorrhage, unspecified: Secondary | ICD-10-CM | POA: Diagnosis not present

## 2019-04-19 DIAGNOSIS — I700 Atherosclerosis of aorta: Secondary | ICD-10-CM | POA: Diagnosis not present

## 2019-04-19 DIAGNOSIS — A419 Sepsis, unspecified organism: Secondary | ICD-10-CM | POA: Diagnosis not present

## 2019-04-19 DIAGNOSIS — R188 Other ascites: Secondary | ICD-10-CM | POA: Diagnosis present

## 2019-04-19 DIAGNOSIS — Z79899 Other long term (current) drug therapy: Secondary | ICD-10-CM

## 2019-04-19 DIAGNOSIS — E876 Hypokalemia: Secondary | ICD-10-CM | POA: Diagnosis present

## 2019-04-19 DIAGNOSIS — E7800 Pure hypercholesterolemia, unspecified: Secondary | ICD-10-CM | POA: Diagnosis present

## 2019-04-19 DIAGNOSIS — I2510 Atherosclerotic heart disease of native coronary artery without angina pectoris: Secondary | ICD-10-CM | POA: Diagnosis not present

## 2019-04-19 DIAGNOSIS — K529 Noninfective gastroenteritis and colitis, unspecified: Secondary | ICD-10-CM | POA: Diagnosis present

## 2019-04-19 DIAGNOSIS — Z90711 Acquired absence of uterus with remaining cervical stump: Secondary | ICD-10-CM

## 2019-04-19 DIAGNOSIS — Z66 Do not resuscitate: Secondary | ICD-10-CM | POA: Diagnosis present

## 2019-04-19 DIAGNOSIS — N2581 Secondary hyperparathyroidism of renal origin: Secondary | ICD-10-CM | POA: Diagnosis present

## 2019-04-19 DIAGNOSIS — I252 Old myocardial infarction: Secondary | ICD-10-CM

## 2019-04-19 DIAGNOSIS — Z885 Allergy status to narcotic agent status: Secondary | ICD-10-CM

## 2019-04-19 DIAGNOSIS — K766 Portal hypertension: Secondary | ICD-10-CM | POA: Diagnosis not present

## 2019-04-19 DIAGNOSIS — E871 Hypo-osmolality and hyponatremia: Secondary | ICD-10-CM | POA: Diagnosis present

## 2019-04-19 DIAGNOSIS — E43 Unspecified severe protein-calorie malnutrition: Secondary | ICD-10-CM | POA: Diagnosis present

## 2019-04-19 DIAGNOSIS — T82534D Leakage of infusion catheter, subsequent encounter: Secondary | ICD-10-CM | POA: Diagnosis not present

## 2019-04-19 DIAGNOSIS — Z800 Family history of malignant neoplasm of digestive organs: Secondary | ICD-10-CM

## 2019-04-19 DIAGNOSIS — I509 Heart failure, unspecified: Secondary | ICD-10-CM | POA: Diagnosis present

## 2019-04-19 DIAGNOSIS — E860 Dehydration: Secondary | ICD-10-CM | POA: Diagnosis present

## 2019-04-19 DIAGNOSIS — R1084 Generalized abdominal pain: Secondary | ICD-10-CM | POA: Diagnosis not present

## 2019-04-19 DIAGNOSIS — K7460 Unspecified cirrhosis of liver: Secondary | ICD-10-CM | POA: Diagnosis present

## 2019-04-19 DIAGNOSIS — Z87891 Personal history of nicotine dependence: Secondary | ICD-10-CM

## 2019-04-19 DIAGNOSIS — I132 Hypertensive heart and chronic kidney disease with heart failure and with stage 5 chronic kidney disease, or end stage renal disease: Secondary | ICD-10-CM | POA: Diagnosis present

## 2019-04-19 DIAGNOSIS — Z85528 Personal history of other malignant neoplasm of kidney: Secondary | ICD-10-CM

## 2019-04-19 DIAGNOSIS — E875 Hyperkalemia: Secondary | ICD-10-CM | POA: Diagnosis not present

## 2019-04-19 DIAGNOSIS — K7581 Nonalcoholic steatohepatitis (NASH): Secondary | ICD-10-CM | POA: Diagnosis not present

## 2019-04-19 DIAGNOSIS — Z20828 Contact with and (suspected) exposure to other viral communicable diseases: Secondary | ICD-10-CM | POA: Diagnosis present

## 2019-04-19 DIAGNOSIS — I5030 Unspecified diastolic (congestive) heart failure: Secondary | ICD-10-CM | POA: Diagnosis not present

## 2019-04-19 DIAGNOSIS — D631 Anemia in chronic kidney disease: Secondary | ICD-10-CM | POA: Diagnosis present

## 2019-04-19 DIAGNOSIS — K219 Gastro-esophageal reflux disease without esophagitis: Secondary | ICD-10-CM | POA: Diagnosis present

## 2019-04-19 DIAGNOSIS — G9341 Metabolic encephalopathy: Secondary | ICD-10-CM | POA: Diagnosis present

## 2019-04-19 DIAGNOSIS — N186 End stage renal disease: Secondary | ICD-10-CM | POA: Diagnosis present

## 2019-04-19 DIAGNOSIS — Z992 Dependence on renal dialysis: Secondary | ICD-10-CM

## 2019-04-19 DIAGNOSIS — I8510 Secondary esophageal varices without bleeding: Secondary | ICD-10-CM | POA: Diagnosis present

## 2019-04-19 DIAGNOSIS — R161 Splenomegaly, not elsewhere classified: Secondary | ICD-10-CM | POA: Diagnosis present

## 2019-04-19 DIAGNOSIS — Z87442 Personal history of urinary calculi: Secondary | ICD-10-CM

## 2019-04-19 DIAGNOSIS — I8501 Esophageal varices with bleeding: Secondary | ICD-10-CM | POA: Diagnosis not present

## 2019-04-19 LAB — PROTIME-INR
INR: 1.1 (ref 0.8–1.2)
Prothrombin Time: 14.3 seconds (ref 11.4–15.2)

## 2019-04-19 LAB — CBC WITH DIFFERENTIAL/PLATELET
Abs Immature Granulocytes: 0.81 10*3/uL — ABNORMAL HIGH (ref 0.00–0.07)
Basophils Absolute: 0.1 10*3/uL (ref 0.0–0.1)
Basophils Relative: 0 %
Eosinophils Absolute: 0.3 10*3/uL (ref 0.0–0.5)
Eosinophils Relative: 1 %
HCT: 21.7 % — ABNORMAL LOW (ref 36.0–46.0)
Hemoglobin: 7.2 g/dL — ABNORMAL LOW (ref 12.0–15.0)
Immature Granulocytes: 4 %
Lymphocytes Relative: 15 %
Lymphs Abs: 3 10*3/uL (ref 0.7–4.0)
MCH: 28.8 pg (ref 26.0–34.0)
MCHC: 33.2 g/dL (ref 30.0–36.0)
MCV: 86.8 fL (ref 80.0–100.0)
Monocytes Absolute: 3.5 10*3/uL — ABNORMAL HIGH (ref 0.1–1.0)
Monocytes Relative: 18 %
Neutro Abs: 12.2 10*3/uL — ABNORMAL HIGH (ref 1.7–7.7)
Neutrophils Relative %: 62 %
Platelets: 147 10*3/uL — ABNORMAL LOW (ref 150–400)
RBC: 2.5 MIL/uL — ABNORMAL LOW (ref 3.87–5.11)
RDW: 15.4 % (ref 11.5–15.5)
WBC: 19.8 10*3/uL — ABNORMAL HIGH (ref 4.0–10.5)
nRBC: 0 % (ref 0.0–0.2)

## 2019-04-19 LAB — COMPREHENSIVE METABOLIC PANEL
ALT: 28 U/L (ref 0–44)
AST: 29 U/L (ref 15–41)
Albumin: 2.5 g/dL — ABNORMAL LOW (ref 3.5–5.0)
Alkaline Phosphatase: 127 U/L — ABNORMAL HIGH (ref 38–126)
Anion gap: 19 — ABNORMAL HIGH (ref 5–15)
BUN: 99 mg/dL — ABNORMAL HIGH (ref 8–23)
CO2: 16 mmol/L — ABNORMAL LOW (ref 22–32)
Calcium: 7 mg/dL — ABNORMAL LOW (ref 8.9–10.3)
Chloride: 86 mmol/L — ABNORMAL LOW (ref 98–111)
Creatinine, Ser: 10.37 mg/dL — ABNORMAL HIGH (ref 0.44–1.00)
GFR calc Af Amer: 4 mL/min — ABNORMAL LOW (ref >60)
GFR calc non Af Amer: 3 mL/min — ABNORMAL LOW (ref >60)
Glucose, Bld: 93 mg/dL (ref 70–99)
Potassium: 3.6 mmol/L (ref 3.5–5.1)
Sodium: 121 mmol/L — ABNORMAL LOW (ref 135–145)
Total Bilirubin: 0.6 mg/dL (ref 0.3–1.2)
Total Protein: 4.8 g/dL — ABNORMAL LOW (ref 6.5–8.1)

## 2019-04-19 LAB — LIPASE, BLOOD: Lipase: 56 U/L — ABNORMAL HIGH (ref 11–51)

## 2019-04-19 LAB — LACTIC ACID, PLASMA: Lactic Acid, Venous: 1.6 mmol/L (ref 0.5–1.9)

## 2019-04-19 LAB — APTT: aPTT: 34 seconds (ref 24–36)

## 2019-04-19 MED ORDER — SODIUM CHLORIDE 0.9 % IV SOLN
2.0000 g | Freq: Once | INTRAVENOUS | Status: AC
  Administered 2019-04-19: 2 g via INTRAVENOUS
  Filled 2019-04-19: qty 20

## 2019-04-19 MED ORDER — FENTANYL CITRATE (PF) 100 MCG/2ML IJ SOLN
50.0000 ug | Freq: Once | INTRAMUSCULAR | Status: AC
  Administered 2019-04-19: 50 ug via INTRAVENOUS
  Filled 2019-04-19: qty 2

## 2019-04-19 MED ORDER — VANCOMYCIN HCL IN DEXTROSE 1-5 GM/200ML-% IV SOLN
1000.0000 mg | Freq: Once | INTRAVENOUS | Status: AC
  Administered 2019-04-20: 1000 mg via INTRAVENOUS
  Filled 2019-04-19: qty 200

## 2019-04-19 MED ORDER — SODIUM CHLORIDE 0.9 % IV BOLUS
500.0000 mL | Freq: Once | INTRAVENOUS | Status: AC
  Administered 2019-04-19: 500 mL via INTRAVENOUS

## 2019-04-19 MED ORDER — ONDANSETRON HCL 4 MG/2ML IJ SOLN
4.0000 mg | Freq: Once | INTRAMUSCULAR | Status: AC
  Administered 2019-04-19: 4 mg via INTRAVENOUS
  Filled 2019-04-19: qty 2

## 2019-04-19 NOTE — ED Notes (Signed)
Pt does peritoneal dialysis everyday but did not do it yesterday after talking with kidney doctor

## 2019-04-19 NOTE — ED Provider Notes (Signed)
[facility] Emergency Department Provider Note  ____________________________________________   First MD Initiated Contact with Patient 04/19/19 2158     (approximate)  I have reviewed the triage vital signs and the nursing notes.   HISTORY  Chief Complaint Diarrhea and Abdominal Pain    HPI [name] is a 78 y.o. female with renal cell carcinoma, liver cirrhosis, prior kidney stones, ESRD who presents with diarrhea and vomiting and abdominal pain.  Patient missed dialysis today.  Denies any shortness of breath that is new.  Pt presents with abd pain that is severe, constant, started a few days ago, nothing makes it better or worse.  She gets peritoneal dialysis, did not have done today.  Also some hip pain from bursitis.           Past Medical History:  Diagnosis Date   10 year risk of MI or stroke 7.5% or greater    Anemia    Ascites    Bursitis    CKD (chronic kidney disease)    Diabetes mellitus ([place])    Esophageal varices ([place])    ESRD (end stage renal disease) ([place]) 12/2018   to start dialysis   FHx: migraine headaches    Gastritis and duodenitis    GERD (gastroesophageal reflux disease)    Hiatal hernia    Hypercholesterolemia    Hypertension    Kidney stone    Liver cirrhosis ([place])    Melena    Portal hypertension (HCC)    Renal cell carcinoma ([place])    Renal lithiasis    Takotsubo cardiomyopathy 06/2013    Patient Active Problem List   Diagnosis Date Noted   PAF (paroxysmal atrial fibrillation) ([place]) 04/01/2019   Abdominal pain 03/30/2019   Goals of care, counseling/discussion    Palliative care by specialist    ESRD (end stage renal disease) on dialysis [facility])    Other cirrhosis of liver ([place])    Lung nodule    Anemia due to chronic kidney disease, on chronic dialysis ([place])    Hyperkalemia 03/17/2019   Abnormal thyroid blood test 11/07/2018   Uncontrolled type 2 diabetes  mellitus with hyperglycemia ([place]) 07/25/2018   Hyperthyroidism 07/05/2018   Melena 10/08/2013   Cardiomyopathy ([place]) 07/12/2013   Physical deconditioning 07/06/2013   NSTEMI (non-ST elevated myocardial infarction) ([place]) 07/02/2013   Community acquired pneumonia 07/02/2013   Sepsis(995.91) 07/02/2013   Acute respiratory failure ([place]) 16/96/7893   Acute systolic CHF (congestive heart failure) ([place]) 07/02/2013   10 year risk of MI or stroke 7.5% or greater    HEMOCCULT POSITIVE STOOL 05/15/2009   ANEMIA, IRON DEFICIENCY, HX OF 05/15/2009   DM 05/14/2009   HYPERCHOLESTEROLEMIA 05/14/2009   MIGRAINE HEADACHE 05/14/2009   HYPERTENSION 05/14/2009   GERD 05/14/2009   CHOLELITHIASIS 05/14/2009   NEPHROLITHIASIS 05/14/2009   BURSITIS 05/14/2009    Past Surgical History:  Procedure Laterality Date   ABDOMINAL HYSTERECTOMY     BACK SURGERY     CHOLECYSTECTOMY     COLONOSCOPY  06/15/2005   RMR: Minimal internal hemorrhoids. Otherwise normal rectum/Normal colon   COLONOSCOPY    12/14/2001   RMR: Internal hemorrhage, otherwise, normal rectum/ Polyps at 30 and 35 cm resected; the remainder of colonic mucosa appeared normal   COLONOSCOPY  05/2009   RMR: minimal hemorrhoids, sigmoid hyperplastic polyp   COLONOSCOPY N/A 10/24/2013   YBO:FBPZWCHE colonic polyps removed as described above/Colonic diverticulosis   DIALYSIS/PERMA CATHETER INSERTION N/A 04/02/2019   Procedure: DIALYSIS/PERMA CATHETER EXCHANGE;  Surgeon: Algernon Huxley, MD;  Location: [facility];  Service: Cardiovascular;  Laterality: N/A;   ESOPHAGEAL VARICE LIGATION  12/2018   [facility]   ESOPHAGOGASTRODUODENOSCOPY  06/12/2007   QJJ:HERDEY esophagus/patulous EG junction, moderate sized  hiatal hernia, otherwise normal stomach, D1, D2   ESOPHAGOGASTRODUODENOSCOPY N/A 10/24/2013   RMR: Small hiatal hernia; otherwise, normal EGD   HIP SURGERY     right   LEFT HEART CATHETERIZATION WITH CORONARY  ANGIOGRAM N/A 07/02/2013   Procedure: LEFT HEART CATHETERIZATION WITH CORONARY ANGIOGRAM;  Surgeon: Josue Hector, MD;  Location: [facility] CATH LAB;  Service: Cardiovascular;  Laterality: N/A;   PARTIAL HYSTERECTOMY     TENCKHOFF CATHETER INSERTION      Prior to Admission medications   Medication Sig Start Date End Date Taking? Authorizing Provider  acetaminophen (TYLENOL) 500 MG tablet Take 500 mg by mouth every 6 (six) hours as needed for mild pain or fever.    [provider]  Amino Acids-Protein Hydrolys (FEEDING SUPPLEMENT, PRO-STAT SUGAR FREE 64,) LIQD Take 30 mLs by mouth 2 (two) times daily. 04/06/19 05/06/19  Stark Jock Jude, MD  B Complex-C-Folic Acid (NEPHRO VITAMINS) 0.8 MG TABS Take 1 tablet by mouth daily. 01/19/19   [provider]  buPROPion (WELLBUTRIN SR) 100 MG 12 hr tablet Take 100 mg by mouth daily.     [provider]  cholecalciferol (VITAMIN D) 1000 UNITS tablet Take 2,000 Units by mouth daily.    [provider]  FLUoxetine (PROZAC) 40 MG capsule Take 40 mg by mouth daily.    [provider]  lactulose (CHRONULAC) 10 GM/15ML solution Take 10 g by mouth 2 (two) times daily.    [provider]  midodrine (PROAMATINE) 10 MG tablet Take 10 mg by mouth 3 (three) times daily after meals. 02/26/19   [provider]  Multiple Vitamins-Minerals (PRESERVISION AREDS) TABS Take 1 tablet by mouth daily.     [provider]  ondansetron (ZOFRAN) 4 MG tablet Take 4 mg by mouth every 6 (six) hours as needed. 03/29/19   [provider]  pantoprazole (PROTONIX) 40 MG tablet Take 40 mg by mouth 2 (two) times daily. 02/26/19   [provider]  traZODone (DESYREL) 50 MG tablet Take 100 mg by mouth at bedtime.     [provider]  vitamin B-12 (CYANOCOBALAMIN) 500 MCG tablet Take 500 mcg by mouth daily.    [provider]    Allergies Codeine  Family History  Problem Relation Age of Onset    Pancreatic cancer Father 37   Colon cancer Mother        late 69s    Social History Social History   Tobacco Use   Smoking status: Former Smoker    Packs/day: 1.00    Years: 43.00    Pack years: 43.00    Types: Cigarettes    Start date: 08/31/1959    Quit date: 08/30/2002    Years since quitting: 16.6   Smokeless tobacco: Never Used   Tobacco comment: Quit x 10 years  Substance Use Topics   Alcohol use: No   Drug use: No      Review of Systems Constitutional: No fever/chills Eyes: No visual changes. ENT: No sore throat. Cardiovascular: Denies chest pain. Respiratory: Denies shortness of breath. Gastrointestinal:+ abd pain. No constipation. Genitourinary: Negative for dysuria. Musculoskeletal: Negative for back pain. + hip pain Skin: Negative for rash. Neurological: Negative for headaches, focal weakness or numbness. All other ROS negative  ____________________________________________   PHYSICAL EXAM:  VITAL SIGNS: ED Triage Vitals  Enc Vitals Group     BP 04/19/19 2154 (!) 95/47     Pulse Rate 04/19/19 2154 96     Resp 04/19/19 2154 20     Temp 04/19/19 2154 99.1 F (37.3 C)     Temp Source 04/19/19 2154 Oral     SpO2 04/19/19 2154 99 %     Weight 04/19/19 2156 93 lb (42.2 kg)     Height 04/19/19 2156 5\' 2"  (1.575 m)     Head Circumference --      Peak Flow --      Pain Score 04/19/19 2155 8     Pain Loc --      Pain Edu? --      Excl. in [place]? --     Constitutional: Alert and oriented. Appears in pain.  Eyes: Conjunctivae are normal. EOMI. Head: Atraumatic. Nose: No congestion/rhinnorhea. Mouth/Throat: Mucous membranes are moist.   Neck: No stridor. Trachea Midline. FROM Cardiovascular: Normal rate, regular rhythm. Grossly normal heart sounds.  Good peripheral circulation. Catheter in the R Chest wall.  Respiratory: Normal respiratory effort.  No retractions. Lungs CTAB. Gastrointestinal: tender in the abd, peritoneal catheter.  Musculoskeletal:  No lower extremity tenderness nor edema.  No joint effusions. NO erythema or warmth over the hip joints.  Neurologic:  Normal speech and language. No gross focal neurologic deficits are appreciated.  Skin:  Skin is warm, dry and intact. No rash noted. Psychiatric: Mood and affect are normal. Speech and behavior are normal. GU: Deferred   ____________________________________________   LABS (all labs ordered are listed, but only abnormal results are displayed)  Labs Reviewed  CBC WITH DIFFERENTIAL/PLATELET - Abnormal; Notable for the following components:      Result Value   WBC 19.8 (*)    RBC 2.50 (*)    Hemoglobin 7.2 (*)    HCT 21.7 (*)    Platelets 147 (*)    Neutro Abs 12.2 (*)    Monocytes Absolute 3.5 (*)    Abs Immature Granulocytes 0.81 (*)    All other components within normal limits  COMPREHENSIVE METABOLIC PANEL - Abnormal; Notable for the following components:   Sodium 121 (*)    Chloride 86 (*)    CO2 16 (*)    BUN 99 (*)    Creatinine, Ser 10.37 (*)    Calcium 7.0 (*)    Total Protein 4.8 (*)    Albumin 2.5 (*)    Alkaline Phosphatase 127 (*)    GFR calc non Af Amer 3 (*)    GFR calc Af Amer 4 (*)    Anion gap 19 (*)    All other components within normal limits  LIPASE, BLOOD - Abnormal; Notable for the following components:   Lipase 56 (*)    All other components within normal limits  BODY FLUID CULTURE  CULTURE, BLOOD (ROUTINE X 2)  CULTURE, BLOOD (ROUTINE X 2)  SARS CORONAVIRUS 2  PROTIME-INR  APTT  LACTIC ACID, PLASMA  BODY FLUID CELL COUNT WITH DIFFERENTIAL  GLUCOSE, PLEURAL OR PERITONEAL FLUID  PROTEIN, PLEURAL OR PERITONEAL FLUID  LACTATE DEHYDROGENASE, PLEURAL OR PERITONEAL FLUID   ____________________________________________     ____________________________________________  RADIOLOGY Robert Bellow, personally viewed and evaluated these images (plain radiographs) as part of my medical decision making, as well as reviewing the written  report by the radiologist.   Official radiology report(s): Ct Abdomen Pelvis Wo Contrast  Result Date: 04/19/2019 CLINICAL DATA:  Abdominal distension with abdominal pain. EXAM: CT ABDOMEN AND PELVIS WITHOUT CONTRAST TECHNIQUE: Multidetector CT imaging of the abdomen and pelvis was performed following the standard protocol without IV contrast. COMPARISON:  CT dated March 30, 2019 FINDINGS: Lower chest: The lung bases are clear. The heart size is normal. Hepatobiliary: The liver is cirrhotic. There is no discrete hepatic mass, however evaluation is limited by lack of IV contrast. Status post cholecystectomy.There is no biliary ductal dilation. Pancreas: Normal Spleen: The spleen is enlarged. Adrenals/Urinary Tract: --Adrenal glands: No adrenal hemorrhage. --Right kidney/ureter: Again noted are multiple complex appearing cystic structures involving the right kidney. There is no right-sided hydronephrosis. --Left kidney/ureter: The left kidney demonstrates multiple complex cystic structures including a hyperdense 2.6 cm mass arising from the lower pole. There is no hydronephrosis. There may be nonobstructing stones. --Urinary bladder: Decompressed and therefore poorly evaluated. Stomach/Bowel: --Stomach/Duodenum: There is some wall thickening of the distal esophagus. --Small bowel: There are some mildly dilated loops of small bowel in the upper abdomen. These are suboptimally evaluated in the absence of both IV and oral contrast. There is no evidence for a frank bowel obstruction. There is some mild wall thickening at the level of the hepatic flexure. --Colon: No focal abnormality. --Appendix: The appendix is not reliably identified. Vascular/Lymphatic: Atherosclerotic calcification is present within the non-aneurysmal abdominal aorta, without hemodynamically significant stenosis. --there are mildly prominent retroperitoneal lymph nodes. --No mesenteric lymphadenopathy. --No pelvic or inguinal lymphadenopathy.  Reproductive: Status post hysterectomy. No adnexal mass. The previously described 2.7 cm right adnexal mass likely represents the right ovary as opposed to a mass. No further follow-up is required for the finding. Other: There is a small to moderate volume of free fluid in the abdomen, slightly improved from prior study. A peritoneal dialysis catheter is again noted. There is no free air. Musculoskeletal. Patient is status post prior L3-L4 posterior fusion. There are interbody spacers at the L3-L4 and L4-L5 levels. There is no displaced fracture. IMPRESSION: 1. Evaluation limited by a lack of both oral and IV contrast. 2. Possible mildly dilated loops of small bowel in the left upper quadrant without evidence for a frank small bowel obstruction. Findings may represent an enteritis in the appropriate clinical setting. This is not well evaluated in the absence of contrast. 3. Wall thickening of the distal esophagus may represent esophagitis in the appropriate clinical setting. 4. Cirrhosis with sequela of portal hypertension including a small volume of abdominal ascites. 5. Unchanged positioning of the peritoneal dialysis catheter. 6. Again noted are complex renal masses bilaterally, not well evaluated on this examination. Follow-up with an outpatient renal ultrasound is recommended for further evaluation. Electronically Signed   By: Constance Holster M.D.   On: 04/19/2019 22:47    ____________________________________________   PROCEDURES  Procedure(s) performed (including Critical Care):  .Critical Care Performed by: Vanessa [place], MD Authorized by: Vanessa [place], MD   Critical care provider statement:    Critical care time (minutes):  30   Critical care was necessary to treat or prevent imminent or life-threatening deterioration of the following conditions:  Sepsis   Critical care was time spent personally by me on the following activities:  Discussions with consultants, evaluation of patient's  response to treatment, examination of patient, ordering and performing treatments and interventions, ordering and review of laboratory studies, ordering and review of radiographic studies, pulse oximetry, re-evaluation of patient's condition, obtaining history from patient or surrogate and review of old charts  ____________________________________________   INITIAL IMPRESSION / ASSESSMENT AND PLAN / ED COURSE  SULLY MANZI was evaluated in Emergency Department on 04/19/2019 for the symptoms described in the history of present illness. She was evaluated in the context of the global COVID-19 pandemic, which necessitated consideration that the patient might be at risk for infection with the SARS-CoV-2 virus that causes COVID-19. Institutional protocols and algorithms that pertain to the evaluation of patients at risk for COVID-19 are in a state of rapid change based on information released by regulatory bodies including the CDC and federal and state organizations. These policies and algorithms were followed during the patient's care in the ED.    Pt presents with severe abd pain. Consider peritonitis given peritoneal dialysis.  Will get sample.  Will get CT scan to evaluate for abd infection like appendicitis, abscess.  No worsening SOB to suggest PNA. Will get electrolytes given missed dialysis.   Labs notable for low NA.    White count significant elevated.  Given pts low blood pressures will do sepsis alert and start on broad antibiotics.  Will not give 30 cc/kg given dialysis pt. Pt baseline pressures in the high 90s-100s. Will start off with 500 cc fluid.   Pt handed off to incoming team pending CT.  Will need admission after CT results.       ____________________________________________   FINAL CLINICAL IMPRESSION(S) / ED DIAGNOSES   Final diagnoses:  Generalized abdominal pain  Sepsis, due to unspecified organism, unspecified whether acute organ dysfunction present [facility])        MEDICATIONS GIVEN DURING THIS VISIT:  Medications  vancomycin (VANCOCIN) IVPB 1000 mg/200 mL premix (has no administration in time range)  fentaNYL (SUBLIMAZE) injection 50 mcg (50 mcg Intravenous Given 04/19/19 2220)  ondansetron (ZOFRAN) injection 4 mg (4 mg Intravenous Given 04/19/19 2219)  cefTRIAXone (ROCEPHIN) 2 g in sodium chloride 0.9 % 100 mL IVPB (0 g Intravenous Stopped 04/20/19 0049)  fentaNYL (SUBLIMAZE) injection 50 mcg (50 mcg Intravenous Given 04/19/19 2349)  sodium chloride 0.9 % bolus 500 mL (0 mLs Intravenous Stopped 04/20/19 0049)     ED Discharge Orders    None       Note:  This document was prepared using Dragon voice recognition software and may include unintentional dictation errors.   Vanessa [place], MD 04/20/19 [phone]

## 2019-04-19 NOTE — ED Triage Notes (Addendum)
Pt reporting lower abd pain x 3 days with diarrhea and vomiting. ! Episode of vomiting today. Pt is tearful in triage and appears uncomfortable. Pt is a Tues, Thurs, sat dialysis patient but did not go today. No SOB noted at this time. No fevers reported at home. Pt has been able to keep fluids down throughout the day.   Pt reporting her hips are hurting at this time as well.

## 2019-04-19 NOTE — ED Notes (Signed)
Call to dialysis nurse to obtain peritoneal fluid

## 2019-04-19 NOTE — Progress Notes (Addendum)
CODE SEPSIS - PHARMACY COMMUNICATION  **Broad Spectrum Antibiotics should be administered within 1 hour of Sepsis diagnosis**  Time Code Sepsis Called/Page Received: 2341  Antibiotics Ordered: Vancomycin and Rocephin  Time of 1st antibiotic administration: 2321  Additional action taken by pharmacy:   If necessary, Name of Provider/Nurse Contacted:    Ena Dawley ,PharmD Clinical Pharmacist  04/19/2019  11:42 PM

## 2019-04-20 ENCOUNTER — Ambulatory Visit: Payer: Medicare HMO | Admitting: Gastroenterology

## 2019-04-20 ENCOUNTER — Other Ambulatory Visit: Payer: Self-pay

## 2019-04-20 DIAGNOSIS — K7460 Unspecified cirrhosis of liver: Secondary | ICD-10-CM | POA: Diagnosis present

## 2019-04-20 DIAGNOSIS — N2581 Secondary hyperparathyroidism of renal origin: Secondary | ICD-10-CM | POA: Diagnosis not present

## 2019-04-20 DIAGNOSIS — R188 Other ascites: Secondary | ICD-10-CM | POA: Diagnosis present

## 2019-04-20 DIAGNOSIS — K529 Noninfective gastroenteritis and colitis, unspecified: Secondary | ICD-10-CM | POA: Diagnosis not present

## 2019-04-20 DIAGNOSIS — G9341 Metabolic encephalopathy: Secondary | ICD-10-CM | POA: Diagnosis not present

## 2019-04-20 DIAGNOSIS — N186 End stage renal disease: Secondary | ICD-10-CM | POA: Diagnosis not present

## 2019-04-20 DIAGNOSIS — I8510 Secondary esophageal varices without bleeding: Secondary | ICD-10-CM | POA: Diagnosis not present

## 2019-04-20 DIAGNOSIS — D631 Anemia in chronic kidney disease: Secondary | ICD-10-CM | POA: Diagnosis not present

## 2019-04-20 DIAGNOSIS — R161 Splenomegaly, not elsewhere classified: Secondary | ICD-10-CM | POA: Diagnosis present

## 2019-04-20 DIAGNOSIS — K7581 Nonalcoholic steatohepatitis (NASH): Secondary | ICD-10-CM | POA: Diagnosis present

## 2019-04-20 DIAGNOSIS — E1122 Type 2 diabetes mellitus with diabetic chronic kidney disease: Secondary | ICD-10-CM | POA: Diagnosis present

## 2019-04-20 DIAGNOSIS — K766 Portal hypertension: Secondary | ICD-10-CM | POA: Diagnosis not present

## 2019-04-20 DIAGNOSIS — E871 Hypo-osmolality and hyponatremia: Secondary | ICD-10-CM | POA: Diagnosis not present

## 2019-04-20 DIAGNOSIS — Z6820 Body mass index (BMI) 20.0-20.9, adult: Secondary | ICD-10-CM | POA: Diagnosis not present

## 2019-04-20 DIAGNOSIS — Z20828 Contact with and (suspected) exposure to other viral communicable diseases: Secondary | ICD-10-CM | POA: Diagnosis not present

## 2019-04-20 DIAGNOSIS — E872 Acidosis: Secondary | ICD-10-CM | POA: Diagnosis not present

## 2019-04-20 DIAGNOSIS — R1084 Generalized abdominal pain: Secondary | ICD-10-CM | POA: Diagnosis not present

## 2019-04-20 DIAGNOSIS — Z992 Dependence on renal dialysis: Secondary | ICD-10-CM | POA: Diagnosis not present

## 2019-04-20 DIAGNOSIS — E43 Unspecified severe protein-calorie malnutrition: Secondary | ICD-10-CM | POA: Diagnosis present

## 2019-04-20 DIAGNOSIS — E860 Dehydration: Secondary | ICD-10-CM | POA: Diagnosis present

## 2019-04-20 DIAGNOSIS — E876 Hypokalemia: Secondary | ICD-10-CM | POA: Diagnosis not present

## 2019-04-20 DIAGNOSIS — I10 Essential (primary) hypertension: Secondary | ICD-10-CM | POA: Diagnosis not present

## 2019-04-20 DIAGNOSIS — I959 Hypotension, unspecified: Secondary | ICD-10-CM | POA: Diagnosis not present

## 2019-04-20 DIAGNOSIS — I132 Hypertensive heart and chronic kidney disease with heart failure and with stage 5 chronic kidney disease, or end stage renal disease: Secondary | ICD-10-CM | POA: Diagnosis not present

## 2019-04-20 DIAGNOSIS — A419 Sepsis, unspecified organism: Secondary | ICD-10-CM | POA: Diagnosis not present

## 2019-04-20 DIAGNOSIS — Z66 Do not resuscitate: Secondary | ICD-10-CM | POA: Diagnosis not present

## 2019-04-20 LAB — PROTEIN, PLEURAL OR PERITONEAL FLUID: Total protein, fluid: <3 g/dL

## 2019-04-20 LAB — BODY FLUID CELL COUNT WITH DIFFERENTIAL
Eos, Fluid: 0 %
Lymphs, Fluid: 40 %
Monocyte-Macrophage-Serous Fluid: 45 %
Neutrophil Count, Fluid: 15 %
Other Cells, Fluid: 0 %
Total Nucleated Cell Count, Fluid: 132 cu mm

## 2019-04-20 LAB — CREATININE, SERUM
Creatinine, Ser: 4.82 mg/dL — ABNORMAL HIGH (ref 0.44–1.00)
GFR calc Af Amer: 9 mL/min — ABNORMAL LOW (ref >60)
GFR calc non Af Amer: 8 mL/min — ABNORMAL LOW (ref >60)

## 2019-04-20 LAB — SARS CORONAVIRUS 2 BY RT PCR (HOSPITAL ORDER, PERFORMED IN ~~LOC~~ HOSPITAL LAB): SARS Coronavirus 2: NEGATIVE

## 2019-04-20 LAB — LACTATE DEHYDROGENASE, PLEURAL OR PERITONEAL FLUID: LD, Fluid: 39 U/L — ABNORMAL HIGH (ref 3–23)

## 2019-04-20 LAB — GLUCOSE, PLEURAL OR PERITONEAL FLUID: Glucose, Fluid: 92 mg/dL

## 2019-04-20 LAB — TSH: TSH: 2.789 u[IU]/mL (ref 0.350–4.500)

## 2019-04-20 MED ORDER — PANTOPRAZOLE SODIUM 40 MG IV SOLR
40.0000 mg | INTRAVENOUS | Status: DC
  Administered 2019-04-20 – 2019-04-22 (×3): 40 mg via INTRAVENOUS
  Filled 2019-04-20 (×3): qty 40

## 2019-04-20 MED ORDER — MIDODRINE HCL 5 MG PO TABS
10.0000 mg | ORAL_TABLET | Freq: Three times a day (TID) | ORAL | Status: DC
  Administered 2019-04-20 – 2019-04-23 (×9): 10 mg via ORAL
  Filled 2019-04-20 (×9): qty 2

## 2019-04-20 MED ORDER — HYDROCODONE-ACETAMINOPHEN 5-325 MG PO TABS
1.0000 | ORAL_TABLET | ORAL | Status: DC | PRN
  Administered 2019-04-20 – 2019-04-22 (×7): 1 via ORAL
  Filled 2019-04-20 (×8): qty 1

## 2019-04-20 MED ORDER — METRONIDAZOLE IN NACL 5-0.79 MG/ML-% IV SOLN
500.0000 mg | Freq: Three times a day (TID) | INTRAVENOUS | Status: DC
  Administered 2019-04-20 – 2019-04-23 (×9): 500 mg via INTRAVENOUS
  Filled 2019-04-20 (×13): qty 100

## 2019-04-20 MED ORDER — CHLORHEXIDINE GLUCONATE CLOTH 2 % EX PADS
6.0000 | MEDICATED_PAD | Freq: Every day | CUTANEOUS | Status: DC
  Administered 2019-04-21 – 2019-04-23 (×3): 6 via TOPICAL

## 2019-04-20 MED ORDER — ONDANSETRON HCL 4 MG PO TABS: 4.0000 mg | ORAL_TABLET | Freq: Four times a day (QID) | ORAL | Status: DC | PRN

## 2019-04-20 MED ORDER — SODIUM CHLORIDE 0.9 % IV SOLN
1.0000 g | INTRAVENOUS | Status: DC
  Administered 2019-04-20 – 2019-04-22 (×3): 1 g via INTRAVENOUS
  Filled 2019-04-20 (×2): qty 1
  Filled 2019-04-20: qty 10
  Filled 2019-04-20: qty 1

## 2019-04-20 MED ORDER — ACETAMINOPHEN 650 MG RE SUPP: 650.0000 mg | Freq: Four times a day (QID) | RECTAL | Status: DC | PRN

## 2019-04-20 MED ORDER — HEPARIN SODIUM (PORCINE) 5000 UNIT/ML IJ SOLN
5000.0000 [IU] | Freq: Three times a day (TID) | INTRAMUSCULAR | Status: DC
  Administered 2019-04-20 – 2019-04-23 (×8): 5000 [IU] via SUBCUTANEOUS
  Filled 2019-04-20 (×8): qty 1

## 2019-04-20 MED ORDER — DOCUSATE SODIUM 100 MG PO CAPS
100.0000 mg | ORAL_CAPSULE | Freq: Two times a day (BID) | ORAL | Status: DC
  Administered 2019-04-21 – 2019-04-23 (×3): 100 mg via ORAL
  Filled 2019-04-20 (×3): qty 1

## 2019-04-20 MED ORDER — MORPHINE SULFATE (PF) 2 MG/ML IV SOLN
2.0000 mg | INTRAVENOUS | Status: DC | PRN
  Administered 2019-04-20 (×2): 2 mg via INTRAVENOUS
  Filled 2019-04-20 (×2): qty 1

## 2019-04-20 MED ORDER — ONDANSETRON HCL 4 MG/2ML IJ SOLN
4.0000 mg | Freq: Four times a day (QID) | INTRAMUSCULAR | Status: DC | PRN
  Administered 2019-04-23: 4 mg via INTRAVENOUS
  Filled 2019-04-20 (×2): qty 2

## 2019-04-20 MED ORDER — ACETAMINOPHEN 325 MG PO TABS: 650.0000 mg | ORAL_TABLET | Freq: Four times a day (QID) | ORAL | Status: DC | PRN

## 2019-04-20 MED ORDER — FENTANYL CITRATE (PF) 100 MCG/2ML IJ SOLN
50.0000 ug | Freq: Once | INTRAMUSCULAR | Status: AC
  Administered 2019-04-20: 50 ug via INTRAVENOUS
  Filled 2019-04-20: qty 2

## 2019-04-20 NOTE — Progress Notes (Signed)
Family Meeting Note  Advance Directive:yes  Today a meeting took place with the Patient.   The following clinical team members were present during this meeting:MD  The following were discussed:Patient's diagnosis: End-stage renal disease, cancer with metastasis, enteritis, malnutrition, Patient's progosis: Unable to determine and Goals for treatment: DNR  Additional follow-up to be provided: nephrology, Gastroenterology  Time spent during discussion:20 minutes  Vaughan Basta, MD

## 2019-04-20 NOTE — ED Notes (Signed)
This RN made 2nd attempt to contact Dialysis for peritoneal fluid collection. RN informed that no one available at this time to collect. Dialysis aware that pt to be admitted and need for collect.

## 2019-04-20 NOTE — Progress Notes (Signed)
Established peritoneal dialysis patient known at [facility] Phillip Heal). Please contact me directly with any dialysis placement concerns.  Elvera Bicker Dialysis Coordinator [phone]

## 2019-04-20 NOTE — H&P (Signed)
[name] is an 78 y.o. female.   Chief Complaint: Abdominal pain HPI: The patient with past medical history of ESRD on peritoneal dialysis, hypertension, diabetes, cirrhosis, and esophageal varices presents to the emergency department from home due to abdominal pain and diarrhea.  Patient also reports nausea and vomiting.  She denies seeing blood in her stool or emesis.  She also denies fevers.  CT of the patient's abdomen shows possible enteritis and no definite evidence of SBO.  She was given ceftriaxone and vancomycin in the emergency department prior to the hospital service being called for admission.  Past Medical History:  Diagnosis Date  . 10 year risk of MI or stroke 7.5% or greater   . Anemia   . Ascites   . Bursitis   . CKD (chronic kidney disease)   . Diabetes mellitus ([place])   . Esophageal varices ([place])   . ESRD (end stage renal disease) ([place]) 12/2018   to start dialysis  . FHx: migraine headaches   . Gastritis and duodenitis   . GERD (gastroesophageal reflux disease)   . Hiatal hernia   . Hypercholesterolemia   . Hypertension   . Kidney stone   . Liver cirrhosis ([place])   . Melena   . Portal hypertension ([place])   . Renal cell carcinoma ([place])   . Renal lithiasis   . Takotsubo cardiomyopathy 06/2013    Past Surgical History:  Procedure Laterality Date  . ABDOMINAL HYSTERECTOMY    . BACK SURGERY    . CHOLECYSTECTOMY    . COLONOSCOPY  06/15/2005   RMR: Minimal internal hemorrhoids. Otherwise normal rectum/Normal colon  . COLONOSCOPY    12/14/2001   RMR: Internal hemorrhage, otherwise, normal rectum/ Polyps at 30 and 35 cm resected; the remainder of colonic mucosa appeared normal  . COLONOSCOPY  05/2009   RMR: minimal hemorrhoids, sigmoid hyperplastic polyp  . COLONOSCOPY N/A 10/24/2013   BJS:EGBTDVVO colonic polyps removed as described above/Colonic diverticulosis  . DIALYSIS/PERMA CATHETER INSERTION N/A 04/02/2019   Procedure: DIALYSIS/PERMA CATHETER  EXCHANGE;  Surgeon: Algernon Huxley, MD;  Location: [place] [facility];  Service: Cardiovascular;  Laterality: N/A;  . ESOPHAGEAL VARICE LIGATION  12/2018   [facility]  . ESOPHAGOGASTRODUODENOSCOPY  06/12/2007   HYW:VPXTGG esophagus/patulous EG junction, moderate sized  hiatal hernia, otherwise normal stomach, D1, D2  . ESOPHAGOGASTRODUODENOSCOPY N/A 10/24/2013   RMR: Small hiatal hernia; otherwise, normal EGD  . HIP SURGERY     right  . LEFT HEART CATHETERIZATION WITH CORONARY ANGIOGRAM N/A 07/02/2013   Procedure: LEFT HEART CATHETERIZATION WITH CORONARY ANGIOGRAM;  Surgeon: Josue Hector, MD;  Location: [facility];  Service: Cardiovascular;  Laterality: N/A;  . PARTIAL HYSTERECTOMY    . TENCKHOFF CATHETER INSERTION      Family History  Problem Relation Age of Onset  . Pancreatic cancer Father 74  . Colon cancer Mother        late 49s   Social History:  reports that she quit smoking about 16 years ago. Her smoking use included cigarettes. She started smoking about 59 years ago. She has a 43.00 pack-year smoking history. She has never used smokeless tobacco. She reports that she does not drink alcohol or use drugs.  Allergies:  Allergies  Allergen Reactions  . Codeine Other (See Comments)    Reaction unknown     Prior to Admission medications   Medication Sig Start Date End Date Taking? Authorizing Provider  acetaminophen (TYLENOL) 500 MG tablet Take 500 mg by mouth  every 6 (six) hours as needed for mild pain or fever.    [provider]  Amino Acids-Protein Hydrolys (FEEDING SUPPLEMENT, PRO-STAT SUGAR FREE 64,) LIQD Take 30 mLs by mouth 2 (two) times daily. 04/06/19 05/06/19  Stark Jock Jude, MD  B Complex-C-Folic Acid (NEPHRO VITAMINS) 0.8 MG TABS Take 1 tablet by mouth daily. 01/19/19   [provider]  buPROPion (WELLBUTRIN SR) 100 MG 12 hr tablet Take 100 mg by mouth daily.     [provider]  cholecalciferol (VITAMIN D) 1000 UNITS tablet Take 2,000 Units by  mouth daily.    [provider]  FLUoxetine (PROZAC) 40 MG capsule Take 40 mg by mouth daily.    [provider]  lactulose (CHRONULAC) 10 GM/15ML solution Take 10 g by mouth 2 (two) times daily.    [provider]  midodrine (PROAMATINE) 10 MG tablet Take 10 mg by mouth 3 (three) times daily after meals. 02/26/19   [provider]  Multiple Vitamins-Minerals (PRESERVISION AREDS) TABS Take 1 tablet by mouth daily.     [provider]  ondansetron (ZOFRAN) 4 MG tablet Take 4 mg by mouth every 6 (six) hours as needed. 03/29/19   [provider]  pantoprazole (PROTONIX) 40 MG tablet Take 40 mg by mouth 2 (two) times daily. 02/26/19   [provider]  traZODone (DESYREL) 50 MG tablet Take 100 mg by mouth at bedtime.     [provider]  vitamin B-12 (CYANOCOBALAMIN) 500 MCG tablet Take 500 mcg by mouth daily.    [provider]     Results for orders placed or performed during the hospital encounter of 04/19/19 (from the past 48 hour(s))  CBC with Differential     Status: Abnormal   Collection Time: 04/19/19 10:06 PM  Result Value Ref Range   WBC 19.8 (H) 4.0 - 10.5 K/uL   RBC 2.50 (L) 3.87 - 5.11 MIL/uL   Hemoglobin 7.2 (L) 12.0 - 15.0 g/dL   HCT 21.7 (L) 36.0 - 46.0 %   MCV 86.8 80.0 - 100.0 fL   MCH 28.8 26.0 - 34.0 pg   MCHC 33.2 30.0 - 36.0 g/dL   RDW 15.4 11.5 - 15.5 %   Platelets 147 (L) 150 - 400 K/uL   nRBC 0.0 0.0 - 0.2 %   Neutrophils Relative % 62 %   Neutro Abs 12.2 (H) 1.7 - 7.7 K/uL   Lymphocytes Relative 15 %   Lymphs Abs 3.0 0.7 - 4.0 K/uL   Monocytes Relative 18 %   Monocytes Absolute 3.5 (H) 0.1 - 1.0 K/uL   Eosinophils Relative 1 %   Eosinophils Absolute 0.3 0.0 - 0.5 K/uL   Basophils Relative 0 %   Basophils Absolute 0.1 0.0 - 0.1 K/uL   WBC Morphology MORPHOLOGY UNREMARKABLE    RBC Morphology MORPHOLOGY UNREMARKABLE    Smear Review MORPHOLOGY UNREMARKABLE    Immature Granulocytes 4 %    Abs Immature Granulocytes 0.81 (H) 0.00 - 0.07 K/uL    Comment: Performed at [facility], [address] [place], [place] [address]  Comprehensive metabolic panel     Status: Abnormal   Collection Time: 04/19/19 10:06 PM  Result Value Ref Range   Sodium 121 (L) 135 - 145 mmol/L   Potassium 3.6 3.5 - 5.1 mmol/L   Chloride 86 (L) 98 - 111 mmol/L   CO2 16 (L) 22 - 32 mmol/L   Glucose, Bld 93 70 - 99 mg/dL   BUN  99 (H) 8 - 23 mg/dL   Creatinine, Ser 10.37 (H) 0.44 - 1.00 mg/dL   Calcium 7.0 (L) 8.9 - 10.3 mg/dL   Total Protein 4.8 (L) 6.5 - 8.1 g/dL   Albumin 2.5 (L) 3.5 - 5.0 g/dL   AST 29 15 - 41 U/L   ALT 28 0 - 44 U/L   Alkaline Phosphatase 127 (H) 38 - 126 U/L   Total Bilirubin 0.6 0.3 - 1.2 mg/dL   GFR calc non Af Amer 3 (L) >60 mL/min   GFR calc Af Amer 4 (L) >60 mL/min   Anion gap 19 (H) 5 - 15    Comment: Performed at [facility], [address] [place], [place] [address]  Lipase, blood     Status: Abnormal   Collection Time: 04/19/19 10:06 PM  Result Value Ref Range   Lipase 56 (H) 11 - 51 U/L    Comment: Performed at [facility], [address] [place], [place] [address]  Protime-INR     Status: None   Collection Time: 04/19/19 10:06 PM  Result Value Ref Range   Prothrombin Time 14.3 11.4 - 15.2 seconds   INR 1.1 0.8 - 1.2    Comment: (NOTE) INR goal varies based on device and disease states. Performed at [facility] [address] [place], [place] [address]   APTT     Status: None   Collection Time: 04/19/19 10:06 PM  Result Value Ref Range   aPTT 34 24 - 36 seconds    Comment: Performed at [facility], [address] [place], [place] [address]  Lactic acid, plasma     Status: None   Collection Time: 04/19/19 10:16 PM  Result Value Ref Range   Lactic Acid, Venous 1.6 0.5 - 1.9 mmol/L    Comment: Performed at [facility], [address] [place], [place] [address]  SARS Coronavirus 2  [facility] order, Performed in [place] hospital lab)     Status: None   Collection Time: 04/19/19 10:16 PM  Result Value Ref Range   SARS Coronavirus 2 NEGATIVE NEGATIVE    Comment: (NOTE) If result is NEGATIVE SARS-CoV-2 target nucleic acids are NOT DETECTED. The SARS-CoV-2 RNA is generally detectable in upper and lower  respiratory specimens during the acute phase of infection. The lowest  concentration of SARS-CoV-2 viral copies this assay can detect is 250  copies / mL. A negative result does not preclude SARS-CoV-2 infection  and should not be used as the sole basis for treatment or other  patient management decisions.  A negative result may occur with  improper specimen collection / handling, submission of specimen other  than nasopharyngeal swab, presence of viral mutation(s) within the  areas targeted by this assay, and inadequate number of viral copies  (<250 copies / mL). A negative result must be combined with clinical  observations, patient history, and epidemiological information. If result is POSITIVE SARS-CoV-2 target nucleic acids are DETECTED. The SARS-CoV-2 RNA is generally detectable in upper and lower  respiratory specimens dur ing the acute phase of infection.  Positive  results are indicative of active infection with SARS-CoV-2.  Clinical  correlation with patient history and other diagnostic information is  necessary to determine patient infection status.  Positive results do  not rule out bacterial infection or co-infection with other viruses. If result is PRESUMPTIVE POSTIVE SARS-CoV-2 nucleic acids MAY BE PRESENT.   A presumptive positive result was obtained on the submitted specimen  and confirmed on repeat testing.  While 2019 novel coronavirus  (SARS-CoV-2) nucleic acids may be present in the submitted sample  additional confirmatory testing may be necessary for epidemiological  and / or clinical management purposes  to differentiate between   SARS-CoV-2 and other Sarbecovirus currently known to infect humans.  If clinically indicated additional testing with an alternate test  methodology [phone]) is advised. The SARS-CoV-2 RNA is generally  detectable in upper and lower respiratory sp ecimens during the acute  phase of infection. The expected result is Negative. Fact Sheet for Patients:  StrictlyIdeas.no Fact Sheet for Healthcare Providers: BankingDealers.co.za This test is not yet approved or cleared by the [place] FDA and has been authorized for detection and/or diagnosis of SARS-CoV-2 by FDA under an Emergency Use Authorization (EUA).  This EUA will remain in effect (meaning this test can be used) for the duration of the COVID-19 declaration under Section 564(b)(1) of the Act, 21 U.S.C. section 360bbb-3(b)(1), unless the authorization is terminated or revoked sooner. Performed at [facility], [address] [place], [place] [address]    Ct Abdomen Pelvis Wo Contrast  Result Date: 04/19/2019 CLINICAL DATA:  Abdominal distension with abdominal pain. EXAM: CT ABDOMEN AND PELVIS WITHOUT CONTRAST TECHNIQUE: Multidetector CT imaging of the abdomen and pelvis was performed following the standard protocol without IV contrast. COMPARISON:  CT dated March 30, 2019 FINDINGS: Lower chest: The lung bases are clear. The heart size is normal. Hepatobiliary: The liver is cirrhotic. There is no discrete hepatic mass, however evaluation is limited by lack of IV contrast. Status post cholecystectomy.There is no biliary ductal dilation. Pancreas: Normal Spleen: The spleen is enlarged. Adrenals/Urinary Tract: --Adrenal glands: No adrenal hemorrhage. --Right kidney/ureter: Again noted are multiple complex appearing cystic structures involving the right kidney. There is no right-sided hydronephrosis. --Left kidney/ureter: The left kidney demonstrates multiple complex cystic structures  including a hyperdense 2.6 cm mass arising from the lower pole. There is no hydronephrosis. There may be nonobstructing stones. --Urinary bladder: Decompressed and therefore poorly evaluated. Stomach/Bowel: --Stomach/Duodenum: There is some wall thickening of the distal esophagus. --Small bowel: There are some mildly dilated loops of small bowel in the upper abdomen. These are suboptimally evaluated in the absence of both IV and oral contrast. There is no evidence for a frank bowel obstruction. There is some mild wall thickening at the level of the hepatic flexure. --Colon: No focal abnormality. --Appendix: The appendix is not reliably identified. Vascular/Lymphatic: Atherosclerotic calcification is present within the non-aneurysmal abdominal aorta, without hemodynamically significant stenosis. --there are mildly prominent retroperitoneal lymph nodes. --No mesenteric lymphadenopathy. --No pelvic or inguinal lymphadenopathy. Reproductive: Status post hysterectomy. No adnexal mass. The previously described 2.7 cm right adnexal mass likely represents the right ovary as opposed to a mass. No further follow-up is required for the finding. Other: There is a small to moderate volume of free fluid in the abdomen, slightly improved from prior study. A peritoneal dialysis catheter is again noted. There is no free air. Musculoskeletal. Patient is status post prior L3-L4 posterior fusion. There are interbody spacers at the L3-L4 and L4-L5 levels. There is no displaced fracture. IMPRESSION: 1. Evaluation limited by a lack of both oral and IV contrast. 2. Possible mildly dilated loops of small bowel in the left upper quadrant without evidence for a frank small bowel obstruction. Findings may represent an enteritis in the appropriate clinical setting. This is not well evaluated in the absence of contrast. 3. Wall thickening of the distal esophagus  may represent esophagitis in the appropriate clinical setting. 4. Cirrhosis with  sequela of portal hypertension including a small volume of abdominal ascites. 5. Unchanged positioning of the peritoneal dialysis catheter. 6. Again noted are complex renal masses bilaterally, not well evaluated on this examination. Follow-up with an outpatient renal ultrasound is recommended for further evaluation. Electronically Signed   By: Constance Holster M.D.   On: 04/19/2019 22:47    Review of Systems  Constitutional: Negative for chills and fever.  HENT: Negative for sore throat and tinnitus.   Eyes: Negative for blurred vision and redness.  Respiratory: Negative for cough and shortness of breath.   Cardiovascular: Negative for chest pain, palpitations, orthopnea and PND.  Gastrointestinal: Positive for abdominal pain, diarrhea, nausea and vomiting.  Genitourinary: Negative for dysuria, frequency and urgency.  Musculoskeletal: Negative for joint pain and myalgias.  Skin: Negative for rash.       No lesions  Neurological: Negative for speech change, focal weakness and weakness.  Endo/Heme/Allergies: Does not bruise/bleed easily.       No temperature intolerance  Psychiatric/Behavioral: Negative for depression and suicidal ideas.    Blood pressure (!) 91/59, pulse 89, temperature 99.1 F (37.3 C), temperature source Oral, resp. rate (!) 22, height 5\' 2"  (1.575 m), weight 42.2 kg, SpO2 100 %. Physical Exam  Vitals reviewed. Constitutional: She is oriented to person, place, and time. She appears well-developed and well-nourished. No distress.  HENT:  Head: Normocephalic and atraumatic.  Mouth/Throat: Oropharynx is clear and moist.  Eyes: Pupils are equal, round, and reactive to light. Conjunctivae and EOM are normal. No scleral icterus.  Neck: Normal range of motion. Neck supple. No JVD present. No tracheal deviation present. No thyromegaly present.  Cardiovascular: Normal rate, regular rhythm and normal heart sounds. Exam reveals no gallop and no friction rub.  No murmur  heard. Respiratory: Effort normal and breath sounds normal.  GI: Soft. Bowel sounds are normal. She exhibits no distension. There is no abdominal tenderness.  Genitourinary:    Genitourinary Comments: Deferred   Musculoskeletal: Normal range of motion.        General: No edema.  Lymphadenopathy:    She has no cervical adenopathy.  Neurological: She is alert and oriented to person, place, and time. No cranial nerve deficit. She exhibits normal muscle tone.  Skin: Skin is warm and dry. No rash noted. No erythema.  Psychiatric: She has a normal mood and affect. Her behavior is normal. Judgment and thought content normal.     Assessment/Plan 78 year old female admitted for gastroenteritis. 1.  Gastroenteritis: Possible colitis my: Differential diagnosis includes overlying SBP.  Continue ceftriaxone.  It is unclear if we have obtained peritoneal fluid for cytology and culture.  Consult nephrology for continuation of peritoneal dialysis and further guidance regarding infection. 2.  Hypertension: The patient currently has low blood pressure.  Hold antihypertensive medications.   3.  Hyponatremia: Worse than baseline; continue midodrine 4.  DVT prophylaxis: Heparin 5.  GI prophylaxis: None The patient is a DNR.  Time spent on admission orders and patient care approximately 45 minutes  Harrie Foreman, MD 04/20/2019, 7:18 AM

## 2019-04-20 NOTE — ED Notes (Signed)
[facility] number also called and recording states not in service

## 2019-04-20 NOTE — Progress Notes (Signed)
Please note, patient is currently followed by outpatient Palliative through [facility] at home. CMRN Hoy Finlay made aware.  Flo Shanks BSN, RN, [facility] [employer] [phone]

## 2019-04-20 NOTE — Progress Notes (Signed)
PHARMACY -  BRIEF ANTIBIOTIC NOTE   Pharmacy has received consult(s) for Vancomycin from an ED provider.  The patient's profile has been reviewed for ht/wt/allergies/indication/available labs.    One time order(s) placed for Vancomycin 1000mg   Further antibiotics/pharmacy consults should be ordered by admitting physician if indicated.                       Thank you, Hart Robinsons A 04/20/2019  12:32 AM

## 2019-04-20 NOTE — ED Notes (Signed)
Since order placed at 2200 multiple attempts by this RN and secretary to contact dialysis at [phone] without success. EDP and Hospitalist aware

## 2019-04-20 NOTE — Progress Notes (Signed)
MD made aware of BP of 88/45. No new orders at this time. Will continue to monitor.

## 2019-04-20 NOTE — Progress Notes (Signed)
[facility] NAME: [name]    MR#:  [id number]  DATE OF BIRTH:  1940-10-09  SUBJECTIVE:  CHIEF COMPLAINT:   Chief Complaint  Patient presents with  . Diarrhea  . Abdominal Pain   In with abdominal pain and enteritis.  Started on antibiotics.  She is somewhat confused and cannot give much details. REVIEW OF SYSTEMS:  CONSTITUTIONAL: No fever, she have fatigue or weakness.  EYES: No blurred or double vision.  EARS, NOSE, AND THROAT: No tinnitus or ear pain.  RESPIRATORY: No cough, shortness of breath, wheezing or hemoptysis.  CARDIOVASCULAR: No chest pain, orthopnea, edema.  GASTROINTESTINAL: No nausea, vomiting, diarrhea, she have abdominal pain.  GENITOURINARY: No dysuria, hematuria.  ENDOCRINE: No polyuria, nocturia,  HEMATOLOGY: No anemia, easy bruising or bleeding SKIN: No rash or lesion. MUSCULOSKELETAL: No joint pain or arthritis.   NEUROLOGIC: No tingling, numbness, weakness.  PSYCHIATRY: No anxiety or depression.   ROS  DRUG ALLERGIES:   Allergies  Allergen Reactions  . Codeine Other (See Comments)    Reaction unknown     VITALS:  Blood pressure (!) (P) 86/43, pulse (P) 95, temperature (P) 97.8 F (36.6 C), temperature source (P) Oral, resp. rate (P) 18, height 5\' 2"  (1.575 m), weight 42.2 kg, SpO2 (P) 98 %.  PHYSICAL EXAMINATION:  GENERAL:  78 y.o.-year-old patient appears malnourished, lying in the bed with some acute distress.  EYES: Pupils equal, round, reactive to light and accommodation. No scleral icterus. Extraocular muscles intact.  HEENT: Head atraumatic, normocephalic. Oropharynx and nasopharynx clear.  NECK:  Supple, no jugular venous distention. No thyroid enlargement, no tenderness.  LUNGS: Normal breath sounds bilaterally, no wheezing, rales,rhonchi or crepitation. No use of accessory muscles of respiration.  Permacath in place in the right upper chest. CARDIOVASCULAR: S1, S2 normal. No  murmurs, rubs, or gallops.  ABDOMEN: Soft, generalized tender, nondistended. Bowel sounds present. No organomegaly or mass.  Peritoneal dialysis catheter in place EXTREMITIES: No pedal edema, cyanosis, or clubbing.  NEUROLOGIC: Cranial nerves II through XII are intact. Muscle strength 3-4/5 in all extremities. Sensation intact. Gait not checked.  PSYCHIATRIC: The patient is alert and oriented x 1.  SKIN: No obvious rash, lesion, or ulcer.   Physical Exam LABORATORY PANEL:   CBC Recent Labs  Lab 04/19/19 2206  WBC 19.8*  HGB 7.2*  HCT 21.7*  PLT 147*   ------------------------------------------------------------------------------------------------------------------  Chemistries  Recent Labs  Lab 04/19/19 2206  NA 121*  K 3.6  CL 86*  CO2 16*  GLUCOSE 93  BUN 99*  CREATININE 10.37*  CALCIUM 7.0*  AST 29  ALT 28  ALKPHOS 127*  BILITOT 0.6   ------------------------------------------------------------------------------------------------------------------  Cardiac Enzymes No results for input(s): TROPONINI in the last 168 hours. ------------------------------------------------------------------------------------------------------------------  RADIOLOGY:  Ct Abdomen Pelvis Wo Contrast  Result Date: 04/19/2019 CLINICAL DATA:  Abdominal distension with abdominal pain. EXAM: CT ABDOMEN AND PELVIS WITHOUT CONTRAST TECHNIQUE: Multidetector CT imaging of the abdomen and pelvis was performed following the standard protocol without IV contrast. COMPARISON:  CT dated March 30, 2019 FINDINGS: Lower chest: The lung bases are clear. The heart size is normal. Hepatobiliary: The liver is cirrhotic. There is no discrete hepatic mass, however evaluation is limited by lack of IV contrast. Status post cholecystectomy.There is no biliary ductal dilation. Pancreas: Normal Spleen: The spleen is enlarged. Adrenals/Urinary Tract: --Adrenal glands: No adrenal hemorrhage. --Right kidney/ureter: Again  noted are multiple complex appearing cystic structures involving the right  kidney. There is no right-sided hydronephrosis. --Left kidney/ureter: The left kidney demonstrates multiple complex cystic structures including a hyperdense 2.6 cm mass arising from the lower pole. There is no hydronephrosis. There may be nonobstructing stones. --Urinary bladder: Decompressed and therefore poorly evaluated. Stomach/Bowel: --Stomach/Duodenum: There is some wall thickening of the distal esophagus. --Small bowel: There are some mildly dilated loops of small bowel in the upper abdomen. These are suboptimally evaluated in the absence of both IV and oral contrast. There is no evidence for a frank bowel obstruction. There is some mild wall thickening at the level of the hepatic flexure. --Colon: No focal abnormality. --Appendix: The appendix is not reliably identified. Vascular/Lymphatic: Atherosclerotic calcification is present within the non-aneurysmal abdominal aorta, without hemodynamically significant stenosis. --there are mildly prominent retroperitoneal lymph nodes. --No mesenteric lymphadenopathy. --No pelvic or inguinal lymphadenopathy. Reproductive: Status post hysterectomy. No adnexal mass. The previously described 2.7 cm right adnexal mass likely represents the right ovary as opposed to a mass. No further follow-up is required for the finding. Other: There is a small to moderate volume of free fluid in the abdomen, slightly improved from prior study. A peritoneal dialysis catheter is again noted. There is no free air. Musculoskeletal. Patient is status post prior L3-L4 posterior fusion. There are interbody spacers at the L3-L4 and L4-L5 levels. There is no displaced fracture. IMPRESSION: 1. Evaluation limited by a lack of both oral and IV contrast. 2. Possible mildly dilated loops of small bowel in the left upper quadrant without evidence for a frank small bowel obstruction. Findings may represent an enteritis in the  appropriate clinical setting. This is not well evaluated in the absence of contrast. 3. Wall thickening of the distal esophagus may represent esophagitis in the appropriate clinical setting. 4. Cirrhosis with sequela of portal hypertension including a small volume of abdominal ascites. 5. Unchanged positioning of the peritoneal dialysis catheter. 6. Again noted are complex renal masses bilaterally, not well evaluated on this examination. Follow-up with an outpatient renal ultrasound is recommended for further evaluation. Electronically Signed   By: Constance Holster M.D.   On: 04/19/2019 22:47    ASSESSMENT AND PLAN:   Active Problems:   Enteritis  78 year old female admitted for gastroenteritis. 1.  Gastroenteritis:  Continue ceftriaxone and Flagyl. As this is repeated episode I will call GI consult. 2.  Hypertension: The patient currently has low blood pressure.  Hold antihypertensive medications.   3.  Hyponatremia: Worse than baseline; continue midodrine 4.  DVT prophylaxis: Heparin 5.  GI prophylaxis: None 6.  End-stage renal disease on peritoneal dialysis at home-does not seem to be tolerating well in last few days. Call nephrology consult to help in the hospital. 7.  Protein calorie malnutrition Patient appears to be severely malnourished.. With having history of cancer, end-stage renal disease on dialysis, colitis and enteritis, malnourished, generalized weakness-she does not seem to have very good prognosis.  If she does not improve much I will involve palliative care. 8.  Altered mental status-metabolic encephalopathy due to uremia and infection Patient missed peritoneal dialysis for last 2 to 3 days. Nephrologist to help.    All the records are reviewed and case discussed with Care Management/Social Workerr. Management plans discussed with the patient, family and they are in agreement.  CODE STATUS: DNR  TOTAL TIME TAKING CARE OF THIS PATIENT: 35 minutes.     POSSIBLE  D/C IN 2-3 DAYS, DEPENDING ON CLINICAL CONDITION.   Vaughan Basta M.D on 04/20/2019   Between 7am to  6pm - Pager - [phone]  After 6pm go to [email or web address] - password [email or web address] [facility]  Office  [phone]  CC: Primary care physician; Caryl Bis, MD  Note: This dictation was prepared with Dragon dictation along with smaller phrase technology. Any transcriptional errors that result from this process are unintentional.

## 2019-04-20 NOTE — TOC Initial Note (Signed)
Transition of Care [facility]) - Initial/Assessment Note    Patient Details  Name: [name] MRN: [id number] Date of Birth: 08/13/1941  Transition of Care [facility]) CM/SW Contact:    Katrina Stack, RN Phone Number: 04/20/2019, 11:34 AM  Clinical Narrative:                Patient sent to the ED with abdominal pain, nausea and vomiting.  She is currently followed by [facility] and Outpatient palliative.  Spoke with daughter [name].  Patient receives nightly peritoneal dialysis in the home.Patient is followed by  [facility] Octave in [place].  Notified Elvera Bicker with [facility] of admission. Received instruction to hold  treatment 8/19 due to abdominal pain. [name] pays private caregivers to stay with patient at night "when I need a break." Her other two sisters do not provide any support. Patient has a cane and wheelchair. [name] has been staying with patient since March due to the covid outbreak. Notified [facility] of admission   Expected Discharge Plan: [place]     Patient Goals and CMS Choice Patient states their goals for this hospitalization and ongoing recovery are:: "I don't know" CMS Medicare.gov Compare Post Acute Care list provided to:: (NA) Choice offered to / list presented to : NA  Expected Discharge Plan and Services Expected Discharge Plan: [place]   Discharge Planning Services: CM Consult Post Acute Care Choice: [place] arrangements for the past 2 months: Apartment                           HH Arranged: RN, PT, Nurse's Aide, Social Work [employer] Agency: [place] ([place]), Hospice and [place] Date [place]: 04/20/19   Representative spoke with at [place]: Craige Cotta with Outpatient palliative and [place] with [facility]  Prior Living Arrangements/Services Living arrangements for the past 2 months: Apartment Lives with:: Self Patient language and need for interpreter  reviewed:: Yes Do you feel safe going back to the place where you live?: Yes      Need for Family Participation in Patient Care: Yes (Comment) Care giver support system in place?: Yes (comment) Current home services: Home PT, Home RN, Homehealth aide, Other (comment)(Outpatient palliative) Criminal Activity/Legal Involvement Pertinent to Current Situation/Hospitalization: No - Comment as needed  Activities of Daily Living      Permission Sought/Granted Permission sought to share information with : Case Manager Permission granted to share information with : Yes, Verbal Permission Granted(daughter [name])              Emotional Assessment Appearance:: Appears stated age Attitude/Demeanor/Rapport: Lethargic Affect (typically observed): Anxious Orientation: : Oriented to Self, Oriented to Place, Oriented to  Time Alcohol / Substance Use: Not Applicable Psych Involvement: No (comment)  Admission diagnosis:  Diarrhea/dehydrated Patient Active Problem List   Diagnosis Date Noted  . Enteritis 04/20/2019  . PAF (paroxysmal atrial fibrillation) ([place]) 04/01/2019  . Abdominal pain 03/30/2019  . Goals of care, counseling/discussion   . Palliative care by specialist   . ESRD (end stage renal disease) on dialysis ([place])   . Other cirrhosis of liver ([place])   . Lung nodule   . Anemia due to chronic kidney disease, on chronic dialysis ([place])   . Hyperkalemia 03/17/2019  . Abnormal thyroid blood test 11/07/2018  . Uncontrolled type 2 diabetes mellitus with hyperglycemia ([place]) 07/25/2018  . Hyperthyroidism 07/05/2018  . Melena  10/08/2013  . Cardiomyopathy ([place]) 07/12/2013  . Physical deconditioning 07/06/2013  . NSTEMI (non-ST elevated myocardial infarction) ([place]) 07/02/2013  . Community acquired pneumonia 07/02/2013  . Sepsis(995.91) 07/02/2013  . Acute respiratory failure ([place]) 07/02/2013  . Acute systolic CHF (congestive heart failure) ([place]) 07/02/2013  . 10 year risk of MI or stroke  7.5% or greater   . HEMOCCULT POSITIVE STOOL 05/15/2009  . ANEMIA, IRON DEFICIENCY, HX OF 05/15/2009  . DM 05/14/2009  . HYPERCHOLESTEROLEMIA 05/14/2009  . MIGRAINE HEADACHE 05/14/2009  . HYPERTENSION 05/14/2009  . GERD 05/14/2009  . CHOLELITHIASIS 05/14/2009  . NEPHROLITHIASIS 05/14/2009  . BURSITIS 05/14/2009   PCP:  Caryl Bis, MD Pharmacy:   [facility], [address] [place] [address] Phone: [phone] Fax: [phone]     Social Determinants of Health (SDOH) Interventions    Readmission Risk Interventions Readmission Risk Prevention Plan 04/04/2019 03/18/2019  Transportation Screening Complete Complete  Medication Review Press photographer) Complete -  [place] or Home Care Consult Complete -  SW Recovery Care/Counseling Consult Complete -  Palliative Care Screening Not Applicable -  [place] Not Applicable -  Some recent data might be hidden

## 2019-04-20 NOTE — ED Notes (Addendum)
Pt transported to dialysis

## 2019-04-20 NOTE — Consult Note (Signed)
Jonathon Bellows , MD [address] [place], [place], [place], [id number], [place], [place], [place], [address] Phone: [phone]  Fax: [phone]  Consultation  Referring Provider:   ER Primary Care Physician:  Caryl Bis, MD Primary Gastroenterologist:  Dr. Vicente Males          Reason for Consultation:     Abdominal pain   Date of Admission:  04/19/2019 Date of Consultation:  04/20/2019         HPI:   [name] is a 78 y.o. female was last seen by myself on 03/20/2019 at the office.  She carries a diagnosis of decompensated liver cirrhosis with ascites and a history of a variceal bleed in February 2020.She also suffers from end-stage renal disease on hemodialysis.,  CHF, MI, hypertension, RCC and cirrhosis felt secondary to [place].  Last seen by her gastroenterologist on 03/09/2019.  Recent admission in May 2020 for a variceal bleed and underwent band ligation.  Subsequently had a repeat EGD on 02/22/2019 and was found to have a small esophageal varix it was too small to bland, hiatal hernia noted.  Commenced on beta-blocker but was subsequently discontinued due to hypotension.  On lactulose, having loose stools.  She was admitted on 03/17/2019 with cough and weakness.  She was coughing and wheezing and brought up some clear phlegm.  Empirically treated for SBP with Rocephin.  During the process of admission she has been having some hemoptysis. Underwent a CT scan of that this admission and was found to have a spiculated pulmonary nodule in the suprahilar right upper lobe measuring 1.8 cm.  Appears to invade and apical segment of the right upper lobe bronchus.  Concerning for primary lung malignancy.  Cirrhosis, splenomegaly and ascites in the abdomen.  Labs 03/17/2019 hemoglobin of 8 g.  Hemoglobin on 02/26/2019 was 7.6 g, 02/24/2019 was 8.1 g.  I have been consulted for drop in hemoglobin.  On admission her hemoglobin was 8.  0 g and today to 7.7 g.  It is very similar to  what she was on 02/26/2019 and 02/24/2019.  There has been change in her creatinine from 12 on admission to 5.91.  She clearly stated that she has not had any hematemesis.  She has had some hemoptysis.  She has not noted the color of her stool recently.  When I went into her room she was midway through her lunch.  She does not have any problems with her swallowing.  No other complaints at this point of time.   Today she presents to the emergency room with abdominal pain.  Also has some nausea and vomiting.  Results shows that there is possibly mildly dilated loops of small bowel in left upper quadrant without evidence of frank bowel obstruction.  Findings may represent an enteritis.  Wall thickening of the distal esophagus may represent esophagitis.  Features of portal hypertension.  Complex renal masses bilaterally not evaluated well on this examination    Past Medical History:  Diagnosis Date   10 year risk of MI or stroke 7.5% or greater    Anemia    Ascites    Bursitis    CKD (chronic kidney disease)    Diabetes mellitus ([facility])    Esophageal varices (HCC)    ESRD (end stage renal disease) ([place]) 12/2018   to start dialysis   FHx: migraine headaches    Gastritis and duodenitis    GERD (gastroesophageal reflux disease)    Hiatal hernia  Hypercholesterolemia    Hypertension    Kidney stone    Liver cirrhosis ([place])    Melena    Portal hypertension (HCC)    Renal cell carcinoma ([place])    Renal lithiasis    Takotsubo cardiomyopathy 06/2013    Past Surgical History:  Procedure Laterality Date   ABDOMINAL HYSTERECTOMY     BACK SURGERY     CHOLECYSTECTOMY     COLONOSCOPY  06/15/2005   RMR: Minimal internal hemorrhoids. Otherwise normal rectum/Normal colon   COLONOSCOPY    12/14/2001   RMR: Internal hemorrhage, otherwise, normal rectum/ Polyps at 30 and 35 cm resected; the remainder of colonic mucosa appeared normal   COLONOSCOPY  05/2009   RMR: minimal  hemorrhoids, sigmoid hyperplastic polyp   COLONOSCOPY N/A 10/24/2013   GDJ:MEQASTMH colonic polyps removed as described above/Colonic diverticulosis   DIALYSIS/PERMA CATHETER INSERTION N/A 04/02/2019   Procedure: DIALYSIS/PERMA CATHETER EXCHANGE;  Surgeon: Algernon Huxley, MD;  Location: [facility];  Service: Cardiovascular;  Laterality: N/A;   ESOPHAGEAL VARICE LIGATION  12/2018   [facility]   ESOPHAGOGASTRODUODENOSCOPY  06/12/2007   DQQ:IWLNLG esophagus/patulous EG junction, moderate sized  hiatal hernia, otherwise normal stomach, D1, D2   ESOPHAGOGASTRODUODENOSCOPY N/A 10/24/2013   RMR: Small hiatal hernia; otherwise, normal EGD   HIP SURGERY     right   LEFT HEART CATHETERIZATION WITH CORONARY ANGIOGRAM N/A 07/02/2013   Procedure: LEFT HEART CATHETERIZATION WITH CORONARY ANGIOGRAM;  Surgeon: Josue Hector, MD;  Location: [facility] CATH LAB;  Service: Cardiovascular;  Laterality: N/A;   PARTIAL HYSTERECTOMY     TENCKHOFF CATHETER INSERTION      Prior to Admission medications   Medication Sig Start Date End Date Taking? Authorizing Provider  acetaminophen (TYLENOL) 500 MG tablet Take 500 mg by mouth every 6 (six) hours as needed for mild pain or fever.   Yes [provider]  Amino Acids-Protein Hydrolys (FEEDING SUPPLEMENT, PRO-STAT SUGAR FREE 64,) LIQD Take 30 mLs by mouth 2 (two) times daily. 04/06/19 05/06/19 Yes Ojie, Jude, MD  atorvastatin (LIPITOR) 20 MG tablet Take 20 mg by mouth daily.   Yes [provider]  B Complex-C-Folic Acid (NEPHRO VITAMINS) 0.8 MG TABS Take 1 tablet by mouth daily. 01/19/19  Yes [provider]  buPROPion (WELLBUTRIN SR) 100 MG 12 hr tablet Take 100 mg by mouth daily.    Yes [provider]  cholecalciferol (VITAMIN D) 1000 UNITS tablet Take 2,000 Units by mouth daily.   Yes [provider]  FLUoxetine (PROZAC) 40 MG capsule Take 40 mg by mouth daily.   Yes [provider]  lactulose (CHRONULAC) 10  GM/15ML solution Take 10 g by mouth 2 (two) times daily.   Yes [provider]  midodrine (PROAMATINE) 10 MG tablet Take 10 mg by mouth 3 (three) times daily after meals. 02/26/19  Yes [provider]  Multiple Vitamins-Minerals (PRESERVISION AREDS) TABS Take 1 tablet by mouth daily.    Yes [provider]  ondansetron (ZOFRAN) 4 MG tablet Take 4 mg by mouth every 6 (six) hours as needed. 03/29/19  Yes [provider]  pantoprazole (PROTONIX) 40 MG tablet Take 40 mg by mouth 2 (two) times daily. 02/26/19  Yes [provider]  traZODone (DESYREL) 50 MG tablet Take 100 mg by mouth at bedtime.    Yes [provider]  vitamin B-12 (CYANOCOBALAMIN) 500 MCG tablet Take 500 mcg by mouth daily.   Yes [provider]    Family History  Problem Relation Age of Onset   Pancreatic cancer Father 55   Colon cancer Mother        late 48s     Social History   Tobacco Use   Smoking status: Former Smoker    Packs/day: 1.00    Years: 43.00    Pack years: 43.00    Types: Cigarettes    Start date: 08/31/1959    Quit date: 08/30/2002    Years since quitting: 16.6   Smokeless tobacco: Never Used   Tobacco comment: Quit x 10 years  Substance Use Topics   Alcohol use: No   Drug use: No    Allergies as of 04/19/2019 - Review Complete 04/19/2019  Allergen Reaction Noted   Codeine Other (See Comments) 07/02/2013    Review of Systems:    All systems reviewed and negative except where noted in HPI.   Physical Exam:  Vital signs in last 24 hours: Temp:  [99.1 F (37.3 C)] 99.1 F (37.3 C) (08/20 2154) Pulse Rate:  [84-98] 92 (08/21 1200) Resp:  [11-22] 13 (08/21 1200) BP: (84-109)/(45-84) 90/57 (08/21 1200) SpO2:  [95 %-100 %] 98 % (08/21 1200) Weight:  [42.2 kg] 42.2 kg (08/20 2156)   General:   Pleasant, cooperative in NAD Head:  Normocephalic and atraumatic. Eyes:   No icterus.   Conjunctiva pink. PERRLA. Ears:  Normal  auditory acuity. Neck:  Supple; no masses or thyroidomegaly Lungs: Respirations even and unlabored. Lungs clear to auscultation bilaterally.   No wheezes, crackles, or rhonchi.  Heart:  Regular rate and rhythm;  Without murmur, clicks, rubs or gallops Abdomen:  Soft, nondistended, nontender. Normal bowel sounds. No appreciable masses or hepatomegaly.  No rebound or guarding.  Neurologic:  Alert and oriented x3;  grossly normal neurologically. Skin:  Intact without significant lesions or rashes. Cervical Nodes:  No significant cervical adenopathy. Psych:  Alert and cooperative. Normal affect.  LAB RESULTS: Recent Labs    04/19/19 2206  WBC 19.8*  HGB 7.2*  HCT 21.7*  PLT 147*   BMET Recent Labs    04/19/19 2206  NA 121*  K 3.6  CL 86*  CO2 16*  GLUCOSE 93  BUN 99*  CREATININE 10.37*  CALCIUM 7.0*   LFT Recent Labs    04/19/19 2206  PROT 4.8*  ALBUMIN 2.5*  AST 29  ALT 28  ALKPHOS 127*  BILITOT 0.6   PT/INR Recent Labs    04/19/19 2206  LABPROT 14.3  INR 1.1    STUDIES: Ct Abdomen Pelvis Wo Contrast  Result Date: 04/19/2019 CLINICAL DATA:  Abdominal distension with abdominal pain. EXAM: CT ABDOMEN AND PELVIS WITHOUT CONTRAST TECHNIQUE: Multidetector CT imaging of the abdomen and pelvis was performed following the standard protocol without IV contrast. COMPARISON:  CT dated March 30, 2019 FINDINGS: Lower chest: The lung bases are clear. The heart size is normal. Hepatobiliary: The liver is cirrhotic. There is no discrete hepatic mass, however evaluation is limited by lack of IV contrast. Status post cholecystectomy.There is no biliary ductal dilation. Pancreas: Normal Spleen: The spleen is enlarged. Adrenals/Urinary Tract: --Adrenal glands: No adrenal hemorrhage. --Right kidney/ureter: Again noted are multiple complex appearing cystic structures involving the right kidney. There is no right-sided hydronephrosis. --Left kidney/ureter: The left kidney demonstrates  multiple complex cystic structures including a hyperdense 2.6 cm mass arising from the lower pole. There is no hydronephrosis. There may be nonobstructing stones. --Urinary bladder: Decompressed and therefore poorly evaluated. Stomach/Bowel: --Stomach/Duodenum: There is some wall thickening of  the distal esophagus. --Small bowel: There are some mildly dilated loops of small bowel in the upper abdomen. These are suboptimally evaluated in the absence of both IV and oral contrast. There is no evidence for a frank bowel obstruction. There is some mild wall thickening at the level of the hepatic flexure. --Colon: No focal abnormality. --Appendix: The appendix is not reliably identified. Vascular/Lymphatic: Atherosclerotic calcification is present within the non-aneurysmal abdominal aorta, without hemodynamically significant stenosis. --there are mildly prominent retroperitoneal lymph nodes. --No mesenteric lymphadenopathy. --No pelvic or inguinal lymphadenopathy. Reproductive: Status post hysterectomy. No adnexal mass. The previously described 2.7 cm right adnexal mass likely represents the right ovary as opposed to a mass. No further follow-up is required for the finding. Other: There is a small to moderate volume of free fluid in the abdomen, slightly improved from prior study. A peritoneal dialysis catheter is again noted. There is no free air. Musculoskeletal. Patient is status post prior L3-L4 posterior fusion. There are interbody spacers at the L3-L4 and L4-L5 levels. There is no displaced fracture. IMPRESSION: 1. Evaluation limited by a lack of both oral and IV contrast. 2. Possible mildly dilated loops of small bowel in the left upper quadrant without evidence for a frank small bowel obstruction. Findings may represent an enteritis in the appropriate clinical setting. This is not well evaluated in the absence of contrast. 3. Wall thickening of the distal esophagus may represent esophagitis in the appropriate  clinical setting. 4. Cirrhosis with sequela of portal hypertension including a small volume of abdominal ascites. 5. Unchanged positioning of the peritoneal dialysis catheter. 6. Again noted are complex renal masses bilaterally, not well evaluated on this examination. Follow-up with an outpatient renal ultrasound is recommended for further evaluation. Electronically Signed   By: Constance Holster M.D.   On: 04/19/2019 22:47      Impression / Plan:   [name] is a 79 y.o. y/o female with a prior history of decompensated liver cirrhosis.  Presents the emergency room with acute onset abdominal pain.  CT scan shows features of enteritis.  The most common cause of enteritis are infectious sometimes viral sometimes bacterial.  There is always a possibility of inflammatory bowel disease but the findings on the CT scan are not similar to the findings seen on the CAT scan 4 weeks back.  If there is any diarrhea suggest to check stool for GI PCR and C. difficile.  Otherwise I would suggest to provide conservative management with IV fluids antiemetics if needed and analgesia.  Usually self-limiting. Prior CT scan of the abdomen showed colitis.  Prior colonoscopy in 2015 has not shown any colitis.   Thank you for involving me in the care of this patient.      LOS: 0 days   Jonathon Bellows, MD  04/20/2019, 1:20 PM

## 2019-04-20 NOTE — ED Notes (Signed)
ED TO INPATIENT HANDOFF REPORT  ED Nurse Name and Phone #:  Maudie Mercury [phone]  S Name/Age/Gender [name] 78 y.o. female Room/Bed: ED15A/ED15A  Code Status   Code Status: Prior  Home/SNF/Other Home Patient oriented to: A&Ox4  Is this baseline? Yes   Triage Complete: Triage complete  Chief Complaint Diarrhea/dehydrated  Triage Note Pt reporting lower abd pain x 3 days with diarrhea and vomiting. ! Episode of vomiting today. Pt is tearful in triage and appears uncomfortable. Pt is a Tues, Thurs, sat dialysis patient but did not go today. No SOB noted at this time. No fevers reported at home. Pt has been able to keep fluids down throughout the day.   Pt reporting her hips are hurting at this time as well.    Allergies Allergies  Allergen Reactions  . Codeine Other (See Comments)    Reaction unknown     Level of Care/Admitting Diagnosis ED Disposition    ED Disposition Condition [facility] Area: [place] [[id number]]  Level of Care: Med-Surg [16]  Covid Evaluation: Confirmed COVID Negative  Diagnosis: Enteritis [060156]  Admitting Physician: Harrie Foreman [[id number]]  Attending Physician: Harrie Foreman [[id number]]  Estimated length of stay: past midnight tomorrow  Certification:: I certify this patient will need inpatient services for at least 2 midnights  PT Class (Do Not Modify): Inpatient [101]  PT Acc Code (Do Not Modify): Private [1]       B Medical/Surgery History Past Medical History:  Diagnosis Date  . 10 year risk of MI or stroke 7.5% or greater   . Anemia   . Ascites   . Bursitis   . CKD (chronic kidney disease)   . Diabetes mellitus ([place])   . Esophageal varices ([place])   . ESRD (end stage renal disease) ([place]) 12/2018   to start dialysis  . FHx: migraine headaches   . Gastritis and duodenitis   . GERD (gastroesophageal reflux disease)   . Hiatal hernia   . Hypercholesterolemia   . Hypertension   .  Kidney stone   . Liver cirrhosis ([place])   . Melena   . Portal hypertension ([place])   . Renal cell carcinoma ([place])   . Renal lithiasis   . Takotsubo cardiomyopathy 06/2013   Past Surgical History:  Procedure Laterality Date  . ABDOMINAL HYSTERECTOMY    . BACK SURGERY    . CHOLECYSTECTOMY    . COLONOSCOPY  06/15/2005   RMR: Minimal internal hemorrhoids. Otherwise normal rectum/Normal colon  . COLONOSCOPY    12/14/2001   RMR: Internal hemorrhage, otherwise, normal rectum/ Polyps at 30 and 35 cm resected; the remainder of colonic mucosa appeared normal  . COLONOSCOPY  05/2009   RMR: minimal hemorrhoids, sigmoid hyperplastic polyp  . COLONOSCOPY N/A 10/24/2013   LKH:[id number] colonic polyps removed as described above/Colonic diverticulosis  . DIALYSIS/PERMA CATHETER INSERTION N/A 04/02/2019   Procedure: DIALYSIS/PERMA CATHETER EXCHANGE;  Surgeon: Algernon Huxley, MD;  Location: [facility];  Service: Cardiovascular;  Laterality: N/A;  . ESOPHAGEAL VARICE LIGATION  12/2018   [facility]  . ESOPHAGOGASTRODUODENOSCOPY  06/12/2007   JQD:UKRCVK esophagus/patulous EG junction, moderate sized  hiatal hernia, otherwise normal stomach, D1, D2  . ESOPHAGOGASTRODUODENOSCOPY N/A 10/24/2013   RMR: Small hiatal hernia; otherwise, normal EGD  . HIP SURGERY     right  . LEFT HEART CATHETERIZATION WITH CORONARY ANGIOGRAM N/A 07/02/2013   Procedure: LEFT HEART CATHETERIZATION WITH CORONARY ANGIOGRAM;  Surgeon: Josue Hector,  MD;  Location: [facility];  Service: Cardiovascular;  Laterality: N/A;  . PARTIAL HYSTERECTOMY    . [place]       A IV Location/Drains/Wounds Patient Lines/Drains/Airways Status   Active Line/Drains/Airways    Name:   Placement date:   Placement time:   Site:   Days:   Peripheral IV 04/19/19 Left Antecubital   04/19/19    2219    Antecubital   1   Peripheral IV 04/20/19 Left Forearm   04/20/19    0100    Forearm   less than 1   Hemodialysis Catheter Right  Subclavian Double lumen Permanent (Tunneled)   04/02/19    1411    Subclavian   18          Intake/Output Last 24 hours No intake or output data in the 24 hours ending 04/20/19 1603  Labs/Imaging Results for orders placed or performed during the hospital encounter of 04/19/19 (from the past 48 hour(s))  CBC with Differential     Status: Abnormal   Collection Time: 04/19/19 10:06 PM  Result Value Ref Range   WBC 19.8 (H) 4.0 - 10.5 K/uL   RBC 2.50 (L) 3.87 - 5.11 MIL/uL   Hemoglobin 7.2 (L) 12.0 - 15.0 g/dL   HCT 21.7 (L) 36.0 - 46.0 %   MCV 86.8 80.0 - 100.0 fL   MCH 28.8 26.0 - 34.0 pg   MCHC 33.2 30.0 - 36.0 g/dL   RDW 15.4 11.5 - 15.5 %   Platelets 147 (L) 150 - 400 K/uL   nRBC 0.0 0.0 - 0.2 %   Neutrophils Relative % 62 %   Neutro Abs 12.2 (H) 1.7 - 7.7 K/uL   Lymphocytes Relative 15 %   Lymphs Abs 3.0 0.7 - 4.0 K/uL   Monocytes Relative 18 %   Monocytes Absolute 3.5 (H) 0.1 - 1.0 K/uL   Eosinophils Relative 1 %   Eosinophils Absolute 0.3 0.0 - 0.5 K/uL   Basophils Relative 0 %   Basophils Absolute 0.1 0.0 - 0.1 K/uL   WBC Morphology MORPHOLOGY UNREMARKABLE    RBC Morphology MORPHOLOGY UNREMARKABLE    Smear Review MORPHOLOGY UNREMARKABLE    Immature Granulocytes 4 %   Abs Immature Granulocytes 0.81 (H) 0.00 - 0.07 K/uL    Comment: Performed at [facility], [address] [place], [place] [address]  Comprehensive metabolic panel     Status: Abnormal   Collection Time: 04/19/19 10:06 PM  Result Value Ref Range   Sodium 121 (L) 135 - 145 mmol/L   Potassium 3.6 3.5 - 5.1 mmol/L   Chloride 86 (L) 98 - 111 mmol/L   CO2 16 (L) 22 - 32 mmol/L   Glucose, Bld 93 70 - 99 mg/dL   BUN 99 (H) 8 - 23 mg/dL   Creatinine, Ser 10.37 (H) 0.44 - 1.00 mg/dL   Calcium 7.0 (L) 8.9 - 10.3 mg/dL   Total Protein 4.8 (L) 6.5 - 8.1 g/dL   Albumin 2.5 (L) 3.5 - 5.0 g/dL   AST 29 15 - 41 U/L   ALT 28 0 - 44 U/L   Alkaline Phosphatase 127 (H) 38 - 126 U/L   Total Bilirubin  0.6 0.3 - 1.2 mg/dL   GFR calc non Af Amer 3 (L) >60 mL/min   GFR calc Af Amer 4 (L) >60 mL/min   Anion gap 19 (H) 5 - 15    Comment: Performed at [facility], [address] [place], [place] [address]  Lipase, blood     Status: Abnormal   Collection Time: 04/19/19 10:06 PM  Result Value Ref Range   Lipase 56 (H) 11 - 51 U/L    Comment: Performed at [facility], [address] [place], [place] [address]  Protime-INR     Status: None   Collection Time: 04/19/19 10:06 PM  Result Value Ref Range   Prothrombin Time 14.3 11.4 - 15.2 seconds   INR 1.1 0.8 - 1.2    Comment: (NOTE) INR goal varies based on device and disease states. Performed at [facility], [address] [place], [place] [address]   APTT     Status: None   Collection Time: 04/19/19 10:06 PM  Result Value Ref Range   aPTT 34 24 - 36 seconds    Comment: Performed at [facility], [address] [place], [place] [address]  Lactic acid, plasma     Status: None   Collection Time: 04/19/19 10:16 PM  Result Value Ref Range   Lactic Acid, Venous 1.6 0.5 - 1.9 mmol/L    Comment: Performed at [facility], [address] [place], [place] [address]  SARS Coronavirus 2 [facility] order, Performed in [place] hospital lab)     Status: None   Collection Time: 04/19/19 10:16 PM  Result Value Ref Range   SARS Coronavirus 2 NEGATIVE NEGATIVE    Comment: (NOTE) If result is NEGATIVE SARS-CoV-2 target nucleic acids are NOT DETECTED. The SARS-CoV-2 RNA is generally detectable in upper and lower  respiratory specimens during the acute phase of infection. The lowest  concentration of SARS-CoV-2 viral copies this assay can detect is 250  copies / mL. A negative result does not preclude SARS-CoV-2 infection  and should not be used as the sole basis for treatment or other  patient management decisions.  A negative result may occur with  improper specimen collection / handling,  submission of specimen other  than nasopharyngeal swab, presence of viral mutation(s) within the  areas targeted by this assay, and inadequate number of viral copies  (<250 copies / mL). A negative result must be combined with clinical  observations, patient history, and epidemiological information. If result is POSITIVE SARS-CoV-2 target nucleic acids are DETECTED. The SARS-CoV-2 RNA is generally detectable in upper and lower  respiratory specimens dur ing the acute phase of infection.  Positive  results are indicative of active infection with SARS-CoV-2.  Clinical  correlation with patient history and other diagnostic information is  necessary to determine patient infection status.  Positive results do  not rule out bacterial infection or co-infection with other viruses. If result is PRESUMPTIVE POSTIVE SARS-CoV-2 nucleic acids MAY BE PRESENT.   A presumptive positive result was obtained on the submitted specimen  and confirmed on repeat testing.  While 2019 novel coronavirus  (SARS-CoV-2) nucleic acids may be present in the submitted sample  additional confirmatory testing may be necessary for epidemiological  and / or clinical management purposes  to differentiate between  SARS-CoV-2 and other Sarbecovirus currently known to infect humans.  If clinically indicated additional testing with an alternate test  methodology [phone]) is advised. The SARS-CoV-2 RNA is generally  detectable in upper and lower respiratory sp ecimens during the acute  phase of infection. The expected result is Negative. Fact Sheet for Patients:  StrictlyIdeas.no Fact Sheet for Healthcare Providers: BankingDealers.co.za This test is not yet approved or cleared by the [place] FDA and has been  authorized for detection and/or diagnosis of SARS-CoV-2 by FDA under an Emergency Use Authorization (EUA).  This EUA will remain in effect (meaning this test can be  used) for the duration of the COVID-19 declaration under Section 564(b)(1) of the Act, 21 U.S.C. section 360bbb-3(b)(1), unless the authorization is terminated or revoked sooner. Performed at [facility], [address] [place], [place] [address]   Blood Culture (routine x 2)     Status: None (Preliminary result)   Collection Time: 04/19/19 11:52 PM   Specimen: BLOOD  Result Value Ref Range   Specimen Description BLOOD LEFT ANTECUBITAL    Special Requests      BOTTLES DRAWN AEROBIC AND ANAEROBIC Blood Culture adequate volume   Culture      NO GROWTH < 12 HOURS Performed at [facility], [address] [place], [place] [address]    Report Status PENDING   Blood Culture (routine x 2)     Status: None (Preliminary result)   Collection Time: 04/19/19 11:52 PM   Specimen: BLOOD  Result Value Ref Range   Specimen Description BLOOD BLOOD LEFT FOREARM    Special Requests      BOTTLES DRAWN AEROBIC AND ANAEROBIC Blood Culture results may not be optimal due to an inadequate volume of blood received in culture bottles   Culture      NO GROWTH < 12 HOURS Performed at [facility], [address] [place], [place] [address]    Report Status PENDING    Ct Abdomen Pelvis Wo Contrast  Result Date: 04/19/2019 CLINICAL DATA:  Abdominal distension with abdominal pain. EXAM: CT ABDOMEN AND PELVIS WITHOUT CONTRAST TECHNIQUE: Multidetector CT imaging of the abdomen and pelvis was performed following the standard protocol without IV contrast. COMPARISON:  CT dated March 30, 2019 FINDINGS: Lower chest: The lung bases are clear. The heart size is normal. Hepatobiliary: The liver is cirrhotic. There is no discrete hepatic mass, however evaluation is limited by lack of IV contrast. Status post cholecystectomy.There is no biliary ductal dilation. Pancreas: Normal Spleen: The spleen is enlarged. Adrenals/Urinary Tract: --Adrenal glands: No adrenal hemorrhage. --Right  kidney/ureter: Again noted are multiple complex appearing cystic structures involving the right kidney. There is no right-sided hydronephrosis. --Left kidney/ureter: The left kidney demonstrates multiple complex cystic structures including a hyperdense 2.6 cm mass arising from the lower pole. There is no hydronephrosis. There may be nonobstructing stones. --Urinary bladder: Decompressed and therefore poorly evaluated. Stomach/Bowel: --Stomach/Duodenum: There is some wall thickening of the distal esophagus. --Small bowel: There are some mildly dilated loops of small bowel in the upper abdomen. These are suboptimally evaluated in the absence of both IV and oral contrast. There is no evidence for a frank bowel obstruction. There is some mild wall thickening at the level of the hepatic flexure. --Colon: No focal abnormality. --Appendix: The appendix is not reliably identified. Vascular/Lymphatic: Atherosclerotic calcification is present within the non-aneurysmal abdominal aorta, without hemodynamically significant stenosis. --there are mildly prominent retroperitoneal lymph nodes. --No mesenteric lymphadenopathy. --No pelvic or inguinal lymphadenopathy. Reproductive: Status post hysterectomy. No adnexal mass. The previously described 2.7 cm right adnexal mass likely represents the right ovary as opposed to a mass. No further follow-up is required for the finding. Other: There is a small to moderate volume of free fluid in the abdomen, slightly improved from prior study. A peritoneal dialysis catheter is again noted. There is no free air. Musculoskeletal. Patient is status post prior L3-L4 posterior fusion. There are interbody spacers at  the L3-L4 and L4-L5 levels. There is no displaced fracture. IMPRESSION: 1. Evaluation limited by a lack of both oral and IV contrast. 2. Possible mildly dilated loops of small bowel in the left upper quadrant without evidence for a frank small bowel obstruction. Findings may represent an  enteritis in the appropriate clinical setting. This is not well evaluated in the absence of contrast. 3. Wall thickening of the distal esophagus may represent esophagitis in the appropriate clinical setting. 4. Cirrhosis with sequela of portal hypertension including a small volume of abdominal ascites. 5. Unchanged positioning of the peritoneal dialysis catheter. 6. Again noted are complex renal masses bilaterally, not well evaluated on this examination. Follow-up with an outpatient renal ultrasound is recommended for further evaluation. Electronically Signed   By: Constance Holster M.D.   On: 04/19/2019 22:47    Pending Labs Unresulted Labs (From admission, onward)    Start     Ordered   04/19/19 2221  Protein, pleural or peritoneal fluid  Once,   STAT     04/19/19 2220   04/19/19 2221  Lactate dehydrogenase (pleural or peritoneal fluid)  Once,   STAT     04/19/19 2220   04/19/19 2220  Body fluid culture ( includes gram stain)  Once,   STAT    Question:  Are there also cytology or pathology orders on this specimen?  Answer:  No   04/19/19 2220   04/19/19 2220  Body fluid cell count with differential  Once,   STAT    Question:  Are there also cytology or pathology orders on this specimen?  Answer:  No   04/19/19 2220   04/19/19 2220  Glucose, pleural or peritoneal fluid  Once,   STAT     04/19/19 2220   Signed and Held  Creatinine, serum  (heparin)  Once,   R    Comments: Baseline for heparin therapy IF NOT ALREADY DRAWN.    Signed and Held   Signed and Held  TSH  Add-on,   R     Signed and Held          Vitals/Pain Today's Vitals   04/20/19 1250 04/20/19 1300 04/20/19 1340 04/20/19 1415  BP:  (!) 99/55  (!) (P) 89/55  Pulse:  94  (P) 87  Resp:  15  (P) 18  Temp:    (P) 98.5 F (36.9 C)  TempSrc:    (P) Oral  SpO2:  99%  (P) 100%  Weight:      Height:      PainSc: Asleep  Asleep     Isolation Precautions No active isolations  Medications Medications  morphine 2 MG/ML  injection 2 mg (2 mg Intravenous Given 04/20/19 1555)  Chlorhexidine Gluconate Cloth 2 % PADS 6 each (has no administration in time range)  fentaNYL (SUBLIMAZE) injection 50 mcg (50 mcg Intravenous Given 04/19/19 2220)  ondansetron (ZOFRAN) injection 4 mg (4 mg Intravenous Given 04/19/19 2219)  cefTRIAXone (ROCEPHIN) 2 g in sodium chloride 0.9 % 100 mL IVPB (0 g Intravenous Stopped 04/20/19 0049)  fentaNYL (SUBLIMAZE) injection 50 mcg (50 mcg Intravenous Given 04/19/19 2349)  sodium chloride 0.9 % bolus 500 mL (0 mLs Intravenous Stopped 04/20/19 0049)  vancomycin (VANCOCIN) IVPB 1000 mg/200 mL premix (0 mg Intravenous Stopped 04/20/19 0257)  fentaNYL (SUBLIMAZE) injection 50 mcg (50 mcg Intravenous Given 04/20/19 0305)    Mobility walks with person assist High fall risk   Focused Assessments    R Recommendations: See Admitting Provider  Note  Report given to:   Additional Notes:   Pt currently in dialysis. Session should be completed by 17:00.

## 2019-04-20 NOTE — TOC Initial Note (Deleted)
Transition of Care [facility]) - Initial/Assessment Note    Patient Details  Name: [name] MRN: [id number] Date of Birth: 1941/02/15  Transition of Care [facility]) CM/SW Contact:    Katrina Stack, RN Phone Number: 04/20/2019, 11:47 AM  Clinical Narrative:               Presents with abdominal pain.  Daughter [name] has lived with patient since March 2020 due to covid. Brenda's sisters do not provide any support.  Followed by [facility] in [place] for home peritoneal dialysis and Outpatient Palliative. Notified all agencies of admission.  Has a wheelchair and cane. Patient not able to provide much information due to abdominal and hip pain. [name] pays for private caregivers to stay with patient at night when he needs a break.   Expected Discharge Plan: [place]     Patient Goals and CMS Choice Patient states their goals for this hospitalization and ongoing recovery are:: "I don't know" CMS Medicare.gov Compare Post Acute Care list provided to:: (NA) Choice offered to / list presented to : NA  Expected Discharge Plan and Services Expected Discharge Plan: [place]   Discharge Planning Services: CM Consult Post Acute Care Choice: [place] arrangements for the past 2 months: Apartment                           HH Arranged: RN, PT, Nurse's Aide, Social Work [employer] Agency: [place] ([place]), Hospice and [facility] Date [place]: 04/20/19   Representative spoke with at [place]: Craige Cotta with Outpatient palliative and [place] with [facility]  Prior Living Arrangements/Services Living arrangements for the past 2 months: Apartment Lives with:: Self Patient language and need for interpreter reviewed:: Yes Do you feel safe going back to the place where you live?: Yes      Need for Family Participation in Patient Care: Yes (Comment) Care giver support system in place?: Yes (comment) Current  home services: Home PT, Home RN, Homehealth aide, Other (comment)(Outpatient palliative) Criminal Activity/Legal Involvement Pertinent to Current Situation/Hospitalization: No - Comment as needed  Activities of Daily Living      Permission Sought/Granted Permission sought to share information with : Case Manager Permission granted to share information with : Yes, Verbal Permission Granted(daughter [name])              Emotional Assessment Appearance:: Appears stated age Attitude/Demeanor/Rapport: Lethargic Affect (typically observed): Anxious Orientation: : Oriented to Self, Oriented to Place, Oriented to  Time Alcohol / Substance Use: Not Applicable Psych Involvement: No (comment)  Admission diagnosis:  Diarrhea/dehydrated Patient Active Problem List   Diagnosis Date Noted  . Enteritis 04/20/2019  . PAF (paroxysmal atrial fibrillation) ([place]) 04/01/2019  . Abdominal pain 03/30/2019  . Goals of care, counseling/discussion   . Palliative care by specialist   . ESRD (end stage renal disease) on dialysis ([place])   . Other cirrhosis of liver ([place])   . Lung nodule   . Anemia due to chronic kidney disease, on chronic dialysis ([place])   . Hyperkalemia 03/17/2019  . Abnormal thyroid blood test 11/07/2018  . Uncontrolled type 2 diabetes mellitus with hyperglycemia ([place]) 07/25/2018  . Hyperthyroidism 07/05/2018  . Melena 10/08/2013  . Cardiomyopathy ([place]) 07/12/2013  . Physical deconditioning 07/06/2013  . NSTEMI (non-ST elevated myocardial infarction) ([place]) 07/02/2013  . Community acquired pneumonia 07/02/2013  . Sepsis(995.91) 07/02/2013  . Acute respiratory  failure ([place]) 07/02/2013  . Acute systolic CHF (congestive heart failure) ([place]) 07/02/2013  . 10 year risk of MI or stroke 7.5% or greater   . HEMOCCULT POSITIVE STOOL 05/15/2009  . ANEMIA, IRON DEFICIENCY, HX OF 05/15/2009  . DM 05/14/2009  . HYPERCHOLESTEROLEMIA 05/14/2009  . MIGRAINE HEADACHE 05/14/2009  . HYPERTENSION  05/14/2009  . GERD 05/14/2009  . CHOLELITHIASIS 05/14/2009  . NEPHROLITHIASIS 05/14/2009  . BURSITIS 05/14/2009   PCP:  Caryl Bis, MD Pharmacy:   [facility], [address] [place] [address] Phone: [phone] Fax: [phone]     Social Determinants of Health (SDOH) Interventions    Readmission Risk Interventions Readmission Risk Prevention Plan 04/04/2019 03/18/2019  Transportation Screening Complete Complete  Medication Review Press [employer]) Complete -  [place] or Home Care Consult Complete -  SW Recovery Care/Counseling Consult Complete -  Palliative Care Screening Not Applicable -  [place] Not Applicable -  Some recent data might be hidden

## 2019-04-20 NOTE — TOC Progression Note (Deleted)
Transition of Care [facility]) - Progression Note    Patient Details  Name: [name] MRN: [id number] Date of Birth: 05-Aug-1941  [facility]) CM/SW Contact  Katrina Stack, RN Phone Number: 04/20/2019, 11:42 AM  Clinical Narrative:       Expected Discharge Plan: [place]    Expected Discharge Plan and Services Expected Discharge Plan: [place]   Discharge Planning Services: CM Consult Post Acute Care Choice: [place] arrangements for the past 2 months: Apartment                           HH Arranged: RN, PT, Nurse's Aide, Social Work [employer] Agency: [place] ([place]), Hospice and [place] Date [place]: 04/20/19   Representative spoke with at [place]: Craige Cotta with Outpatient palliative and [place] with [facility]   Social Determinants of Health (SDOH) Interventions    Readmission Risk Interventions Readmission Risk Prevention Plan 04/04/2019 03/18/2019  Transportation Screening Complete Complete  Medication Review Press photographer) Complete -  [place] or Home Care Consult Complete -  SW Recovery Care/Counseling Consult Complete -  Palliative Care Screening Not Applicable -  [place] Not Applicable -  Some recent data might be hidden

## 2019-04-20 NOTE — Progress Notes (Signed)
[facility]  ROUNDING NOTE   Subjective:   [name] admitted to [facility] on 04/19/2019 for Diarrhea/dehydrated  Patient did not get peritoneal dialysis last two nights because she is not feeling well.   Unable to give much of a history.   Objective:  Vital signs in last 24 hours:  Temp:  [99.1 F (37.3 C)] 99.1 F (37.3 C) (08/20 2154) Pulse Rate:  [84-98] 97 (08/21 1136) Resp:  [11-22] 17 (08/21 1136) BP: (84-109)/(45-84) 109/84 (08/21 1136) SpO2:  [95 %-100 %] 98 % (08/21 1136) Weight:  [42.2 kg] 42.2 kg (08/20 2156)  Weight change:  Filed Weights   04/19/19 2156  Weight: 42.2 kg    Intake/Output: No intake/output data recorded.   Intake/Output this shift:  No intake/output data recorded.  Physical Exam: General: NAD, laying on stretcher  Head: Normocephalic, atraumatic. Moist oral mucosal membranes  Eyes: Anicteric, PERRL  Neck: Supple, trachea midline  Lungs:  Clear to auscultation  Heart: Regular rate and rhythm  Abdomen:  Tender to palpation  Extremities: no peripheral edema.  Neurologic: Nonfocal, moving all four extremities  Skin: No lesions  Access: PD catheter, RIJ permcath    Basic Metabolic Panel: Recent Labs  Lab 04/19/19 2206  NA 121*  K 3.6  CL 86*  CO2 16*  GLUCOSE 93  BUN 99*  CREATININE 10.37*  CALCIUM 7.0*    Liver Function Tests: Recent Labs  Lab 04/19/19 2206  AST 29  ALT 28  ALKPHOS 127*  BILITOT 0.6  PROT 4.8*  ALBUMIN 2.5*   Recent Labs  Lab 04/19/19 2206  LIPASE 56*   No results for input(s): AMMONIA in the last 168 hours.  CBC: Recent Labs  Lab 04/19/19 2206  WBC 19.8*  NEUTROABS 12.2*  HGB 7.2*  HCT 21.7*  MCV 86.8  PLT 147*    Cardiac Enzymes: No results for input(s): CKTOTAL, CKMB, CKMBINDEX, TROPONINI in the last 168 hours.  BNP: Invalid input(s): POCBNP  CBG: No results for input(s): GLUCAP in the last 168 hours.  Microbiology: Results for orders placed or  performed during the hospital encounter of 04/19/19  SARS Coronavirus 2 [facility] order, Performed in [place] hospital lab)     Status: None   Collection Time: 04/19/19 10:16 PM  Result Value Ref Range Status   SARS Coronavirus 2 NEGATIVE NEGATIVE Final    Comment: (NOTE) If result is NEGATIVE SARS-CoV-2 target nucleic acids are NOT DETECTED. The SARS-CoV-2 RNA is generally detectable in upper and lower  respiratory specimens during the acute phase of infection. The lowest  concentration of SARS-CoV-2 viral copies this assay can detect is 250  copies / mL. A negative result does not preclude SARS-CoV-2 infection  and should not be used as the sole basis for treatment or other  patient management decisions.  A negative result may occur with  improper specimen collection / handling, submission of specimen other  than nasopharyngeal swab, presence of viral mutation(s) within the  areas targeted by this assay, and inadequate number of viral copies  (<250 copies / mL). A negative result must be combined with clinical  observations, patient history, and epidemiological information. If result is POSITIVE SARS-CoV-2 target nucleic acids are DETECTED. The SARS-CoV-2 RNA is generally detectable in upper and lower  respiratory specimens dur ing the acute phase of infection.  Positive  results are indicative of active infection with SARS-CoV-2.  Clinical  correlation with patient history and other diagnostic information is  necessary to  determine patient infection status.  Positive results do  not rule out bacterial infection or co-infection with other viruses. If result is PRESUMPTIVE POSTIVE SARS-CoV-2 nucleic acids MAY BE PRESENT.   A presumptive positive result was obtained on the submitted specimen  and confirmed on repeat testing.  While 12-08-17 novel coronavirus  (SARS-CoV-2) nucleic acids may be present in the submitted sample  additional confirmatory testing may be necessary for  epidemiological  and / or clinical management purposes  to differentiate between  SARS-CoV-2 and other Sarbecovirus currently known to infect humans.  If clinically indicated additional testing with an alternate test  methodology [phone]) is advised. The SARS-CoV-2 RNA is generally  detectable in upper and lower respiratory sp ecimens during the acute  phase of infection. The expected result is Negative. Fact Sheet for Patients:  StrictlyIdeas.no Fact Sheet for Healthcare Providers: BankingDealers.co.za This test is not yet approved or cleared by the [place] FDA and has been authorized for detection and/or diagnosis of SARS-CoV-2 by FDA under an Emergency Use Authorization (EUA).  This EUA will remain in effect (meaning this test can be used) for the duration of the COVID-19 declaration under Section 564(b)(1) of the Act, 21 U.S.C. section 360bbb-3(b)(1), unless the authorization is terminated or revoked sooner. Performed at [facility], [address] [place], [place] [address]   Blood Culture (routine x 2)     Status: None (Preliminary result)   Collection Time: 04/19/19 11:52 PM   Specimen: BLOOD  Result Value Ref Range Status   Specimen Description BLOOD LEFT ANTECUBITAL  Final   Special Requests   Final    BOTTLES DRAWN AEROBIC AND ANAEROBIC Blood Culture adequate volume   Culture   Final    NO GROWTH < 12 HOURS Performed at [facility], [address], [place], [place] [address]    Report Status PENDING  Incomplete  Blood Culture (routine x 2)     Status: None (Preliminary result)   Collection Time: 04/19/19 11:52 PM   Specimen: BLOOD  Result Value Ref Range Status   Specimen Description BLOOD BLOOD LEFT FOREARM  Final   Special Requests   Final    BOTTLES DRAWN AEROBIC AND ANAEROBIC Blood Culture results may not be optimal due to an inadequate volume of blood received in culture bottles    Culture   Final    NO GROWTH < 12 HOURS Performed at [facility], [address], [place], [place] [address]    Report Status PENDING  Incomplete    Coagulation Studies: Recent Labs    04/19/19 08-Dec-2204  LABPROT 14.3  INR 1.1    Urinalysis: No results for input(s): COLORURINE, LABSPEC, PHURINE, GLUCOSEU, HGBUR, BILIRUBINUR, KETONESUR, PROTEINUR, UROBILINOGEN, NITRITE, LEUKOCYTESUR in the last 72 hours.  Invalid input(s): APPERANCEUR    Imaging: Ct Abdomen Pelvis Wo Contrast  Result Date: 04/19/2019 CLINICAL DATA:  Abdominal distension with abdominal pain. EXAM: CT ABDOMEN AND PELVIS WITHOUT CONTRAST TECHNIQUE: Multidetector CT imaging of the abdomen and pelvis was performed following the standard protocol without IV contrast. COMPARISON:  CT dated March 30, 2019 FINDINGS: Lower chest: The lung bases are clear. The heart size is normal. Hepatobiliary: The liver is cirrhotic. There is no discrete hepatic mass, however evaluation is limited by lack of IV contrast. Status post cholecystectomy.There is no biliary ductal dilation. Pancreas: Normal Spleen: The spleen is enlarged. Adrenals/Urinary Tract: --Adrenal glands: No adrenal hemorrhage. --Right kidney/ureter: Again noted are multiple complex appearing cystic structures involving the right kidney.  There is no right-sided hydronephrosis. --Left kidney/ureter: The left kidney demonstrates multiple complex cystic structures including a hyperdense 2.6 cm mass arising from the lower pole. There is no hydronephrosis. There may be nonobstructing stones. --Urinary bladder: Decompressed and therefore poorly evaluated. Stomach/Bowel: --Stomach/Duodenum: There is some wall thickening of the distal esophagus. --Small bowel: There are some mildly dilated loops of small bowel in the upper abdomen. These are suboptimally evaluated in the absence of both IV and oral contrast. There is no evidence for a frank bowel obstruction. There is some mild wall  thickening at the level of the hepatic flexure. --Colon: No focal abnormality. --Appendix: The appendix is not reliably identified. Vascular/Lymphatic: Atherosclerotic calcification is present within the non-aneurysmal abdominal aorta, without hemodynamically significant stenosis. --there are mildly prominent retroperitoneal lymph nodes. --No mesenteric lymphadenopathy. --No pelvic or inguinal lymphadenopathy. Reproductive: Status post hysterectomy. No adnexal mass. The previously described 2.7 cm right adnexal mass likely represents the right ovary as opposed to a mass. No further follow-up is required for the finding. Other: There is a small to moderate volume of free fluid in the abdomen, slightly improved from prior study. A peritoneal dialysis catheter is again noted. There is no free air. Musculoskeletal. Patient is status post prior L3-L4 posterior fusion. There are interbody spacers at the L3-L4 and L4-L5 levels. There is no displaced fracture. IMPRESSION: 1. Evaluation limited by a lack of both oral and IV contrast. 2. Possible mildly dilated loops of small bowel in the left upper quadrant without evidence for a frank small bowel obstruction. Findings may represent an enteritis in the appropriate clinical setting. This is not well evaluated in the absence of contrast. 3. Wall thickening of the distal esophagus may represent esophagitis in the appropriate clinical setting. 4. Cirrhosis with sequela of portal hypertension including a small volume of abdominal ascites. 5. Unchanged positioning of the peritoneal dialysis catheter. 6. Again noted are complex renal masses bilaterally, not well evaluated on this examination. Follow-up with an outpatient renal ultrasound is recommended for further evaluation. Electronically Signed   By: Constance Holster M.D.   On: 04/19/2019 22:47     Medications:     morphine injection  Assessment/ Plan:  [name] is a 78 y.o. 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   1. End-stage renal disease with uremia 2. Hyponatremia 3. Metabolic acidosis 4. Anemia of chronic kidney disease  5. Secondary Hyperparathyroidism with Hypocalcemia and hyperphosphatemia. Currently on calcium acetate 6. Hypotension: on midodrine 10mg  tid 7. Hypokalemia  Plan Will require back up hemodialysis. Orders prepared. Discussed case with Daughter.    LOS: 0 Belma Dyches 8/21/202011:49 AM

## 2019-04-21 DIAGNOSIS — K529 Noninfective gastroenteritis and colitis, unspecified: Secondary | ICD-10-CM

## 2019-04-21 LAB — MAGNESIUM: Magnesium: 1.7 mg/dL (ref 1.7–2.4)

## 2019-04-21 LAB — BASIC METABOLIC PANEL
Anion gap: 11 (ref 5–15)
BUN: 43 mg/dL — ABNORMAL HIGH (ref 8–23)
CO2: 23 mmol/L (ref 22–32)
Calcium: 7.1 mg/dL — ABNORMAL LOW (ref 8.9–10.3)
Chloride: 97 mmol/L — ABNORMAL LOW (ref 98–111)
Creatinine, Ser: 5.89 mg/dL — ABNORMAL HIGH (ref 0.44–1.00)
GFR calc Af Amer: 7 mL/min — ABNORMAL LOW (ref >60)
GFR calc non Af Amer: 6 mL/min — ABNORMAL LOW (ref >60)
Glucose, Bld: 183 mg/dL — ABNORMAL HIGH (ref 70–99)
Potassium: 2.8 mmol/L — ABNORMAL LOW (ref 3.5–5.1)
Sodium: 131 mmol/L — ABNORMAL LOW (ref 135–145)

## 2019-04-21 MED ORDER — POTASSIUM CHLORIDE IN NACL 40-0.9 MEQ/L-% IV SOLN
INTRAVENOUS | Status: DC
  Administered 2019-04-21 – 2019-04-23 (×3): 50 mL/h via INTRAVENOUS
  Filled 2019-04-21 (×4): qty 1000

## 2019-04-21 MED ORDER — GENTAMICIN SULFATE 0.1 % EX CREA
1.0000 "application " | TOPICAL_CREAM | Freq: Every day | CUTANEOUS | Status: DC
  Administered 2019-04-21 – 2019-04-22 (×2): 1 via TOPICAL
  Filled 2019-04-21: qty 15

## 2019-04-21 MED ORDER — NEPRO/CARBSTEADY PO LIQD: 237.0000 mL | Freq: Two times a day (BID) | ORAL | Status: DC

## 2019-04-21 MED ORDER — SODIUM CHLORIDE 0.9 % IV SOLN
INTRAVENOUS | Status: DC
  Administered 2019-04-21: 11:00:00 via INTRAVENOUS

## 2019-04-21 MED ORDER — DELFLEX-LC/1.5% DEXTROSE 344 MOSM/L IP SOLN
INTRAPERITONEAL | Status: DC
  Administered 2019-04-21 – 2019-04-22 (×2): via INTRAPERITONEAL
  Filled 2019-04-21: qty 3000

## 2019-04-21 MED ORDER — TRAZODONE HCL 50 MG PO TABS
50.0000 mg | ORAL_TABLET | Freq: Every evening | ORAL | Status: DC | PRN
  Administered 2019-04-21: 23:00:00 50 mg via ORAL
  Filled 2019-04-21: qty 1

## 2019-04-21 MED ORDER — HEPARIN 1000 UNIT/ML FOR PERITONEAL DIALYSIS
500.0000 [IU] | INTRAMUSCULAR | Status: DC | PRN
  Filled 2019-04-21: qty 0.5

## 2019-04-21 MED ORDER — PRO-STAT SUGAR FREE PO LIQD
30.0000 mL | Freq: Three times a day (TID) | ORAL | Status: DC
  Administered 2019-04-21 – 2019-04-23 (×7): 30 mL via ORAL

## 2019-04-21 NOTE — Progress Notes (Signed)
Marie Lame, MD [facility]   [address], [place], [place] [address] Phone: [phone] Fax : [phone]   Subjective: The patient continues to have diarrhea and reports that she has abdominal pain.  She does state that the symptoms are much better today than they were yesterday.  She is definitely reporting that she is not back to baseline.   Objective: Vital signs in last 24 hours: Vitals:   04/21/19 0407 04/21/19 0408 04/21/19 0500 04/21/19 1030  BP: (!) 76/48 (!) 85/47  (!) 92/59  Pulse: 80 78  89  Resp: 16   19  Temp: 98.6 F (37 C)   98.4 F (36.9 C)  TempSrc: Oral     SpO2: 99%   100%  Weight:   46 kg   Height:       Weight change: 3.816 kg  Intake/Output Summary (Last 24 hours) at 04/21/2019 1205 Last data filed at 04/21/2019 0300 Gross per 24 hour  Intake 200 ml  Output 0 ml  Net 200 ml     Exam: Heart:: Regular rate and rhythm, S1S2 present or without murmur or extra heart sounds Lungs: normal and clear to auscultation and percussion Abdomen: soft, nontender, normal bowel sounds   Lab Results: @LABTEST2 @ Micro Results: Recent Results (from the past 240 hour(s))  Body fluid culture ( includes gram stain)     Status: None (Preliminary result)   Collection Time: 04/19/19  5:20 PM   Specimen: Peritoneal Cavity; Peritoneal Fluid  Result Value Ref Range Status   Specimen Description   Final    PERITONEAL CAVITY Performed at [facility], [address] [place], [place] [address]    Special Requests   Final    NONE Performed at [facility], [address] [place], [place] [address]    Gram Stain PENDING  Incomplete   Culture   Final    NO GROWTH < 12 HOURS Performed at [facility] [address], [place],  [address]    Report Status PENDING  Incomplete  SARS Coronavirus 2 [facility] order, Performed in [place] hospital lab)     Status: None   Collection Time: 04/19/19 10:16 PM  Result Value Ref Range  Status   SARS Coronavirus 2 NEGATIVE NEGATIVE Final    Comment: (NOTE) If result is NEGATIVE SARS-CoV-2 target nucleic acids are NOT DETECTED. The SARS-CoV-2 RNA is generally detectable in upper and lower  respiratory specimens during the acute phase of infection. The lowest  concentration of SARS-CoV-2 viral copies this assay can detect is 250  copies / mL. A negative result does not preclude SARS-CoV-2 infection  and should not be used as the sole basis for treatment or other  patient management decisions.  A negative result may occur with  improper specimen collection / handling, submission of specimen other  than nasopharyngeal swab, presence of viral mutation(s) within the  areas targeted by this assay, and inadequate number of viral copies  (<250 copies / mL). A negative result must be combined with clinical  observations, patient history, and epidemiological information. If result is POSITIVE SARS-CoV-2 target nucleic acids are DETECTED. The SARS-CoV-2 RNA is generally detectable in upper and lower  respiratory specimens dur ing the acute phase of infection.  Positive  results are indicative of active infection with SARS-CoV-2.  Clinical  correlation with patient history and other diagnostic information is  necessary to determine patient infection status.  Positive results do  not rule out bacterial infection  or co-infection with other viruses. If result is PRESUMPTIVE POSTIVE SARS-CoV-2 nucleic acids MAY BE PRESENT.   A presumptive positive result was obtained on the submitted specimen  and confirmed on repeat testing.  While 2019 novel coronavirus  (SARS-CoV-2) nucleic acids may be present in the submitted sample  additional confirmatory testing may be necessary for epidemiological  and / or clinical management purposes  to differentiate between  SARS-CoV-2 and other Sarbecovirus currently known to infect humans.  If clinically indicated additional testing with an alternate  test  methodology [phone]) is advised. The SARS-CoV-2 RNA is generally  detectable in upper and lower respiratory sp ecimens during the acute  phase of infection. The expected result is Negative. Fact Sheet for Patients:  StrictlyIdeas.no Fact Sheet for Healthcare Providers: BankingDealers.co.za This test is not yet approved or cleared by the [place] FDA and has been authorized for detection and/or diagnosis of SARS-CoV-2 by FDA under an Emergency Use Authorization (EUA).  This EUA will remain in effect (meaning this test can be used) for the duration of the COVID-19 declaration under Section 564(b)(1) of the Act, 21 U.S.C. section 360bbb-3(b)(1), unless the authorization is terminated or revoked sooner. Performed at [facility], [address] [place], [place] [address]   Blood Culture (routine x 2)     Status: None (Preliminary result)   Collection Time: 04/19/19 11:52 PM   Specimen: BLOOD  Result Value Ref Range Status   Specimen Description BLOOD LEFT ANTECUBITAL  Final   Special Requests   Final    BOTTLES DRAWN AEROBIC AND ANAEROBIC Blood Culture adequate volume   Culture   Final    NO GROWTH < 12 HOURS Performed at [facility], [address] [place], [place] [address]    Report Status PENDING  Incomplete  Blood Culture (routine x 2)     Status: None (Preliminary result)   Collection Time: 04/19/19 11:52 PM   Specimen: BLOOD  Result Value Ref Range Status   Specimen Description BLOOD BLOOD LEFT FOREARM  Final   Special Requests   Final    BOTTLES DRAWN AEROBIC AND ANAEROBIC Blood Culture results may not be optimal due to an inadequate volume of blood received in culture bottles   Culture   Final    NO GROWTH < 12 HOURS Performed at [facility], [address] [place], [place] [address]    Report Status PENDING  Incomplete   Studies/Results: Ct Abdomen Pelvis Wo  Contrast  Result Date: 04/19/2019 CLINICAL DATA:  Abdominal distension with abdominal pain. EXAM: CT ABDOMEN AND PELVIS WITHOUT CONTRAST TECHNIQUE: Multidetector CT imaging of the abdomen and pelvis was performed following the standard protocol without IV contrast. COMPARISON:  CT dated March 30, 2019 FINDINGS: Lower chest: The lung bases are clear. The heart size is normal. Hepatobiliary: The liver is cirrhotic. There is no discrete hepatic mass, however evaluation is limited by lack of IV contrast. Status post cholecystectomy.There is no biliary ductal dilation. Pancreas: Normal Spleen: The spleen is enlarged. Adrenals/Urinary Tract: --Adrenal glands: No adrenal hemorrhage. --Right kidney/ureter: Again noted are multiple complex appearing cystic structures involving the right kidney. There is no right-sided hydronephrosis. --Left kidney/ureter: The left kidney demonstrates multiple complex cystic structures including a hyperdense 2.6 cm mass arising from the lower pole. There is no hydronephrosis. There may be nonobstructing stones. --Urinary bladder: Decompressed and therefore poorly evaluated. Stomach/Bowel: --Stomach/Duodenum: There is some wall thickening of the distal esophagus. --Small bowel: There are some mildly dilated loops  of small bowel in the upper abdomen. These are suboptimally evaluated in the absence of both IV and oral contrast. There is no evidence for a frank bowel obstruction. There is some mild wall thickening at the level of the hepatic flexure. --Colon: No focal abnormality. --Appendix: The appendix is not reliably identified. Vascular/Lymphatic: Atherosclerotic calcification is present within the non-aneurysmal abdominal aorta, without hemodynamically significant stenosis. --there are mildly prominent retroperitoneal lymph nodes. --No mesenteric lymphadenopathy. --No pelvic or inguinal lymphadenopathy. Reproductive: Status post hysterectomy. No adnexal mass. The previously described 2.7  cm right adnexal mass likely represents the right ovary as opposed to a mass. No further follow-up is required for the finding. Other: There is a small to moderate volume of free fluid in the abdomen, slightly improved from prior study. A peritoneal dialysis catheter is again noted. There is no free air. Musculoskeletal. Patient is status post prior L3-L4 posterior fusion. There are interbody spacers at the L3-L4 and L4-L5 levels. There is no displaced fracture. IMPRESSION: 1. Evaluation limited by a lack of both oral and IV contrast. 2. Possible mildly dilated loops of small bowel in the left upper quadrant without evidence for a frank small bowel obstruction. Findings may represent an enteritis in the appropriate clinical setting. This is not well evaluated in the absence of contrast. 3. Wall thickening of the distal esophagus may represent esophagitis in the appropriate clinical setting. 4. Cirrhosis with sequela of portal hypertension including a small volume of abdominal ascites. 5. Unchanged positioning of the peritoneal dialysis catheter. 6. Again noted are complex renal masses bilaterally, not well evaluated on this examination. Follow-up with an outpatient renal ultrasound is recommended for further evaluation. Electronically Signed   By: Constance Holster M.D.   On: 04/19/2019 22:47   Medications: I have reviewed the patient's current medications. Scheduled Meds:  Chlorhexidine Gluconate Cloth  6 each Topical Q0600   docusate sodium  100 mg Oral BID   feeding supplement (NEPRO CARB STEADY)  237 mL Oral BID BM   feeding supplement (PRO-STAT SUGAR FREE 64)  30 mL Oral TID WC   heparin  5,000 Units Subcutaneous Q8H   midodrine  10 mg Oral TID PC   pantoprazole (PROTONIX) IV  40 mg Intravenous Q24H   Continuous Infusions:  0.9 % NaCl with KCl 40 mEq / L     cefTRIAXone (ROCEPHIN)  IV 1 g (04/20/19 2218)   metronidazole 500 mg (04/21/19 1046)   PRN Meds:.acetaminophen **OR**  acetaminophen, HYDROcodone-acetaminophen, morphine injection, ondansetron **OR** ondansetron (ZOFRAN) IV   Assessment: Active Problems:   Enteritis    Plan: The patient reports that she is better today than she was yesterday but not back to her baseline.  The patient was found to have enteritis and a conservative approach has been chosen with this patient.  Agree with conservative management at this time.  Patient has been explained the plan and agrees with it.   LOS: 1 day   [name] 04/21/2019, 12:05 PM

## 2019-04-21 NOTE — Progress Notes (Signed)
[facility] [place] NAME: [name]    MR#:  [id number]  DATE OF BIRTH:  1941-06-05  SUBJECTIVE:  CHIEF COMPLAINT:   Chief Complaint  Patient presents with  . Diarrhea  . Abdominal Pain   In with abdominal pain and enteritis.  Started on antibiotics.  She is more alert and oriented today and says that she is feeling slightly better than yesterday.  REVIEW OF SYSTEMS:  CONSTITUTIONAL: No fever, she have fatigue or weakness.  EYES: No blurred or double vision.  EARS, NOSE, AND THROAT: No tinnitus or ear pain.  RESPIRATORY: No cough, shortness of breath, wheezing or hemoptysis.  CARDIOVASCULAR: No chest pain, orthopnea, edema.  GASTROINTESTINAL: No nausea, vomiting, diarrhea, she have abdominal pain.  GENITOURINARY: No dysuria, hematuria.  ENDOCRINE: No polyuria, nocturia,  HEMATOLOGY: No anemia, easy bruising or bleeding SKIN: No rash or lesion. MUSCULOSKELETAL: No joint pain or arthritis.   NEUROLOGIC: No tingling, numbness, weakness.  PSYCHIATRY: No anxiety or depression.   ROS  DRUG ALLERGIES:   Allergies  Allergen Reactions  . Codeine Other (See Comments)    Reaction unknown     VITALS:  Blood pressure (!) 92/59, pulse 89, temperature 98.4 F (36.9 C), resp. rate 19, height 5\' 2"  (1.575 m), weight 46 kg, SpO2 100 %.  PHYSICAL EXAMINATION:  GENERAL:  78 y.o.-year-old patient appears malnourished, lying in the bed with some acute distress.  EYES: Pupils equal, round, reactive to light and accommodation. No scleral icterus. Extraocular muscles intact.  HEENT: Head atraumatic, normocephalic. Oropharynx and nasopharynx clear.  NECK:  Supple, no jugular venous distention. No thyroid enlargement, no tenderness.  LUNGS: Normal breath sounds bilaterally, no wheezing, rales,rhonchi or crepitation. No use of accessory muscles of respiration.  Permacath in place in the right upper chest. CARDIOVASCULAR: S1, S2 normal. No  murmurs, rubs, or gallops.  ABDOMEN: Soft, generalized tender, nondistended. Bowel sounds present. No organomegaly or mass.  Peritoneal dialysis catheter in place EXTREMITIES: No pedal edema, cyanosis, or clubbing.  NEUROLOGIC: Cranial nerves II through XII are intact. Muscle strength 3-4/5 in all extremities. Sensation intact. Gait not checked.  PSYCHIATRIC: The patient is alert and oriented x 3.  SKIN: No obvious rash, lesion, or ulcer.   Physical Exam LABORATORY PANEL:   CBC Recent Labs  Lab 04/19/19 2206  WBC 19.8*  HGB 7.2*  HCT 21.7*  PLT 147*   ------------------------------------------------------------------------------------------------------------------  Chemistries  Recent Labs  Lab 04/19/19 2206  04/21/19 0928  NA 121*  --  131*  K 3.6  --  2.8*  CL 86*  --  97*  CO2 16*  --  23  GLUCOSE 93  --  183*  BUN 99*  --  43*  CREATININE 10.37*   < > 5.89*  CALCIUM 7.0*  --  7.1*  MG  --   --  1.7  AST 29  --   --   ALT 28  --   --   ALKPHOS 127*  --   --   BILITOT 0.6  --   --    < > = values in this interval not displayed.   ------------------------------------------------------------------------------------------------------------------  Cardiac Enzymes No results for input(s): TROPONINI in the last 168 hours. ------------------------------------------------------------------------------------------------------------------  RADIOLOGY:  Ct Abdomen Pelvis Wo Contrast  Result Date: 04/19/2019 CLINICAL DATA:  Abdominal distension with abdominal pain. EXAM: CT ABDOMEN AND PELVIS WITHOUT CONTRAST TECHNIQUE: Multidetector CT imaging of the abdomen and pelvis was performed following the standard  protocol without IV contrast. COMPARISON:  CT dated March 30, 2019 FINDINGS: Lower chest: The lung bases are clear. The heart size is normal. Hepatobiliary: The liver is cirrhotic. There is no discrete hepatic mass, however evaluation is limited by lack of IV contrast. Status  post cholecystectomy.There is no biliary ductal dilation. Pancreas: Normal Spleen: The spleen is enlarged. Adrenals/Urinary Tract: --Adrenal glands: No adrenal hemorrhage. --Right kidney/ureter: Again noted are multiple complex appearing cystic structures involving the right kidney. There is no right-sided hydronephrosis. --Left kidney/ureter: The left kidney demonstrates multiple complex cystic structures including a hyperdense 2.6 cm mass arising from the lower pole. There is no hydronephrosis. There may be nonobstructing stones. --Urinary bladder: Decompressed and therefore poorly evaluated. Stomach/Bowel: --Stomach/Duodenum: There is some wall thickening of the distal esophagus. --Small bowel: There are some mildly dilated loops of small bowel in the upper abdomen. These are suboptimally evaluated in the absence of both IV and oral contrast. There is no evidence for a frank bowel obstruction. There is some mild wall thickening at the level of the hepatic flexure. --Colon: No focal abnormality. --Appendix: The appendix is not reliably identified. Vascular/Lymphatic: Atherosclerotic calcification is present within the non-aneurysmal abdominal aorta, without hemodynamically significant stenosis. --there are mildly prominent retroperitoneal lymph nodes. --No mesenteric lymphadenopathy. --No pelvic or inguinal lymphadenopathy. Reproductive: Status post hysterectomy. No adnexal mass. The previously described 2.7 cm right adnexal mass likely represents the right ovary as opposed to a mass. No further follow-up is required for the finding. Other: There is a small to moderate volume of free fluid in the abdomen, slightly improved from prior study. A peritoneal dialysis catheter is again noted. There is no free air. Musculoskeletal. Patient is status post prior L3-L4 posterior fusion. There are interbody spacers at the L3-L4 and L4-L5 levels. There is no displaced fracture. IMPRESSION: 1. Evaluation limited by a lack of  both oral and IV contrast. 2. Possible mildly dilated loops of small bowel in the left upper quadrant without evidence for a frank small bowel obstruction. Findings may represent an enteritis in the appropriate clinical setting. This is not well evaluated in the absence of contrast. 3. Wall thickening of the distal esophagus may represent esophagitis in the appropriate clinical setting. 4. Cirrhosis with sequela of portal hypertension including a small volume of abdominal ascites. 5. Unchanged positioning of the peritoneal dialysis catheter. 6. Again noted are complex renal masses bilaterally, not well evaluated on this examination. Follow-up with an outpatient renal ultrasound is recommended for further evaluation. Electronically Signed   By: Constance Holster M.D.   On: 04/19/2019 22:47    ASSESSMENT AND PLAN:   Active Problems:   Enteritis  78 year old female admitted for gastroenteritis. 1.  enteritis:  Continue ceftriaxone and Flagyl. As this is repeated episode I will call GI consult. Suggested to continue treating with medications. 2.  Hypertension: The patient currently has low blood pressure.  Hold antihypertensive medications.   3.  Hyponatremia: Worse than baseline; continue midodrine 4.  DVT prophylaxis: Heparin 5.  GI prophylaxis: None 6.  End-stage renal disease on peritoneal dialysis at home-does not seem to be tolerating well in last few days. Call nephrology consult to help in the hospital. Received hemodialysis yesterday. 7.  Protein calorie malnutrition Patient appears to be severely malnourished.. With having history of cancer, end-stage renal disease on dialysis, colitis and enteritis, malnourished, generalized weakness-she does not seem to have very good prognosis.  If she does not improve much I will involve palliative care. 8.  Altered mental status-metabolic encephalopathy due to uremia and infection Patient missed peritoneal dialysis for last 2 to 3  days. Nephrologist to help. Patient much improved today.  We will get physical therapy to get idea on discharge needs.  All the records are reviewed and case discussed with Care Management/Social Workerr. Management plans discussed with the patient, family and they are in agreement.  CODE STATUS: DNR  TOTAL TIME TAKING CARE OF THIS PATIENT: 35 minutes.   Spoke to patient's daughter on phone today.  POSSIBLE D/C IN 2-3 DAYS, DEPENDING ON CLINICAL CONDITION.   Vaughan Basta M.D on 04/21/2019   Between 7am to 6pm - Pager - [phone]  After 6pm go to [email or web address] - password [email or web address] [facility]  Office  [phone]  CC: Primary care physician; Caryl Bis, MD  Note: This dictation was prepared with Dragon dictation along with smaller phrase technology. Any transcriptional errors that result from this process are unintentional.

## 2019-04-21 NOTE — Progress Notes (Signed)
[facility]  ROUNDING NOTE   Subjective:   Back up hemodialysis treatment yesterday. Tolerated treatment well. No ultrafiltration.   Patient continues to complain of back and abdominal pain. Continues to complain of diarrhea.   PD catheter flushed yesterday and cell count sent.   Objective:  Vital signs in last 24 hours:  Temp:  [97.8 F (36.6 C)-98.7 F (37.1 C)] 98.4 F (36.9 C) (08/22 1030) Pulse Rate:  [78-95] 89 (08/22 1030) Resp:  [13-19] 19 (08/22 1030) BP: (76-99)/(38-59) 92/59 (08/22 1030) SpO2:  [98 %-100 %] 100 % (08/22 1030) Weight:  [46 kg] 46 kg (08/22 0500)  Weight change: 3.816 kg Filed Weights   04/19/19 2156 04/21/19 0500  Weight: 42.2 kg 46 kg    Intake/Output: I/O last 3 completed shifts: In: 200 [IV Piggyback:200] Out: 0    Intake/Output this shift:  No intake/output data recorded.  Physical Exam: General: NAD, laying in bed  Head: Normocephalic, atraumatic. Moist oral mucosal membranes  Eyes: Anicteric, PERRL  Neck: Supple, trachea midline  Lungs:  Clear to auscultation  Heart: Regular rate and rhythm  Abdomen:  Tender to palpation  Extremities: no peripheral edema.  Neurologic: Nonfocal, moving all four extremities  Skin: No lesions  Access: PD catheter, RIJ permcath    Basic Metabolic Panel: Recent Labs  Lab 04/19/19 2206 04/20/19 1932 04/21/19 0928  NA 121*  --  131*  K 3.6  --  2.8*  CL 86*  --  97*  CO2 16*  --  23  GLUCOSE 93  --  183*  BUN 99*  --  43*  CREATININE 10.37* 4.82* 5.89*  CALCIUM 7.0*  --  7.1*  MG  --   --  1.7    Liver Function Tests: Recent Labs  Lab 04/19/19 2206  AST 29  ALT 28  ALKPHOS 127*  BILITOT 0.6  PROT 4.8*  ALBUMIN 2.5*   Recent Labs  Lab 04/19/19 2206  LIPASE 56*   No results for input(s): AMMONIA in the last 168 hours.  CBC: Recent Labs  Lab 04/19/19 2206  WBC 19.8*  NEUTROABS 12.2*  HGB 7.2*  HCT 21.7*  MCV 86.8  PLT 147*    Cardiac Enzymes: No  results for input(s): CKTOTAL, CKMB, CKMBINDEX, TROPONINI in the last 168 hours.  BNP: Invalid input(s): POCBNP  CBG: No results for input(s): GLUCAP in the last 168 hours.  Microbiology: Results for orders placed or performed during the hospital encounter of 04/19/19  Body fluid culture ( includes gram stain)     Status: None (Preliminary result)   Collection Time: 04/19/19  5:20 PM   Specimen: Peritoneal Cavity; Peritoneal Fluid  Result Value Ref Range Status   Specimen Description   Final    PERITONEAL CAVITY Performed at [facility], [address], [place], [place] [address]    Special Requests   Final    NONE Performed at [facility], [address] [place], [place] [address]    Gram Stain PENDING  Incomplete   Culture   Final    NO GROWTH < 12 HOURS Performed at [facility], [place] [address], [place], [place] [address]    Report Status PENDING  Incomplete  SARS Coronavirus 2 [facility] order, Performed in [place] hospital lab)     Status: None   Collection Time: 04/19/19 10:16 PM  Result Value Ref Range Status   SARS Coronavirus 2 NEGATIVE NEGATIVE Final    Comment: (NOTE) If result  is NEGATIVE SARS-CoV-2 target nucleic acids are NOT DETECTED. The SARS-CoV-2 RNA is generally detectable in upper and lower  respiratory specimens during the acute phase of infection. The lowest  concentration of SARS-CoV-2 viral copies this assay can detect is 250  copies / mL. A negative result does not preclude SARS-CoV-2 infection  and should not be used as the sole basis for treatment or other  patient management decisions.  A negative result may occur with  improper specimen collection / handling, submission of specimen other  than nasopharyngeal swab, presence of viral mutation(s) within the  areas targeted by this assay, and inadequate number of viral copies  (<250 copies / mL). A negative result must be combined with clinical  observations,  patient history, and epidemiological information. If result is POSITIVE SARS-CoV-2 target nucleic acids are DETECTED. The SARS-CoV-2 RNA is generally detectable in upper and lower  respiratory specimens dur ing the acute phase of infection.  Positive  results are indicative of active infection with SARS-CoV-2.  Clinical  correlation with patient history and other diagnostic information is  necessary to determine patient infection status.  Positive results do  not rule out bacterial infection or co-infection with other viruses. If result is PRESUMPTIVE POSTIVE SARS-CoV-2 nucleic acids MAY BE PRESENT.   A presumptive positive result was obtained on the submitted specimen  and confirmed on repeat testing.  While 2019 novel coronavirus  (SARS-CoV-2) nucleic acids may be present in the submitted sample  additional confirmatory testing may be necessary for epidemiological  and / or clinical management purposes  to differentiate between  SARS-CoV-2 and other Sarbecovirus currently known to infect humans.  If clinically indicated additional testing with an alternate test  methodology [phone]) is advised. The SARS-CoV-2 RNA is generally  detectable in upper and lower respiratory sp ecimens during the acute  phase of infection. The expected result is Negative. Fact Sheet for Patients:  StrictlyIdeas.no Fact Sheet for Healthcare Providers: BankingDealers.co.za This test is not yet approved or cleared by the [place] FDA and has been authorized for detection and/or diagnosis of SARS-CoV-2 by FDA under an Emergency Use Authorization (EUA).  This EUA will remain in effect (meaning this test can be used) for the duration of the COVID-19 declaration under Section 564(b)(1) of the Act, 21 U.S.C. section 360bbb-3(b)(1), unless the authorization is terminated or revoked sooner. Performed at [facility], [address]  [place], [place] [address]   Blood Culture (routine x 2)     Status: None (Preliminary result)   Collection Time: 04/19/19 11:52 PM   Specimen: BLOOD  Result Value Ref Range Status   Specimen Description BLOOD LEFT ANTECUBITAL  Final   Special Requests   Final    BOTTLES DRAWN AEROBIC AND ANAEROBIC Blood Culture adequate volume   Culture   Final    NO GROWTH < 12 HOURS Performed at [facility], [address], [place], [place] [address]    Report Status PENDING  Incomplete  Blood Culture (routine x 2)     Status: None (Preliminary result)   Collection Time: 04/19/19 11:52 PM   Specimen: BLOOD  Result Value Ref Range Status   Specimen Description BLOOD BLOOD LEFT FOREARM  Final   Special Requests   Final    BOTTLES DRAWN AEROBIC AND ANAEROBIC Blood Culture results may not be optimal due to an inadequate volume of blood received in culture bottles   Culture   Final    NO GROWTH < 12 HOURS Performed  at [facility], [address] [place],  [address]    Report Status PENDING  Incomplete    Coagulation Studies: Recent Labs    04/19/19 2204/12/01  LABPROT 14.3  INR 1.1    Urinalysis: No results for input(s): COLORURINE, LABSPEC, PHURINE, GLUCOSEU, HGBUR, BILIRUBINUR, KETONESUR, PROTEINUR, UROBILINOGEN, NITRITE, LEUKOCYTESUR in the last 72 hours.  Invalid input(s): APPERANCEUR    Imaging: Ct Abdomen Pelvis Wo Contrast  Result Date: 04/19/2019 CLINICAL DATA:  Abdominal distension with abdominal pain. EXAM: CT ABDOMEN AND PELVIS WITHOUT CONTRAST TECHNIQUE: Multidetector CT imaging of the abdomen and pelvis was performed following the standard protocol without IV contrast. COMPARISON:  CT dated March 30, 2019 FINDINGS: Lower chest: The lung bases are clear. The heart size is normal. Hepatobiliary: The liver is cirrhotic. There is no discrete hepatic mass, however evaluation is limited by lack of IV contrast. Status post cholecystectomy.There is no biliary ductal  dilation. Pancreas: Normal Spleen: The spleen is enlarged. Adrenals/Urinary Tract: --Adrenal glands: No adrenal hemorrhage. --Right kidney/ureter: Again noted are multiple complex appearing cystic structures involving the right kidney. There is no right-sided hydronephrosis. --Left kidney/ureter: The left kidney demonstrates multiple complex cystic structures including a hyperdense 2.6 cm mass arising from the lower pole. There is no hydronephrosis. There may be nonobstructing stones. --Urinary bladder: Decompressed and therefore poorly evaluated. Stomach/Bowel: --Stomach/Duodenum: There is some wall thickening of the distal esophagus. --Small bowel: There are some mildly dilated loops of small bowel in the upper abdomen. These are suboptimally evaluated in the absence of both IV and oral contrast. There is no evidence for a frank bowel obstruction. There is some mild wall thickening at the level of the hepatic flexure. --Colon: No focal abnormality. --Appendix: The appendix is not reliably identified. Vascular/Lymphatic: Atherosclerotic calcification is present within the non-aneurysmal abdominal aorta, without hemodynamically significant stenosis. --there are mildly prominent retroperitoneal lymph nodes. --No mesenteric lymphadenopathy. --No pelvic or inguinal lymphadenopathy. Reproductive: Status post hysterectomy. No adnexal mass. The previously described 2.7 cm right adnexal mass likely represents the right ovary as opposed to a mass. No further follow-up is required for the finding. Other: There is a small to moderate volume of free fluid in the abdomen, slightly improved from prior study. A peritoneal dialysis catheter is again noted. There is no free air. Musculoskeletal. Patient is status post prior L3-L4 posterior fusion. There are interbody spacers at the L3-L4 and L4-L5 levels. There is no displaced fracture. IMPRESSION: 1. Evaluation limited by a lack of both oral and IV contrast. 2. Possible mildly  dilated loops of small bowel in the left upper quadrant without evidence for a frank small bowel obstruction. Findings may represent an enteritis in the appropriate clinical setting. This is not well evaluated in the absence of contrast. 3. Wall thickening of the distal esophagus may represent esophagitis in the appropriate clinical setting. 4. Cirrhosis with sequela of portal hypertension including a small volume of abdominal ascites. 5. Unchanged positioning of the peritoneal dialysis catheter. 6. Again noted are complex renal masses bilaterally, not well evaluated on this examination. Follow-up with an outpatient renal ultrasound is recommended for further evaluation. Electronically Signed   By: Constance Holster M.D.   On: 04/19/2019 22:47     Medications:   . sodium chloride 75 mL/hr at 04/21/19 1045  . cefTRIAXone (ROCEPHIN)  IV 1 g (04/20/19 01-Dec-2216)  . metronidazole 500 mg (04/21/19 1046)   . Chlorhexidine Gluconate Cloth  6 each Topical Q0600  . docusate sodium  100 mg  Oral BID  . feeding supplement (NEPRO CARB STEADY)  237 mL Oral BID BM  . feeding supplement (PRO-STAT SUGAR FREE 64)  30 mL Oral TID WC  . heparin  5,000 Units Subcutaneous Q8H  . midodrine  10 mg Oral TID PC  . pantoprazole (PROTONIX) IV  40 mg Intravenous Q24H   acetaminophen **OR** acetaminophen, HYDROcodone-acetaminophen, morphine injection, ondansetron **OR** ondansetron (ZOFRAN) IV  Assessment/ Plan:  [name] is a 77 y.o. 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   1. End-stage renal disease with uremia 2. Hyponatremia 3. Metabolic acidosis 4. Anemia of chronic kidney disease  5. Secondary Hyperparathyroidism with Hypocalcemia and hyperphosphatemia.   6. Hypotension: on midodrine 10mg  tid 7. Hypokalemia  Plan Required back up hemodialysis treatment yesterday.  - IV fluids: change to normal saline with potassium chloride.  - Peritoneal dialysis scheduled for tonight. Orders prepared.    LOS: 1 [name] 8/22/202011:40 AM

## 2019-04-22 LAB — CBC
HCT: 22.3 % — ABNORMAL LOW (ref 36.0–46.0)
Hemoglobin: 6.8 g/dL — ABNORMAL LOW (ref 12.0–15.0)
MCH: 28.1 pg (ref 26.0–34.0)
MCHC: 30.5 g/dL (ref 30.0–36.0)
MCV: 92.1 fL (ref 80.0–100.0)
Platelets: 135 10*3/uL — ABNORMAL LOW (ref 150–400)
RBC: 2.42 MIL/uL — ABNORMAL LOW (ref 3.87–5.11)
RDW: 16.6 % — ABNORMAL HIGH (ref 11.5–15.5)
WBC: 11.2 10*3/uL — ABNORMAL HIGH (ref 4.0–10.5)
nRBC: 0.3 % — ABNORMAL HIGH (ref 0.0–0.2)

## 2019-04-22 LAB — COMPREHENSIVE METABOLIC PANEL
ALT: 19 U/L (ref 0–44)
AST: 18 U/L (ref 15–41)
Albumin: 1.9 g/dL — ABNORMAL LOW (ref 3.5–5.0)
Alkaline Phosphatase: 113 U/L (ref 38–126)
Anion gap: 11 (ref 5–15)
BUN: 50 mg/dL — ABNORMAL HIGH (ref 8–23)
CO2: 21 mmol/L — ABNORMAL LOW (ref 22–32)
Calcium: 6.5 mg/dL — ABNORMAL LOW (ref 8.9–10.3)
Chloride: 97 mmol/L — ABNORMAL LOW (ref 98–111)
Creatinine, Ser: 5.67 mg/dL — ABNORMAL HIGH (ref 0.44–1.00)
GFR calc Af Amer: 8 mL/min — ABNORMAL LOW (ref >60)
GFR calc non Af Amer: 7 mL/min — ABNORMAL LOW (ref >60)
Glucose, Bld: 160 mg/dL — ABNORMAL HIGH (ref 70–99)
Potassium: 3.3 mmol/L — ABNORMAL LOW (ref 3.5–5.1)
Sodium: 129 mmol/L — ABNORMAL LOW (ref 135–145)
Total Bilirubin: 0.6 mg/dL (ref 0.3–1.2)
Total Protein: 4.3 g/dL — ABNORMAL LOW (ref 6.5–8.1)

## 2019-04-22 LAB — HEMOGLOBIN AND HEMATOCRIT, BLOOD
HCT: 27.3 % — ABNORMAL LOW (ref 36.0–46.0)
Hemoglobin: 8.6 g/dL — ABNORMAL LOW (ref 12.0–15.0)

## 2019-04-22 LAB — PREPARE RBC (CROSSMATCH)

## 2019-04-22 MED ORDER — NYSTATIN 100000 UNIT/ML MT SUSP
5.0000 mL | Freq: Four times a day (QID) | OROMUCOSAL | Status: DC
  Administered 2019-04-22 – 2019-04-23 (×6): 500000 [IU] via OROMUCOSAL
  Filled 2019-04-22 (×6): qty 5

## 2019-04-22 MED ORDER — TRAZODONE HCL 50 MG PO TABS
100.0000 mg | ORAL_TABLET | Freq: Every evening | ORAL | Status: DC | PRN
  Administered 2019-04-22: 23:00:00 100 mg via ORAL
  Filled 2019-04-22: qty 2

## 2019-04-22 MED ORDER — SODIUM CHLORIDE 0.9% IV SOLUTION
Freq: Once | INTRAVENOUS | Status: AC
  Administered 2019-04-22: 14:00:00 via INTRAVENOUS

## 2019-04-22 MED ORDER — RENA-VITE PO TABS
1.0000 | ORAL_TABLET | Freq: Every day | ORAL | Status: DC
  Administered 2019-04-23: 09:00:00 1 via ORAL
  Filled 2019-04-22: qty 1

## 2019-04-22 NOTE — Progress Notes (Signed)
PD Start.

## 2019-04-22 NOTE — Progress Notes (Signed)
Initial Nutrition Assessment  DOCUMENTATION CODES:   Not applicable  INTERVENTION:   Prostat liquid protein PO 30 ml TID with meals, each supplement provides 100 kcal, 15 grams protein.  Rena-vite daily   Liberalize diet   NUTRITION DIAGNOSIS:   Increased nutrient needs related to chronic illness(ESRD on PD) as evidenced by increased estimated needs.  GOAL:   Patient will meet greater than or equal to 90% of their needs  MONITOR:   PO intake, Supplement acceptance, Labs, Weight trends, Skin, I & O's  REASON FOR ASSESSMENT:   Malnutrition Screening Tool    ASSESSMENT:   78 y.o. 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  RD working remotely.  Pt is well known to this RD from multiple previous admits; pt with fairly good appetite and oral intake while in hospital. Pt eating 50-100% of meals during her last admit. Pt does not like milky supplements. RD will add non-milky supplements and rena-vite to help pt meet her estimated needs and replace losses from PD. Per chart, pt is fairly weight stable pta.    Medications reviewed and include: colace, heparin, protonix, NaCl w/ KCl @50ml /hr, metronidazole   Labs reviewed: Na 129(L), K 3.3(L), Cl 97(L), BUN 50(H), creat 5.67(H), Ca 6.5(L) adj. 8.18(L), alb 1.9(L) Wbc- 11.2(H), Hgb 6.8(L), Hct 22.3(L)  Unable to complete Nutrition-Focused physical exam at this time.   Diet Order:   Diet Order            Diet renal with fluid restriction Fluid restriction: 1200 mL Fluid; Room service appropriate? Yes; Fluid consistency: Thin  Diet effective now             EDUCATION NEEDS:   Education needs have been addressed  Skin:  Skin Assessment: Reviewed RN  Assessment(ecchymosis)  Last BM:  8/23- Type 6  Height:   Ht Readings from Last 1 Encounters:  04/19/19 5\' 2"  (1.575 m)    Weight:   Wt Readings from Last 1 Encounters:  04/22/19 50.1 kg    Ideal Body Weight:  50 kg  BMI:  Body mass index is 20.2 kg/m.  Estimated Nutritional Needs:   Kcal:  1500-1700kcal/day  Protein:  70-80g/day  Fluid:  UOP +1L  Marie Distance MS, RD, LDN Pager #- [phone] Office#- [phone] After Hours Pager: [phone]

## 2019-04-22 NOTE — Progress Notes (Signed)
Marie Lame, MD [facility]   [address] [place], [place] [address] Phone: [phone] Fax : [phone]   Subjective: The patient reports that she did not sleep well last night due to abdominal pain but reports that her abdominal pain is much better right now.  She also reports that she feels well today and was tolerating her diet.  There is no report of any black stools or bloody stools but she continues to have some diarrhea.   Objective: Vital signs in last 24 hours: Vitals:   04/21/19 1941 04/22/19 0423 04/22/19 0500 04/22/19 1122  BP: (!) 96/53 (!) 84/46  93/65  Pulse: 81 82  93  Resp: 16 15  18   Temp: 97.8 F (36.6 C) 98.4 F (36.9 C)  98.2 F (36.8 C)  TempSrc: Oral Oral    SpO2: 100% 100%  100%  Weight:   50.1 kg   Height:       Weight change: 4.1 kg  Intake/Output Summary (Last 24 hours) at 04/22/2019 1157 Last data filed at 04/22/2019 0900 Gross per 24 hour  Intake 657.76 ml  Output 4095 ml  Net -3437.24 ml     Exam: Heart:: Regular rate and rhythm Lungs: normal and clear to auscultation and percussion Abdomen: soft, nontender, normal bowel sounds   Lab Results: @LABTEST2 @ Micro Results: Recent Results (from the past 240 hour(s))  Body fluid culture ( includes gram stain)     Status: None (Preliminary result)   Collection Time: 04/19/19  5:20 PM   Specimen: Peritoneal Cavity; Peritoneal Fluid  Result Value Ref Range Status   Specimen Description   Final    PERITONEAL CAVITY Performed at [facility], [address], [place], [place] [address]    Special Requests   Final    NONE Performed at [facility], [address] [place], [place] [address]    Gram Stain PENDING  Incomplete   Culture   Final    NO GROWTH 2 DAYS Performed at [facility], [place] [address], [place],  [address]    Report Status PENDING  Incomplete  SARS Coronavirus 2 [facility] order, Performed in [place] hospital lab)     Status:  None   Collection Time: 04/19/19 10:16 PM  Result Value Ref Range Status   SARS Coronavirus 2 NEGATIVE NEGATIVE Final    Comment: (NOTE) If result is NEGATIVE SARS-CoV-2 target nucleic acids are NOT DETECTED. The SARS-CoV-2 RNA is generally detectable in upper and lower  respiratory specimens during the acute phase of infection. The lowest  concentration of SARS-CoV-2 viral copies this assay can detect is 250  copies / mL. A negative result does not preclude SARS-CoV-2 infection  and should not be used as the sole basis for treatment or other  patient management decisions.  A negative result may occur with  improper specimen collection / handling, submission of specimen other  than nasopharyngeal swab, presence of viral mutation(s) within the  areas targeted by this assay, and inadequate number of viral copies  (<250 copies / mL). A negative result must be combined with clinical  observations, patient history, and epidemiological information. If result is POSITIVE SARS-CoV-2 target nucleic acids are DETECTED. The SARS-CoV-2 RNA is generally detectable in upper and lower  respiratory specimens dur ing the acute phase of infection.  Positive  results are indicative of active infection with SARS-CoV-2.  Clinical  correlation with patient history and other diagnostic information is  necessary to determine patient infection  status.  Positive results do  not rule out bacterial infection or co-infection with other viruses. If result is PRESUMPTIVE POSTIVE SARS-CoV-2 nucleic acids MAY BE PRESENT.   A presumptive positive result was obtained on the submitted specimen  and confirmed on repeat testing.  While 2019 novel coronavirus  (SARS-CoV-2) nucleic acids may be present in the submitted sample  additional confirmatory testing may be necessary for epidemiological  and / or clinical management purposes  to differentiate between  SARS-CoV-2 and other Sarbecovirus currently known to infect  humans.  If clinically indicated additional testing with an alternate test  methodology [phone]) is advised. The SARS-CoV-2 RNA is generally  detectable in upper and lower respiratory sp ecimens during the acute  phase of infection. The expected result is Negative. Fact Sheet for Patients:  StrictlyIdeas.no Fact Sheet for Healthcare Providers: BankingDealers.co.za This test is not yet approved or cleared by the [place] FDA and has been authorized for detection and/or diagnosis of SARS-CoV-2 by FDA under an Emergency Use Authorization (EUA).  This EUA will remain in effect (meaning this test can be used) for the duration of the COVID-19 declaration under Section 564(b)(1) of the Act, 21 U.S.C. section 360bbb-3(b)(1), unless the authorization is terminated or revoked sooner. Performed at [facility], [address] [place], [place] [address]   Blood Culture (routine x 2)     Status: None (Preliminary result)   Collection Time: 04/19/19 11:52 PM   Specimen: BLOOD  Result Value Ref Range Status   Specimen Description BLOOD LEFT ANTECUBITAL  Final   Special Requests   Final    BOTTLES DRAWN AEROBIC AND ANAEROBIC Blood Culture adequate volume   Culture   Final    NO GROWTH 2 DAYS Performed at [facility], [address] [place], [place] [address]    Report Status PENDING  Incomplete  Blood Culture (routine x 2)     Status: None (Preliminary result)   Collection Time: 04/19/19 11:52 PM   Specimen: BLOOD  Result Value Ref Range Status   Specimen Description BLOOD BLOOD LEFT FOREARM  Final   Special Requests   Final    BOTTLES DRAWN AEROBIC AND ANAEROBIC Blood Culture results may not be optimal due to an inadequate volume of blood received in culture bottles   Culture   Final    NO GROWTH 2 DAYS Performed at [facility], [address], [place], [place] [address]    Report Status PENDING   Incomplete   Studies/Results: No results found. Medications: I have reviewed the patient's current medications. Scheduled Meds: . sodium chloride   Intravenous Once  . Chlorhexidine Gluconate Cloth  6 each Topical Q0600  . docusate sodium  100 mg Oral BID  . feeding supplement (NEPRO CARB STEADY)  237 mL Oral BID BM  . feeding supplement (PRO-STAT SUGAR FREE 64)  30 mL Oral TID WC  . gentamicin cream  1 application Topical Daily  . heparin  5,000 Units Subcutaneous Q8H  . midodrine  10 mg Oral TID PC  . nystatin  5 mL Mouth/Throat QID  . pantoprazole (PROTONIX) IV  40 mg Intravenous Q24H   Continuous Infusions: . 0.9 % NaCl with KCl 40 mEq / L 50 mL/hr (04/22/19 0327)  . cefTRIAXone (ROCEPHIN)  IV Stopped (04/21/19 1930)  . dialysis solution 1.5% low-MG/low-CA    . metronidazole 500 mg (04/22/19 1124)   PRN Meds:.acetaminophen **OR** acetaminophen, heparin, HYDROcodone-acetaminophen, morphine injection, ondansetron **OR** ondansetron (ZOFRAN) IV, traZODone  Assessment: Active Problems:   Enteritis    Plan: This patient was admitted with enteritis and her abdomen is less tender today.  The patient also appears much more alert and awake today and in good spirits.  She does report that she did sleep well last night due to abdominal pain but the pain is not bothering her at the present time.  I would continue conservative management of this patient.  Nothing further to do from a GI point of view. Dr. Bonna Gains will be on service starting tomorrow and if there are any concerns or issues you can contact her.   LOS: 2 days   [name] 04/22/2019, 11:57 AM

## 2019-04-22 NOTE — Progress Notes (Signed)
[place] [facility] [place] NAME: [name]    MR#:  [id number]  DATE OF BIRTH:  September 09, 1940  SUBJECTIVE:  CHIEF COMPLAINT:   Chief Complaint  Patient presents with  . Diarrhea  . Abdominal Pain   In with abdominal pain and enteritis.  Started on antibiotics.  She is more alert and oriented today and says that she is feeling slightly better than yesterday. She did not had good sleep last night due to abdominal pain but resolved now.  Have some diarrhea. No bleeding.  REVIEW OF SYSTEMS:  CONSTITUTIONAL: No fever, she have fatigue or weakness.  EYES: No blurred or double vision.  EARS, NOSE, AND THROAT: No tinnitus or ear pain.  RESPIRATORY: No cough, shortness of breath, wheezing or hemoptysis.  CARDIOVASCULAR: No chest pain, orthopnea, edema.  GASTROINTESTINAL: No nausea, vomiting, diarrhea, she have abdominal pain.  GENITOURINARY: No dysuria, hematuria.  ENDOCRINE: No polyuria, nocturia,  HEMATOLOGY: No anemia, easy bruising or bleeding SKIN: No rash or lesion. MUSCULOSKELETAL: No joint pain or arthritis.   NEUROLOGIC: No tingling, numbness, weakness.  PSYCHIATRY: No anxiety or depression.   ROS  DRUG ALLERGIES:   Allergies  Allergen Reactions  . Codeine Other (See Comments)    Reaction unknown     VITALS:  Blood pressure 93/65, pulse 93, temperature 98.2 F (36.8 C), resp. rate 18, height 5\' 2"  (1.575 m), weight 50.1 kg, SpO2 100 %.  PHYSICAL EXAMINATION:  GENERAL:  78 y.o.-year-old patient appears malnourished, lying in the bed with some acute distress.  EYES: Pupils equal, round, reactive to light and accommodation. No scleral icterus. Extraocular muscles intact.  HEENT: Head atraumatic, normocephalic. Oropharynx and nasopharynx clear.  NECK:  Supple, no jugular venous distention. No thyroid enlargement, no tenderness.  LUNGS: Normal breath sounds bilaterally, no wheezing, rales,rhonchi or crepitation. No use of accessory  muscles of respiration.  Permacath in place in the right upper chest. CARDIOVASCULAR: S1, S2 normal. No murmurs, rubs, or gallops.  ABDOMEN: Soft, generalized tender, nondistended. Bowel sounds present. No organomegaly or mass.  Peritoneal dialysis catheter in place EXTREMITIES: No pedal edema, cyanosis, or clubbing.  NEUROLOGIC: Cranial nerves II through XII are intact. Muscle strength 3-4/5 in all extremities. Sensation intact. Gait not checked.  PSYCHIATRIC: The patient is alert and oriented x 3.  SKIN: No obvious rash, lesion, or ulcer.   Physical Exam LABORATORY PANEL:   CBC Recent Labs  Lab 04/22/19 0611  WBC 11.2*  HGB 6.8*  HCT 22.3*  PLT 135*   ------------------------------------------------------------------------------------------------------------------  Chemistries  Recent Labs  Lab 04/21/19 0928 04/22/19 0611  NA 131* 129*  K 2.8* 3.3*  CL 97* 97*  CO2 23 21*  GLUCOSE 183* 160*  BUN 43* 50*  CREATININE 5.89* 5.67*  CALCIUM 7.1* 6.5*  MG 1.7  --   AST  --  18  ALT  --  19  ALKPHOS  --  113  BILITOT  --  0.6   ------------------------------------------------------------------------------------------------------------------  Cardiac Enzymes No results for input(s): TROPONINI in the last 168 hours. ------------------------------------------------------------------------------------------------------------------  RADIOLOGY:  No results found.  ASSESSMENT AND PLAN:   Active Problems:   Enteritis  78 year old female admitted for gastroenteritis. 1.  enteritis:  Continue ceftriaxone and Flagyl. As this is repeated episode Called GI consult. Suggested to continue treating with medications. 2.  Hypertension: The patient currently has low blood pressure.  Hold antihypertensive medications.   3.  Hyponatremia: Worse than baseline; continue midodrine 4.  DVT prophylaxis:  Heparin 5.  GI prophylaxis: None 6.  End-stage renal disease on peritoneal  dialysis at home-does not seem to be tolerating well in last few days. Called nephrology consult to help in the hospital. Received hemodialysis  , now back on PD. 7.  Protein calorie malnutrition Patient appears to be severely malnourished.. With having history of cancer, end-stage renal disease on dialysis, colitis and enteritis, malnourished, generalized weakness-she does not seem to have very good prognosis.  8.  Altered mental status-metabolic encephalopathy due to uremia and infection Patient missed peritoneal dialysis for last 2 to 3 days before coming to hospital. Nephrologist to help.  9. Anemia due to ESRD   Transfuse today. I discussed with pt about possible side effects and need of transfusion, she approved.  We will get physical therapy to get idea on discharge needs.  All the records are reviewed and case discussed with Care Management/Social Workerr. Management plans discussed with the patient, family and they are in agreement.  CODE STATUS: DNR  TOTAL TIME TAKING CARE OF THIS PATIENT: 35 minutes.    POSSIBLE D/C IN 1 DAYS, DEPENDING ON CLINICAL CONDITION.   [name] M.D on 04/22/2019   Between 7am to 6pm - Pager - [phone]  After 6pm go to [email or web address] - password [email or web address] [facility]  Office  [phone]  CC: Primary care physician; Marie Bis, MD  Note: This dictation was prepared with Dragon dictation along with smaller phrase technology. Any transcriptional errors that result from this process are unintentional.

## 2019-04-22 NOTE — Progress Notes (Signed)
[facility]  ROUNDING NOTE   Subjective:   Peritoneal dialysis treatment last night. Effluent milky. UF of 4018mL.   Patient states her diarrhea is persistent. Pain is well controlled.   Scheduled for PRBC transfusion later today.   Objective:  Vital signs in last 24 hours:  Temp:  [97.4 F (36.3 C)-98.4 F (36.9 C)] 98.2 F (36.8 C) (08/23 1122) Pulse Rate:  [76-93] 93 (08/23 1122) Resp:  [15-18] 18 (08/23 1122) BP: (84-96)/(46-65) 93/65 (08/23 1122) SpO2:  [100 %] 100 % (08/23 1122) Weight:  [50.1 kg] 50.1 kg (08/23 0500)  Weight change: 4.1 kg Filed Weights   04/19/19 2156 04/21/19 0500 04/22/19 0500  Weight: 42.2 kg 46 kg 50.1 kg    Intake/Output: I/O last 3 completed shifts: In: 857.8 [P.O.:160; I.V.:97.7; IV Piggyback:600.1] Out: -    Intake/Output this shift:  Total I/O In: -  Out: 4095 [Other:4095]  Physical Exam: General: NAD, laying in bed  Head: Normocephalic, atraumatic. Moist oral mucosal membranes  Eyes: Anicteric, PERRL  Neck: Supple, trachea midline  Lungs:  Clear to auscultation  Heart: Regular rate and rhythm  Abdomen:  Tender to palpation  Extremities: no peripheral edema.  Neurologic: Nonfocal, moving all four extremities  Skin: No lesions  Access: PD catheter, RIJ permcath    Basic Metabolic Panel: Recent Labs  Lab 04/19/19 2206 04/20/19 1932 04/21/19 0928 04/22/19 0611  NA 121*  --  131* 129*  K 3.6  --  2.8* 3.3*  CL 86*  --  97* 97*  CO2 16*  --  23 21*  GLUCOSE 93  --  183* 160*  BUN 99*  --  43* 50*  CREATININE 10.37* 4.82* 5.89* 5.67*  CALCIUM 7.0*  --  7.1* 6.5*  MG  --   --  1.7  --     Liver Function Tests: Recent Labs  Lab 04/19/19 2206 04/22/19 0611  AST 29 18  ALT 28 19  ALKPHOS 127* 113  BILITOT 0.6 0.6  PROT 4.8* 4.3*  ALBUMIN 2.5* 1.9*   Recent Labs  Lab 04/19/19 2206  LIPASE 56*   No results for input(s): AMMONIA in the last 168 hours.  CBC: Recent Labs  Lab 04/19/19 2206  04/22/19 0611  WBC 19.8* 11.2*  NEUTROABS 12.2*  --   HGB 7.2* 6.8*  HCT 21.7* 22.3*  MCV 86.8 92.1  PLT 147* 135*    Cardiac Enzymes: No results for input(s): CKTOTAL, CKMB, CKMBINDEX, TROPONINI in the last 168 hours.  BNP: Invalid input(s): POCBNP  CBG: No results for input(s): GLUCAP in the last 168 hours.  Microbiology: Results for orders placed or performed during the hospital encounter of 04/19/19  Body fluid culture ( includes gram stain)     Status: None (Preliminary result)   Collection Time: 04/19/19  5:20 PM   Specimen: Peritoneal Cavity; Peritoneal Fluid  Result Value Ref Range Status   Specimen Description   Final    PERITONEAL CAVITY Performed at [facility], [address], [place], [place] [address]    Special Requests   Final    NONE Performed at [facility], [address], [place], [place] [address]    Gram Stain PENDING  Incomplete   Culture   Final    NO GROWTH 2 DAYS Performed at [facility], [place] [address], [place], [place] [address]    Report Status PENDING  Incomplete  SARS Coronavirus 2 [facility] order, Performed in [facility] hospital lab)  Status: None   Collection Time: 04/19/19 10:16 PM  Result Value Ref Range Status   SARS Coronavirus 2 NEGATIVE NEGATIVE Final    Comment: (NOTE) If result is NEGATIVE SARS-CoV-2 target nucleic acids are NOT DETECTED. The SARS-CoV-2 RNA is generally detectable in upper and lower  respiratory specimens during the acute phase of infection. The lowest  concentration of SARS-CoV-2 viral copies this assay can detect is 250  copies / mL. A negative result does not preclude SARS-CoV-2 infection  and should not be used as the sole basis for treatment or other  patient management decisions.  A negative result may occur with  improper specimen collection / handling, submission of specimen other  than nasopharyngeal swab, presence of viral mutation(s) within the  areas targeted  by this assay, and inadequate number of viral copies  (<250 copies / mL). A negative result must be combined with clinical  observations, patient history, and epidemiological information. If result is POSITIVE SARS-CoV-2 target nucleic acids are DETECTED. The SARS-CoV-2 RNA is generally detectable in upper and lower  respiratory specimens dur ing the acute phase of infection.  Positive  results are indicative of active infection with SARS-CoV-2.  Clinical  correlation with patient history and other diagnostic information is  necessary to determine patient infection status.  Positive results do  not rule out bacterial infection or co-infection with other viruses. If result is PRESUMPTIVE POSTIVE SARS-CoV-2 nucleic acids MAY BE PRESENT.   A presumptive positive result was obtained on the submitted specimen  and confirmed on repeat testing.  While 2019 novel coronavirus  (SARS-CoV-2) nucleic acids may be present in the submitted sample  additional confirmatory testing may be necessary for epidemiological  and / or clinical management purposes  to differentiate between  SARS-CoV-2 and other Sarbecovirus currently known to infect humans.  If clinically indicated additional testing with an alternate test  methodology [phone]) is advised. The SARS-CoV-2 RNA is generally  detectable in upper and lower respiratory sp ecimens during the acute  phase of infection. The expected result is Negative. Fact Sheet for Patients:  StrictlyIdeas.no Fact Sheet for Healthcare Providers: BankingDealers.co.za This test is not yet approved or cleared by the [place] FDA and has been authorized for detection and/or diagnosis of SARS-CoV-2 by FDA under an Emergency Use Authorization (EUA).  This EUA will remain in effect (meaning this test can be used) for the duration of the COVID-19 declaration under Section 564(b)(1) of the Act, 21 U.S.C. section  360bbb-3(b)(1), unless the authorization is terminated or revoked sooner. Performed at [facility], [address] [place], [place] [address]   Blood Culture (routine x 2)     Status: None (Preliminary result)   Collection Time: 04/19/19 11:52 PM   Specimen: BLOOD  Result Value Ref Range Status   Specimen Description BLOOD LEFT ANTECUBITAL  Final   Special Requests   Final    BOTTLES DRAWN AEROBIC AND ANAEROBIC Blood Culture adequate volume   Culture   Final    NO GROWTH 2 DAYS Performed at [facility], [address] [place], [place] [address]    Report Status PENDING  Incomplete  Blood Culture (routine x 2)     Status: None (Preliminary result)   Collection Time: 04/19/19 11:52 PM   Specimen: BLOOD  Result Value Ref Range Status   Specimen Description BLOOD BLOOD LEFT FOREARM  Final   Special Requests   Final    BOTTLES DRAWN AEROBIC AND ANAEROBIC Blood Culture results may  not be optimal due to an inadequate volume of blood received in culture bottles   Culture   Final    NO GROWTH 2 DAYS Performed at [facility], [address] [place], [place] [address]    Report Status PENDING  Incomplete    Coagulation Studies: Recent Labs    04/19/19 2204/12/14  LABPROT 14.3  INR 1.1    Urinalysis: No results for input(s): COLORURINE, LABSPEC, PHURINE, GLUCOSEU, HGBUR, BILIRUBINUR, KETONESUR, PROTEINUR, UROBILINOGEN, NITRITE, LEUKOCYTESUR in the last 72 hours.  Invalid input(s): APPERANCEUR    Imaging: No results found.   Medications:   . 0.9 % NaCl with KCl 40 mEq / L 50 mL/hr (04/22/19 0327)  . cefTRIAXone (ROCEPHIN)  IV Stopped (04/21/19 1930)  . dialysis solution 1.5% low-MG/low-CA    . metronidazole 500 mg (04/22/19 1124)   . sodium chloride   Intravenous Once  . Chlorhexidine Gluconate Cloth  6 each Topical Q0600  . docusate sodium  100 mg Oral BID  . feeding supplement (NEPRO CARB STEADY)  237 mL Oral BID BM  . feeding supplement  (PRO-STAT SUGAR FREE 64)  30 mL Oral TID WC  . gentamicin cream  1 application Topical Daily  . heparin  5,000 Units Subcutaneous Q8H  . midodrine  10 mg Oral TID PC  . nystatin  5 mL Mouth/Throat QID  . pantoprazole (PROTONIX) IV  40 mg Intravenous Q24H   acetaminophen **OR** acetaminophen, heparin, HYDROcodone-acetaminophen, morphine injection, ondansetron **OR** ondansetron (ZOFRAN) IV, traZODone  Assessment/ Plan:  Ms. DALMA PANCHAL is a 78 y.o. 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   [facility] Peritoneal dialysis 43.5kg CCPD 9 hours 1.5 Liters 5 exchanges.   1. End-stage renal disease with uremia 2. Hyponatremia 3. Metabolic acidosis 4. Anemia of chronic kidney disease : hemoglobin 6.8.  5. Secondary Hyperparathyroidism with Hypocalcemia and hyperphosphatemia.   6. Hypotension: on midodrine 10mg  tid 7. Hypokalemia  Plan Required back up hemodialysis treatment 8/21.  - Currently on IV fluids: normal saline with potassium chloride.  - Peritoneal dialysis scheduled for tonight. Orders prepared.  - PRBC transfusion for today.    LOS: 2 Ayub Kirsh 8/23/202012:18 PM

## 2019-04-22 NOTE — Progress Notes (Signed)
PT Cancellation Note  Patient Details Name: [name] MRN: [id number] DOB: 02-Jan-1941   Cancelled Treatment:    Reason Eval/Treat Not Completed: Medical issues which prohibited therapy(Last  labs revealing of Hb: 6.8/HCT: 22.3 and most recent BP: 84/51mmHg. Will hold PT evaluation until patient is medically appropriate.)  9:41 AM, 04/22/19 Etta Grandchild, PT, DPT Physical Therapist - [facility]  [phone] ([place])    [place] C 04/22/2019, 9:41 AM

## 2019-04-22 NOTE — Progress Notes (Signed)
Tolerated tx well. 2277ml initial drain/ 1884 uf

## 2019-04-23 LAB — BASIC METABOLIC PANEL
Anion gap: 13 (ref 5–15)
BUN: 57 mg/dL — ABNORMAL HIGH (ref 8–23)
CO2: 18 mmol/L — ABNORMAL LOW (ref 22–32)
Calcium: 7 mg/dL — ABNORMAL LOW (ref 8.9–10.3)
Chloride: 99 mmol/L (ref 98–111)
Creatinine, Ser: 6.03 mg/dL — ABNORMAL HIGH (ref 0.44–1.00)
GFR calc Af Amer: 7 mL/min — ABNORMAL LOW (ref >60)
GFR calc non Af Amer: 6 mL/min — ABNORMAL LOW (ref >60)
Glucose, Bld: 268 mg/dL — ABNORMAL HIGH (ref 70–99)
Potassium: 3.1 mmol/L — ABNORMAL LOW (ref 3.5–5.1)
Sodium: 130 mmol/L — ABNORMAL LOW (ref 135–145)

## 2019-04-23 LAB — CBC
HCT: 27.6 % — ABNORMAL LOW (ref 36.0–46.0)
Hemoglobin: 8.9 g/dL — ABNORMAL LOW (ref 12.0–15.0)
MCH: 28.8 pg (ref 26.0–34.0)
MCHC: 32.2 g/dL (ref 30.0–36.0)
MCV: 89.3 fL (ref 80.0–100.0)
Platelets: 125 10*3/uL — ABNORMAL LOW (ref 150–400)
RBC: 3.09 MIL/uL — ABNORMAL LOW (ref 3.87–5.11)
RDW: 16.3 % — ABNORMAL HIGH (ref 11.5–15.5)
WBC: 15.5 10*3/uL — ABNORMAL HIGH (ref 4.0–10.5)
nRBC: 0.2 % (ref 0.0–0.2)

## 2019-04-23 LAB — TYPE AND SCREEN
ABO/RH(D): A POS
Antibody Screen: NEGATIVE
Unit division: 0

## 2019-04-23 LAB — PROTEIN, BODY FLUID (OTHER): Total Protein, Body Fluid Other: 0.8 g/dL

## 2019-04-23 LAB — BPAM RBC
Blood Product Expiration Date: 202009152359
ISSUE DATE / TIME: 202008231402
Unit Type and Rh: 6200

## 2019-04-23 LAB — PATHOLOGIST SMEAR REVIEW

## 2019-04-23 MED ORDER — SPIRONOLACTONE 25 MG PO TABS: 25.0000 mg | ORAL_TABLET | Freq: Every day | ORAL | 11 refills | Status: AC

## 2019-04-23 MED ORDER — CEFUROXIME AXETIL 250 MG PO TABS: 250.0000 mg | ORAL_TABLET | Freq: Two times a day (BID) | ORAL | 0 refills | Status: AC

## 2019-04-23 MED ORDER — CEFUROXIME AXETIL 250 MG PO TABS: 250.0000 mg | ORAL_TABLET | Freq: Two times a day (BID) | ORAL | 0 refills | Status: DC

## 2019-04-23 MED ORDER — METRONIDAZOLE 250 MG PO TABS: 250.0000 mg | ORAL_TABLET | Freq: Three times a day (TID) | ORAL | 0 refills | Status: AC

## 2019-04-23 MED ORDER — HYDROCODONE-ACETAMINOPHEN 5-325 MG PO TABS: 1.0000 | ORAL_TABLET | Freq: Four times a day (QID) | ORAL | 0 refills | Status: AC | PRN

## 2019-04-23 MED ORDER — FLUCONAZOLE 50 MG PO TABS
50.0000 mg | ORAL_TABLET | Freq: Once | ORAL | Status: AC
  Administered 2019-04-23: 09:00:00 50 mg via ORAL
  Filled 2019-04-23: qty 1

## 2019-04-23 NOTE — Progress Notes (Signed)
Received MD order to discharge patient to home, reviewed home meds, discharge instructions, prescriptions and follow up appointments with patient and patient verbalized understanding    

## 2019-04-23 NOTE — Evaluation (Signed)
Physical Therapy Evaluation Patient Details Name: [name] MRN: [id number] DOB: [birth year] Today's Date: 04/23/2019   History of Present Illness  From MD H&P: The patient is a 78 yo female with past medical history of ESRD on peritoneal dialysis, hypertension, diabetes, cirrhosis, and esophageal varices presents to the emergency department from home due to abdominal pain and diarrhea.  Patient also reports nausea and vomiting.  CT of the patient's abdomen shows possible enteritis and no definite evidence of SBO.  MD assessment includes: enteritis, HTN, hyponatremia, ESRD on peritoneal dialysis, protien calorie malnutrition, AMS, and anemia.    Clinical Impression  Pt presents with deficits in strength, transfers, mobility, gait, balance, and activity tolerance but overall performed well during the session.  Pt required extra time and effort and the use of bed rails during sup to sit but did not require any physical assistance.  Pt was CGA with transfers with good eccentric and concentric control.  Pt was able to amb 20' with a RW and CGA with slow, cautious cadence and short B step length but was steady without LOB.  Pt was receiving HHPT services prior to this admission and is appropriate to continue with HHPT upon discharge to address the above deficits for decreased caregiver assistance and decreased risk of further functional decline.     Follow Up Recommendations Home health PT;Other (comment)(Pt currently receiving HHPT)    Equipment Recommendations  None recommended by PT    Recommendations for Other Services       Precautions / Restrictions Precautions Precautions: Fall Restrictions Weight Bearing Restrictions: No      Mobility  Bed Mobility Overal bed mobility: Modified Independent             General bed mobility comments: Extra time and effort but no physical assist required  Transfers Overall transfer level: Needs assistance Equipment used: Rolling walker  (2 wheeled) Transfers: Sit to/from Stand Sit to Stand: Min guard         General transfer comment: Good eccentric and concentric control with transfers from various height surfaces  Ambulation/Gait Ambulation/Gait assistance: Min guard Gait Distance (Feet): 20 Feet Assistive device: Rolling walker (2 wheeled) Gait Pattern/deviations: Step-through pattern;Decreased step length - right;Decreased step length - left Gait velocity: decreased   General Gait Details: Slow cadence with short B step length but steady without LOB  Stairs            Wheelchair Mobility    Modified Rankin (Stroke Patients Only)       Balance Overall balance assessment: Needs assistance Sitting-balance support: Single extremity supported;Feet supported Sitting balance-Leahy Scale: Good     Standing balance support: Bilateral upper extremity supported;During functional activity;No upper extremity supported Standing balance-Leahy Scale: Good Standing balance comment: Pt steady during dynamic balance training with reaching outside BOS                             Pertinent Vitals/Pain Pain Assessment: No/denies pain    Home Living Family/patient expects to be discharged to:: Private residence Living Arrangements: Children Available Help at Discharge: Family;Available 24 hours/day;Personal care attendant Type of Home: Apartment Home Access: Stairs to enter Entrance Stairs-Rails: None Entrance Stairs-Number of Steps: 1 small threshold step Home Layout: One level Home Equipment: Walker - 2 wheels;Shower seat - built in;Grab bars - tub/shower;Bedside commode;Cane - quad;Wheelchair - manual      Prior Function Level of Independence: Needs assistance   Gait / Transfers Assistance  Needed: SBA with amb household distances with a QC, 1 fall in the last 6 months, uses a w/c for community access  ADL's / Homemaking Assistance Needed: Assist from PCA/daughter with ADLs        Hand  Dominance   Dominant Hand: Right    Extremity/Trunk Assessment   Upper Extremity Assessment Upper Extremity Assessment: Generalized weakness    Lower Extremity Assessment Lower Extremity Assessment: Generalized weakness       Communication   Communication: No difficulties  Cognition Arousal/Alertness: Awake/alert Behavior During Therapy: WFL for tasks assessed/performed Overall Cognitive Status: Within Functional Limits for tasks assessed                                        General Comments      Exercises Total Joint Exercises Ankle Circles/Pumps: AROM;Both;10 reps Quad Sets: Strengthening;Both;10 reps Gluteal Sets: Strengthening;Both;10 reps Heel Slides: AROM;Both;5 reps Hip ABduction/ADduction: Strengthening;Both;10 reps Long Arc Quad: Strengthening;Both;10 reps Knee Flexion: Both;10 reps;Strengthening Marching in Standing: AROM;Both;10 reps;Standing Other Exercises Other Exercises: HEP education for BLE APs, QS, GS, and LAQs x 10 each 5-6x/day   Assessment/Plan    PT Assessment Patient needs continued PT services  PT Problem List Decreased strength;Decreased activity tolerance;Decreased balance       PT Treatment Interventions DME instruction;Gait training;Stair training;Functional mobility training;Therapeutic activities;Therapeutic exercise;Balance training;Patient/family education    PT Goals (Current goals can be found in the Care Plan section)  Acute Rehab PT Goals Patient Stated Goal: To continue to get stronger with PT at home PT Goal Formulation: With patient Time For Goal Achievement: 05/06/19 Potential to Achieve Goals: Good    Frequency Min 2X/week   Barriers to discharge        Co-evaluation               AM-PAC PT "6 Clicks" Mobility  Outcome Measure Help needed turning from your back to your side while in a flat bed without using bedrails?: A Little Help needed moving from lying on your back to sitting on the  side of a flat bed without using bedrails?: A Little Help needed moving to and from a bed to a chair (including a wheelchair)?: A Little Help needed standing up from a chair using your arms (e.g., wheelchair or bedside chair)?: A Little Help needed to walk in hospital room?: A Little Help needed climbing 3-5 steps with a railing? : A Little 6 Click Score: 18    End of Session Equipment Utilized During Treatment: Gait belt Activity Tolerance: Patient tolerated treatment well Patient left: in chair;with call bell/phone within reach;with chair alarm set Nurse Communication: Mobility status PT Visit Diagnosis: Difficulty in walking, not elsewhere classified (R26.2);Muscle weakness (generalized) (M62.81)    Time: 4097-3532 PT Time Calculation (min) (ACUTE ONLY): 25 min   Charges:   PT Evaluation $PT Eval Low Complexity: 1 Low PT Treatments $Therapeutic Exercise: 8-22 mins        D. Scott Dezeray Puccio PT, DPT 04/23/19, 11:50 AM

## 2019-04-23 NOTE — TOC Transition Note (Signed)
Transition of Care [facility]) - CM/SW Discharge Note   Patient Details  Name: [name] MRN: [id number] Date of Birth: [birth year]  [facility] CM/SW Contact:  Markos Theil, Lenice Llamas Phone Number: [phone]  04/23/2019, 12:20 PM   Clinical Narrative: Clinical Social Worker (CSW) notified [place] representative that patient will D/C home today and continue home health through [facility]. CSW notified [place] hospice liaison that patient will D/C home today and will continue outpatient palliative. RN aware of above. Please reconsult if future social work needs arise. CSW signing off.     Final next level of care: [place] Barriers to Discharge: Barriers Resolved   Patient Goals and CMS Choice Patient states their goals for this hospitalization and ongoing recovery are:: "I don't know" CMS Medicare.gov Compare Post Acute Care list provided to:: (NA) Choice offered to / list presented to : NA  Discharge Placement                    Patient and family notified of of transfer: 04/23/19  Discharge Plan and Services   Discharge Planning Services: CM Consult Post Acute Care Choice: Home Health          DME Arranged: N/A         HH Arranged: PT, RN, Nurse's Aide [place] Agency: [place] ([place]) Date HH Agency Contacted: 04/23/19 Time HH Agency Contacted: 1000 Representative spoke with at [place]: [place] ([place]) Interventions     Readmission Risk Interventions Readmission Risk Prevention Plan 04/04/2019 03/18/2019  Transportation Screening Complete Complete  Medication Review Press photographer) Complete -  [place] or Home Care Consult Complete -  SW Recovery Care/Counseling Consult Complete -  Palliative Care Screening Not Applicable -  [place] Not Applicable -  Some recent data might be hidden

## 2019-04-23 NOTE — Discharge Summary (Addendum)
[facility] NAME: [name]    MR#:  [id number]  DATE OF BIRTH:  [birth year]  DATE OF ADMISSION:  04/19/2019 ADMITTING PHYSICIAN: Harrie Foreman, MD  D DATE OF DISCHARGE: 04/23/2019   PRIMARY CARE PHYSICIAN: Caryl Bis, MD    ADMISSION DIAGNOSIS:  Generalized abdominal pain [R10.84] Sepsis, due to unspecified organism, unspecified whether acute organ dysfunction present ([place]) [A41.9]  DISCHARGE DIAGNOSIS:  Active Problems:   Enteritis   SECONDARY DIAGNOSIS:   Past Medical History:  Diagnosis Date  . 10 year risk of MI or stroke 7.5% or greater   . Anemia   . Ascites   . Bursitis   . CKD (chronic kidney disease)   . Diabetes mellitus ([place])   . Esophageal varices ([place])   . ESRD (end stage renal disease) ([place]) 12/2018   to start dialysis  . FHx: migraine headaches   . Gastritis and duodenitis   . GERD (gastroesophageal reflux disease)   . Hiatal hernia   . Hypercholesterolemia   . Hypertension   . Kidney stone   . Liver cirrhosis ([place])   . Melena   . Portal hypertension ([place])   . Renal cell carcinoma ([place])   . Renal lithiasis   . Takotsubo cardiomyopathy 06/2013    HOSPITAL COURSE:   1. enteritis:  Continue ceftriaxone and Flagyl. As this is repeated episode Called GI consult. Suggested to continue treating with medications.  much improved- give oral cefuroxime and flagyl on discharge. Will give norco tabs to help with pain. 2. Hypertension: The patient currently has low blood pressure. Hold antihypertensive medications.  3. Hyponatremia: Worse than baseline; continue midodrine, improved after HD now. 4. DVT prophylaxis: Heparin 5. GI prophylaxis: None 6.  End-stage renal disease on peritoneal dialysis at home-does not seem to be tolerating well in last few days. Called nephrology consult to help in the hospital. Received hemodialysis  , now back on PD. 7.  Protein calorie  malnutrition Patient appears to be severely malnourished.. With having history of cancer, end-stage renal disease on dialysis, colitis and enteritis, malnourished, generalized weakness-she does not seem to have very good prognosis.   As per daughter, they will be open for hospice- if she worsened in future and does not improve. 8.  Altered mental status-metabolic encephalopathy due to uremia and infection Patient missed peritoneal dialysis for last 2 to 3 days before coming to hospital. Nephrologist Helped with HD, much improved.  9. Anemia due to ESRD   Transfuse one unit PRBC.stable now.  DISCHARGE CONDITIONS:   Stable.  CONSULTS OBTAINED:  Treatment Team:  Jonathon Bellows, MD  DRUG ALLERGIES:   Allergies  Allergen Reactions  . Codeine Other (See Comments)    Reaction unknown     DISCHARGE MEDICATIONS:   Allergies as of 04/23/2019      Reactions   Codeine Other (See Comments)   Reaction unknown       Medication List    TAKE these medications   acetaminophen 500 MG tablet Commonly known as: TYLENOL Take 500 mg by mouth every 6 (six) hours as needed for mild pain or fever.   atorvastatin 20 MG tablet Commonly known as: LIPITOR Take 20 mg by mouth daily.   buPROPion 100 MG 12 hr tablet Commonly known as: WELLBUTRIN SR Take 100 mg by mouth daily.   cefUROXime 250 MG tablet Commonly known as: Ceftin Take 1 tablet (250 mg total) by mouth 2 (two) times  daily for 5 days.   cholecalciferol 1000 units tablet Commonly known as: VITAMIN D Take 2,000 Units by mouth daily.   feeding supplement (PRO-STAT SUGAR FREE 64) Liqd Take 30 mLs by mouth 2 (two) times daily.   FLUoxetine 40 MG capsule Commonly known as: PROZAC Take 40 mg by mouth daily.   HYDROcodone-acetaminophen 5-325 MG tablet Commonly known as: NORCO/VICODIN Take 1 tablet by mouth every 6 (six) hours as needed for moderate pain or severe pain.   lactulose 10 GM/15ML solution Commonly known as:  CHRONULAC Take 10 g by mouth 2 (two) times daily.   metroNIDAZOLE 250 MG tablet Commonly known as: Flagyl Take 1 tablet (250 mg total) by mouth 3 (three) times daily for 5 days.   midodrine 10 MG tablet Commonly known as: PROAMATINE Take 10 mg by mouth 3 (three) times daily after meals.   Nephro Vitamins 0.8 MG Tabs Take 1 tablet by mouth daily.   ondansetron 4 MG tablet Commonly known as: ZOFRAN Take 4 mg by mouth every 6 (six) hours as needed.   pantoprazole 40 MG tablet Commonly known as: PROTONIX Take 40 mg by mouth 2 (two) times daily.   PreserVision AREDS Tabs Take 1 tablet by mouth daily.   spironolactone 25 MG tablet Commonly known as: Aldactone Take 1 tablet (25 mg total) by mouth daily.   traZODone 50 MG tablet Commonly known as: DESYREL Take 100 mg by mouth at bedtime.   vitamin B-12 500 MCG tablet Commonly known as: CYANOCOBALAMIN Take 500 mcg by mouth daily.        DISCHARGE INSTRUCTIONS:    Follow with PMD in 1-2 weeks.  If you experience worsening of your admission symptoms, develop shortness of breath, life threatening emergency, suicidal or homicidal thoughts you must seek medical attention immediately by calling 911 or calling your MD immediately  if symptoms less severe.  You Must read complete instructions/literature along with all the possible adverse reactions/side effects for all the Medicines you take and that have been prescribed to you. Take any new Medicines after you have completely understood and accept all the possible adverse reactions/side effects.   Please note  You were cared for by a hospitalist during your hospital stay. If you have any questions about your discharge medications or the care you received while you were in the hospital after you are discharged, you can call the unit and asked to speak with the hospitalist on call if the hospitalist that took care of you is not available. Once you are discharged, your primary care  physician will handle any further medical issues. Please note that NO REFILLS for any discharge medications will be authorized once you are discharged, as it is imperative that you return to your primary care physician (or establish a relationship with a primary care physician if you do not have one) for your aftercare needs so that they can reassess your need for medications and monitor your lab values.    Today   CHIEF COMPLAINT:   Chief Complaint  Patient presents with  . Diarrhea  . Abdominal Pain    HISTORY OF PRESENT ILLNESS:  Lynsee Wands  is a 78 y.o. female with a known history of ESRD on peritoneal dialysis, hypertension, diabetes, cirrhosis, and esophageal varices presents to the emergency department from home due to abdominal pain and diarrhea.  Patient also reports nausea and vomiting.  She denies seeing blood in her stool or emesis.  She also denies fevers.  CT of the  patient's abdomen shows possible enteritis and no definite evidence of SBO.  She was given ceftriaxone and vancomycin in the emergency department prior to the hospital service being called for admission.  VITAL SIGNS:  Blood pressure (!) 100/57, pulse 89, temperature 98.3 F (36.8 C), temperature source Oral, resp. rate 16, height 5\' 2"  (1.575 m), weight 50.1 kg, SpO2 98 %.  I/O:    Intake/Output Summary (Last 24 hours) at 04/23/2019 1313 Last data filed at 04/23/2019 0900 Gross per 24 hour  Intake 480 ml  Output -  Net 480 ml    PHYSICAL EXAMINATION:  GENERAL:  78 y.o.-year-old patient lying in the bed with no acute distress.  EYES: Pupils equal, round, reactive to light and accommodation. No scleral icterus. Extraocular muscles intact.  HEENT: Head atraumatic, normocephalic. Oropharynx and nasopharynx clear.  NECK:  Supple, no jugular venous distention. No thyroid enlargement, no tenderness.  LUNGS: Normal breath sounds bilaterally, no wheezing, rales,rhonchi or crepitation. No use of accessory  muscles of respiration.  CARDIOVASCULAR: S1, S2 normal. No murmurs, rubs, or gallops.  ABDOMEN: Soft, non-tender, non-distended. Bowel sounds present. No organomegaly or mass.  EXTREMITIES: No pedal edema, cyanosis, or clubbing.  NEUROLOGIC: Cranial nerves II through XII are intact. Muscle strength 4/5 in all extremities. Sensation intact. Gait not checked.  PSYCHIATRIC: The patient is alert and oriented x 3.  SKIN: No obvious rash, lesion, or ulcer.   DATA REVIEW:   CBC Recent Labs  Lab 04/23/19 0408  WBC 15.5*  HGB 8.9*  HCT 27.6*  PLT 125*    Chemistries  Recent Labs  Lab 04/21/19 0928 04/22/19 0611 04/23/19 0408  NA 131* 129* 130*  K 2.8* 3.3* 3.1*  CL 97* 97* 99  CO2 23 21* 18*  GLUCOSE 183* 160* 268*  BUN 43* 50* 57*  CREATININE 5.89* 5.67* 6.03*  CALCIUM 7.1* 6.5* 7.0*  MG 1.7  --   --   AST  --  18  --   ALT  --  19  --   ALKPHOS  --  113  --   BILITOT  --  0.6  --     Cardiac Enzymes No results for input(s): TROPONINI in the last 168 hours.  Microbiology Results  Results for orders placed or performed during the hospital encounter of 04/19/19  Body fluid culture ( includes gram stain)     Status: None (Preliminary result)   Collection Time: 04/19/19  5:20 PM   Specimen: Peritoneal Cavity; Peritoneal Fluid  Result Value Ref Range Status   Specimen Description   Final    PERITONEAL CAVITY Performed at [facility], [address], [place], [place] [address]    Special Requests   Final    NONE Performed at [facility], [address] [place], [place] [address]    Gram Stain NO WBC SEEN NO ORGANISMS SEEN   Final   Culture   Final    NO GROWTH 3 DAYS Performed at [facility], [place] [address] [place], [place] [address]    Report Status PENDING  Incomplete  SARS Coronavirus 2 [facility] order, Performed in [facility] hospital lab)     Status: None   Collection Time: 04/19/19 10:16 PM  Result Value Ref Range Status    SARS Coronavirus 2 NEGATIVE NEGATIVE Final    Comment: (NOTE) If result is NEGATIVE SARS-CoV-2 target nucleic acids are NOT DETECTED. The SARS-CoV-2 RNA is generally detectable in upper and lower  respiratory specimens during  the acute phase of infection. The lowest  concentration of SARS-CoV-2 viral copies this assay can detect is 250  copies / mL. A negative result does not preclude SARS-CoV-2 infection  and should not be used as the sole basis for treatment or other  patient management decisions.  A negative result may occur with  improper specimen collection / handling, submission of specimen other  than nasopharyngeal swab, presence of viral mutation(s) within the  areas targeted by this assay, and inadequate number of viral copies  (<250 copies / mL). A negative result must be combined with clinical  observations, patient history, and epidemiological information. If result is POSITIVE SARS-CoV-2 target nucleic acids are DETECTED. The SARS-CoV-2 RNA is generally detectable in upper and lower  respiratory specimens dur ing the acute phase of infection.  Positive  results are indicative of active infection with SARS-CoV-2.  Clinical  correlation with patient history and other diagnostic information is  necessary to determine patient infection status.  Positive results do  not rule out bacterial infection or co-infection with other viruses. If result is PRESUMPTIVE POSTIVE SARS-CoV-2 nucleic acids MAY BE PRESENT.   A presumptive positive result was obtained on the submitted specimen  and confirmed on repeat testing.  While 2019 novel coronavirus  (SARS-CoV-2) nucleic acids may be present in the submitted sample  additional confirmatory testing may be necessary for epidemiological  and / or clinical management purposes  to differentiate between  SARS-CoV-2 and other Sarbecovirus currently known to infect humans.  If clinically indicated additional testing with an alternate test   methodology [phone]) is advised. The SARS-CoV-2 RNA is generally  detectable in upper and lower respiratory sp ecimens during the acute  phase of infection. The expected result is Negative. Fact Sheet for Patients:  StrictlyIdeas.no Fact Sheet for Healthcare Providers: BankingDealers.co.za This test is not yet approved or cleared by the [place] FDA and has been authorized for detection and/or diagnosis of SARS-CoV-2 by FDA under an Emergency Use Authorization (EUA).  This EUA will remain in effect (meaning this test can be used) for the duration of the COVID-19 declaration under Section 564(b)(1) of the Act, 21 U.S.C. section 360bbb-3(b)(1), unless the authorization is terminated or revoked sooner. Performed at [facility], [address] [place], [place] [address]   Blood Culture (routine x 2)     Status: None (Preliminary result)   Collection Time: 04/19/19 11:52 PM   Specimen: BLOOD  Result Value Ref Range Status   Specimen Description BLOOD LEFT ANTECUBITAL  Final   Special Requests   Final    BOTTLES DRAWN AEROBIC AND ANAEROBIC Blood Culture adequate volume   Culture   Final    NO GROWTH 3 DAYS Performed at [facility], [address], [place], [place] [address]    Report Status PENDING  Incomplete  Blood Culture (routine x 2)     Status: None (Preliminary result)   Collection Time: 04/19/19 11:52 PM   Specimen: BLOOD  Result Value Ref Range Status   Specimen Description BLOOD BLOOD LEFT FOREARM  Final   Special Requests   Final    BOTTLES DRAWN AEROBIC AND ANAEROBIC Blood Culture results may not be optimal due to an inadequate volume of blood received in culture bottles   Culture   Final    NO GROWTH 3 DAYS Performed at [facility], [address], [place], [place] [address]    Report Status PENDING  Incomplete    RADIOLOGY:  No results  found.  EKG:   Orders placed or  performed during the hospital encounter of 03/30/19  . ED EKG  . ED EKG  . EKG 12-Lead  . EKG 12-Lead  . EKG 12-Lead  . EKG 12-Lead  . EKG 12-Lead  . EKG 12-Lead  . EKG      Management plans discussed with the patient, family and they are in agreement.  CODE STATUS:     Code Status Orders  (From admission, onward)         Start     Ordered   04/20/19 1753  Do not attempt resuscitation (DNR)  Continuous    Question Answer Comment  In the event of cardiac or respiratory ARREST Do not call a "code blue"   In the event of cardiac or respiratory ARREST Do not perform Intubation, CPR, defibrillation or ACLS   In the event of cardiac or respiratory ARREST Use medication by any route, position, wound care, and other measures to relive pain and suffering. May use oxygen, suction and manual treatment of airway obstruction as needed for comfort.   Comments Patient awake and alert and able to make her own medical decisions.  Patient clearly wishes to be DNR/DNI going forward.  Patient's daughter [name] also confirmed the same over the phone.      04/20/19 1752        Code Status History    Date Active Date Inactive Code Status Order ID Comments User Context   04/03/2019 1605 04/06/2019 1823 DNR [id number]  Otila Back, MD Inpatient   03/30/2019 2320 04/03/2019 1605 Full Code [id number]  Dustin Flock, MD Inpatient   03/30/2019 2019 03/30/2019 2320 DNR [id number]  Dustin Flock, MD ED   03/17/2019 1717 03/22/2019 1659 DNR [id number]  Loletha Grayer, MD ED   07/02/2013 0141 07/06/2013 1655 Full Code [id number]  Stephani Police, MD Inpatient   Advance Care Planning Activity    Advance Directive Documentation     Most Recent Value  Type of Advance Directive  Healthcare Power of [place], Living will, Out of facility DNR (pink MOST or yellow form)  Pre-existing out of facility DNR order (yellow form or pink MOST form)  Pink MOST form placed in chart (order not valid for inpatient use) [scanned in]   "MOST" Form in Place?  -      TOTAL TIME TAKING CARE OF THIS PATIENT: 35 minutes.    Vaughan Basta M.D on 04/23/2019 at 1:13 PM  Between 7am to 6pm - Pager - [phone]  After 6pm go to [email or web address] - password [email or web address] [facility]  Office  [phone]  CC: Primary care physician; Caryl Bis, MD   Note: This dictation was prepared with Dragon dictation along with smaller phrase technology. Any transcriptional errors that result from this process are unintentional.

## 2019-04-23 NOTE — Progress Notes (Addendum)
[facility]  ROUNDING NOTE   Subjective:   Peritoneal dialysis treatment last night. Tolerated well Sitting up eating lunch Scheduled for PRBC transfusion later today.   Objective:  Vital signs in last 24 hours:  Temp:  [98.1 F (36.7 C)-98.8 F (37.1 C)] 98.3 F (36.8 C) (08/24 0915) Pulse Rate:  [86-97] 89 (08/24 0915) Resp:  [16-20] 16 (08/24 0915) BP: (81-108)/(52-61) 100/57 (08/24 0915) SpO2:  [98 %-100 %] 98 % (08/24 0915) Weight:  [50.1 kg] 50.1 kg (08/24 0500)  Weight change: 0 kg Filed Weights   04/21/19 0500 04/22/19 0500 04/23/19 0500  Weight: 46 kg 50.1 kg 50.1 kg    Intake/Output: I/O last 3 completed shifts: In: 1080.1 [P.O.:880; IV Piggyback:200.1] Out: 8466 [Other:4095]   Intake/Output this shift:  Total I/O In: 240 [P.O.:240] Out: -   Physical Exam: General: NAD, laying in bed  Head: Normocephalic, atraumatic. Moist oral mucosal membranes  Eyes: Anicteric,   Neck: Supple, trachea midline  Lungs:  Clear to auscultation  Heart: Regular rate and rhythm  Abdomen:  Tender to palpation  Extremities: no peripheral edema.  Neurologic: Nonfocal, moving all four extremities  Skin: No lesions  Access: PD catheter, RIJ permcath    Basic Metabolic Panel: Recent Labs  Lab 04/19/19 2206 04/20/19 1932 04/21/19 0928 04/22/19 0611 04/23/19 0408  NA 121*  --  131* 129* 130*  K 3.6  --  2.8* 3.3* 3.1*  CL 86*  --  97* 97* 99  CO2 16*  --  23 21* 18*  GLUCOSE 93  --  183* 160* 268*  BUN 99*  --  43* 50* 57*  CREATININE 10.37* 4.82* 5.89* 5.67* 6.03*  CALCIUM 7.0*  --  7.1* 6.5* 7.0*  MG  --   --  1.7  --   --     Liver Function Tests: Recent Labs  Lab 04/19/19 2206 04/22/19 0611  AST 29 18  ALT 28 19  ALKPHOS 127* 113  BILITOT 0.6 0.6  PROT 4.8* 4.3*  ALBUMIN 2.5* 1.9*   Recent Labs  Lab 04/19/19 2206  LIPASE 56*   No results for input(s): AMMONIA in the last 168 hours.  CBC: Recent Labs  Lab 04/19/19 2206  04/22/19 0611 04/22/19 2104 04/23/19 0408  WBC 19.8* 11.2*  --  15.5*  NEUTROABS 12.2*  --   --   --   HGB 7.2* 6.8* 8.6* 8.9*  HCT 21.7* 22.3* 27.3* 27.6*  MCV 86.8 92.1  --  89.3  PLT 147* 135*  --  125*    Cardiac Enzymes: No results for input(s): CKTOTAL, CKMB, CKMBINDEX, TROPONINI in the last 168 hours.  BNP: Invalid input(s): POCBNP  CBG: No results for input(s): GLUCAP in the last 168 hours.  Microbiology: Results for orders placed or performed during the hospital encounter of 04/19/19  Body fluid culture ( includes gram stain)     Status: None (Preliminary result)   Collection Time: 04/19/19  5:20 PM   Specimen: Peritoneal Cavity; Peritoneal Fluid  Result Value Ref Range Status   Specimen Description   Final    PERITONEAL CAVITY Performed at [facility], [address] [place], [place] [address]    Special Requests   Final    NONE Performed at [facility], [address] [place], [place] [address]    Gram Stain NO WBC SEEN NO ORGANISMS SEEN   Final   Culture   Final    NO GROWTH 3 DAYS Performed at  [facility], [place] [address], [place], [place] [address]    Report Status PENDING  Incomplete  SARS Coronavirus 2 [facility] order, Performed in [place] hospital lab)     Status: None   Collection Time: 04/19/19 10:16 PM  Result Value Ref Range Status   SARS Coronavirus 2 NEGATIVE NEGATIVE Final    Comment: (NOTE) If result is NEGATIVE SARS-CoV-2 target nucleic acids are NOT DETECTED. The SARS-CoV-2 RNA is generally detectable in upper and lower  respiratory specimens during the acute phase of infection. The lowest  concentration of SARS-CoV-2 viral copies this assay can detect is 250  copies / mL. A negative result does not preclude SARS-CoV-2 infection  and should not be used as the sole basis for treatment or other  patient management decisions.  A negative result may occur with  improper specimen collection / handling,  submission of specimen other  than nasopharyngeal swab, presence of viral mutation(s) within the  areas targeted by this assay, and inadequate number of viral copies  (<250 copies / mL). A negative result must be combined with clinical  observations, patient history, and epidemiological information. If result is POSITIVE SARS-CoV-2 target nucleic acids are DETECTED. The SARS-CoV-2 RNA is generally detectable in upper and lower  respiratory specimens dur ing the acute phase of infection.  Positive  results are indicative of active infection with SARS-CoV-2.  Clinical  correlation with patient history and other diagnostic information is  necessary to determine patient infection status.  Positive results do  not rule out bacterial infection or co-infection with other viruses. If result is PRESUMPTIVE POSTIVE SARS-CoV-2 nucleic acids MAY BE PRESENT.   A presumptive positive result was obtained on the submitted specimen  and confirmed on repeat testing.  While 2019 novel coronavirus  (SARS-CoV-2) nucleic acids may be present in the submitted sample  additional confirmatory testing may be necessary for epidemiological  and / or clinical management purposes  to differentiate between  SARS-CoV-2 and other Sarbecovirus currently known to infect humans.  If clinically indicated additional testing with an alternate test  methodology [phone]) is advised. The SARS-CoV-2 RNA is generally  detectable in upper and lower respiratory sp ecimens during the acute  phase of infection. The expected result is Negative. Fact Sheet for Patients:  StrictlyIdeas.no Fact Sheet for Healthcare Providers: BankingDealers.co.za This test is not yet approved or cleared by the [place] FDA and has been authorized for detection and/or diagnosis of SARS-CoV-2 by FDA under an Emergency Use Authorization (EUA).  This EUA will remain in effect (meaning this test can be  used) for the duration of the COVID-19 declaration under Section 564(b)(1) of the Act, 21 U.S.C. section 360bbb-3(b)(1), unless the authorization is terminated or revoked sooner. Performed at [facility], [address] [place], [place] [address]   Blood Culture (routine x 2)     Status: None (Preliminary result)   Collection Time: 04/19/19 11:52 PM   Specimen: BLOOD  Result Value Ref Range Status   Specimen Description BLOOD LEFT ANTECUBITAL  Final   Special Requests   Final    BOTTLES DRAWN AEROBIC AND ANAEROBIC Blood Culture adequate volume   Culture   Final    NO GROWTH 3 DAYS Performed at [facility], [address], [place], [place] [address]    Report Status PENDING  Incomplete  Blood Culture (routine x 2)     Status: None (Preliminary result)   Collection Time: 04/19/19 11:52 PM   Specimen: BLOOD  Result Value Ref Range Status   Specimen Description BLOOD BLOOD LEFT FOREARM  Final   Special Requests   Final    BOTTLES DRAWN AEROBIC AND ANAEROBIC Blood Culture results may not be optimal due to an inadequate volume of blood received in culture bottles   Culture   Final    NO GROWTH 3 DAYS Performed at [facility], [address], [place], [place] [address]    Report Status PENDING  Incomplete    Coagulation Studies: No results for input(s): LABPROT, INR in the last 72 hours.  Urinalysis: No results for input(s): COLORURINE, LABSPEC, PHURINE, GLUCOSEU, HGBUR, BILIRUBINUR, KETONESUR, PROTEINUR, UROBILINOGEN, NITRITE, LEUKOCYTESUR in the last 72 hours.  Invalid input(s): APPERANCEUR    Imaging: No results found.   Medications:   . 0.9 % NaCl with KCl 40 mEq / L 50 mL/hr (04/23/19 0428)  . cefTRIAXone (ROCEPHIN)  IV 1 g (04/22/19 1809)  . dialysis solution 1.5% low-MG/low-CA    . metronidazole 500 mg (04/23/19 0431)   . Chlorhexidine Gluconate Cloth  6 each Topical Q0600  . docusate sodium  100 mg Oral BID  . feeding  supplement (PRO-STAT SUGAR FREE 64)  30 mL Oral TID WC  . gentamicin cream  1 application Topical Daily  . heparin  5,000 Units Subcutaneous Q8H  . midodrine  10 mg Oral TID PC  . multivitamin  1 tablet Oral QHS  . nystatin  5 mL Mouth/Throat QID  . pantoprazole (PROTONIX) IV  40 mg Intravenous Q24H   acetaminophen **OR** acetaminophen, heparin, HYDROcodone-acetaminophen, morphine injection, ondansetron **OR** ondansetron (ZOFRAN) IV, traZODone  Assessment/ Plan:  [name] is a 78 y.o. 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   [facility] Peritoneal dialysis 43.5kg CCPD 9 hours 1.5 Liters 5 exchanges.   1. End-stage renal disease with uremia 2. Hyponatremia 3. Metabolic acidosis 4. Anemia of chronic kidney disease : hemoglobin 8.9.  5. Secondary Hyperparathyroidism with Hypocalcemia and hyperphosphatemia.   6. Hypotension: on midodrine 10mg  tid 7. Hypokalemia  Plan Required back up hemodialysis treatment 8/21.  - Currently on IV fluids: normal saline with potassium chloride.  - may resume PD tonight if discharged   - add spironolactone for hypokalemia     LOS: 3 [name] 8/24/202012:17 PM

## 2019-04-23 NOTE — Care Management Important Message (Signed)
Important Message  Patient Details  Name: [name] MRN: [id number] Date of Birth: 1940-09-22   Medicare Important Message Given:  Yes     [name] 04/23/2019, 11:40 AM

## 2019-04-23 NOTE — Progress Notes (Signed)
Tolerated tx well. 2139 ml initial drain/ 799 uf

## 2019-04-23 NOTE — Progress Notes (Signed)
Inpatient Diabetes Program Recommendations  AACE/ADA: New Consensus Statement on Inpatient Glycemic Control (2015)  Target Ranges:  Prepandial:   less than 140 mg/dL      Peak postprandial:   less than 180 mg/dL (1-2 hours)      Critically ill patients:  140 - 180 mg/dL   Results for LYRIK, DOCKSTADER (MRN [id number]) as of 04/23/2019 08:52  Ref. Range 04/21/2019 09:28 04/22/2019 06:11 04/23/2019 04:08  Glucose Latest Ref Range: 70 - 99 mg/dL 183 (H) 160 (H) 268 (H)   Results for LILOU, KNEIP (MRN [id number]) as of 04/23/2019 08:52  Ref. Range 10/31/2018 12:23 03/17/2019 11:30  Hemoglobin A1C Latest Ref Range: 4.8 - 5.6 % 5.6 5.9 (H)     Admit with: Abd Pain/ Gastroenteritis  History: DM, ESRD (on PD at home)  Home DM Meds: None  Current Orders: None     MD- Per H&P, patient with History of Type 2 DM.  Not on any meds at home.  Please consider starting Novolog Sensitive Correction Scale/ SSI (0-9 units) TID AC + HS      --Will follow patient during hospitalization--  Wyn Quaker RN, MSN, CDE Diabetes Coordinator Inpatient Glycemic Control Team Team Pager: [phone] (8a-5p)

## 2019-04-24 DIAGNOSIS — N186 End stage renal disease: Secondary | ICD-10-CM | POA: Diagnosis not present

## 2019-04-24 DIAGNOSIS — Z992 Dependence on renal dialysis: Secondary | ICD-10-CM | POA: Diagnosis not present

## 2019-04-24 LAB — BODY FLUID CULTURE
Culture: NO GROWTH
Gram Stain: NONE SEEN

## 2019-04-25 DIAGNOSIS — I252 Old myocardial infarction: Secondary | ICD-10-CM | POA: Diagnosis not present

## 2019-04-25 DIAGNOSIS — K7460 Unspecified cirrhosis of liver: Secondary | ICD-10-CM | POA: Diagnosis not present

## 2019-04-25 DIAGNOSIS — R188 Other ascites: Secondary | ICD-10-CM | POA: Diagnosis not present

## 2019-04-25 DIAGNOSIS — I132 Hypertensive heart and chronic kidney disease with heart failure and with stage 5 chronic kidney disease, or end stage renal disease: Secondary | ICD-10-CM | POA: Diagnosis not present

## 2019-04-25 DIAGNOSIS — I700 Atherosclerosis of aorta: Secondary | ICD-10-CM | POA: Diagnosis not present

## 2019-04-25 DIAGNOSIS — I8501 Esophageal varices with bleeding: Secondary | ICD-10-CM | POA: Diagnosis not present

## 2019-04-25 DIAGNOSIS — K766 Portal hypertension: Secondary | ICD-10-CM | POA: Diagnosis not present

## 2019-04-25 DIAGNOSIS — N186 End stage renal disease: Secondary | ICD-10-CM | POA: Diagnosis not present

## 2019-04-25 DIAGNOSIS — E875 Hyperkalemia: Secondary | ICD-10-CM | POA: Diagnosis not present

## 2019-04-25 DIAGNOSIS — I5030 Unspecified diastolic (congestive) heart failure: Secondary | ICD-10-CM | POA: Diagnosis not present

## 2019-04-25 DIAGNOSIS — Z992 Dependence on renal dialysis: Secondary | ICD-10-CM | POA: Diagnosis not present

## 2019-04-25 DIAGNOSIS — K7581 Nonalcoholic steatohepatitis (NASH): Secondary | ICD-10-CM | POA: Diagnosis not present

## 2019-04-25 DIAGNOSIS — I2510 Atherosclerotic heart disease of native coronary artery without angina pectoris: Secondary | ICD-10-CM | POA: Diagnosis not present

## 2019-04-25 DIAGNOSIS — T82534D Leakage of infusion catheter, subsequent encounter: Secondary | ICD-10-CM | POA: Diagnosis not present

## 2019-04-25 DIAGNOSIS — K922 Gastrointestinal hemorrhage, unspecified: Secondary | ICD-10-CM | POA: Diagnosis not present

## 2019-04-25 LAB — CULTURE, BLOOD (ROUTINE X 2)
Culture: NO GROWTH
Culture: NO GROWTH
Special Requests: ADEQUATE

## 2019-04-26 DIAGNOSIS — N186 End stage renal disease: Secondary | ICD-10-CM | POA: Diagnosis not present

## 2019-04-26 DIAGNOSIS — Z992 Dependence on renal dialysis: Secondary | ICD-10-CM | POA: Diagnosis not present

## 2019-04-27 DIAGNOSIS — Z992 Dependence on renal dialysis: Secondary | ICD-10-CM | POA: Diagnosis not present

## 2019-04-27 DIAGNOSIS — N186 End stage renal disease: Secondary | ICD-10-CM | POA: Diagnosis not present

## 2019-04-28 DIAGNOSIS — Z992 Dependence on renal dialysis: Secondary | ICD-10-CM | POA: Diagnosis not present

## 2019-04-28 DIAGNOSIS — N186 End stage renal disease: Secondary | ICD-10-CM | POA: Diagnosis not present

## 2019-04-29 DIAGNOSIS — Z992 Dependence on renal dialysis: Secondary | ICD-10-CM | POA: Diagnosis not present

## 2019-04-29 DIAGNOSIS — N186 End stage renal disease: Secondary | ICD-10-CM | POA: Diagnosis not present

## 2019-04-30 ENCOUNTER — Telehealth: Payer: Self-pay | Admitting: Internal Medicine

## 2019-04-30 DIAGNOSIS — Z992 Dependence on renal dialysis: Secondary | ICD-10-CM | POA: Diagnosis not present

## 2019-04-30 DIAGNOSIS — N186 End stage renal disease: Secondary | ICD-10-CM | POA: Diagnosis not present

## 2019-04-30 NOTE — Telephone Encounter (Signed)
04/30/2019 12:30pm:   TC to PCG daughter [name] ([phone]) to schedule f/u Palliative Care visit for this Thursday, Sept 3rd, if desired.  I left my contact information.  Violeta Gelinas NP-C [phone] [facility] ([facility])

## 2019-05-02 DIAGNOSIS — Z992 Dependence on renal dialysis: Secondary | ICD-10-CM | POA: Diagnosis not present

## 2019-05-02 DIAGNOSIS — N186 End stage renal disease: Secondary | ICD-10-CM | POA: Diagnosis not present

## 2019-05-03 DIAGNOSIS — I959 Hypotension, unspecified: Secondary | ICD-10-CM | POA: Diagnosis not present

## 2019-05-03 DIAGNOSIS — I864 Gastric varices: Secondary | ICD-10-CM | POA: Diagnosis not present

## 2019-05-03 DIAGNOSIS — D631 Anemia in chronic kidney disease: Secondary | ICD-10-CM | POA: Diagnosis not present

## 2019-05-03 DIAGNOSIS — Z992 Dependence on renal dialysis: Secondary | ICD-10-CM | POA: Diagnosis not present

## 2019-05-03 DIAGNOSIS — Z681 Body mass index (BMI) 19 or less, adult: Secondary | ICD-10-CM | POA: Diagnosis not present

## 2019-05-03 DIAGNOSIS — N186 End stage renal disease: Secondary | ICD-10-CM | POA: Diagnosis not present

## 2019-05-03 DIAGNOSIS — D500 Iron deficiency anemia secondary to blood loss (chronic): Secondary | ICD-10-CM | POA: Diagnosis not present

## 2019-05-03 DIAGNOSIS — R571 Hypovolemic shock: Secondary | ICD-10-CM | POA: Diagnosis not present

## 2019-05-03 DIAGNOSIS — E43 Unspecified severe protein-calorie malnutrition: Secondary | ICD-10-CM | POA: Diagnosis not present

## 2019-05-03 DIAGNOSIS — E871 Hypo-osmolality and hyponatremia: Secondary | ICD-10-CM | POA: Diagnosis not present

## 2019-05-03 DIAGNOSIS — K219 Gastro-esophageal reflux disease without esophagitis: Secondary | ICD-10-CM | POA: Diagnosis not present

## 2019-05-03 DIAGNOSIS — I5032 Chronic diastolic (congestive) heart failure: Secondary | ICD-10-CM | POA: Diagnosis not present

## 2019-05-03 DIAGNOSIS — A419 Sepsis, unspecified organism: Secondary | ICD-10-CM | POA: Diagnosis not present

## 2019-05-03 DIAGNOSIS — K7460 Unspecified cirrhosis of liver: Secondary | ICD-10-CM | POA: Diagnosis not present

## 2019-05-03 DIAGNOSIS — R531 Weakness: Secondary | ICD-10-CM | POA: Diagnosis not present

## 2019-05-03 DIAGNOSIS — R262 Difficulty in walking, not elsewhere classified: Secondary | ICD-10-CM | POA: Diagnosis not present

## 2019-05-03 DIAGNOSIS — Z743 Need for continuous supervision: Secondary | ICD-10-CM | POA: Diagnosis not present

## 2019-05-03 DIAGNOSIS — I132 Hypertensive heart and chronic kidney disease with heart failure and with stage 5 chronic kidney disease, or end stage renal disease: Secondary | ICD-10-CM | POA: Diagnosis not present

## 2019-05-04 DIAGNOSIS — N186 End stage renal disease: Secondary | ICD-10-CM | POA: Diagnosis not present

## 2019-05-04 DIAGNOSIS — Z992 Dependence on renal dialysis: Secondary | ICD-10-CM | POA: Diagnosis not present

## 2019-05-06 DIAGNOSIS — Z992 Dependence on renal dialysis: Secondary | ICD-10-CM | POA: Diagnosis not present

## 2019-05-06 DIAGNOSIS — N186 End stage renal disease: Secondary | ICD-10-CM | POA: Diagnosis not present

## 2019-05-07 ENCOUNTER — Telehealth: Payer: Self-pay | Admitting: Internal Medicine

## 2019-05-07 DIAGNOSIS — Z992 Dependence on renal dialysis: Secondary | ICD-10-CM | POA: Diagnosis not present

## 2019-05-07 DIAGNOSIS — N186 End stage renal disease: Secondary | ICD-10-CM | POA: Diagnosis not present

## 2019-05-10 ENCOUNTER — Ambulatory Visit: Payer: Medicare HMO | Admitting: "Endocrinology

## 2019-05-23 DIAGNOSIS — K529 Noninfective gastroenteritis and colitis, unspecified: Secondary | ICD-10-CM | POA: Diagnosis not present

## 2019-05-31 NOTE — Telephone Encounter (Signed)
Patient's daughter [name] called to inform me that patient died during her beach trip, the morning of 05-18-19.  Violeta Gelinas NP-C [phone]

## 2019-05-31 DEATH — deceased

## 2019-09-07 ENCOUNTER — Telehealth: Payer: Self-pay | Admitting: Nurse Practitioner

## 2019-09-07 NOTE — Telephone Encounter (Signed)
Noted.   [name] 

## 2019-09-07 NOTE — Telephone Encounter (Signed)
Patient passed away in September, daughter wanted me to advise [name] and her nurse.

## 2019-10-02 ENCOUNTER — Ambulatory Visit: Payer: Medicare HMO | Admitting: Nurse Practitioner

## 2020-12-06 IMAGING — CR PORTABLE CHEST - 1 VIEW
1 series · 1 of 1 positions shown · non-contrast
Comparison: Radiographs July 12, 2013.

CLINICAL DATA: Nausea, vomiting.

EXAM:
PORTABLE CHEST 1 VIEW

[portable]
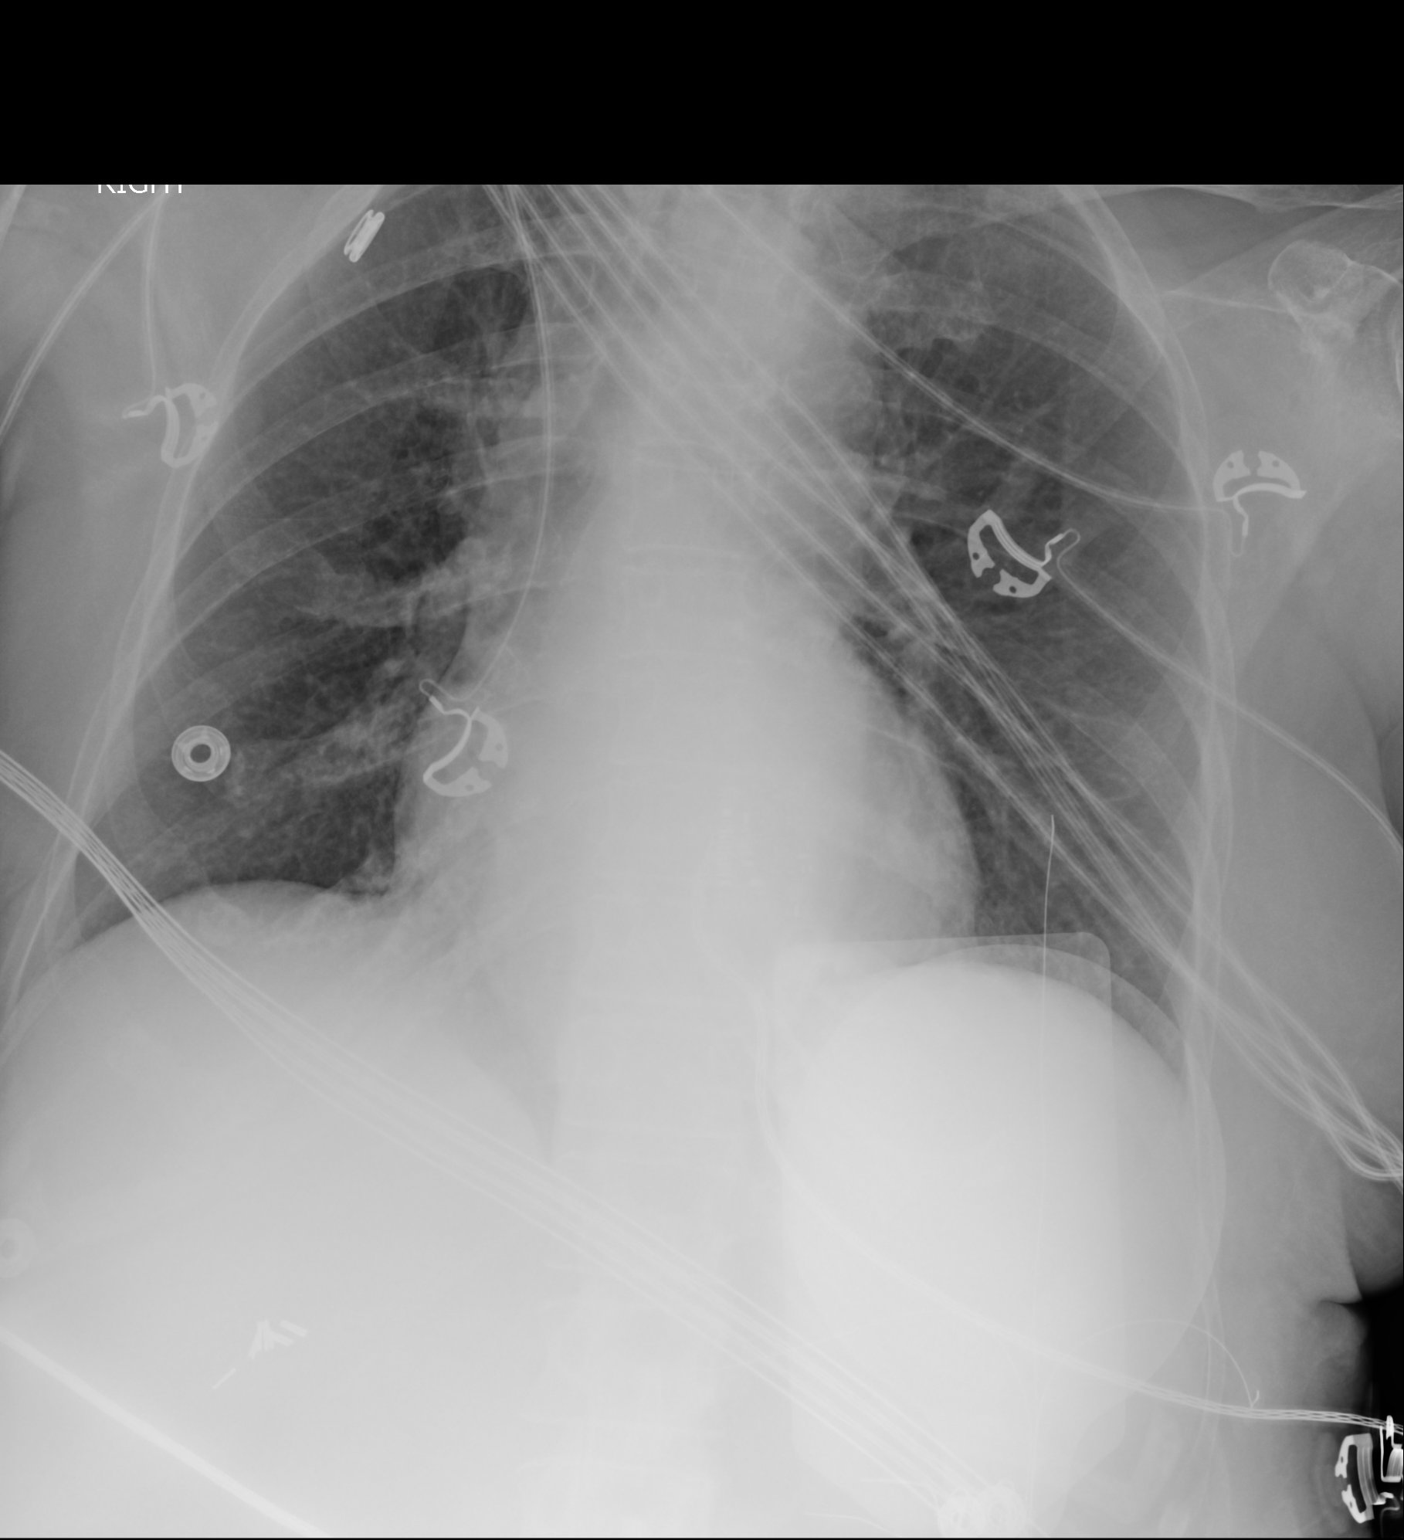

[1 of 1 positions shown; findings below may reference images not displayed]

FINDINGS: The heart size and mediastinal contours are within normal limits.
Both lungs are clear. No pneumothorax or pleural effusion is noted.
The visualized skeletal structures are unremarkable.
IMPRESSION: No active disease.

## 2021-02-10 IMAGING — DX PORTABLE CHEST - 1 VIEW
1 series · 1 of 1 positions shown · non-contrast
Comparison: Radiograph January 10, 2019.

CLINICAL DATA: Shortness of breath, cough.

EXAM:
PORTABLE CHEST 1 VIEW

[chest ap]
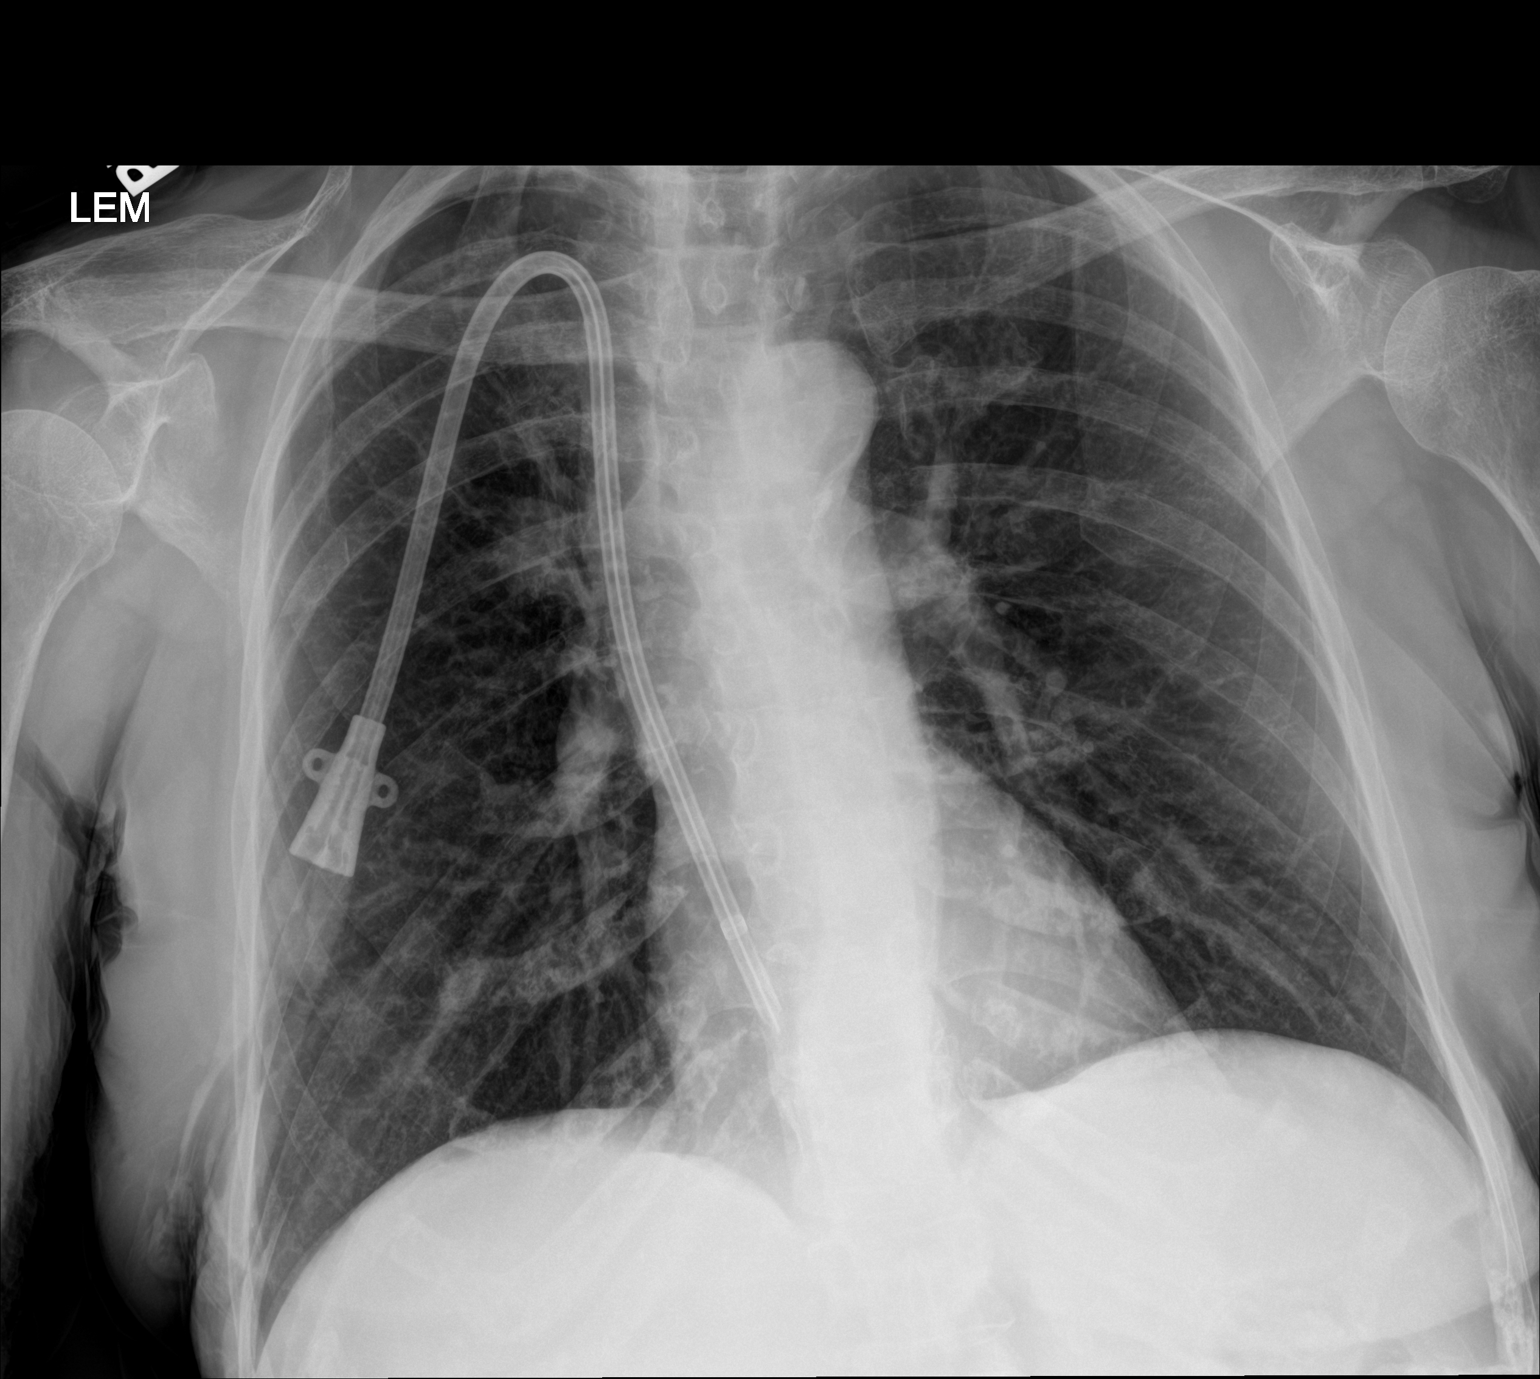

[1 of 1 positions shown; findings below may reference images not displayed]

FINDINGS: The heart size and mediastinal contours are within normal limits.
Both lungs are clear. No pneumothorax or pleural effusion is noted.
Right internal jugular dialysis catheter is noted with distal tip in
expected position of right atrium. Atherosclerosis of abdominal
aorta is noted. The visualized skeletal structures are unremarkable.
IMPRESSION: No active disease.

Aortic Atherosclerosis ([id number]).

## 2021-03-01 IMAGING — DX ABDOMEN - 1 VIEW
1 series · 1 of 1 positions shown · non-contrast
Comparison: March 30, 2019

CLINICAL DATA: Abdominal distention

EXAM:
ABDOMEN - 1 VIEW

[abdomen supine]
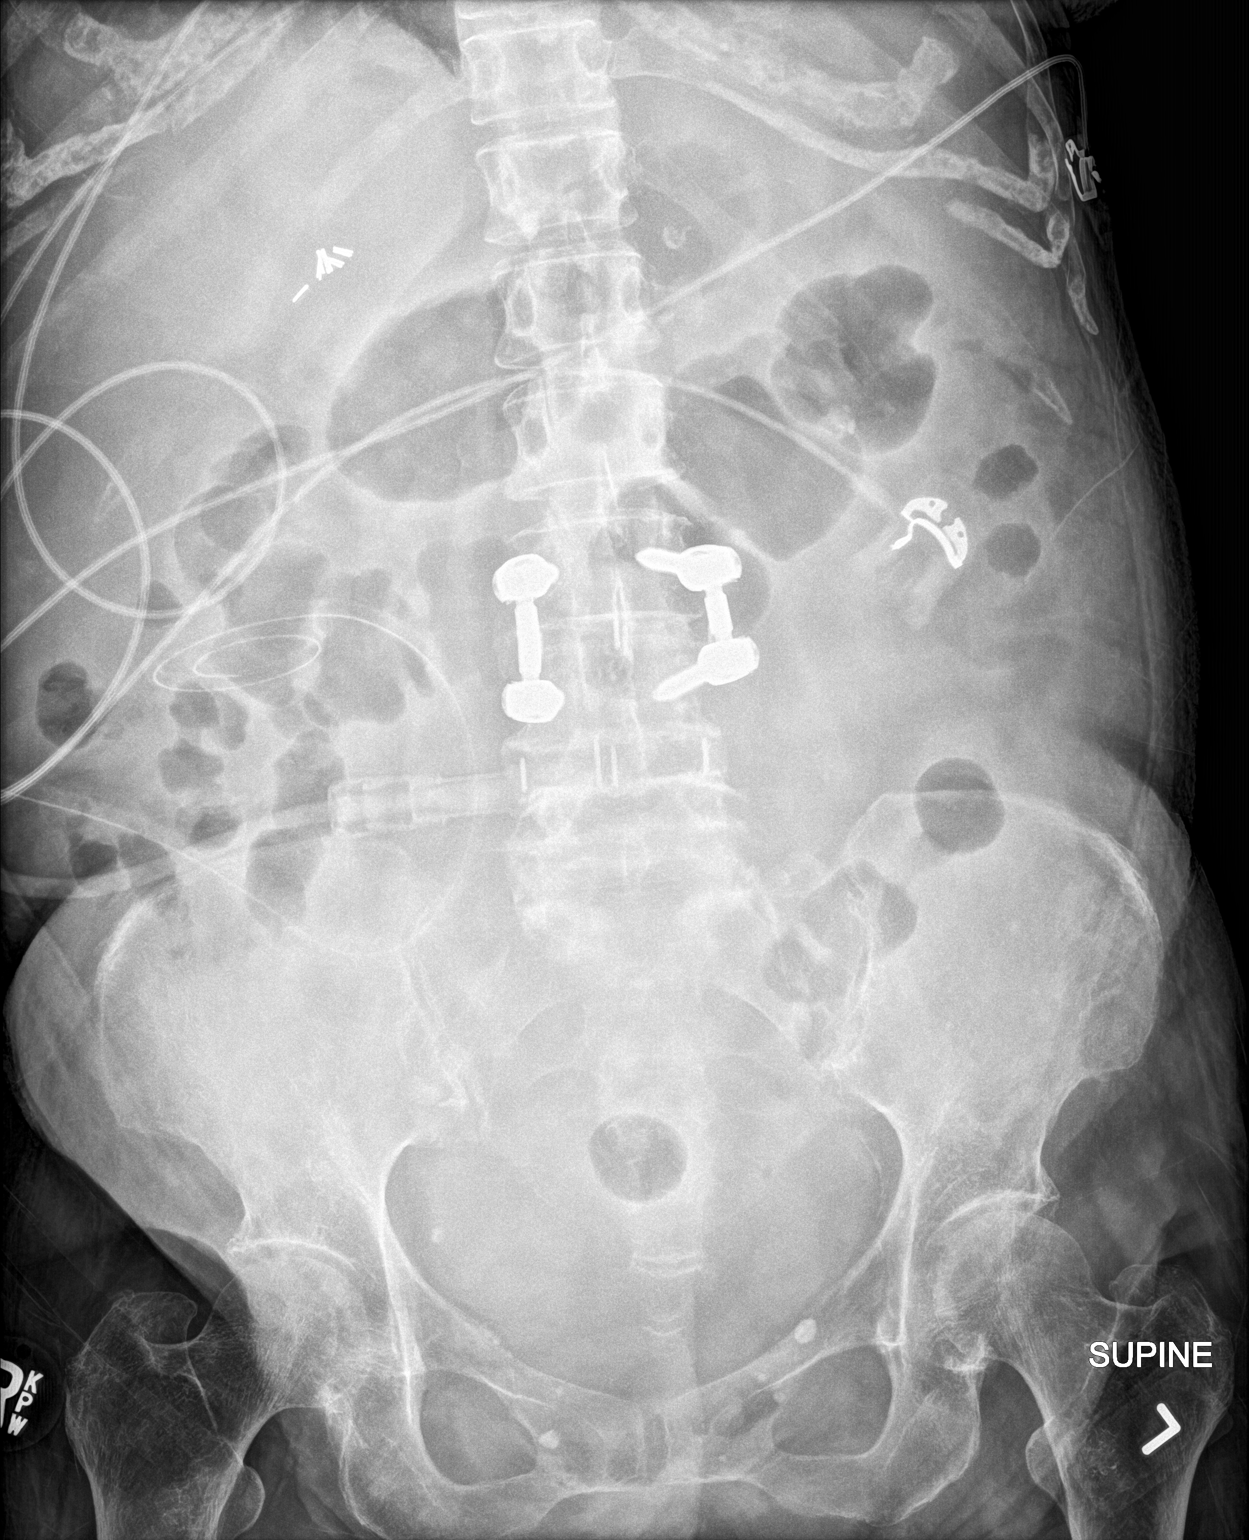

[1 of 1 positions shown; findings below may reference images not displayed]

FINDINGS: Peritoneal dialysis catheter in the right abdomen. Prior
cholecystectomy. There is a non obstructive bowel gas pattern. No
supine evidence of free air. No organomegaly or suspicious
calcification. No acute bony abnormality.
IMPRESSION: No acute findings.
